# Patient Record
Sex: Female | Born: 1935 | Race: White | Hispanic: No | Smoking: Never smoker
Health system: Southern US, Community
[De-identification: ages and names within clinical notes are randomized; demographics above are authoritative.]

## PROBLEM LIST (undated history)

## (undated) DIAGNOSIS — K219 Gastro-esophageal reflux disease without esophagitis: Secondary | ICD-10-CM

## (undated) DIAGNOSIS — C9110 Chronic lymphocytic leukemia of B-cell type not having achieved remission: Secondary | ICD-10-CM

## (undated) DIAGNOSIS — I10 Essential (primary) hypertension: Secondary | ICD-10-CM

## (undated) DIAGNOSIS — M1990 Unspecified osteoarthritis, unspecified site: Secondary | ICD-10-CM

## (undated) DIAGNOSIS — I509 Heart failure, unspecified: Secondary | ICD-10-CM

## (undated) DIAGNOSIS — N200 Calculus of kidney: Secondary | ICD-10-CM

## (undated) DIAGNOSIS — G2581 Restless legs syndrome: Secondary | ICD-10-CM

## (undated) DIAGNOSIS — R609 Edema, unspecified: Secondary | ICD-10-CM

## (undated) DIAGNOSIS — M797 Fibromyalgia: Secondary | ICD-10-CM

## (undated) HISTORY — PX: TONSILLECTOMY: SUR1361

## (undated) HISTORY — DX: Chronic lymphocytic leukemia of B-cell type not having achieved remission: C91.10

## (undated) HISTORY — PX: EYE SURGERY: SHX253

## (undated) HISTORY — PX: ABDOMINAL HYSTERECTOMY: SHX81

## (undated) HISTORY — DX: Heart failure, unspecified: I50.9

## (undated) HISTORY — PX: LITHOTRIPSY: SUR834

---

## 2004-12-06 ENCOUNTER — Ambulatory Visit: Payer: Self-pay

## 2005-04-25 ENCOUNTER — Ambulatory Visit: Payer: Self-pay | Admitting: Urology

## 2005-04-25 ENCOUNTER — Other Ambulatory Visit: Payer: Self-pay

## 2005-05-11 ENCOUNTER — Ambulatory Visit: Payer: Self-pay | Admitting: Urology

## 2007-11-21 ENCOUNTER — Ambulatory Visit: Payer: Self-pay | Admitting: Family Medicine

## 2008-03-26 ENCOUNTER — Other Ambulatory Visit: Payer: Self-pay

## 2008-03-26 ENCOUNTER — Emergency Department: Payer: Self-pay | Admitting: Emergency Medicine

## 2010-08-02 ENCOUNTER — Ambulatory Visit: Payer: Self-pay | Admitting: Family Medicine

## 2012-04-09 ENCOUNTER — Ambulatory Visit: Payer: Self-pay | Admitting: Ophthalmology

## 2012-04-09 DIAGNOSIS — I10 Essential (primary) hypertension: Secondary | ICD-10-CM

## 2012-04-22 ENCOUNTER — Ambulatory Visit: Payer: Self-pay | Admitting: Ophthalmology

## 2014-03-06 ENCOUNTER — Emergency Department: Payer: Self-pay | Admitting: Emergency Medicine

## 2014-03-06 LAB — BASIC METABOLIC PANEL
Anion Gap: 2 — ABNORMAL LOW (ref 7–16)
BUN: 14 mg/dL (ref 7–18)
CHLORIDE: 107 mmol/L (ref 98–107)
Calcium, Total: 8.9 mg/dL (ref 8.5–10.1)
Co2: 29 mmol/L (ref 21–32)
Creatinine: 0.75 mg/dL (ref 0.60–1.30)
EGFR (African American): >60
Glucose: 103 mg/dL — ABNORMAL HIGH (ref 65–99)
OSMOLALITY: 276 (ref 275–301)
Potassium: 3.6 mmol/L (ref 3.5–5.1)
SODIUM: 138 mmol/L (ref 136–145)

## 2014-03-06 LAB — CBC WITH DIFFERENTIAL/PLATELET
Basophil #: 0.1 10*3/uL (ref 0.0–0.1)
Basophil %: 0.4 %
Eosinophil #: 0.4 10*3/uL (ref 0.0–0.7)
Eosinophil %: 2.8 %
HCT: 42.9 % (ref 35.0–47.0)
HGB: 14.1 g/dL (ref 12.0–16.0)
LYMPHS PCT: 42.1 %
Lymphocyte #: 6.6 10*3/uL — ABNORMAL HIGH (ref 1.0–3.6)
MCH: 29.1 pg (ref 26.0–34.0)
MCHC: 32.9 g/dL (ref 32.0–36.0)
MCV: 88 fL (ref 80–100)
Monocyte #: 1.3 x10 3/mm — ABNORMAL HIGH (ref 0.2–0.9)
Monocyte %: 8.5 %
Neutrophil #: 7.3 10*3/uL — ABNORMAL HIGH (ref 1.4–6.5)
Neutrophil %: 46.2 %
Platelet: 240 10*3/uL (ref 150–440)
RBC: 4.86 10*6/uL (ref 3.80–5.20)
RDW: 12.9 % (ref 11.5–14.5)
WBC: 15.8 10*3/uL — AB (ref 3.6–11.0)

## 2014-03-06 LAB — TROPONIN I

## 2014-03-07 LAB — URINALYSIS, COMPLETE
BACTERIA: NONE SEEN
Bilirubin,UR: NEGATIVE
Blood: NEGATIVE
GLUCOSE, UR: NEGATIVE mg/dL (ref 0–75)
Ketone: NEGATIVE
Leukocyte Esterase: NEGATIVE
Nitrite: NEGATIVE
PH: 7 (ref 4.5–8.0)
PROTEIN: NEGATIVE
Specific Gravity: 1.008 (ref 1.003–1.030)
Squamous Epithelial: NONE SEEN
WBC UR: <1 /HPF (ref 0–5)

## 2015-04-11 NOTE — Op Note (Signed)
PATIENT NAME:  Lindsay [name], Lindsay [name] MR#:  [id number] DATE OF BIRTH:  1934-10-09  DATE OF PROCEDURE:  04/22/2012  PREOPERATIVE DIAGNOSIS:  Cataract, right eye.   POSTOPERATIVE DIAGNOSIS:  Cataract, right eye.  PROCEDURE PERFORMED:  Extracapsular cataract extraction using phacoemulsification with placement of an Alcon SN6CWS 21.5-diopter posterior chamber lens, serial # [id number].  SURGEON:  Loura Back. Tymar Polyak, MD  ASSISTANT:  None.  ANESTHESIA:  4% lidocaine and 0.75% Marcaine in a 50/50 mixture with 10 units/mL of Hylenex added, given as a peribulbar.  ANESTHESIOLOGIST:  Dr. Kayleen Memos   COMPLICATIONS:  None.  ESTIMATED BLOOD LOSS:  Less than 1 mL.  DESCRIPTION OF PROCEDURE:  The patient was brought to the operating room and given a peribulbar block.  The patient was then prepped and draped in the usual fashion.  The vertical rectus muscles were imbricated using 5-0 silk sutures.  These sutures were then clamped to the sterile drapes as bridle sutures.  A limbal peritomy was performed extending two clock hours and hemostasis was obtained with cautery.  A partial thickness scleral groove was made at the surgical limbus and dissected anteriorly in a lamellar dissection using an Alcon crescent knife.  The anterior chamber was entered superonasally with a Superblade and through the lamellar dissection with a 2.6 mm keratome.  DisCoVisc was used to replace the aqueous and a continuous tear capsulorrhexis was carried out.  Hydrodissection and hydrodelineation were carried out with balanced salt and a 27 gauge canula.  The nucleus was rotated to confirm the effectiveness of the hydrodissection.  Phacoemulsification was carried out using a divide-and-conquer technique.  Total ultrasound time was 2 minutes and 39.6 seconds with an average power of 20.2 percent. CDE 60.31.  Irrigation/aspiration was used to remove the residual cortex.  DisCoVisc was used to inflate the capsule and the internal  incision was enlarged to 3 mm with the crescent knife.  The intraocular lens was folded and inserted into the capsular bag using the AcrySert delivery system.  Irrigation/aspiration was used to remove the residual DisCoVisc.  Miostat was injected into the anterior chamber through the paracentesis track to inflate the anterior chamber and induce miosis.  The wound was checked for leaks and none were found. The conjunctiva was closed with cautery and the bridle sutures were removed.  Two drops of 0.3% Vigamox were placed on the eye.   An eye shield was placed on the eye.  The patient was discharged to the recovery room in good condition.  ____________________________ Loura Back Teegan Guinther, MD sad:cms D: 04/22/2012 13:42:56 ET T: 04/22/2012 13:51:04 ET JOB#: [id number]  cc: Remo Lipps A. Monroe Qin, MD, <Dictator> Martie Lee MD ELECTRONICALLY SIGNED 04/26/2012 9:03

## 2016-02-04 ENCOUNTER — Encounter: Payer: Self-pay | Admitting: *Deleted

## 2016-02-07 ENCOUNTER — Ambulatory Visit: Payer: Medicare Other | Admitting: Anesthesiology

## 2016-02-07 ENCOUNTER — Encounter
Admission: RE | Disposition: A | Discharge status: Home / Self Care | Payer: Self-pay | Source: Ambulatory Visit | Attending: Ophthalmology

## 2016-02-07 ENCOUNTER — Encounter: Payer: Self-pay | Admitting: Anesthesiology

## 2016-02-07 ENCOUNTER — Ambulatory Visit
Admission: RE | Admit: 2016-02-07 | Discharge: 2016-02-07 | Disposition: A | Discharge status: Home / Self Care | Payer: Medicare Other | Source: Ambulatory Visit | Attending: Ophthalmology | Admitting: Ophthalmology

## 2016-02-07 DIAGNOSIS — K219 Gastro-esophageal reflux disease without esophagitis: Secondary | ICD-10-CM | POA: Diagnosis not present

## 2016-02-07 DIAGNOSIS — M1990 Unspecified osteoarthritis, unspecified site: Secondary | ICD-10-CM | POA: Diagnosis not present

## 2016-02-07 DIAGNOSIS — Z90710 Acquired absence of both cervix and uterus: Secondary | ICD-10-CM | POA: Insufficient documentation

## 2016-02-07 DIAGNOSIS — H2512 Age-related nuclear cataract, left eye: Secondary | ICD-10-CM | POA: Insufficient documentation

## 2016-02-07 DIAGNOSIS — Z9841 Cataract extraction status, right eye: Secondary | ICD-10-CM | POA: Diagnosis not present

## 2016-02-07 DIAGNOSIS — I10 Essential (primary) hypertension: Secondary | ICD-10-CM | POA: Insufficient documentation

## 2016-02-07 DIAGNOSIS — M25473 Effusion, unspecified ankle: Secondary | ICD-10-CM | POA: Insufficient documentation

## 2016-02-07 DIAGNOSIS — Z87442 Personal history of urinary calculi: Secondary | ICD-10-CM | POA: Insufficient documentation

## 2016-02-07 DIAGNOSIS — G2581 Restless legs syndrome: Secondary | ICD-10-CM | POA: Insufficient documentation

## 2016-02-07 DIAGNOSIS — Z885 Allergy status to narcotic agent status: Secondary | ICD-10-CM | POA: Insufficient documentation

## 2016-02-07 DIAGNOSIS — M797 Fibromyalgia: Secondary | ICD-10-CM | POA: Insufficient documentation

## 2016-02-07 HISTORY — DX: Essential (primary) hypertension: I10

## 2016-02-07 HISTORY — DX: Gastro-esophageal reflux disease without esophagitis: K21.9

## 2016-02-07 HISTORY — DX: Fibromyalgia: M79.7

## 2016-02-07 HISTORY — DX: Unspecified osteoarthritis, unspecified site: M19.90

## 2016-02-07 HISTORY — PX: CATARACT EXTRACTION W/PHACO: SHX586

## 2016-02-07 HISTORY — DX: Edema, unspecified: R60.9

## 2016-02-07 HISTORY — DX: Restless legs syndrome: G25.81

## 2016-02-07 HISTORY — DX: Calculus of kidney: N20.0

## 2016-02-07 SURGERY — PHACOEMULSIFICATION, CATARACT, WITH IOL INSERTION
Anesthesia: Monitor Anesthesia Care | Site: Eye | Laterality: Left | Wound class: Clean

## 2016-02-07 MED ORDER — BSS IO SOLN
INTRAOCULAR | Status: DC | PRN
  Administered 2016-02-07: .1 mL via OPHTHALMIC

## 2016-02-07 MED ORDER — SODIUM CHLORIDE 0.9 % IV SOLN
INTRAVENOUS | Status: DC
  Administered 2016-02-07 (×2): via INTRAVENOUS

## 2016-02-07 MED ORDER — MOXIFLOXACIN HCL 0.5 % OP SOLN
1.0000 [drp] | OPHTHALMIC | Status: AC | PRN
  Administered 2016-02-07 (×3): 1 [drp] via OPHTHALMIC

## 2016-02-07 MED ORDER — LIDOCAINE HCL (PF) 4 % IJ SOLN
INTRAMUSCULAR | Status: AC
  Filled 2016-02-07: qty 5

## 2016-02-07 MED ORDER — EPINEPHRINE HCL 1 MG/ML IJ SOLN
INTRAMUSCULAR | Status: AC
  Filled 2016-02-07: qty 2

## 2016-02-07 MED ORDER — BUPIVACAINE HCL (PF) 0.75 % IJ SOLN
INTRAMUSCULAR | Status: AC
  Filled 2016-02-07: qty 10

## 2016-02-07 MED ORDER — CEFUROXIME OPHTHALMIC INJECTION 1 MG/0.1 ML
INJECTION | OPHTHALMIC | Status: AC
  Filled 2016-02-07: qty 0.1

## 2016-02-07 MED ORDER — TETRACAINE HCL 0.5 % OP SOLN
OPHTHALMIC | Status: DC | PRN
  Administered 2016-02-07: 1 [drp] via OPHTHALMIC

## 2016-02-07 MED ORDER — ALFENTANIL 500 MCG/ML IJ INJ
INJECTION | INTRAMUSCULAR | Status: DC | PRN
  Administered 2016-02-07: 600 ug via INTRAVENOUS

## 2016-02-07 MED ORDER — PHENYLEPHRINE HCL 10 % OP SOLN
1.0000 [drp] | OPHTHALMIC | Status: AC | PRN
  Administered 2016-02-07 (×4): 1 [drp] via OPHTHALMIC

## 2016-02-07 MED ORDER — NA CHONDROIT SULF-NA HYALURON 40-17 MG/ML IO SOLN
INTRAOCULAR | Status: AC
  Filled 2016-02-07: qty 1

## 2016-02-07 MED ORDER — NA CHONDROIT SULF-NA HYALURON 40-17 MG/ML IO SOLN
INTRAOCULAR | Status: DC | PRN
  Administered 2016-02-07: 1 mL via INTRAOCULAR

## 2016-02-07 MED ORDER — EPINEPHRINE HCL 1 MG/ML IJ SOLN
INTRAMUSCULAR | Status: DC | PRN
  Administered 2016-02-07: 1 mL via OPHTHALMIC

## 2016-02-07 MED ORDER — MOXIFLOXACIN HCL 0.5 % OP SOLN
OPHTHALMIC | Status: DC | PRN
  Administered 2016-02-07: 1 [drp] via OPHTHALMIC

## 2016-02-07 MED ORDER — HYALURONIDASE HUMAN 150 UNIT/ML IJ SOLN
INTRAMUSCULAR | Status: AC
  Filled 2016-02-07: qty 1

## 2016-02-07 MED ORDER — CARBACHOL 0.01 % IO SOLN
INTRAOCULAR | Status: DC | PRN
  Administered 2016-02-07: .5 mL via INTRAOCULAR

## 2016-02-07 MED ORDER — MIDAZOLAM HCL 2 MG/2ML IJ SOLN
INTRAMUSCULAR | Status: DC | PRN
  Administered 2016-02-07: 1 mg via INTRAVENOUS

## 2016-02-07 MED ORDER — CEFUROXIME OPHTHALMIC INJECTION 1 MG/0.1 ML
INJECTION | OPHTHALMIC | Status: DC | PRN
  Administered 2016-02-07: .1 mL via INTRACAMERAL

## 2016-02-07 MED ORDER — CYCLOPENTOLATE HCL 2 % OP SOLN
1.0000 [drp] | OPHTHALMIC | Status: AC | PRN
  Administered 2016-02-07 (×4): 1 [drp] via OPHTHALMIC

## 2016-02-07 MED ORDER — LIDOCAINE HCL (PF) 4 % IJ SOLN
INTRAMUSCULAR | Status: DC | PRN
  Administered 2016-02-07: 4 mL via OPHTHALMIC

## 2016-02-07 MED ORDER — TETRACAINE HCL 0.5 % OP SOLN
OPHTHALMIC | Status: AC
  Filled 2016-02-07: qty 2

## 2016-02-07 SURGICAL SUPPLY — 29 items
CANNULA ANT/CHMB 27GA (MISCELLANEOUS) ×2 IMPLANT
CORD BIP STRL DISP 12FT (MISCELLANEOUS) ×2 IMPLANT
CUP MEDICINE 2OZ PLAST GRAD ST (MISCELLANEOUS) ×2 IMPLANT
DRAPE XRAY CASSETTE 23X24 (DRAPES) ×2 IMPLANT
ERASER HMR WETFIELD 18G (MISCELLANEOUS) ×2 IMPLANT
GLOVE BIO SURGEON STRL SZ8 (GLOVE) ×2 IMPLANT
GLOVE SURG LX 6.5 MICRO (GLOVE) ×1
GLOVE SURG LX 8.0 MICRO (GLOVE) ×1
GLOVE SURG LX STRL 6.5 MICRO (GLOVE) ×1 IMPLANT
GLOVE SURG LX STRL 8.0 MICRO (GLOVE) ×1 IMPLANT
GOWN STRL REUS W/ TWL LRG LVL3 (GOWN DISPOSABLE) ×1 IMPLANT
GOWN STRL REUS W/ TWL XL LVL3 (GOWN DISPOSABLE) ×1 IMPLANT
GOWN STRL REUS W/TWL LRG LVL3 (GOWN DISPOSABLE) ×1
GOWN STRL REUS W/TWL XL LVL3 (GOWN DISPOSABLE) ×1
LENS IOL ACRYSOF IQ 21.0 (Intraocular Lens) ×2 IMPLANT
PACK CATARACT (MISCELLANEOUS) ×2 IMPLANT
PACK CATARACT DINGLEDEIN LX (MISCELLANEOUS) ×2 IMPLANT
PACK EYE AFTER SURG (MISCELLANEOUS) ×2 IMPLANT
SHLD EYE VISITEC  UNIV (MISCELLANEOUS) ×2 IMPLANT
SOL BSS BAG (MISCELLANEOUS) ×2
SOL PREP PVP 2OZ (MISCELLANEOUS) ×2
SOLUTION BSS BAG (MISCELLANEOUS) ×1 IMPLANT
SOLUTION PREP PVP 2OZ (MISCELLANEOUS) ×1 IMPLANT
SUT SILK 5-0 (SUTURE) ×2 IMPLANT
SYR 3ML LL SCALE MARK (SYRINGE) ×2 IMPLANT
SYR 5ML LL (SYRINGE) ×2 IMPLANT
SYR TB 1ML 27GX1/2 LL (SYRINGE) ×2 IMPLANT
WATER STERILE IRR 1000ML POUR (IV SOLUTION) ×2 IMPLANT
WIPE NON LINTING 3.25X3.25 (MISCELLANEOUS) ×2 IMPLANT

## 2016-02-07 NOTE — H&P (Signed)
See scanned note.

## 2016-02-07 NOTE — Transfer of Care (Signed)
Immediate Anesthesia Transfer of Care Note  Patient: Lindsay [name]  Procedure(s) Performed: Procedure(s) with comments: CATARACT EXTRACTION PHACO AND INTRAOCULAR LENS PLACEMENT (IOC) (Left) - [place] 01:33 AP% 24.7 CDE 39.57 fluid pack lot # [id number]  Patient Location: PACU  Anesthesia Type:MAC  Level of Consciousness: awake  Airway & Oxygen Therapy: Patient Spontanous Breathing  Post-op Assessment: Report given to RN  Post vital signs: Reviewed and stable  Last Vitals:  Filed Vitals:   02/07/16 0737  BP: 178/56  Pulse: 70  Temp: 35.8 C  Resp: 16    Complications: No apparent anesthesia complications

## 2016-02-07 NOTE — Interval H&P Note (Signed)
History and Physical Interval Note:  02/07/2016 8:58 AM  Lindsay [name]  has presented today for surgery, with the diagnosis of cataract  The various methods of treatment have been discussed with the patient and family. After consideration of risks, benefits and other options for treatment, the patient has consented to  Procedure(s): CATARACT EXTRACTION PHACO AND INTRAOCULAR LENS PLACEMENT ([place]) (Left) as a surgical intervention .  The patient's history has been reviewed, patient examined, no change in status, stable for surgery.  I have reviewed the patient's chart and labs.  Questions were answered to the patient's satisfaction.     Aldridge Krzyzanowski

## 2016-02-07 NOTE — Anesthesia Preprocedure Evaluation (Signed)
Anesthesia Evaluation  Patient identified by MRN, date of birth, ID band Patient awake    Reviewed: Allergy & Precautions, H&P , NPO status , Patient's Chart, lab work & pertinent test results  History of Anesthesia Complications Negative for: history of anesthetic complications  Airway Mallampati: III  TM Distance: >3 FB Neck ROM: full    Dental  (+) Poor Dentition   Pulmonary neg pulmonary ROS, neg shortness of breath,    Pulmonary exam normal breath sounds clear to auscultation       Cardiovascular Exercise Tolerance: Good hypertension, (-) angina(-) DOE Normal cardiovascular exam Rhythm:regular Rate:Normal     Neuro/Psych  Neuromuscular disease negative psych ROS   GI/Hepatic Neg liver ROS, GERD  Controlled,  Endo/Other  negative endocrine ROS  Renal/GU Renal disease  negative genitourinary   Musculoskeletal  (+) Arthritis , Fibromyalgia -  Abdominal   Peds  Hematology negative hematology ROS (+)   Anesthesia Other Findings Past Medical History:   Hypertension                                                 GERD (gastroesophageal reflux disease)                       Kidney stones                                                Arthritis                                                      Comment:RHEUMATOID   Fibromyalgia                                                 RLS (restless legs syndrome)                                 Edema                                                          Comment:MILD ANKLE OCCAS  Past Surgical History:   EYE SURGERY                                                   ABDOMINAL HYSTERECTOMY                                        TONSILLECTOMY  LITHOTRIPSY                                                  BMI    Body Mass Index   25.51 kg/m 2      Reproductive/Obstetrics negative OB ROS                              Anesthesia Physical Anesthesia Plan  ASA: III  Anesthesia Plan: MAC   Post-op Pain Management:    Induction:   Airway Management Planned:   Additional Equipment:   Intra-op Plan:   Post-operative Plan:   Informed Consent: I have reviewed the patients History and Physical, chart, labs and discussed the procedure including the risks, benefits and alternatives for the proposed anesthesia with the patient or authorized representative who has indicated his/her understanding and acceptance.   Dental Advisory Given  Plan Discussed with: Anesthesiologist, CRNA and Surgeon  Anesthesia Plan Comments:         Anesthesia Quick Evaluation

## 2016-02-07 NOTE — Anesthesia Postprocedure Evaluation (Signed)
Anesthesia Post Note  Patient: Lindsay [name]  Procedure(s) Performed: Procedure(s) (LRB): CATARACT EXTRACTION PHACO AND INTRAOCULAR LENS PLACEMENT (IOC) (Left)  Patient location during evaluation: Short Stay Anesthesia Type: MAC Level of consciousness: awake Pain management: pain level controlled Vital Signs Assessment: post-procedure vital signs reviewed and stable Respiratory status: spontaneous breathing Cardiovascular status: blood pressure returned to baseline    Last Vitals:  Filed Vitals:   02/07/16 0737  BP: 178/56  Pulse: 70  Temp: 35.8 C  Resp: 16    Last Pain: There were no vitals filed for this visit.               Buckner Malta

## 2016-02-07 NOTE — Anesthesia Procedure Notes (Addendum)
Procedure Name: MAC Date/Time: 02/07/2016 9:15 AM Performed by: Allean Found Pre-anesthesia Checklist: Patient identified, Emergency Drugs available, Suction available, Patient being monitored and Timeout performed Patient Re-evaluated:Patient Re-evaluated prior to inductionOxygen Delivery Method: Nasal cannula Intubation Type: IV induction

## 2016-02-07 NOTE — Discharge Instructions (Signed)
AMBULATORY SURGERY  DISCHARGE INSTRUCTIONS   1) The drugs that you were given will stay in your system until tomorrow so for the next 24 hours you should not:  A) Drive an automobile B) Make any legal decisions C) Drink any alcoholic beverage   2) You may resume regular meals tomorrow.  Today it is better to start with liquids and gradually work up to solid foods.  You may eat anything you prefer, but it is better to start with liquids, then soup and crackers, and gradually work up to solid foods.   3) Please notify your doctor immediately if you have any unusual bleeding, trouble breathing, redness and pain at the surgery site, drainage, fever, or pain not relieved by medication.    4) Additional Instructions:    Eye Surgery Discharge Instructions  Expect mild scratchy sensation or mild soreness. DO NOT RUB YOUR EYE!  The day of surgery:  Minimal physical activity, but bed rest is not required  No reading, computer work, or close hand work  No bending, lifting, or straining.  May watch TV  For 24 hours:  No driving, legal decisions, or alcoholic beverages  Safety precautions  Eat anything you prefer: It is better to start with liquids, then soup then solid foods.  _____ Eye patch should be worn until postoperative exam tomorrow.  ____ Solar shield eyeglasses should be worn for comfort in the sunlight/patch while sleeping  Resume all regular medications including aspirin or Coumadin if these were discontinued prior to surgery. You may shower, bathe, shave, or wash your hair. Tylenol may be taken for mild discomfort.  Call your doctor if you experience significant pain, nausea, or vomiting, fever > 101 or other signs of infection. [phone] or [phone] Specific instructions:  Follow-up Information    Follow up with Champagne Paletta, MD In 1 day.   Specialty:  Ophthalmology   Why:  February 20 at 10:50   Contact information:   [address]   [place] [address] [phone]         Please contact your physician with any problems or Same Day Surgery at [phone], Monday through Friday 6 am to 4 pm, or [place] at [facility] number at [phone].

## 2016-02-07 NOTE — Op Note (Signed)
Date of Surgery: 02/07/2016 Date of Dictation: 02/07/2016 9:44 AM Pre-operative Diagnosis:  Nuclear Sclerotic Cataract left Eye Post-operative Diagnosis: same Procedure performed: Extra-capsular Cataract Extraction (ECCE) with placement of a posterior chamber intraocular lens (IOL) left Eye IOL:  Implant Name Type Inv. Item Serial No. Manufacturer Lot No. LRB No. Used  LENS IOL ACRYSOF IQ 21.0 - [id number] Intraocular Lens LENS IOL ACRYSOF IQ 21.0 [id number] ALCON   Left 1   Anesthesia: 2% Lidocaine and 4% Marcaine in a 50/50 mixture with 10 unites/ml of Hylenex given as a peribulbar Anesthesiologist: Anesthesiologist: Andria Frames, MD CRNA: Allean Found, CRNA Complications: none Estimated Blood Loss: less than 1 ml  Description of procedure:  The patient was given anesthesia and sedation via intravenous access. The patient was then prepped and draped in the usual fashion. A 25-gauge needle was bent for initiating the capsulorhexis. A 5-0 silk suture was placed through the conjunctiva superior and inferiorly to serve as bridle sutures. Hemostasis was obtained at the superior limbus using an eraser cautery. A partial thickness groove was made at the anterior surgical limbus with a 64 Beaver blade and this was dissected anteriorly with an [employer]. The anterior chamber was entered at 10 o'clock with a 1.0 mm paracentesis knife and through the lamellar dissection with a 2.6 mm Alcon keratome. Epi-Shugarcaine 0.5 CC [9 cc BSS Plus (Alcon), 3 cc 4% preservative-free lidocaine (Hospira) and 4 cc 1:1000 preservative-free, bisulfite-free epinephrine] was injected into the anterior chamber via the paracentesis tract. Epi-Shugarcaine 0.5 CC [9 cc BSS Plus (Alcon), 3 cc 4% preservative-free lidocaine (Hospira) and 4 cc 1:1000 preservative-free, bisulfite-free epinephrine] was injected into the anterior chamber via the paracentesis tract. DiscoVisc was injected to replace the aqueous and a  continuous tear curvilinear capsulorhexis was performed using a bent 25-gauge needle.  Balance salt on a syringe was used to perform hydro-dissection and phacoemulsification was carried out using a divide and conquer technique. Procedure(s) with comments: CATARACT EXTRACTION PHACO AND INTRAOCULAR LENS PLACEMENT (IOC) (Left) - [place] 01:33 AP% 24.7 CDE 39.57 fluid pack lot # [id number]. Irrigation/aspiration was used to remove the residual cortex and the capsular bag was inflated with DiscoVisc. The intraocular lens was inserted into the capsular bag using a pre-loaded UltraSert Delivery System. Irrigation/aspiration was used to remove the residual DiscoVisc. The wound was inflated with balanced salt and checked for leaks. None were found. Miostat was injected via the paracentesis track and 0.1 ml of cefuroxime containing 1 mg of drug  was injected via the paracentesis track. The wound was checked for leaks again and none were found.   The bridal sutures were removed and two drops of Vigamox were placed on the eye. An eye shield was placed to protect the eye and the patient was discharged to the recovery area in good condition.   Gagan Dillion MD

## 2016-02-11 NOTE — Addendum Note (Signed)
Addendum  created 02/11/16 1118 by Andria Frames, MD   Modules edited: Anesthesia Responsible Staff

## 2016-09-15 ENCOUNTER — Other Ambulatory Visit: Payer: Self-pay | Admitting: Family Medicine

## 2016-09-15 DIAGNOSIS — Z1231 Encounter for screening mammogram for malignant neoplasm of breast: Secondary | ICD-10-CM

## 2016-09-28 ENCOUNTER — Ambulatory Visit: Payer: Medicare Other | Attending: Family Medicine

## 2018-05-17 ENCOUNTER — Encounter: Payer: Medicare Other | Admitting: Oncology

## 2018-05-23 ENCOUNTER — Encounter: Payer: Medicare Other | Admitting: Oncology

## 2019-07-03 ENCOUNTER — Encounter
Admission: RE | Disposition: A | Discharge status: Home / Self Care | Payer: Self-pay | Source: Home / Self Care | Attending: Urology

## 2019-07-03 ENCOUNTER — Ambulatory Visit
Admission: RE | Admit: 2019-07-03 | Discharge: 2019-07-03 | Disposition: A | Discharge status: Home / Self Care | Payer: Medicare Other | Attending: Urology | Admitting: Urology

## 2019-07-03 ENCOUNTER — Other Ambulatory Visit: Payer: Self-pay

## 2019-07-03 DIAGNOSIS — Z79891 Long term (current) use of opiate analgesic: Secondary | ICD-10-CM | POA: Diagnosis not present

## 2019-07-03 DIAGNOSIS — Z79899 Other long term (current) drug therapy: Secondary | ICD-10-CM | POA: Insufficient documentation

## 2019-07-03 DIAGNOSIS — I10 Essential (primary) hypertension: Secondary | ICD-10-CM | POA: Insufficient documentation

## 2019-07-03 DIAGNOSIS — N200 Calculus of kidney: Secondary | ICD-10-CM | POA: Insufficient documentation

## 2019-07-03 HISTORY — PX: EXTRACORPOREAL SHOCK WAVE LITHOTRIPSY: SHX1557

## 2019-07-03 SURGERY — LITHOTRIPSY, ESWL
Anesthesia: Moderate Sedation | Laterality: Right

## 2019-07-03 MED ORDER — MORPHINE SULFATE (PF) 10 MG/ML IV SOLN
INTRAVENOUS | Status: AC
  Administered 2019-07-03: 10 mg
  Filled 2019-07-03: qty 1

## 2019-07-03 MED ORDER — FUROSEMIDE 10 MG/ML IJ SOLN
INTRAMUSCULAR | Status: AC
  Filled 2019-07-03: qty 2

## 2019-07-03 MED ORDER — TAMSULOSIN HCL 0.4 MG PO CAPS: 0.4000 mg | ORAL_CAPSULE | Freq: Every day | ORAL | 1 refills | Status: DC

## 2019-07-03 MED ORDER — LEVOFLOXACIN 500 MG PO TABS
500.0000 mg | ORAL_TABLET | ORAL | Status: AC
  Administered 2019-07-03: 12:00:00 500 mg via ORAL

## 2019-07-03 MED ORDER — DIPHENHYDRAMINE HCL 25 MG PO CAPS
25.0000 mg | ORAL_CAPSULE | ORAL | Status: AC
  Administered 2019-07-03: 12:00:00 25 mg via ORAL

## 2019-07-03 MED ORDER — PROMETHAZINE HCL 25 MG/ML IJ SOLN
INTRAMUSCULAR | Status: AC
  Administered 2019-07-03: 25 mg via INTRAMUSCULAR
  Filled 2019-07-03: qty 1

## 2019-07-03 MED ORDER — DIPHENHYDRAMINE HCL 25 MG PO CAPS
ORAL_CAPSULE | ORAL | Status: AC
  Administered 2019-07-03: 25 mg via ORAL
  Filled 2019-07-03: qty 1

## 2019-07-03 MED ORDER — LEVOFLOXACIN 500 MG PO TABS
ORAL_TABLET | ORAL | Status: AC
  Administered 2019-07-03: 500 mg via ORAL
  Filled 2019-07-03: qty 1

## 2019-07-03 MED ORDER — MIDAZOLAM HCL 2 MG/2ML IJ SOLN
INTRAMUSCULAR | Status: AC
  Administered 2019-07-03: 1 mg via INTRAMUSCULAR
  Filled 2019-07-03: qty 2

## 2019-07-03 MED ORDER — MORPHINE SULFATE (PF) 2 MG/ML IV SOLN
10.0000 mg | Freq: Once | INTRAVENOUS | Status: DC
  Filled 2019-07-03: qty 5

## 2019-07-03 MED ORDER — PROMETHAZINE HCL 25 MG/ML IJ SOLN
25.0000 mg | Freq: Once | INTRAMUSCULAR | Status: AC
  Administered 2019-07-03: 25 mg via INTRAMUSCULAR

## 2019-07-03 MED ORDER — FUROSEMIDE 10 MG/ML IJ SOLN
10.0000 mg | Freq: Once | INTRAMUSCULAR | Status: AC
  Administered 2019-07-03: 10 mg via INTRAVENOUS

## 2019-07-03 MED ORDER — MIDAZOLAM HCL 2 MG/2ML IJ SOLN
1.0000 mg | Freq: Once | INTRAMUSCULAR | Status: AC
  Administered 2019-07-03: 1 mg via INTRAMUSCULAR

## 2019-07-03 MED ORDER — DEXTROSE-NACL 5-0.45 % IV SOLN
INTRAVENOUS | Status: DC
  Administered 2019-07-03: 12:00:00 via INTRAVENOUS

## 2019-07-03 NOTE — Discharge Instructions (Addendum)
Kidney Stones  Kidney stones (urolithiasis) are solid, rock-like deposits that form inside of the organs that make urine (kidneys). A kidney stone may form in a kidney and move into the bladder, where it can cause intense pain and block the flow of urine. Kidney stones are created when high levels of certain minerals are found in the urine. They are usually passed through urination, but in some cases, medical treatment may be needed to remove them. What are the causes? Kidney stones may be caused by:  A condition in which certain glands produce too much parathyroid hormone (primary hyperparathyroidism), which causes too much calcium buildup in the blood.  Buildup of uric acid crystals in the bladder (hyperuricosuria). Uric acid is a chemical that the body produces when you eat certain foods. It usually exits the body in the urine.  Narrowing (stricture) of one or both of the tubes that drain urine from the kidneys to the bladder (ureters).  A kidney blockage that is present at birth (congenital obstruction).  Past surgery on the kidney or the ureters, such as gastric bypass surgery. What increases the risk? The following factors make you more likely to develop kidney stones:  Having had a kidney stone in the past.  Having a family history of kidney stones.  Not drinking enough water.  Eating a diet that is high in protein, salt (sodium), or sugar.  Being overweight or obese. What are the signs or symptoms? Symptoms of a kidney stone may include:  Nausea.  Vomiting.  Blood in the urine (hematuria).  Pain in the side of the abdomen, right below the ribs (flank pain). Pain usually spreads (radiates) to the groin.  Needing to urinate frequently or urgently. How is this diagnosed? This condition may be diagnosed based on:  Your medical history.  A physical exam.  Blood tests.  Urine tests.  CT scan.  Abdominal X-ray.  A procedure to examine the inside of the bladder  (cystoscopy). How is this treated? Treatment for kidney stones depends on the size, location, and makeup of the stones. Treatment may involve:  Analyzing your urine before and after you pass the stone through urination.  Being monitored at the hospital until you pass the stone through urination.  Increasing your fluid intake and decreasing the amount of calcium and protein in your diet.  A procedure to break up kidney stones in the bladder using: ? A focused beam of light (laser therapy). ? Shock waves (extracorporeal shock wave lithotripsy).  Surgery to remove kidney stones. This may be needed if you have severe pain or have stones that block your urinary tract. Follow these instructions at home: Eating and drinking  Drink enough fluid to keep your urine clear or pale yellow. This will help you to pass the kidney stone.  If directed, change your diet. This may include: ? Limiting how much sodium you eat. ? Eating more fruits and vegetables. ? Limiting how much meat, poultry, fish, and eggs you eat.  Follow instructions from your health care provider about eating or drinking restrictions. General instructions  Collect urine samples as told by your health care provider. You may need to collect a urine sample: ? 24 hours after you pass the stone. ? 8-12 weeks after passing the kidney stone, and every 6-12 months after that.  Strain your urine every time you urinate, for as long as directed. Use the strainer that your health care provider recommends.  Do not throw out the kidney stone after passing  it. Keep the stone so it can be tested by your health care provider. Testing the makeup of your kidney stone may help prevent you from getting kidney stones in the future.  Take over-the-counter and prescription medicines only as told by your health care provider.  Keep all follow-up visits as told by your health care provider. This is important. You may need follow-up X-rays or  ultrasounds to make sure that your stone has passed. How is this prevented? To prevent another kidney stone:  Drink enough fluid to keep your urine clear or pale yellow. This is the best way to prevent kidney stones.  Eat a healthy diet and follow recommendations from your health care provider about foods to avoid. You may be instructed to eat a low-protein diet. Recommendations vary depending on the type of kidney stone that you have.  Maintain a healthy weight. Contact a health care provider if:  You have pain that gets worse or does not get better with medicine. Get help right away if:  You have a fever or chills.  You develop severe pain.  You develop new abdominal pain.  You faint.  You are unable to urinate. This information is not intended to replace advice given to you by your health care provider. Make sure you discuss any questions you have with your health care provider. Document Released: 12/04/2005 Document Revised: 07/16/2018 Document Reviewed: 05/19/2016 Elsevier Patient Education  2020 [place] After This sheet gives you information about how to care for yourself after your procedure. Your health care provider may also give you more specific instructions. If you have problems or questions, contact your health care provider. What can I expect after the procedure? After the procedure, it is common to have:  Some blood in your urine. This should only last for a few days.  Soreness in your back, sides, or upper abdomen for a few days.  Blotches or bruises on your back where the pressure wave entered the skin.  Pain, discomfort, or nausea when pieces (fragments) of the kidney stone move through the tube that carries urine from the kidney to the bladder (ureter). Stone fragments may pass soon after the procedure, but they may continue to pass for up to 4-8 weeks. ? If you have severe pain or nausea, contact your health care provider. This may  be caused by a large stone that was not broken up, and this may mean that you need more treatment.  Some pain or discomfort during urination.  Some pain or discomfort in the lower abdomen or (in men) at the base of the penis. Follow these instructions at home: Medicines  Take over-the-counter and prescription medicines only as told by your health care provider.  If you were prescribed an antibiotic medicine, take it as told by your health care provider. Do not stop taking the antibiotic even if you start to feel better.  Do not drive for 24 hours if you were given a medicine to help you relax (sedative).  Do not drive or use heavy machinery while taking prescription pain medicine. Eating and drinking      Drink enough water and fluids to keep your urine clear or pale yellow. This helps any remaining pieces of the stone to pass. It can also help prevent new stones from forming.  Eat plenty of fresh fruits and vegetables.  Follow instructions from your health care provider about eating and drinking restrictions. You may be instructed: ? To reduce how much  salt (sodium) you eat or drink. Check ingredients and nutrition facts on packaged foods and beverages. ? To reduce how much meat you eat.  Eat the recommended amount of calcium for your age and gender. Ask your health care provider how much calcium you should have. General instructions  Get plenty of rest.  Most people can resume normal activities 1-2 days after the procedure. Ask your health care provider what activities are safe for you.  Your health care provider may direct you to lie in a certain position (postural drainage) and tap firmly (percuss) over your kidney area to help stone fragments pass. Follow instructions as told by your health care provider.  If directed, strain all urine through the strainer that was provided by your health care provider. ? Keep all fragments for your health care provider to see. Any stones  that are found may be sent to a medical lab for examination. The stone may be as small as a grain of salt.  Keep all follow-up visits as told by your health care provider. This is important. Contact a health care provider if:  You have pain that is severe or does not get better with medicine.  You have nausea that is severe or does not go away.  You have blood in your urine longer than your health care provider told you to expect.  You have more blood in your urine.  You have pain during urination that does not go away.  You urinate more frequently than usual and this does not go away.  You develop a rash or any other possible signs of an allergic reaction. Get help right away if:  You have severe pain in your back, sides, or upper abdomen.  You have severe pain while urinating.  Your urine is very dark red.  You have blood in your stool (feces).  You cannot pass any urine at all.  You feel a strong urge to urinate after emptying your bladder.  You have a fever or chills.  You develop shortness of breath, difficulty breathing, or chest pain.  You have severe nausea that leads to persistent vomiting.  You faint. Summary  After this procedure, it is common to have some pain, discomfort, or nausea when pieces (fragments) of the kidney stone move through the tube that carries urine from the kidney to the bladder (ureter). If this pain or nausea is severe, however, you should contact your health care provider.  Most people can resume normal activities 1-2 days after the procedure. Ask your health care provider what activities are safe for you.  Drink enough water and fluids to keep your urine clear or pale yellow. This helps any remaining pieces of the stone to pass, and it can help prevent new stones from forming.  If directed, strain your urine and keep all fragments for your health care provider to see. Fragments or stones may be as small as a grain of salt.  Get help  right away if you have severe pain in your back, sides, or upper abdomen or have severe pain while urinating. This information is not intended to replace advice given to you by your health care provider. Make sure you discuss any questions you have with your health care provider. Document Released: 12/24/2007 Document Revised: 03/17/2019 Document Reviewed: 10/25/2016 Elsevier Patient Education  2020 [place]   1) The drugs that you were given will stay in your system until tomorrow so for the next  24 hours you should not:  A) Drive an automobile B) Make any legal decisions C) Drink any alcoholic beverage   2) You may resume regular meals tomorrow.  Today it is better to start with liquids and gradually work up to solid foods.  You may eat anything you prefer, but it is better to start with liquids, then soup and crackers, and gradually work up to solid foods.   3) Please notify your doctor immediately if you have any unusual bleeding, trouble breathing, redness and pain at the surgery site, drainage, fever, or pain not relieved by medication.    4) Additional Instructions:   FOLLOW [facility] DISCHARGE INSTRUCTION SHEET AS REVIEWED.        Please contact your physician with any problems or Same Day Surgery at [phone], Monday through Friday 6 am to 4 pm, or [place] [facility] number at [phone].

## 2019-07-03 NOTE — OR Nursing (Signed)
Dr Yves Dill called per Nigel Bridgeman RN, discussed patient's age, weight and allergies along with medications ordered.  No change in orders per Dr Yves Dill. Called daughter and given update.

## 2019-07-04 ENCOUNTER — Encounter: Payer: Self-pay | Admitting: Urology

## 2020-03-10 ENCOUNTER — Other Ambulatory Visit: Payer: Self-pay

## 2020-03-10 ENCOUNTER — Encounter: Payer: Self-pay | Admitting: Podiatry

## 2020-03-10 ENCOUNTER — Ambulatory Visit: Payer: Medicare Other | Admitting: Podiatry

## 2020-03-10 VITALS — Temp 97.6°F

## 2020-03-10 DIAGNOSIS — K5790 Diverticulosis of intestine, part unspecified, without perforation or abscess without bleeding: Secondary | ICD-10-CM | POA: Insufficient documentation

## 2020-03-10 DIAGNOSIS — M1990 Unspecified osteoarthritis, unspecified site: Secondary | ICD-10-CM | POA: Insufficient documentation

## 2020-03-10 DIAGNOSIS — M797 Fibromyalgia: Secondary | ICD-10-CM | POA: Insufficient documentation

## 2020-03-10 DIAGNOSIS — N200 Calculus of kidney: Secondary | ICD-10-CM | POA: Insufficient documentation

## 2020-03-10 DIAGNOSIS — B351 Tinea unguium: Secondary | ICD-10-CM | POA: Diagnosis not present

## 2020-03-10 DIAGNOSIS — M79676 Pain in unspecified toe(s): Secondary | ICD-10-CM | POA: Diagnosis not present

## 2020-03-10 DIAGNOSIS — K589 Irritable bowel syndrome without diarrhea: Secondary | ICD-10-CM | POA: Insufficient documentation

## 2020-03-10 NOTE — Progress Notes (Signed)
  Subjective:  Patient ID: Lindsay [name], female    DOB: October 31, 1934,  MRN: [id number] HPI Chief Complaint  Patient presents with  . Nail Problem    Toenails (mainly hallux nails bilateral) - thick and discolored x years, can't cut herself, tried using Kerasal OTC antifungal solution-no help  . New Patient (Initial Visit)    86 y.o. female presents with the above complaint.   ROS: Denies with fever chills nausea vomiting muscle aches pains calf pain back pain chest pain shortness of breath.  Past Medical History:  Diagnosis Date  . Arthritis    RHEUMATOID  . Edema    MILD ANKLE OCCAS  . Fibromyalgia   . GERD (gastroesophageal reflux disease)   . Hypertension   . Kidney stones   . RLS (restless legs syndrome)    Past Surgical History:  Procedure Laterality Date  . ABDOMINAL HYSTERECTOMY    . CATARACT EXTRACTION W/PHACO Left 02/07/2016   Procedure: CATARACT EXTRACTION PHACO AND INTRAOCULAR LENS PLACEMENT (IOC);  Surgeon: Estill Cotta, MD;  Location: [facility] ORS;  Service: Ophthalmology;  Laterality: Left;  [place] 01:33 AP% 24.7 CDE 39.57 fluid pack lot # [id number]  . EXTRACORPOREAL SHOCK WAVE LITHOTRIPSY Right 07/03/2019   Procedure: EXTRACORPOREAL SHOCK WAVE LITHOTRIPSY (ESWL);  Surgeon: Royston Cowper, MD;  Location: [facility] ORS;  Service: Urology;  Laterality: Right;  . EYE SURGERY    . LITHOTRIPSY    . TONSILLECTOMY      Current Outpatient Medications:  .  amitriptyline (ELAVIL) 10 MG tablet, Take 20 mg by mouth at bedtime., Disp: , Rfl:  .  amLODipine (NORVASC) 10 MG tablet, Take 10 mg by mouth daily., Disp: , Rfl:  .  HYDROcodone-acetaminophen (NORCO/VICODIN) 5-325 MG tablet, Take 1 tablet by mouth daily., Disp: , Rfl:   Allergies  Allergen Reactions  . Demerol [Meperidine]   . Septra [Sulfamethoxazole-Trimethoprim]    Review of Systems Objective:   Vitals:   03/10/20 1121  Temp: 97.6 F (36.4 C)    General: Well developed, nourished, in no acute distress,  alert and oriented x3   Dermatological: Skin is warm, dry and supple bilateral. Nails x 10 are well maintained; remaining integument appears unremarkable at this time. There are no open sores, no preulcerative lesions, no rash or signs of infection present.  Thick painful hallux nails bilaterally the remainder of the nails appear to be normal.  Vascular: Dorsalis Pedis artery and Posterior Tibial artery pedal pulses are 2/4 bilateral with immedate capillary fill time. Pedal hair growth present. No varicosities and no lower extremity edema present bilateral.   Neruologic: Grossly intact via light touch bilateral. Vibratory intact via tuning fork bilateral. Protective threshold with Semmes Wienstein monofilament intact to all pedal sites bilateral. Patellar and Achilles deep tendon reflexes 2+ bilateral. No Babinski or clonus noted bilateral.   Musculoskeletal: No gross boney pedal deformities bilateral. No pain, crepitus, or limitation noted with foot and ankle range of motion bilateral. Muscular strength 5/5 in all groups tested bilateral.  Gait: Unassisted, Nonantalgic.    Radiographs:  None taken  Assessment & Plan:   Assessment: Nail dystrophy  Plan: Debridement of nails 1 through 5 bilateral.  Follow-up as needed     Moncerrat Burnstein T. [place], [place]

## 2020-04-19 ENCOUNTER — Other Ambulatory Visit: Payer: Self-pay

## 2020-04-19 ENCOUNTER — Inpatient Hospital Stay
Admission: EM | Admit: 2020-04-19 | Discharge: 2020-04-22 | DRG: 522 | Disposition: A | Discharge status: Home Health | Payer: Medicare Other | Attending: Internal Medicine | Admitting: Internal Medicine

## 2020-04-19 ENCOUNTER — Emergency Department: Payer: Medicare Other

## 2020-04-19 ENCOUNTER — Encounter: Payer: Self-pay | Admitting: Emergency Medicine

## 2020-04-19 DIAGNOSIS — Z01818 Encounter for other preprocedural examination: Secondary | ICD-10-CM

## 2020-04-19 DIAGNOSIS — K219 Gastro-esophageal reflux disease without esophagitis: Secondary | ICD-10-CM | POA: Diagnosis present

## 2020-04-19 DIAGNOSIS — D62 Acute posthemorrhagic anemia: Secondary | ICD-10-CM | POA: Diagnosis not present

## 2020-04-19 DIAGNOSIS — G2581 Restless legs syndrome: Secondary | ICD-10-CM | POA: Diagnosis present

## 2020-04-19 DIAGNOSIS — M069 Rheumatoid arthritis, unspecified: Secondary | ICD-10-CM | POA: Diagnosis present

## 2020-04-19 DIAGNOSIS — K580 Irritable bowel syndrome with diarrhea: Secondary | ICD-10-CM | POA: Diagnosis not present

## 2020-04-19 DIAGNOSIS — W19XXXD Unspecified fall, subsequent encounter: Secondary | ICD-10-CM | POA: Diagnosis not present

## 2020-04-19 DIAGNOSIS — M797 Fibromyalgia: Secondary | ICD-10-CM | POA: Diagnosis present

## 2020-04-19 DIAGNOSIS — W1830XA Fall on same level, unspecified, initial encounter: Secondary | ICD-10-CM | POA: Diagnosis present

## 2020-04-19 DIAGNOSIS — Z79899 Other long term (current) drug therapy: Secondary | ICD-10-CM | POA: Diagnosis not present

## 2020-04-19 DIAGNOSIS — Z66 Do not resuscitate: Secondary | ICD-10-CM | POA: Diagnosis present

## 2020-04-19 DIAGNOSIS — Z20822 Contact with and (suspected) exposure to covid-19: Secondary | ICD-10-CM | POA: Diagnosis present

## 2020-04-19 DIAGNOSIS — Z419 Encounter for procedure for purposes other than remedying health state, unspecified: Secondary | ICD-10-CM

## 2020-04-19 DIAGNOSIS — S72002K Fracture of unspecified part of neck of left femur, subsequent encounter for closed fracture with nonunion: Secondary | ICD-10-CM | POA: Diagnosis not present

## 2020-04-19 DIAGNOSIS — Z888 Allergy status to other drugs, medicaments and biological substances status: Secondary | ICD-10-CM | POA: Diagnosis not present

## 2020-04-19 DIAGNOSIS — K589 Irritable bowel syndrome without diarrhea: Secondary | ICD-10-CM | POA: Diagnosis present

## 2020-04-19 DIAGNOSIS — S72002A Fracture of unspecified part of neck of left femur, initial encounter for closed fracture: Secondary | ICD-10-CM | POA: Diagnosis present

## 2020-04-19 DIAGNOSIS — W19XXXA Unspecified fall, initial encounter: Secondary | ICD-10-CM

## 2020-04-19 DIAGNOSIS — C9110 Chronic lymphocytic leukemia of B-cell type not having achieved remission: Secondary | ICD-10-CM | POA: Diagnosis present

## 2020-04-19 DIAGNOSIS — C9510 Chronic leukemia of unspecified cell type not having achieved remission: Secondary | ICD-10-CM | POA: Diagnosis not present

## 2020-04-19 DIAGNOSIS — I10 Essential (primary) hypertension: Secondary | ICD-10-CM | POA: Diagnosis present

## 2020-04-19 DIAGNOSIS — G8918 Other acute postprocedural pain: Secondary | ICD-10-CM

## 2020-04-19 DIAGNOSIS — C9590 Leukemia, unspecified not having achieved remission: Secondary | ICD-10-CM

## 2020-04-19 DIAGNOSIS — I447 Left bundle-branch block, unspecified: Secondary | ICD-10-CM | POA: Diagnosis present

## 2020-04-19 LAB — CBC
HCT: 34.4 % — ABNORMAL LOW (ref 36.0–46.0)
Hemoglobin: 11 g/dL — ABNORMAL LOW (ref 12.0–15.0)
MCH: 29.4 pg (ref 26.0–34.0)
MCHC: 32 g/dL (ref 30.0–36.0)
MCV: 92 fL (ref 80.0–100.0)
Platelets: 163 10*3/uL (ref 150–400)
RBC: 3.74 MIL/uL — ABNORMAL LOW (ref 3.87–5.11)
RDW: 12.8 % (ref 11.5–15.5)
WBC: 48.4 10*3/uL — ABNORMAL HIGH (ref 4.0–10.5)
nRBC: 0 % (ref 0.0–0.2)

## 2020-04-19 LAB — BASIC METABOLIC PANEL
Anion gap: 4 — ABNORMAL LOW (ref 5–15)
BUN: 17 mg/dL (ref 8–23)
CO2: 29 mmol/L (ref 22–32)
Calcium: 9.1 mg/dL (ref 8.9–10.3)
Chloride: 107 mmol/L (ref 98–111)
Creatinine, Ser: 0.97 mg/dL (ref 0.44–1.00)
GFR calc Af Amer: >60 mL/min (ref >60)
GFR calc non Af Amer: 54 mL/min — ABNORMAL LOW (ref >60)
Glucose, Bld: 104 mg/dL — ABNORMAL HIGH (ref 70–99)
Potassium: 4.5 mmol/L (ref 3.5–5.1)
Sodium: 140 mmol/L (ref 135–145)

## 2020-04-19 MED ORDER — SODIUM CHLORIDE 0.9 % IV SOLN: INTRAVENOUS | Status: DC

## 2020-04-19 MED ORDER — MORPHINE SULFATE (PF) 2 MG/ML IV SOLN
0.5000 mg | INTRAVENOUS | Status: DC | PRN
  Administered 2020-04-20 (×4): 0.5 mg via INTRAVENOUS
  Filled 2020-04-19 (×4): qty 1

## 2020-04-19 MED ORDER — FENTANYL CITRATE (PF) 100 MCG/2ML IJ SOLN
75.0000 ug | Freq: Once | INTRAMUSCULAR | Status: AC
  Administered 2020-04-19: 75 ug via INTRAVENOUS
  Filled 2020-04-19: qty 2

## 2020-04-19 MED ORDER — SENNOSIDES-DOCUSATE SODIUM 8.6-50 MG PO TABS: 1.0000 | ORAL_TABLET | Freq: Every evening | ORAL | Status: DC | PRN

## 2020-04-19 MED ORDER — FENTANYL CITRATE (PF) 100 MCG/2ML IJ SOLN
50.0000 ug | Freq: Once | INTRAMUSCULAR | Status: AC
  Administered 2020-04-19: 50 ug via INTRAVENOUS
  Filled 2020-04-19: qty 2

## 2020-04-19 MED ORDER — HYDROCODONE-ACETAMINOPHEN 5-325 MG PO TABS
1.0000 | ORAL_TABLET | Freq: Four times a day (QID) | ORAL | Status: DC | PRN
  Administered 2020-04-20: 1 via ORAL
  Filled 2020-04-19: qty 1

## 2020-04-19 NOTE — H&P (Signed)
History and Physical    Lindsay [name] [id number] DOB: December 28, 1933 DOA: 04/19/2020  PCP: [name], MD   Patient coming from: Home  I have personally briefly reviewed patient's old medical records in [place]  Chief Complaint: Left hip, left hip pain  HPI: Lindsay [name] is a 86 y.o. female with medical history significant for hypertension, osteoarthritis, leukemia with baseline WBC 28-32,000 since May 2019 who has opted for no diagnostic evaluation or treatment and follows with PCP only, who presents to the emergency room following a fall in which she lost her footing on the concrete pavement and fell onto her left hip with immediate onset of left hip pain.  She did not hit her head did not lose consciousness and denies pain anywhere else but on the left hip.  She denied preceding lightheadedness, palpitation, numbness tingling or weakness in face or one side of the body, denied preceding chest pain or shortness of breath.  Patient is independent and active as baseline ED Course: On arrival vitals normal except for elevated blood pressure 180/65.  Hip x-ray showed left femoral neck fracture with impaction.  Chest x-ray was clear.  EKG showed normal sinus rhythm and left bundle branch block, also present on EKG from 2015.  Blood work currently pending at the time of consult.  The emergency room provider spoke with orthopedist, Dr. Rudene Christians who will put patient on schedule for tomorrow once cleared.  Review of Systems: As per HPI otherwise 10 point review of systems negative.    Past Medical History:  Diagnosis Date  . Arthritis    RHEUMATOID  . Edema    MILD ANKLE OCCAS  . Fibromyalgia   . GERD (gastroesophageal reflux disease)   . Hypertension   . Kidney stones   . RLS (restless legs syndrome)     Past Surgical History:  Procedure Laterality Date  . ABDOMINAL HYSTERECTOMY    . CATARACT EXTRACTION W/PHACO Left 02/07/2016   Procedure: CATARACT EXTRACTION PHACO AND  INTRAOCULAR LENS PLACEMENT (IOC);  Surgeon: Estill Cotta, MD;  Location: [facility] ORS;  Service: Ophthalmology;  Laterality: Left;  [place] 01:33 AP% 24.7 CDE 39.57 fluid pack lot # [id number]  . EXTRACORPOREAL SHOCK WAVE LITHOTRIPSY Right 07/03/2019   Procedure: EXTRACORPOREAL SHOCK WAVE LITHOTRIPSY (ESWL);  Surgeon: Royston Cowper, MD;  Location: [facility] ORS;  Service: Urology;  Laterality: Right;  . EYE SURGERY    . LITHOTRIPSY    . TONSILLECTOMY       reports that she has never smoked. She has never used smokeless tobacco. She reports that she does not drink alcohol or use drugs.  Allergies  Allergen Reactions  . Demerol [Meperidine]   . Septra [Sulfamethoxazole-Trimethoprim]     History reviewed. No pertinent family history.   Prior to Admission medications   Medication Sig Start Date End Date Taking? Authorizing Provider  amitriptyline (ELAVIL) 10 MG tablet Take 20 mg by mouth at bedtime. 02/23/20   [provider]  amLODipine (NORVASC) 10 MG tablet Take 10 mg by mouth daily.    [provider]  HYDROcodone-acetaminophen (NORCO/VICODIN) 5-325 MG tablet Take 1 tablet by mouth daily.    [provider]    Physical Exam: Vitals:   04/19/20 2119 04/19/20 2121 04/19/20 2125  BP:  (!) 180/65   Pulse:  88   Resp:  20   Temp:  98.4 F (36.9 C)   TempSrc:  Oral   SpO2: 99% 100%   Weight:   49.9 kg  Height:   5\' 3"  (1.6 m)     Vitals:   04/19/20 2119 04/19/20 2121 04/19/20 2125  BP:  (!) 180/65   Pulse:  88   Resp:  20   Temp:  98.4 F (36.9 C)   TempSrc:  Oral   SpO2: 99% 100%   Weight:   49.9 kg  Height:   5\' 3"  (1.6 m)    Constitutional: Alert and awake, oriented x3, not in any acute distress. Eyes: PERLA, EOMI, irises appear normal, anicteric sclera,  ENMT: external ears and nose appear normal, normal hearing             Lips appears normal, oropharynx mucosa, tongue, posterior pharynx appear normal  Neck: neck appears normal, no masses,  normal ROM, no thyromegaly, no JVD  CVS: S1-S2 clear, no murmur rubs or gallops,  , no carotid bruits, pedal pulses palpable, No LE edema Respiratory:  clear to auscultation bilaterally, no wheezing, rales or rhonchi. Respiratory effort normal. No accessory muscle use.  Abdomen: soft nontender, nondistended, normal bowel sounds, no hepatosplenomegaly, no hernias Musculoskeletal: Patient lying on right side, left leg appears shorter, left leg neurovascularly intact Neuro: Grossly intact, able to move 4 extremities equally Psych: judgement and insight appear normal, stable mood and affect,  Skin: no rashes or lesions or ulcers, no induration or nodules   Labs on Admission: I have personally reviewed following labs and imaging studies  CBC: Recent Labs  Lab 04/19/20 2134  WBC 48.4*  HGB 11.0*  HCT 34.4*  MCV 92.0  PLT [id number]   Basic Metabolic Panel: Recent Labs  Lab 04/19/20 2134  NA 140  K 4.5  CL 107  CO2 29  GLUCOSE 104*  BUN 17  CREATININE 0.97  CALCIUM 9.1   GFR: CrCl cannot be calculated (Patient's most recent lab result is older than the maximum 21 days allowed.). Liver Function Tests: No results for input(s): AST, ALT, ALKPHOS, BILITOT, PROT, ALBUMIN in the last 168 hours. No results for input(s): LIPASE, AMYLASE in the last 168 hours. No results for input(s): AMMONIA in the last 168 hours. Coagulation Profile: Recent Labs  Lab 04/19/20 2344  INR 0.9   Cardiac Enzymes: No results for input(s): CKTOTAL, CKMB, CKMBINDEX, TROPONINI in the last 168 hours. BNP (last 3 results) No results for input(s): PROBNP in the last 8760 hours. HbA1C: No results for input(s): HGBA1C in the last 72 hours. CBG: No results for input(s): GLUCAP in the last 168 hours. Lipid Profile: No results for input(s): CHOL, HDL, LDLCALC, TRIG, CHOLHDL, LDLDIRECT in the last 72 hours. Thyroid Function Tests: No results for input(s): TSH, T4TOTAL, FREET4, T3FREE, THYROIDAB in the last 72  hours. Anemia Panel: No results for input(s): VITAMINB12, FOLATE, FERRITIN, TIBC, IRON, RETICCTPCT in the last 72 hours. Urine analysis:    Component Value Date/Time   COLORURINE Straw 03/06/2014 2053   APPEARANCEUR Clear 03/06/2014 2053   LABSPEC 1.008 03/06/2014 2053   PHURINE 7.0 03/06/2014 2053   GLUCOSEU Negative 03/06/2014 2053   HGBUR Negative 03/06/2014 2053   BILIRUBINUR Negative 03/06/2014 2053   KETONESUR Negative 03/06/2014 2053   PROTEINUR Negative 03/06/2014 2053   NITRITE Negative 03/06/2014 2053   LEUKOCYTESUR Negative 03/06/2014 2053    Radiological Exams on Admission: DG Chest 1 View  Result Date: 04/19/2020 CLINICAL DATA:  Fall EXAM: CHEST  1 VIEW COMPARISON:  None. FINDINGS: Heart and mediastinal contours are within normal limits. No focal opacities or effusions. No acute bony abnormality. IMPRESSION: No active  disease. Electronically Signed   By: Rolm Baptise M.D.   On: 04/19/2020 22:18   DG Hip Unilat With Pelvis 2-3 Views Left  Result Date: 04/19/2020 CLINICAL DATA:  Fall EXAM: DG HIP (WITH OR WITHOUT PELVIS) 2-3V LEFT COMPARISON:  None FINDINGS: There is a left femoral neck fracture with mild impaction. No subluxation or dislocation. No additional acute bony abnormality. IMPRESSION: Left femoral neck fracture with impaction. Electronically Signed   By: Rolm Baptise M.D.   On: 04/19/2020 22:17    EKG: Independently reviewed.   Assessment/Plan Principal Problem:   Fracture of femoral neck, left, closed ([place])   Accidental fall -Sudden onset left hip pain following an accidental fall onto left hip -Pain control -Consult orthopedics for further management -N.p.o. from midnight    Preoperative clearance -Patient has history of leukemia with WBC 48,000, denies history of CAD, CHF, syncope, COPD, OSA, arrhythmias and has good exercise tolerance for daily activities -Low risk for perioperative cardiopulmonary events,  --Untreated leukemia, presents some risk  but based on patient's goals and values, for no active intervention for her condition and active lifestyle, this should not preempt surgical repair in the a.m.   Leukemia, chronic leukocytosis -Patient has opted for no diagnostic evaluation and no treatment -WBC since 2019 ranging mostly from 28,000-32,000, currently 48,000  Essential hypertension -IV antihypertensives as needed when n.p.o., otherwise continue home amlodipine pending med rec  Left bundle branch block, old -No acute EKG changes.  Left bundle branch block also present on EKG in 2015 -Patient denies chest pain or history of chest pain    DVT prophylaxis: SCDs Code Status: DNR Family Communication: Daughter, Rutherford Limerick Disposition Plan: Back to previous home environment Consults called: Orthopedist, Dr. Rudene Christians Status:INP    Athena Masse MD [facility]     04/19/2020, 11:01 PM

## 2020-04-19 NOTE — ED Provider Notes (Signed)
[facility] Emergency Department Provider Note  ____________________________________________   First MD Initiated Contact with Patient 04/19/20 2137     (approximate)  I have reviewed the triage vital signs and the nursing notes.   HISTORY  Chief Complaint Fall  HPI PARLIE OMANS is a 86 y.o. female here for evaluation of left hip pain  Patient was outside, she turned in her foot got caught on a concrete causing her to fall directly on her left hip.  Severe and immediate 10 of 10 pain with a feeling like she broke her left hip she describes it now.  Denies any other injury.  No head strike or neck pain.  No back pain.  Reports all of her pain is located square across her left hip joint.  Severe pain with any movement.  Received 75 mcg fentanyl with EMS, reports pain ongoing and severe  No loss of sensation or cold foot.  Does have leukemia reports she is currently followed for that  No recent illness.  Is in normal health until fall occurred no chest pain or trouble breathing   Past Medical History:  Diagnosis Date  . Arthritis    RHEUMATOID  . Edema    MILD ANKLE OCCAS  . Fibromyalgia   . GERD (gastroesophageal reflux disease)   . Hypertension   . Kidney stones   . RLS (restless legs syndrome)     Patient Active Problem List   Diagnosis Date Noted  . Arthritis 03/10/2020  . Calculus of kidney 03/10/2020  . Diverticulosis 03/10/2020  . Fibromyalgia 03/10/2020  . IBS (irritable bowel syndrome) 03/10/2020    Past Surgical History:  Procedure Laterality Date  . ABDOMINAL HYSTERECTOMY    . CATARACT EXTRACTION W/PHACO Left 02/07/2016   Procedure: CATARACT EXTRACTION PHACO AND INTRAOCULAR LENS PLACEMENT (IOC);  Surgeon: Estill Cotta, MD;  Location: [facility] ORS;  Service: Ophthalmology;  Laterality: Left;  [place] 01:33 AP% 24.7 CDE 39.57 fluid pack lot # [id number]  . EXTRACORPOREAL SHOCK WAVE LITHOTRIPSY Right 07/03/2019   Procedure:  EXTRACORPOREAL SHOCK WAVE LITHOTRIPSY (ESWL);  Surgeon: Royston Cowper, MD;  Location: [facility] ORS;  Service: Urology;  Laterality: Right;  . EYE SURGERY    . LITHOTRIPSY    . TONSILLECTOMY      Prior to Admission medications   Medication Sig Start Date End Date Taking? Authorizing Provider  amitriptyline (ELAVIL) 10 MG tablet Take 20 mg by mouth at bedtime. 02/23/20   [provider]  amLODipine (NORVASC) 10 MG tablet Take 10 mg by mouth daily.    [provider]  HYDROcodone-acetaminophen (NORCO/VICODIN) 5-325 MG tablet Take 1 tablet by mouth daily.    [provider]    Allergies Demerol [meperidine] and Septra [sulfamethoxazole-trimethoprim]  History reviewed. No pertinent family history.  Social History Social History   Tobacco Use  . Smoking status: Never Smoker  . Smokeless tobacco: Never Used  Substance Use Topics  . Alcohol use: No  . Drug use: Never    Review of Systems Constitutional: No fever/chills or recent illness Eyes: No visual changes. ENT: No neck pain cardiovascular: Denies chest pain. Respiratory: Denies shortness of breath. Gastrointestinal: No abdominal pain.   Musculoskeletal: Negative for back pain.  See HPI, reports all pain in the left hip.  No other pain or injuries.  She did strike her left arm when she fell but denies that it is causing any pain or discomfort or injury. Skin: Negative for rash. Neurological: Negative for headaches, areas  of focal weakness or numbness.    ____________________________________________   PHYSICAL EXAM:  VITAL SIGNS: ED Triage Vitals  Enc Vitals Group     BP 04/19/20 2121 (!) 180/65     Pulse Rate 04/19/20 2121 88     Resp 04/19/20 2121 20     Temp 04/19/20 2121 98.4 F (36.9 C)     Temp Source 04/19/20 2121 Oral     SpO2 04/19/20 2119 99 %     Weight 04/19/20 2125 110 lb (49.9 kg)     Height 04/19/20 2125 5\' 3"  (1.6 m)     Head Circumference --      Peak Flow --      Pain  Score 04/19/20 2124 10     Pain Loc --      Pain Edu? --      Excl. in [place]? --     Constitutional: Alert and oriented.  Laying on her right side appears in pain, wincing moaning.  Appears severe pain she identifies it on her left hip region. Eyes: Conjunctivae are normal. Head: Atraumatic. Nose: No congestion/rhinnorhea. Mouth/Throat: Mucous membranes are moist. Neck: No stridor.  No cervical thoracic or lumbar tenderness. Cardiovascular: Normal rate, regular rhythm. Grossly normal heart sounds.  Good peripheral circulation. Respiratory: Normal respiratory effort.  No retractions. Lungs CTAB. Gastrointestinal: Soft and nontender. No distention. Musculoskeletal: No lower extremity tenderness nor edema. Neurologic:  Normal speech and language. No gross focal neurologic deficits are appreciated.  Skin:  Skin is warm, dry and intact. No rash noted. Psychiatric: Mood and affect are normal. Speech and behavior are normal.  ____________________________________________   LABS (all labs ordered are listed, but only abnormal results are displayed)  Labs Reviewed  RESPIRATORY PANEL BY RT PCR (FLU A&B, COVID)  CBC  BASIC METABOLIC PANEL  PROTIME-INR   ____________________________________________  EKG EKG is reviewed interpreted by me at 2120 Heart rate 99 QRS 149 QTc 460 Normal sinus rhythm, left bundle branch block.  No evidence of overt ischemia denoted.  ____________________________________________  [id number]  DG Chest 1 View  Result Date: 04/19/2020 CLINICAL DATA:  Fall EXAM: CHEST  1 VIEW COMPARISON:  None. FINDINGS: Heart and mediastinal contours are within normal limits. No focal opacities or effusions. No acute bony abnormality. IMPRESSION: No active disease. Electronically Signed   By: Rolm Baptise M.D.   On: 04/19/2020 22:18   DG Hip Unilat With Pelvis 2-3 Views Left  Result Date: 04/19/2020 CLINICAL DATA:  Fall EXAM: DG HIP (WITH OR WITHOUT PELVIS) 2-3V LEFT COMPARISON:   None FINDINGS: There is a left femoral neck fracture with mild impaction. No subluxation or dislocation. No additional acute bony abnormality. IMPRESSION: Left femoral neck fracture with impaction. Electronically Signed   By: Rolm Baptise M.D.   On: 04/19/2020 22:17    Imaging reviewed notable for left hip fracture ____________________________________________   PROCEDURES  Procedure(s) performed: None  Procedures  Critical Care performed: No  ____________________________________________   INITIAL IMPRESSION / ASSESSMENT AND PLAN / ED COURSE  Pertinent labs & imaging results that were available during my care of the patient were reviewed by me and considered in my medical decision making (see chart for details).   Patient presents after mechanical fall striking her left hip.  She has imaging consistent with left femoral neck fracture  She is neurovascularly intact.  Pain controlled with dosing of fentanyl.  Reports mechanical denies any injury to the head or other areas except for striking her left forearm which appears  atraumatic.  ----------------------------------------- 10:39 PM on 04/19/2020 -----------------------------------------  Discussed with patient and her daughter is at the bedside, both understand agreeable with plan for admission.  Case discussed also with Dr. Hessie Knows he request she be made n.p.o. after midnight  Patient reports pain improved with fentanyl, but is starting to come back becoming uncomfortable once again.  She does appear much improved at the present time, fully alert.  We will give additional dose of fentanyl as we admit admission to the hospitalist  Admission discussed with hospitalist Dr. Holland Commons was evaluated in Emergency Department on 04/19/2020 for the symptoms described in the history of present illness. She was evaluated in the context of the global COVID-19 pandemic, which necessitated consideration that the patient might  be at risk for infection with the SARS-CoV-2 virus that causes COVID-19. Institutional protocols and algorithms that pertain to the evaluation of patients at risk for COVID-19 are in a state of rapid change based on information released by regulatory bodies including the CDC and federal and state organizations. These policies and algorithms were followed during the patient's care in the ED.  Labs reviewed, notable leukocytosis.  However patient did notify me that she is currently followed for leukemia.  She denies any acute infectious symptoms.  She will be admitted and has further evaluation and work-up under the hospitalist service.  Clinical Course as of Apr 20 16  Mon Apr 19, 2020  2300 Admission accepted by hospitalist Dr. Damita Dunnings.   [MQ]    Clinical Course User Index [MQ] Delman Kitten, MD     ____________________________________________   FINAL CLINICAL IMPRESSION(S) / ED DIAGNOSES  Final diagnoses:  Closed fracture of left hip, initial encounter [facility])        Note:  This document was prepared using Dragon voice recognition software and may include unintentional dictation errors       Delman Kitten, MD 04/20/20 0019

## 2020-04-19 NOTE — ED Notes (Addendum)
   Pt agree'd to having her personal clothing cut off and said items were placed in the trash per pt request.

## 2020-04-19 NOTE — ED Triage Notes (Addendum)
Pt from home to ED via [facility] for a unwitnessed fall. Pt st she was walking outside when her shoe caught against the concrete causing her to fall. Pt A&Ox4 and denies any head trauma/ loc. Pt c/o left hip pain (10-10 pain scale). Pt was given 40mcg of fentanyl en route.   Pt denies being on any blood thinners at this time

## 2020-04-19 NOTE — ED Notes (Signed)
Pt taken to Xray at this time.    Pt's daughter at bedside at this time And was updated on her mother;s condition

## 2020-04-20 ENCOUNTER — Encounter: Payer: Self-pay | Admitting: Internal Medicine

## 2020-04-20 ENCOUNTER — Encounter
Admission: EM | Disposition: A | Discharge status: Home Health | Payer: Self-pay | Source: Home / Self Care | Attending: Internal Medicine

## 2020-04-20 ENCOUNTER — Inpatient Hospital Stay: Payer: Medicare Other | Admitting: Anesthesiology

## 2020-04-20 ENCOUNTER — Inpatient Hospital Stay: Payer: Medicare Other

## 2020-04-20 DIAGNOSIS — W19XXXD Unspecified fall, subsequent encounter: Secondary | ICD-10-CM

## 2020-04-20 DIAGNOSIS — I10 Essential (primary) hypertension: Secondary | ICD-10-CM

## 2020-04-20 DIAGNOSIS — I447 Left bundle-branch block, unspecified: Secondary | ICD-10-CM

## 2020-04-20 DIAGNOSIS — S72002K Fracture of unspecified part of neck of left femur, subsequent encounter for closed fracture with nonunion: Secondary | ICD-10-CM

## 2020-04-20 HISTORY — PX: HIP ARTHROPLASTY: SHX981

## 2020-04-20 LAB — RESPIRATORY PANEL BY RT PCR (FLU A&B, COVID)
Influenza A by PCR: NEGATIVE
Influenza B by PCR: NEGATIVE
SARS Coronavirus 2 by RT PCR: NEGATIVE

## 2020-04-20 LAB — PROTIME-INR
INR: 0.9 (ref 0.8–1.2)
Prothrombin Time: 12.2 seconds (ref 11.4–15.2)

## 2020-04-20 LAB — BASIC METABOLIC PANEL
Anion gap: 6 (ref 5–15)
BUN: 14 mg/dL (ref 8–23)
CO2: 25 mmol/L (ref 22–32)
Calcium: 8.8 mg/dL — ABNORMAL LOW (ref 8.9–10.3)
Chloride: 109 mmol/L (ref 98–111)
Creatinine, Ser: 0.86 mg/dL (ref 0.44–1.00)
GFR calc Af Amer: >60 mL/min (ref >60)
GFR calc non Af Amer: >60 mL/min (ref >60)
Glucose, Bld: 122 mg/dL — ABNORMAL HIGH (ref 70–99)
Potassium: 4.1 mmol/L (ref 3.5–5.1)
Sodium: 140 mmol/L (ref 135–145)

## 2020-04-20 LAB — CBC
HCT: 34.3 % — ABNORMAL LOW (ref 36.0–46.0)
Hemoglobin: 11.1 g/dL — ABNORMAL LOW (ref 12.0–15.0)
MCH: 29 pg (ref 26.0–34.0)
MCHC: 32.4 g/dL (ref 30.0–36.0)
MCV: 89.6 fL (ref 80.0–100.0)
Platelets: 160 10*3/uL (ref 150–400)
RBC: 3.83 MIL/uL — ABNORMAL LOW (ref 3.87–5.11)
RDW: 12.8 % (ref 11.5–15.5)
WBC: 41.2 10*3/uL — ABNORMAL HIGH (ref 4.0–10.5)
nRBC: 0 % (ref 0.0–0.2)

## 2020-04-20 LAB — SURGICAL PCR SCREEN
MRSA, PCR: NEGATIVE
Staphylococcus aureus: NEGATIVE

## 2020-04-20 SURGERY — HEMIARTHROPLASTY, HIP, DIRECT ANTERIOR APPROACH, FOR FRACTURE
Anesthesia: Choice | Site: Hip | Laterality: Left

## 2020-04-20 MED ORDER — BISACODYL 10 MG RE SUPP: 10.0000 mg | Freq: Every day | RECTAL | Status: DC | PRN

## 2020-04-20 MED ORDER — METOCLOPRAMIDE HCL 10 MG PO TABS: 5.0000 mg | ORAL_TABLET | Freq: Three times a day (TID) | ORAL | Status: DC | PRN

## 2020-04-20 MED ORDER — BUPIVACAINE-EPINEPHRINE 0.25% -1:200000 IJ SOLN
INTRAMUSCULAR | Status: DC | PRN
  Administered 2020-04-20: 30 mL

## 2020-04-20 MED ORDER — ROCURONIUM BROMIDE 10 MG/ML (PF) SYRINGE
PREFILLED_SYRINGE | INTRAVENOUS | Status: AC
  Filled 2020-04-20: qty 10

## 2020-04-20 MED ORDER — METHOCARBAMOL 1000 MG/10ML IJ SOLN
500.0000 mg | Freq: Three times a day (TID) | INTRAVENOUS | Status: DC | PRN
  Administered 2020-04-20: 500 mg via INTRAVENOUS
  Filled 2020-04-20: qty 5

## 2020-04-20 MED ORDER — ACETAMINOPHEN 10 MG/ML IV SOLN
INTRAVENOUS | Status: AC
  Filled 2020-04-20: qty 100

## 2020-04-20 MED ORDER — DIPHENHYDRAMINE HCL 12.5 MG/5ML PO ELIX: 12.5000 mg | ORAL_SOLUTION | ORAL | Status: DC | PRN

## 2020-04-20 MED ORDER — HYDROCODONE-ACETAMINOPHEN 5-325 MG PO TABS
1.0000 | ORAL_TABLET | ORAL | Status: DC | PRN
  Administered 2020-04-21 – 2020-04-22 (×3): 1 via ORAL
  Filled 2020-04-20 (×3): qty 1

## 2020-04-20 MED ORDER — FENTANYL CITRATE (PF) 100 MCG/2ML IJ SOLN
INTRAMUSCULAR | Status: DC | PRN
  Administered 2020-04-20: 50 ug via INTRAVENOUS
  Administered 2020-04-20 (×6): 25 ug via INTRAVENOUS

## 2020-04-20 MED ORDER — ONDANSETRON HCL 4 MG PO TABS: 4.0000 mg | ORAL_TABLET | Freq: Four times a day (QID) | ORAL | Status: DC | PRN

## 2020-04-20 MED ORDER — ACETAMINOPHEN 10 MG/ML IV SOLN
INTRAVENOUS | Status: DC | PRN
  Administered 2020-04-20: 1000 mg via INTRAVENOUS

## 2020-04-20 MED ORDER — NEOMYCIN-POLYMYXIN B GU 40-200000 IR SOLN
Status: DC | PRN
  Administered 2020-04-20: 4 mL

## 2020-04-20 MED ORDER — ALUM & MAG HYDROXIDE-SIMETH 200-200-20 MG/5ML PO SUSP: 30.0000 mL | ORAL | Status: DC | PRN

## 2020-04-20 MED ORDER — ENOXAPARIN SODIUM 40 MG/0.4ML ~~LOC~~ SOLN
40.0000 mg | SUBCUTANEOUS | Status: DC
  Administered 2020-04-21 – 2020-04-22 (×2): 40 mg via SUBCUTANEOUS
  Filled 2020-04-20 (×2): qty 0.4

## 2020-04-20 MED ORDER — OXYCODONE HCL 5 MG PO TABS
ORAL_TABLET | ORAL | Status: AC
  Administered 2020-04-20: 5 mg via ORAL
  Filled 2020-04-20: qty 1

## 2020-04-20 MED ORDER — SODIUM CHLORIDE 0.9 % IV SOLN: INTRAVENOUS | Status: DC

## 2020-04-20 MED ORDER — ENSURE ENLIVE PO LIQD
237.0000 mL | Freq: Two times a day (BID) | ORAL | Status: DC
  Administered 2020-04-21 – 2020-04-22 (×3): 237 mL via ORAL

## 2020-04-20 MED ORDER — SUGAMMADEX SODIUM 200 MG/2ML IV SOLN
INTRAVENOUS | Status: DC | PRN
  Administered 2020-04-20: 100 mg via INTRAVENOUS

## 2020-04-20 MED ORDER — AMITRIPTYLINE HCL 10 MG PO TABS
20.0000 mg | ORAL_TABLET | Freq: Every day | ORAL | Status: DC
  Administered 2020-04-20 – 2020-04-21 (×2): 20 mg via ORAL
  Filled 2020-04-20 (×3): qty 2

## 2020-04-20 MED ORDER — CEFAZOLIN SODIUM-DEXTROSE 1-4 GM/50ML-% IV SOLN
1.0000 g | Freq: Once | INTRAVENOUS | Status: AC
  Administered 2020-04-20: 1 g via INTRAVENOUS
  Filled 2020-04-20: qty 50

## 2020-04-20 MED ORDER — SUCCINYLCHOLINE CHLORIDE 20 MG/ML IJ SOLN
INTRAMUSCULAR | Status: DC | PRN
  Administered 2020-04-20: 80 mg via INTRAVENOUS

## 2020-04-20 MED ORDER — BUPIVACAINE HCL (PF) 0.5 % IJ SOLN
INTRAMUSCULAR | Status: AC
  Filled 2020-04-20: qty 10

## 2020-04-20 MED ORDER — ACETAMINOPHEN 500 MG PO TABS
500.0000 mg | ORAL_TABLET | Freq: Four times a day (QID) | ORAL | Status: AC
  Administered 2020-04-21 (×4): 500 mg via ORAL
  Filled 2020-04-20 (×4): qty 1

## 2020-04-20 MED ORDER — MAGNESIUM HYDROXIDE 400 MG/5ML PO SUSP: 30.0000 mL | Freq: Every day | ORAL | Status: DC | PRN

## 2020-04-20 MED ORDER — ROCURONIUM BROMIDE 100 MG/10ML IV SOLN
INTRAVENOUS | Status: DC | PRN
  Administered 2020-04-20: 30 mg via INTRAVENOUS

## 2020-04-20 MED ORDER — ERYTHROMYCIN 5 MG/GM OP OINT
1.0000 "application " | TOPICAL_OINTMENT | Freq: Three times a day (TID) | OPHTHALMIC | Status: DC
  Administered 2020-04-20 – 2020-04-22 (×5): 1 via OPHTHALMIC
  Filled 2020-04-20: qty 1

## 2020-04-20 MED ORDER — DEXAMETHASONE SODIUM PHOSPHATE 10 MG/ML IJ SOLN
INTRAMUSCULAR | Status: DC | PRN
  Administered 2020-04-20: 5 mg via INTRAVENOUS

## 2020-04-20 MED ORDER — OXYCODONE HCL 5 MG PO TABS: 5.0000 mg | ORAL_TABLET | Freq: Once | ORAL | Status: AC | PRN

## 2020-04-20 MED ORDER — FENTANYL CITRATE (PF) 100 MCG/2ML IJ SOLN
INTRAMUSCULAR | Status: AC
  Filled 2020-04-20: qty 2

## 2020-04-20 MED ORDER — ADULT MULTIVITAMIN W/MINERALS CH
1.0000 | ORAL_TABLET | Freq: Every day | ORAL | Status: DC
  Administered 2020-04-21 – 2020-04-22 (×2): 1 via ORAL
  Filled 2020-04-20 (×2): qty 1

## 2020-04-20 MED ORDER — CEFAZOLIN SODIUM-DEXTROSE 1-4 GM/50ML-% IV SOLN
1.0000 g | Freq: Four times a day (QID) | INTRAVENOUS | Status: AC
  Administered 2020-04-21 (×2): 1 g via INTRAVENOUS
  Filled 2020-04-20 (×2): qty 50

## 2020-04-20 MED ORDER — ACETAMINOPHEN 325 MG PO TABS
325.0000 mg | ORAL_TABLET | Freq: Four times a day (QID) | ORAL | Status: DC | PRN
  Administered 2020-04-22: 650 mg via ORAL
  Filled 2020-04-20: qty 2

## 2020-04-20 MED ORDER — DEXAMETHASONE SODIUM PHOSPHATE 10 MG/ML IJ SOLN
INTRAMUSCULAR | Status: AC
  Filled 2020-04-20: qty 1

## 2020-04-20 MED ORDER — DOCUSATE SODIUM 100 MG PO CAPS
100.0000 mg | ORAL_CAPSULE | Freq: Two times a day (BID) | ORAL | Status: DC
  Administered 2020-04-20 – 2020-04-22 (×4): 100 mg via ORAL
  Filled 2020-04-20 (×4): qty 1

## 2020-04-20 MED ORDER — FENTANYL CITRATE (PF) 100 MCG/2ML IJ SOLN
25.0000 ug | INTRAMUSCULAR | Status: DC | PRN
Start: 2020-04-20 — End: 2020-04-20
  Administered 2020-04-20 (×3): 25 ug via INTRAVENOUS

## 2020-04-20 MED ORDER — ACETAMINOPHEN 10 MG/ML IV SOLN
1000.0000 mg | Freq: Once | INTRAVENOUS | Status: DC | PRN
Start: 2020-04-20 — End: 2020-04-20

## 2020-04-20 MED ORDER — HYDROCODONE-ACETAMINOPHEN 7.5-325 MG PO TABS: 1.0000 | ORAL_TABLET | ORAL | Status: DC | PRN

## 2020-04-20 MED ORDER — ONDANSETRON HCL 4 MG/2ML IJ SOLN
INTRAMUSCULAR | Status: DC | PRN
  Administered 2020-04-20: 4 mg via INTRAVENOUS

## 2020-04-20 MED ORDER — MAGNESIUM CITRATE PO SOLN
1.0000 | Freq: Once | ORAL | Status: DC | PRN
  Filled 2020-04-20: qty 296

## 2020-04-20 MED ORDER — ONDANSETRON HCL 4 MG/2ML IJ SOLN: 4.0000 mg | Freq: Once | INTRAMUSCULAR | Status: DC | PRN

## 2020-04-20 MED ORDER — SUCCINYLCHOLINE CHLORIDE 200 MG/10ML IV SOSY
PREFILLED_SYRINGE | INTRAVENOUS | Status: AC
  Filled 2020-04-20: qty 10

## 2020-04-20 MED ORDER — MENTHOL 3 MG MT LOZG
1.0000 | LOZENGE | OROMUCOSAL | Status: DC | PRN
  Filled 2020-04-20: qty 9

## 2020-04-20 MED ORDER — AMLODIPINE BESYLATE 10 MG PO TABS
10.0000 mg | ORAL_TABLET | Freq: Every day | ORAL | Status: DC
  Administered 2020-04-20 – 2020-04-22 (×2): 10 mg via ORAL
  Filled 2020-04-20 (×3): qty 1

## 2020-04-20 MED ORDER — FENTANYL CITRATE (PF) 100 MCG/2ML IJ SOLN
INTRAMUSCULAR | Status: AC
  Administered 2020-04-20: 25 ug via INTRAVENOUS
  Filled 2020-04-20: qty 2

## 2020-04-20 MED ORDER — ONDANSETRON HCL 4 MG/2ML IJ SOLN: 4.0000 mg | Freq: Four times a day (QID) | INTRAMUSCULAR | Status: DC | PRN

## 2020-04-20 MED ORDER — METOCLOPRAMIDE HCL 5 MG/ML IJ SOLN: 5.0000 mg | Freq: Three times a day (TID) | INTRAMUSCULAR | Status: DC | PRN

## 2020-04-20 MED ORDER — PROPOFOL 10 MG/ML IV BOLUS
INTRAVENOUS | Status: DC | PRN
  Administered 2020-04-20 (×3): 10 mg via INTRAVENOUS
  Administered 2020-04-20: 80 mg via INTRAVENOUS
  Administered 2020-04-20 (×2): 10 mg via INTRAVENOUS

## 2020-04-20 MED ORDER — OXYCODONE HCL 5 MG/5ML PO SOLN: 5.0000 mg | Freq: Once | ORAL | Status: AC | PRN

## 2020-04-20 MED ORDER — PROPOFOL 10 MG/ML IV BOLUS
INTRAVENOUS | Status: AC
  Filled 2020-04-20: qty 20

## 2020-04-20 MED ORDER — LABETALOL HCL 5 MG/ML IV SOLN
10.0000 mg | INTRAVENOUS | Status: DC | PRN
  Administered 2020-04-20: 10 mg via INTRAVENOUS
  Filled 2020-04-20: qty 4

## 2020-04-20 MED ORDER — SODIUM CHLORIDE 0.9 % IV SOLN: INTRAVENOUS | Status: DC | PRN

## 2020-04-20 MED ORDER — MORPHINE SULFATE (PF) 2 MG/ML IV SOLN: 0.5000 mg | INTRAVENOUS | Status: DC | PRN

## 2020-04-20 MED ORDER — PHENOL 1.4 % MT LIQD
1.0000 | OROMUCOSAL | Status: DC | PRN
  Filled 2020-04-20: qty 177

## 2020-04-20 MED ORDER — LIDOCAINE 5 % EX PTCH
1.0000 | MEDICATED_PATCH | Freq: Every day | CUTANEOUS | Status: DC
  Administered 2020-04-20 – 2020-04-22 (×3): 1 via TRANSDERMAL
  Filled 2020-04-20 (×3): qty 1

## 2020-04-20 SURGICAL SUPPLY — 62 items
BLADE SAGITTAL AGGR TOOTH XLG (BLADE) ×2 IMPLANT
BNDG COHESIVE 6X5 TAN STRL LF (GAUZE/BANDAGES/DRESSINGS) ×6 IMPLANT
CANISTER SUCT 1200ML W/VALVE (MISCELLANEOUS) ×2 IMPLANT
CANISTER WOUND CARE 500ML ATS (WOUND CARE) ×2 IMPLANT
CEMENT BONE 1-PACK (Cement) ×4 IMPLANT
CEMENT RESTRICTOR DEPUY SZ 4 (Cement) ×2 IMPLANT
CHLORAPREP W/TINT 26 (MISCELLANEOUS) ×2 IMPLANT
COVER BACK TABLE REUSABLE LG (DRAPES) ×2 IMPLANT
COVER WAND RF STERILE (DRAPES) ×2 IMPLANT
DRAPE 3/4 80X56 (DRAPES) ×6 IMPLANT
DRAPE C-ARM XRAY 36X54 (DRAPES) ×2 IMPLANT
DRAPE INCISE IOBAN 66X60 STRL (DRAPES) IMPLANT
DRAPE POUCH INSTRU U-SHP 10X18 (DRAPES) ×2 IMPLANT
DRESSING SURGICEL FIBRLLR 1X2 (HEMOSTASIS) ×2 IMPLANT
DRSG OPSITE POSTOP 4X8 (GAUZE/BANDAGES/DRESSINGS) ×4 IMPLANT
DRSG SURGICEL FIBRILLAR 1X2 (HEMOSTASIS) ×4
ELECT BLADE 6.5 EXT (BLADE) ×2 IMPLANT
ELECT REM PT RETURN 9FT ADLT (ELECTROSURGICAL) ×2
ELECTRODE REM PT RTRN 9FT ADLT (ELECTROSURGICAL) ×1 IMPLANT
GLOVE BIOGEL PI IND STRL 9 (GLOVE) ×1 IMPLANT
GLOVE BIOGEL PI INDICATOR 9 (GLOVE) ×1
GLOVE SURG SYN 9.0  PF PI (GLOVE) ×2
GLOVE SURG SYN 9.0 PF PI (GLOVE) ×2 IMPLANT
GOWN SRG 2XL LVL 4 RGLN SLV (GOWNS) ×1 IMPLANT
GOWN STRL NON-REIN 2XL LVL4 (GOWNS) ×1
GOWN STRL REUS W/ TWL LRG LVL3 (GOWN DISPOSABLE) ×1 IMPLANT
GOWN STRL REUS W/TWL LRG LVL3 (GOWN DISPOSABLE) ×1
HEAD BIPOLAR SZ45 HEAD 28 (Head) ×2 IMPLANT
HEMOVAC 400CC 10FR (MISCELLANEOUS) IMPLANT
HIP FEM HD S 28 (Head) ×2 IMPLANT
HIP STEM FEM 3 STD (Stem) ×2 IMPLANT
HOLDER FOLEY CATH W/STRAP (MISCELLANEOUS) ×2 IMPLANT
HOOD PEEL AWAY FLYTE STAYCOOL (MISCELLANEOUS) ×2 IMPLANT
KIT PREVENA INCISION MGT 13 (CANNISTER) ×2 IMPLANT
KNIFE SCULPS 14X20 (INSTRUMENTS) ×2 IMPLANT
MAT ABSORB  FLUID 56X50 GRAY (MISCELLANEOUS) ×1
MAT ABSORB FLUID 56X50 GRAY (MISCELLANEOUS) ×1 IMPLANT
NDL SAFETY ECLIPSE 18X1.5 (NEEDLE) ×1 IMPLANT
NEEDLE HYPO 18GX1.5 SHARP (NEEDLE) ×1
NEEDLE SPNL 20GX3.5 QUINCKE YW (NEEDLE) ×4 IMPLANT
NS IRRIG 1000ML POUR BTL (IV SOLUTION) ×2 IMPLANT
PACK HIP COMPR (MISCELLANEOUS) ×2 IMPLANT
PRESSURIZER FEM CANAL M (MISCELLANEOUS) ×2 IMPLANT
SCALPEL PROTECTED #10 DISP (BLADE) ×4 IMPLANT
SOL PREP PVP 2OZ (MISCELLANEOUS) ×2
SOLUTION PREP PVP 2OZ (MISCELLANEOUS) ×1 IMPLANT
SPONGE DRAIN TRACH 4X4 STRL 2S (GAUZE/BANDAGES/DRESSINGS) ×2 IMPLANT
STAPLER SKIN PROX 35W (STAPLE) ×2 IMPLANT
STRAP SAFETY 5IN WIDE (MISCELLANEOUS) ×2 IMPLANT
SUT DVC 2 QUILL PDO  T11 36X36 (SUTURE) ×1
SUT DVC 2 QUILL PDO T11 36X36 (SUTURE) ×1 IMPLANT
SUT SILK 0 (SUTURE) ×1
SUT SILK 0 30XBRD TIE 6 (SUTURE) ×1 IMPLANT
SUT V-LOC 90 ABS DVC 3-0 CL (SUTURE) ×2 IMPLANT
SUT VIC AB 1 CT1 36 (SUTURE) ×2 IMPLANT
SYR 20ML LL LF (SYRINGE) ×2 IMPLANT
SYR 30ML LL (SYRINGE) ×2 IMPLANT
SYR 50ML LL SCALE MARK (SYRINGE) ×4 IMPLANT
SYR BULB IRRIG 60ML STRL (SYRINGE) ×2 IMPLANT
TAPE MICROFOAM 4IN (TAPE) ×2 IMPLANT
TOWEL OR 17X26 4PK STRL BLUE (TOWEL DISPOSABLE) ×2 IMPLANT
TRAY FOLEY MTR SLVR 16FR STAT (SET/KITS/TRAYS/PACK) ×2 IMPLANT

## 2020-04-20 NOTE — Progress Notes (Signed)
PROGRESS NOTE    Lindsay [name]  [id number] DOB: 02/19/1934 DOA: 04/19/2020 PCP: [name], MD    Brief Narrative:  Lindsay [name] is a 86 y.o. female with medical history significant for hypertension, osteoarthritis, leukemia with baseline WBC 28-32,000 since May 2019 who has opted for no diagnostic evaluation or treatment and follows with PCP only, who presents to the emergency room following a fall in which she lost her footing on the concrete pavement and fell onto her left hip with immediate onset of left hip pain.     Consultants:   ortho  Procedures: X-ray-left femoral neck fracture with impaction  Antimicrobials:      Subjective: C/o Left hip pain. No other complaints  Objective: Vitals:   04/20/20 0435 04/20/20 0444 04/20/20 0623 04/20/20 0803  BP: (!) 188/71 (!) 175/57 (!) 151/54 (!) 143/56  Pulse: 79 72 62 68  Resp:      Temp: 98.5 F (36.9 C)   98.5 F (36.9 C)  TempSrc: Oral   Oral  SpO2: 98%   99%  Weight:      Height:        Intake/Output Summary (Last 24 hours) at 04/20/2020 0826 Last data filed at 04/20/2020 0441 Gross per 24 hour  Intake 325.06 ml  Output 500 ml  Net -174.94 ml   Filed Weights   04/19/20 2125  Weight: 49.9 kg    Examination:  General exam: Appears calm and comfortable, NAD Respiratory system: Clear to auscultation. Respiratory effort normal. Cardiovascular system: S1 & S2 heard, RRR. No JVD, murmurs, rubs, gallops or clicks.  Gastrointestinal system: Abdomen is nondistended, soft and nontender. Normal bowel sounds heard. Central nervous system: Alert and oriented. No focal neurological deficits. Extremities: No edema Skin: Warm dry Psychiatry: Judgement and insight appear normal. Mood & affect appropriate.     Data Reviewed: I have personally reviewed following labs and imaging studies  CBC: Recent Labs  Lab 04/19/20 2134 04/20/20 0344  WBC 48.4* 41.2*  HGB 11.0* 11.1*  HCT 34.4* 34.3*  MCV 92.0  89.6  PLT 163 0000000   Basic Metabolic Panel: Recent Labs  Lab 04/19/20 2134 04/20/20 0344  NA 140 140  K 4.5 4.1  CL 107 109  CO2 29 25  GLUCOSE 104* 122*  BUN 17 14  CREATININE 0.97 0.86  CALCIUM 9.1 8.8*   GFR: Estimated Creatinine Clearance: 39 mL/min (by C-G formula based on SCr of 0.86 mg/dL). Liver Function Tests: No results for input(s): AST, ALT, ALKPHOS, BILITOT, PROT, ALBUMIN in the last 168 hours. No results for input(s): LIPASE, AMYLASE in the last 168 hours. No results for input(s): AMMONIA in the last 168 hours. Coagulation Profile: Recent Labs  Lab 04/19/20 2344  INR 0.9   Cardiac Enzymes: No results for input(s): CKTOTAL, CKMB, CKMBINDEX, TROPONINI in the last 168 hours. BNP (last 3 results) No results for input(s): PROBNP in the last 8760 hours. HbA1C: No results for input(s): HGBA1C in the last 72 hours. CBG: No results for input(s): GLUCAP in the last 168 hours. Lipid Profile: No results for input(s): CHOL, HDL, LDLCALC, TRIG, CHOLHDL, LDLDIRECT in the last 72 hours. Thyroid Function Tests: No results for input(s): TSH, T4TOTAL, FREET4, T3FREE, THYROIDAB in the last 72 hours. Anemia Panel: No results for input(s): VITAMINB12, FOLATE, FERRITIN, TIBC, IRON, RETICCTPCT in the last 72 hours. Sepsis Labs: No results for input(s): PROCALCITON, LATICACIDVEN in the last 168 hours.  Recent Results (from the past 240 hour(s))  Respiratory Panel by  RT PCR (Flu A&B, Covid) - Nasopharyngeal Swab     Status: None   Collection Time: 04/19/20 11:44 PM   Specimen: Nasopharyngeal Swab  Result Value Ref Range Status   SARS Coronavirus 2 by RT PCR NEGATIVE NEGATIVE Final    Comment: (NOTE) SARS-CoV-2 target nucleic acids are NOT DETECTED. The SARS-CoV-2 RNA is generally detectable in upper respiratoy specimens during the acute phase of infection. The lowest concentration of SARS-CoV-2 viral copies this assay can detect is 131 copies/mL. A negative result does not  preclude SARS-Cov-2 infection and should not be used as the sole basis for treatment or other patient management decisions. A negative result may occur with  improper specimen collection/handling, submission of specimen other than nasopharyngeal swab, presence of viral mutation(s) within the areas targeted by this assay, and inadequate number of viral copies (<131 copies/mL). A negative result must be combined with clinical observations, patient history, and epidemiological information. The expected result is Negative. Fact Sheet for Patients:  PinkCheek.be Fact Sheet for Healthcare Providers:  GravelBags.it This test is not yet ap proved or cleared by the [place] FDA and  has been authorized for detection and/or diagnosis of SARS-CoV-2 by FDA under an Emergency Use Authorization (EUA). This EUA will remain  in effect (meaning this test can be used) for the duration of the COVID-19 declaration under Section 564(b)(1) of the Act, 21 U.S.C. section 360bbb-3(b)(1), unless the authorization is terminated or revoked sooner.    Influenza A by PCR NEGATIVE NEGATIVE Final   Influenza B by PCR NEGATIVE NEGATIVE Final    Comment: (NOTE) The Xpert Xpress SARS-CoV-2/FLU/RSV assay is intended as an aid in  the diagnosis of influenza from Nasopharyngeal swab specimens and  should not be used as a sole basis for treatment. Nasal washings and  aspirates are unacceptable for Xpert Xpress SARS-CoV-2/FLU/RSV  testing. Fact Sheet for Patients: PinkCheek.be Fact Sheet for Healthcare Providers: GravelBags.it This test is not yet approved or cleared by the [place] FDA and  has been authorized for detection and/or diagnosis of SARS-CoV-2 by  FDA under an Emergency Use Authorization (EUA). This EUA will remain  in effect (meaning this test can be used) for the duration of the    Covid-19 declaration under Section 564(b)(1) of the Act, 21  U.S.C. section 360bbb-3(b)(1), unless the authorization is  terminated or revoked. Performed at [facility], [address] [place], [place] [address]   Surgical PCR screen     Status: None   Collection Time: 04/20/20  3:19 AM   Specimen: Nasal Mucosa; Nasal Swab  Result Value Ref Range Status   MRSA, PCR NEGATIVE NEGATIVE Final   Staphylococcus aureus NEGATIVE NEGATIVE Final    Comment: (NOTE) The Xpert SA Assay (FDA approved for NASAL specimens in patients 13 years of age and older), is one component of a comprehensive surveillance program. It is not intended to diagnose infection nor to guide or monitor treatment. Performed at [facility], [address] [place], [place] [address]          Radiology Studies: DG Chest 1 View  Result Date: 04/19/2020 CLINICAL DATA:  Fall EXAM: CHEST  1 VIEW COMPARISON:  None. FINDINGS: Heart and mediastinal contours are within normal limits. No focal opacities or effusions. No acute bony abnormality. IMPRESSION: No active disease. Electronically Signed   By: Rolm Baptise M.D.   On: 04/19/2020 22:18   DG Hip Unilat With Pelvis 2-3 Views Left  Result Date: 04/19/2020 CLINICAL DATA:  Fall EXAM: DG HIP (WITH OR WITHOUT PELVIS) 2-3V LEFT COMPARISON:  None FINDINGS: There is a left femoral neck fracture with mild impaction. No subluxation or dislocation. No additional acute bony abnormality. IMPRESSION: Left femoral neck fracture with impaction. Electronically Signed   By: Rolm Baptise M.D.   On: 04/19/2020 22:17        Scheduled Meds: Continuous Infusions: . sodium chloride 75 mL/hr at 04/20/20 0059  .  ceFAZolin (ANCEF) IV    . methocarbamol (ROBAXIN) IV Stopped (04/20/20 0320)    Assessment & Plan:   Principal Problem:   Fracture of femoral neck, left, closed (HCC) Active Problems:   Fibromyalgia   IBS (irritable bowel syndrome)   HTN  (hypertension)   Accidental fall   Preoperative clearance   Closed left hip fracture, initial encounter (HCC)   Left bundle branch block   Leukemia (HCC)   Fracture of femoral neck, left, closed ([place])   Accidental fall -Sudden onset left hip pain following an accidental fall onto left hip -Pain control -Consulted orthopedics ..>Plan is for left hip hemiarthroplasty,Plan on cemented stem and rapid mobilization. Add Lidocaine patch       Preoperative clearance -Patient has history of leukemia with WBC 48,000, denies history of CAD, CHF, syncope, COPD, OSA, arrhythmias and has good exercise tolerance for daily activities --Untreated leukemia, presents some risk but based on patient's goals and values, for no active intervention for her condition and active lifestyle, this should not preempt surgical repair in the a.m.   Leukemia, chronic leukocytosis -Patient has opted for no diagnostic evaluation and no treatment -WBC since 2019 ranging mostly from 28,000-32,000, currently 48,000  Essential hypertension -IV antihypertensives as needed when n.p.o., otherwise continue home amlodipine pending med rec Needs pain mx as causing bp to increase   Left bundle branch block, old -No acute EKG changes.  Left bundle branch block also present on EKG in 2015 -Patient denies chest pain or history of chest pain    DVT prophylaxis: SCDs Code Status: DNR Family Communication: GD at bedside Disposition Plan: Back to previous home environment Barrier: needs Lt hip repair.      LOS: 1 day   Time spent:45 min with >50% on coc    Nolberto Hanlon, MD [facility] Pager [phone]  If 7PM-7AM, please contact night-coverage [email or web address] Password [email or web address] 04/20/2020, 8:26 AM

## 2020-04-20 NOTE — TOC Progression Note (Signed)
Transition of Care [facility]) - Progression Note    Patient Details  Name: Lindsay [name] MRN: [id number] Date of Birth: 01-27-1934  Transition of Care [facility]) CM/SW [place], RN Phone Number: 04/20/2020, 12:30 PM  Clinical Narrative:     Requested the price of Lovenox 40 mg daily will notify once price obtained, She is set up with [facility] for [facility] services,  PT eval pending, will monitor for needs      Expected Discharge Plan and Services                                                 Social Determinants of Health (SDOH) Interventions    Readmission Risk Interventions No flowsheet data found.

## 2020-04-20 NOTE — Anesthesia Preprocedure Evaluation (Signed)
Anesthesia Evaluation  Patient identified by MRN, date of birth, ID band Patient awake    Reviewed: Allergy & Precautions, NPO status , Patient's Chart, lab work & pertinent test results  History of Anesthesia Complications Negative for: history of anesthetic complications  Airway Mallampati: II  TM Distance: >3 FB Neck ROM: Full    Dental  (+) Edentulous Upper, Poor Dentition, Dental Advisory Given   Pulmonary neg pulmonary ROS, neg sleep apnea, neg COPD, Patient abstained from smoking.Not current smoker,    Pulmonary exam normal breath sounds clear to auscultation       Cardiovascular Exercise Tolerance: Good METShypertension, Pt. on medications (-) CAD and (-) Past MI (-) dysrhythmias  Rhythm:Regular Rate:Normal - Systolic murmurs    Neuro/Psych negative neurological ROS  negative psych ROS   GI/Hepatic GERD  Controlled,(+)     (-) substance abuse  ,   Endo/Other  neg diabetes  Renal/GU negative Renal ROS     Musculoskeletal  (+) Arthritis , Rheumatoid disorders,  Fibromyalgia -Able to extend neck, no neck involvement   Abdominal   Peds  Hematology Patient has untreated lymphoma (per her choice/wishes). Has never had any easy bleeding/bruising or thrombotic complications. Platelets and coags normal.   Anesthesia Other Findings Past Medical History: No date: Arthritis     Comment:  RHEUMATOID No date: Edema     Comment:  MILD ANKLE OCCAS No date: Fibromyalgia No date: GERD (gastroesophageal reflux disease) No date: Hypertension No date: Kidney stones No date: RLS (restless legs syndrome)  Reproductive/Obstetrics                             Anesthesia Physical Anesthesia Plan  ASA: III  Anesthesia Plan: General/Spinal   Post-op Pain Management:    Induction: Intravenous  PONV Risk Score and Plan: 3 and Ondansetron, Dexamethasone, Propofol infusion and TIVA  Airway  Management Planned: Natural Airway  Additional Equipment: None  Intra-op Plan:   Post-operative Plan:   Informed Consent: I have reviewed the patients History and Physical, chart, labs and discussed the procedure including the risks, benefits and alternatives for the proposed anesthesia with the patient or authorized representative who has indicated his/her understanding and acceptance.   Patient has DNR.  Discussed DNR with patient and Suspend DNR.     Plan Discussed with: CRNA and Surgeon  Anesthesia Plan Comments: (Discussed R/B/A of neuraxial anesthesia technique with patient: - rare risks of spinal/epidural hematoma, nerve damage, infection - Risk of PDPH - Risk of nausea and vomiting - Risk of conversion to general anesthesia and its associated risks, including sore throat, damage to lips/teeth/oropharynx, and rare risks such as cardiac and respiratory events.  Patient voiced understanding. Patient agrees, after a thorough discussion, to suspend DNR for perioperative period.)        Anesthesia Quick Evaluation

## 2020-04-20 NOTE — Op Note (Signed)
04/20/2020  8:42 PM  PATIENT:  Lindsay [name]  86 y.o. female  PRE-OPERATIVE DIAGNOSIS:  Left Hip Fracture  POST-OPERATIVE DIAGNOSIS:  Left Hip Fracture  PROCEDURE:  Procedure(s): ARTHROPLASTY BIPOLAR HIP (HEMIARTHROPLASTY) (Left)  SURGEON: Laurene Footman, MD  ASSISTANTS: none  ANESTHESIA:   spinal  EBL:  Total I/O In: -  Out: 500 [Blood:500]  BLOOD ADMINISTERED:none  DRAINS: wound vac   LOCAL MEDICATIONS USED:  MARCAINE     SPECIMEN:  Source of Specimen:  left femoral head  DISPOSITION OF SPECIMEN:  PATHOLOGY  COUNTS:  YES  TOURNIQUET:  * No tourniquets in log *  IMPLANTS: Medacta AMIS cemented 3 stem, 4 bone plug, 45 mm biploar head and S 28 mm head  DICTATION: .Dragon Dictation   The patient was brought to the operating room and after spinal anesthesia was obtained patient was placed on the operative table with the ipsilateral foot into the Medacta attachment, contralateral leg on a well-padded table. C-arm was brought in and preop template x-ray taken. After prepping and draping in usual sterile fashion appropriate patient identification and timeout procedures were completed. Anterior approach to the hip was obtained and centered over the greater trochanter and TFL muscle. The subcutaneous tissue was incised hemostasis being achieved by electrocautery. TFL fascia was incised and the muscle retracted laterally deep retractor placed. The lateral femoral circumflex vessels were identified and ligated. The anterior capsule was exposed and a capsulotomy performed. The neck was identified and a femoral neck cut carried out with a saw.Fracture was subcapital and displaced The head was removed without difficulty and showed mild DJD Head sized to 45 mm and trial fit well. The leg was then externally rotated and ischiofemoral and pubofemoral releases carried out. The femur was sequentially broached to a size 4, size and the final components chosen. The 3 stand stem was icemented in  place along with a S 28 mm head and 45 mm liner. The hip was reduced and was stable the wound was thoroughly irrigated with fibrillar placed along the posterior capsule and medial neck. The deep fascia ws closed using a heavy Quill after infiltration of 30 cc of quarter percent Sensorcaine with epinephrine.3-0 V-loc to close the skin with skin staples. Xeroform and incisional wound vac appliled patient was sent to recovery in stable condition.   PLAN OF CARE: continue as inpatient

## 2020-04-20 NOTE — Transfer of Care (Signed)
Immediate Anesthesia Transfer of Care Note  Patient: Lindsay [name]  Procedure(s) Performed: ARTHROPLASTY BIPOLAR HIP (HEMIARTHROPLASTY) (Left Hip)  Patient Location: PACU  Anesthesia Type:General  Level of Consciousness: awake and patient cooperative  Airway & Oxygen Therapy: Patient Spontanous Breathing and Patient connected to face mask oxygen  Post-op Assessment: Report given to RN and Post -op Vital signs reviewed and stable  Post vital signs: Reviewed and stable  Last Vitals:  Vitals Value Taken Time  BP 185/55 04/20/20 2043  Temp    Pulse 81 04/20/20 2045  Resp 16 04/20/20 2045  SpO2 100 % 04/20/20 2045  Vitals shown include unvalidated device data.  Last Pain:  Vitals:   04/20/20 1546  TempSrc:   PainSc: 5          Complications: No apparent anesthesia complications

## 2020-04-20 NOTE — Consult Note (Signed)
Reason for Consult: Left femoral neck fracture Referring Physician: Dr. Holland Commons is an 86 y.o. female.  HPI: Patient is a 86 year old who was outside she turned and felt her foot gets stuck with her foot on the ground twisting and falling to the ground.  She denies loss of consciousness.  She denies prodromal symptoms.  She is normally a community ambulator mainly around the home where she lives with her family.  She is reports that she does the housework and stays active.  Past Medical History:  Diagnosis Date  . Arthritis    RHEUMATOID  . Edema    MILD ANKLE OCCAS  . Fibromyalgia   . GERD (gastroesophageal reflux disease)   . Hypertension   . Kidney stones   . RLS (restless legs syndrome)     Past Surgical History:  Procedure Laterality Date  . ABDOMINAL HYSTERECTOMY    . CATARACT EXTRACTION W/PHACO Left 02/07/2016   Procedure: CATARACT EXTRACTION PHACO AND INTRAOCULAR LENS PLACEMENT (IOC);  Surgeon: Estill Cotta, MD;  Location: [facility] ORS;  Service: Ophthalmology;  Laterality: Left;  [place] 01:33 AP% 24.7 CDE 39.57 fluid pack lot # [id number]  . EXTRACORPOREAL SHOCK WAVE LITHOTRIPSY Right 07/03/2019   Procedure: EXTRACORPOREAL SHOCK WAVE LITHOTRIPSY (ESWL);  Surgeon: Royston Cowper, MD;  Location: [facility] ORS;  Service: Urology;  Laterality: Right;  . EYE SURGERY    . LITHOTRIPSY    . TONSILLECTOMY      History reviewed. No pertinent family history.  Social History:  reports that she has never smoked. She has never used smokeless tobacco. She reports that she does not drink alcohol or use drugs.  Allergies:  Allergies  Allergen Reactions  . Demerol [Meperidine]   . Septra [Sulfamethoxazole-Trimethoprim]     Medications: I have reviewed the patient's current medications.  Results for orders placed or performed during the hospital encounter of 04/19/20 (from the past 48 hour(s))  CBC     Status: Abnormal   Collection Time: 04/19/20  9:34 PM  Result Value  Ref Range   WBC 48.4 (H) 4.0 - 10.5 K/uL   RBC 3.74 (L) 3.87 - 5.11 MIL/uL   Hemoglobin 11.0 (L) 12.0 - 15.0 g/dL   HCT 34.4 (L) 36.0 - 46.0 %   MCV 92.0 80.0 - 100.0 fL   MCH 29.4 26.0 - 34.0 pg   MCHC 32.0 30.0 - 36.0 g/dL   RDW 12.8 11.5 - 15.5 %   Platelets 163 150 - 400 K/uL   nRBC 0.0 0.0 - 0.2 %    Comment: Performed at [facility], [address] [place], [place] [id number]  Basic metabolic panel     Status: Abnormal   Collection Time: 04/19/20  9:34 PM  Result Value Ref Range   Sodium 140 135 - 145 mmol/L   Potassium 4.5 3.5 - 5.1 mmol/L   Chloride 107 98 - 111 mmol/L   CO2 29 22 - 32 mmol/L   Glucose, Bld 104 (H) 70 - 99 mg/dL    Comment: Glucose reference range applies only to samples taken after fasting for at least 8 hours.   BUN 17 8 - 23 mg/dL   Creatinine, Ser 0.97 0.44 - 1.00 mg/dL   Calcium 9.1 8.9 - 10.3 mg/dL   GFR calc non Af Amer 54 (L) >60 mL/min   GFR calc Af Amer >60 >60 mL/min   Anion gap 4 (L) 5 - 15    Comment: Performed at [facility], [address] [place], [place] [address]  Respiratory Panel by RT PCR (Flu A&B, Covid) - Nasopharyngeal Swab     Status: None   Collection Time: 04/19/20 11:44 PM   Specimen: Nasopharyngeal Swab  Result Value Ref Range   SARS Coronavirus 2 by RT PCR NEGATIVE NEGATIVE    Comment: (NOTE) SARS-CoV-2 target nucleic acids are NOT DETECTED. The SARS-CoV-2 RNA is generally detectable in upper respiratoy specimens during the acute phase of infection. The lowest concentration of SARS-CoV-2 viral copies this assay can detect is 131 copies/mL. A negative result does not preclude SARS-Cov-2 infection and should not be used as the sole basis for treatment or other patient management decisions. A negative result may occur with  improper specimen collection/handling, submission of specimen other than nasopharyngeal swab, presence of viral mutation(s) within the areas targeted by this assay, and inadequate  number of viral copies (<131 copies/mL). A negative result must be combined with clinical observations, patient history, and epidemiological information. The expected result is Negative. Fact Sheet for Patients:  PinkCheek.be Fact Sheet for Healthcare Providers:  GravelBags.it This test is not yet ap proved or cleared by the [place] FDA and  has been authorized for detection and/or diagnosis of SARS-CoV-2 by FDA under an Emergency Use Authorization (EUA). This EUA will remain  in effect (meaning this test can be used) for the duration of the COVID-19 declaration under Section 564(b)(1) of the Act, 21 U.S.C. section 360bbb-3(b)(1), unless the authorization is terminated or revoked sooner.    Influenza A by PCR NEGATIVE NEGATIVE   Influenza B by PCR NEGATIVE NEGATIVE    Comment: (NOTE) The Xpert Xpress SARS-CoV-2/FLU/RSV assay is intended as an aid in  the diagnosis of influenza from Nasopharyngeal swab specimens and  should not be used as a sole basis for treatment. Nasal washings and  aspirates are unacceptable for Xpert Xpress SARS-CoV-2/FLU/RSV  testing. Fact Sheet for Patients: PinkCheek.be Fact Sheet for Healthcare Providers: GravelBags.it This test is not yet approved or cleared by the [place] FDA and  has been authorized for detection and/or diagnosis of SARS-CoV-2 by  FDA under an Emergency Use Authorization (EUA). This EUA will remain  in effect (meaning this test can be used) for the duration of the  Covid-19 declaration under Section 564(b)(1) of the Act, 21  U.S.C. section 360bbb-3(b)(1), unless the authorization is  terminated or revoked. Performed at [facility], [address] [place], [place] [address]   Protime-INR     Status: None   Collection Time: 04/19/20 11:44 PM  Result Value Ref Range   Prothrombin Time 12.2 11.4 -  15.2 seconds   INR 0.9 0.8 - 1.2    Comment: (NOTE) INR goal varies based on device and disease states. Performed at [facility], [address], [place], [place] [address]   Surgical PCR screen     Status: None   Collection Time: 04/20/20  3:19 AM   Specimen: Nasal Mucosa; Nasal Swab  Result Value Ref Range   MRSA, PCR NEGATIVE NEGATIVE   Staphylococcus aureus NEGATIVE NEGATIVE    Comment: (NOTE) The Xpert SA Assay (FDA approved for NASAL specimens in patients 43 years of age and older), is one component of a comprehensive surveillance program. It is not intended to diagnose infection nor to guide or monitor treatment. Performed at [facility], [address] [place], [place] [address]   CBC     Status: Abnormal   Collection Time: 04/20/20  3:44 AM  Result Value Ref Range   WBC 41.2 (H) 4.0 - 10.5 K/uL   RBC 3.83 (L) 3.87 - 5.11 MIL/uL   Hemoglobin 11.1 (L) 12.0 - 15.0 g/dL   HCT 34.3 (L) 36.0 - 46.0 %   MCV 89.6 80.0 - 100.0 fL   MCH 29.0 26.0 - 34.0 pg   MCHC 32.4 30.0 - 36.0 g/dL   RDW 12.8 11.5 - 15.5 %   Platelets 160 150 - 400 K/uL   nRBC 0.0 0.0 - 0.2 %    Comment: Performed at [facility], [address], [place], [place] [id number]  Basic metabolic panel     Status: Abnormal   Collection Time: 04/20/20  3:44 AM  Result Value Ref Range   Sodium 140 135 - 145 mmol/L   Potassium 4.1 3.5 - 5.1 mmol/L   Chloride 109 98 - 111 mmol/L   CO2 25 22 - 32 mmol/L   Glucose, Bld 122 (H) 70 - 99 mg/dL    Comment: Glucose reference range applies only to samples taken after fasting for at least 8 hours.   BUN 14 8 - 23 mg/dL   Creatinine, Ser 0.86 0.44 - 1.00 mg/dL   Calcium 8.8 (L) 8.9 - 10.3 mg/dL   GFR calc non Af Amer >60 >60 mL/min   GFR calc Af Amer >60 >60 mL/min   Anion gap 6 5 - 15    Comment: Performed at [facility], [address] [place], [place] [address]    DG Chest 1 View  Result Date: 04/19/2020 CLINICAL  DATA:  Fall EXAM: CHEST  1 VIEW COMPARISON:  None. FINDINGS: Heart and mediastinal contours are within normal limits. No focal opacities or effusions. No acute bony abnormality. IMPRESSION: No active disease. Electronically Signed   By: Rolm Baptise M.D.   On: 04/19/2020 22:18   DG Hip Unilat With Pelvis 2-3 Views Left  Result Date: 04/19/2020 CLINICAL DATA:  Fall EXAM: DG HIP (WITH OR WITHOUT PELVIS) 2-3V LEFT COMPARISON:  None FINDINGS: There is a left femoral neck fracture with mild impaction. No subluxation or dislocation. No additional acute bony abnormality. IMPRESSION: Left femoral neck fracture with impaction. Electronically Signed   By: Rolm Baptise M.D.   On: 04/19/2020 22:17    Review of Systems Blood pressure (!) 151/54, pulse 62, temperature 98.5 F (36.9 C), temperature source Oral, resp. rate 17, height 5\' 3"  (1.6 m), weight 49.9 kg, SpO2 98 %. Physical Exam the left leg is flexed and shortened.  She is able to flex and extend the toes and has a palpable dorsalis pedis pulse.  Skin is intact around the hip. Radiographic review reveals subcapital fracture that is impacted and varus unlikely to be stable  Assessment/Plan: Left femoral neck fracture Plan is for left hip hemiarthroplasty.  Risks of surgery discussed.  Plan on cemented stem and rapid mobilization.  Hessie Knows 04/20/2020, 7:11 AM

## 2020-04-20 NOTE — Plan of Care (Signed)
  Problem: Pain Management: Goal: Pain level will decrease with appropriate interventions Outcome: Progressing   Problem: Clinical Measurements: Goal: Ability to maintain clinical measurements within normal limits will improve Outcome: Progressing Goal: Will remain free from infection Outcome: Progressing Goal: Diagnostic test results will improve Outcome: Progressing Goal: Respiratory complications will improve Outcome: Progressing   Problem: Pain Managment: Goal: General experience of comfort will improve Outcome: Progressing   Problem: Safety: Goal: Ability to remain free from injury will improve Outcome: Progressing   Problem: Skin Integrity: Goal: Risk for impaired skin integrity will decrease Outcome: Progressing

## 2020-04-20 NOTE — Anesthesia Procedure Notes (Signed)
Procedure Name: Intubation Date/Time: 04/20/2020 7:15 PM Performed by: Arita Miss, MD Pre-anesthesia Checklist: Patient identified, Patient being monitored, Timeout performed, Emergency Drugs available and Suction available Patient Re-evaluated:Patient Re-evaluated prior to induction Oxygen Delivery Method: Circle system utilized Preoxygenation: Pre-oxygenation with 100% oxygen Induction Type: IV induction Laryngoscope Size: 3 and McGraph Grade View: Grade I Tube type: Oral Tube size: 7.0 mm Number of attempts: 1 Airway Equipment and Method: Stylet Placement Confirmation: ETT inserted through vocal cords under direct vision,  positive ETCO2 and breath sounds checked- equal and bilateral Secured at: 21 cm Tube secured with: Tape Dental Injury: Teeth and Oropharynx as per pre-operative assessment

## 2020-04-20 NOTE — Progress Notes (Signed)
Initial Nutrition Assessment  RD working remotely.  DOCUMENTATION CODES:   Not applicable  INTERVENTION:  Once diet is advanced provide Ensure Enlive po BID, each supplement provides 350 kcal and 20 grams of protein. Patient prefers vanilla.  Provide daily MVI.  Encouraged adequate intake of calories and protein at meals with diet advancement.  NUTRITION DIAGNOSIS:   Inadequate oral intake related to decreased appetite as evidenced by per patient/family report.  GOAL:   Patient will meet greater than or equal to 90% of their needs  MONITOR:   PO intake, Supplement acceptance, Diet advancement, Labs, Weight trends, Skin, I & O's  REASON FOR ASSESSMENT:   Malnutrition Screening Tool, Consult Assessment of nutrition requirement/status  ASSESSMENT:   86 year old female with PMHx of HTN, GERD, arthritis, fibromyalgia, diverticulosis admitted after a fall with left femoral neck fracture.   Spoke with patient and her granddaughter over the phone. Patient reports she has had a decreased appetite for a while now. Per MST report her decreased appetite began when her husband passed away 5 years ago. Patient reports she only eats one meal per day and it is cream of potato soup. She does drink one bottle of Ensure High Protein daily (vanilla). Discussed importance of adequate intake of calories and protein. Patient's granddaughter has also recently discussed this with her. Patient is willing to increase to 2 bottles of Ensure daily and to add some high-protein snacks into diet such as yogurt. Patient is currently NPO today for planned surgery this afternoon.  Patient reports her UBW was around 145 lbs and she has lost weight over several years. According to chart she was 65.3 kg on 02/07/2016. She is now 49.9 kg (110 lbs). Unable to trend significance of weight loss since it occurred >1 year.   Medications reviewed and include: NS at 75 mL/hr.  Labs reviewed.  Patient is at risk for  malnutrition. Unable to determine if patient meets criteria for malnutrition at this time without completing NFPE.  NUTRITION - FOCUSED PHYSICAL EXAM:  Unable to complete at this time as RD is working remotely.  Diet Order:   Diet Order            Diet NPO time specified  Diet effective midnight             EDUCATION NEEDS:   Education needs have been addressed  Skin:  Skin Assessment: Reviewed RN Assessment  Last BM:  Unknown  Height:   Ht Readings from Last 1 Encounters:  04/19/20 5\' 3"  (1.6 m)   Weight:   Wt Readings from Last 1 Encounters:  04/19/20 49.9 kg   Ideal Body Weight:  52.3 kg  BMI:  Body mass index is 19.49 kg/m.  Estimated Nutritional Needs:   Kcal:  1400-1600  Protein:  70-80 grams  Fluid:  1.2-1.5 L/day  Jacklynn Barnacle, MS, RD, LDN Pager number available on Amion

## 2020-04-20 NOTE — TOC Benefit Eligibility Note (Signed)
Transition of Care [facility]) Benefit Eligibility Note    Patient Details  Name: Lindsay [name] MRN: [id number] Date of Birth: 01-18-1934   Medication/Dose: ENOXAPARIN  40 MG SQ DAILY X 14 DAYS  Covered?: Yes  Tier: (TIER- 4 DRUG)  Prescription Coverage Preferred Pharmacy: [facility]  Spoke with Person/Company/Phone Number:: KATHERINE  @ [employer] # [phone]  Co-Pay: $100.00  Prior Approval: No  Deductible: Met(OUT-OF-POCKET: UNMET)  Additional Notes: LOVENOX : NON-FORMULARY PRIOR APPROVAL- YES # [phone]    Memory Argue Phone Number: 04/20/2020, 1:46 PM

## 2020-04-20 NOTE — Progress Notes (Signed)
D: Pt alert and oriented x 4. Pt reports experiencing pain r/t fracture from fall, pain not relieved by PRN pain meds or lidocaine patch.   A: Scheduled medications administered to pt, per MD orders. Support and encouragement provided. Frequent verbal contact made.    R: No adverse drug reactions noted. Pt complaint with medications and treatment plan. Pt interacts well with staff on the unit. Pt is stable at this time, Will continue to monitor and provide care for as ordered.

## 2020-04-21 DIAGNOSIS — K580 Irritable bowel syndrome with diarrhea: Secondary | ICD-10-CM

## 2020-04-21 DIAGNOSIS — C9510 Chronic leukemia of unspecified cell type not having achieved remission: Secondary | ICD-10-CM

## 2020-04-21 LAB — CBC
HCT: 29.4 % — ABNORMAL LOW (ref 36.0–46.0)
Hemoglobin: 9.3 g/dL — ABNORMAL LOW (ref 12.0–15.0)
MCH: 29.5 pg (ref 26.0–34.0)
MCHC: 31.6 g/dL (ref 30.0–36.0)
MCV: 93.3 fL (ref 80.0–100.0)
Platelets: 134 10*3/uL — ABNORMAL LOW (ref 150–400)
RBC: 3.15 MIL/uL — ABNORMAL LOW (ref 3.87–5.11)
RDW: 13.1 % (ref 11.5–15.5)
WBC: 28.6 10*3/uL — ABNORMAL HIGH (ref 4.0–10.5)
nRBC: 0 % (ref 0.0–0.2)

## 2020-04-21 LAB — BASIC METABOLIC PANEL
Anion gap: 6 (ref 5–15)
BUN: 16 mg/dL (ref 8–23)
CO2: 21 mmol/L — ABNORMAL LOW (ref 22–32)
Calcium: 7.7 mg/dL — ABNORMAL LOW (ref 8.9–10.3)
Chloride: 112 mmol/L — ABNORMAL HIGH (ref 98–111)
Creatinine, Ser: 0.9 mg/dL (ref 0.44–1.00)
GFR calc Af Amer: >60 mL/min (ref >60)
GFR calc non Af Amer: 59 mL/min — ABNORMAL LOW (ref >60)
Glucose, Bld: 150 mg/dL — ABNORMAL HIGH (ref 70–99)
Potassium: 4.2 mmol/L (ref 3.5–5.1)
Sodium: 139 mmol/L (ref 135–145)

## 2020-04-21 MED ORDER — METHOCARBAMOL 500 MG PO TABS: 500.0000 mg | ORAL_TABLET | Freq: Three times a day (TID) | ORAL | Status: DC | PRN

## 2020-04-21 MED ORDER — FE FUMARATE-B12-VIT C-FA-IFC PO CAPS
1.0000 | ORAL_CAPSULE | Freq: Two times a day (BID) | ORAL | Status: DC
  Administered 2020-04-21 – 2020-04-22 (×3): 1 via ORAL
  Filled 2020-04-21 (×6): qty 1

## 2020-04-21 NOTE — Progress Notes (Signed)
PROGRESS NOTE  QUINNETTA MACEACHERN [id number] DOB: June 11, 1934 DOA: 04/19/2020 PCP: [name], MD  Brief History   Lindsay [name] Carteris a 86 y.o.femalewith medical history significant forhypertension,osteoarthritis,leukemia with baseline WBC 28-32,000 since May 2019who has opted for no diagnostic evaluation or treatment and follows with PCP only, who presents to the emergency room following a fall in which she lost her footing on the concrete pavement and fell onto her left hip with immediate onset of left hip pain.   She was found to have suffered a left femoral hip fracture with impaction. Orthopedic surgery was consulted. She underwent ORIF of the left hip on 04/20/2020. She has tolerated the procedure well. She has been evaluated by PT. Recommendation is for home with home health PT and a 5" wheeled rolling walker.  Consultants  . Orthopedic surgery  Procedures  . ORIF left hip  Antibiotics   Anti-infectives (From admission, onward)   Start     Dose/Rate Route Frequency Ordered Stop   04/21/20 0100  ceFAZolin (ANCEF) IVPB 1 g/50 mL premix     1 g 100 mL/hr over 30 Minutes Intravenous Every 6 hours 04/20/20 2149 04/21/20 0710   04/20/20 1600  ceFAZolin (ANCEF) IVPB 1 g/50 mL premix     1 g 100 mL/hr over 30 Minutes Intravenous  Once 04/20/20 0607 04/20/20 1916    .   Subjective  The patient is resting comfortably. No new complaints. She stated that she felt good getting up with PT.  Objective   Vitals:  Vitals:   04/21/20 1206 04/21/20 1546  BP: (!) 145/48 (!) 140/43  Pulse: 73 74  Resp:    Temp:    SpO2: 98%    Exam:  Constitutional:  . The patient is awake, alert, and oriented x 3. No acute distress. Respiratory:  . No increased work of breathing. . No wheezes, rales, or rhonchi . No tactile fremitus Cardiovascular:  . Regular rate and rhythm . No murmurs, ectopy, or gallups. . No lateral PMI. No thrills. Abdomen:  . Abdomen is soft, non-tender,  non-distended . No hernias, masses, or organomegaly . Normoactive bowel sounds.  Musculoskeletal:  . No cyanosis, clubbing, or edema Skin:  . No rashes, lesions, ulcers . palpation of skin: no induration or nodules Neurologic:  . CN 2-12 intact . Sensation all 4 extremities intact Psychiatric:  . Mental status o Mood, affect appropriate o Orientation to person, place, time  . judgment and insight appear intact  I have personally reviewed the following:   Today's Data  . Vitals,BMP, CBC  Imaging  . CXR . DG Hip unilateral  Scheduled Meds: . acetaminophen  500 mg Oral Q6H  . amitriptyline  20 mg Oral QHS  . amLODipine  10 mg Oral Daily  . docusate sodium  100 mg Oral BID  . enoxaparin (LOVENOX) injection  40 mg Subcutaneous Q24H  . erythromycin  1 application Left Eye TID  . feeding supplement (ENSURE ENLIVE)  237 mL Oral BID BM  . ferrous [id number] C-folic acid  1 capsule Oral BID PC  . lidocaine  1 patch Transdermal Daily  . multivitamin with minerals  1 tablet Oral Daily   Continuous Infusions: . sodium chloride 75 mL/hr at 04/20/20 0059  . sodium chloride 100 mL/hr at 04/20/20 2154  . methocarbamol (ROBAXIN) IV Stopped (04/20/20 0320)    Principal Problem:   Fracture of femoral neck, left, closed (HCC) Active Problems:   Fibromyalgia   IBS (irritable bowel syndrome)  HTN (hypertension)   Accidental fall   Preoperative clearance   Closed left hip fracture, initial encounter ([place])   Left bundle branch block   Leukemia ([place])   LOS: 2 days   A & P  Fracture of femoral neck, left, closed ([place]): Due to accidental fall. S/P IRIF left hi on 04/20/2020. PT has evaluated the patient and recommends home with home health. Discharge when cleared for dc by orthopedic surgery.  Preoperative clearance: Patient has history of leukemiawith WBC 48,000, denies history of CAD, CHF, syncope, COPD, OSA, arrhythmias and has good exercise tolerance for daily  activities. Untreated leukemia, presents some risk but based on patient's goals and values, for no active intervention for her condition andactive lifestyle, this should not preempt surgical repair in the a.m.   Leukemia, chronic leukocytosis: Chronically elevated WBC. Down from yesterday, but still fairly high WBC at 41.2. No signs of infection. Patient has opted for no diagnostic evaluation and no treatment of CLL.  Essential hypertension: Blood pressures are under fair control currently on oral norvasc.   Left bundle branch block, old: No acute EKG changes. Left bundle branch block also present on EKG in 2015. Patient denies chest pain or history of chest pain. Telemetry stable.  I have seen and examined this patient myself. I have spent 32 minutes in her evaluation and care.  DVT prophylaxis:SCDs Code Status:DNR Family Communication:GD at bedside Disposition Plan:Patient is from home. Anticipate discharge back to previous home environment with home health PT and a 5" wheeled walker. Barrier to discharge: Awaiting orthopedic clearance for discharge.  Charnelle Bergeman, DO [facility] Direct contact: see [email or web address]  7PM-7AM contact night coverage as above 04/21/2020, 5:14 PM  LOS: 2 days

## 2020-04-21 NOTE — Anesthesia Postprocedure Evaluation (Signed)
Anesthesia Post Note  Patient: Lindsay [name]  Procedure(s) Performed: ARTHROPLASTY BIPOLAR HIP (HEMIARTHROPLASTY) (Left Hip)  Patient location during evaluation: PACU Anesthesia Type: General Level of consciousness: awake and alert Pain management: pain level controlled Vital Signs Assessment: post-procedure vital signs reviewed and stable Respiratory status: spontaneous breathing, nonlabored ventilation, respiratory function stable and patient connected to nasal cannula oxygen Cardiovascular status: blood pressure returned to baseline and stable Postop Assessment: no apparent nausea or vomiting Anesthetic complications: no     Last Vitals:  Vitals:   04/21/20 0124 04/21/20 0431  BP: 140/80 130/67  Pulse: 65 70  Resp: 18 17  Temp: 36.7 C 36.8 C  SpO2: 98% 97%    Last Pain:  Vitals:   04/21/20 0502  TempSrc:   PainSc: 0-No pain                 Arita Miss

## 2020-04-21 NOTE — Progress Notes (Addendum)
Physical Therapy Treatment Patient Details Name: Lindsay [name] MRN: [id number] DOB: Nov 13, 1934 Today's Date: 04/21/2020    History of Present Illness Pt is an 86 yo female s/p L hemiarthroplasty, anterior approach, WBAT. PMH of HTN, OA, leukemia since May 2019    PT Comments    Pt alert, agreeable to PT, reported increase pain/stiffness compared to AM session. The patient needed very light minA to transfer to EOB due to increased pain of LLE, sit <> Stand with supervision and able to return to supine with supervision after ambulating. The patient ambulated ~38ft with RW and CGA, educated on RW position and upright posture but overall steady, safe. A few exercises were performed and pt verbalized understanding of continued mobility and exercises. The patient would benefit from further skilled PT intervention to continue to progress towards goals. Recommendation remains appropriate. Pt will need stair training prior to discharge.      Follow Up Recommendations  Home health PT     Equipment Recommendations  Rolling walker with 5" wheels;3in1 (PT)    Recommendations for Other Services       Precautions / Restrictions Precautions Precautions: Fall;Anterior Hip Restrictions Weight Bearing Restrictions: Yes LLE Weight Bearing: Weight bearing as tolerated    Mobility  Bed Mobility Overal bed mobility: Needs Assistance Bed Mobility: Supine to Sit     Supine to sit: Min assist     General bed mobility comments: very light assist for LLE due to pain  Transfers Overall transfer level: Needs assistance Equipment used: Rolling walker (2 wheeled) Transfers: Sit to/from Stand Sit to Stand: Supervision            Ambulation/Gait Ambulation/Gait assistance: Min guard Gait Distance (Feet): 40 Feet Assistive device: Rolling walker (2 wheeled)       General Gait Details: slow, cautious gait, no LOB noted   Stairs             Wheelchair Mobility    Modified  Rankin (Stroke Patients Only)       Balance Overall balance assessment: Needs assistance Sitting-balance support: Feet supported;No upper extremity supported Sitting balance-Leahy Scale: Good Sitting balance - Comments: Steady static sitting, reaching within BOS.   Standing balance support: During functional activity;Bilateral upper extremity supported Standing balance-Leahy Scale: Fair                              Cognition Arousal/Alertness: Awake/alert Behavior During Therapy: WFL for tasks assessed/performed Overall Cognitive Status: Within Functional Limits for tasks assessed                                        Exercises Other Exercises Other Exercises: heel slides, hip abduction, SLR x10 of RLE with PT tactile cues    General Comments        Pertinent Vitals/Pain Pain Assessment: Faces Faces Pain Scale: Hurts a little bit Pain Location: L hip with mobility Pain Descriptors / Indicators: Guarding;Sore Pain Intervention(s): Limited activity within patient's tolerance;Monitored during session;Repositioned    Home Living                      Prior Function            PT Goals (current goals can now be found in the care plan section) Progress towards PT goals: Progressing toward goals    Frequency  BID      PT Plan Current plan remains appropriate    Co-evaluation              AM-PAC PT "6 Clicks" Mobility   Outcome Measure  Help needed turning from your back to your side while in a flat bed without using bedrails?: None Help needed moving from lying on your back to sitting on the side of a flat bed without using bedrails?: None Help needed moving to and from a bed to a chair (including a wheelchair)?: None Help needed standing up from a chair using your arms (e.g., wheelchair or bedside chair)?: A Little Help needed to walk in hospital room?: A Little Help needed climbing 3-5 steps with a railing? : A  Little 6 Click Score: 21    End of Session Equipment Utilized During Treatment: Gait belt Activity Tolerance: Patient tolerated treatment well Patient left: in bed;with family/visitor present Nurse Communication: Mobility status PT Visit Diagnosis: Other abnormalities of gait and mobility (R26.89);Difficulty in walking, not elsewhere classified (R26.2);Muscle weakness (generalized) (M62.81);Pain Pain - Right/Left: Left Pain - part of body: Hip     Time: [id number] PT Time Calculation (min) (ACUTE ONLY): 18 min  Charges:  $Gait Training: 8-22 mins                     Lieutenant Diego PT, DPT 4:14 PM,04/21/20

## 2020-04-21 NOTE — Evaluation (Signed)
Occupational Therapy Evaluation Patient Details Name: Lindsay [name] MRN: [id number] DOB: 1934-07-19 Today's Date: 04/21/2020    History of Present Illness Pt is an 86 yo female s/p L hemiarthroplasty, anterior approach, WBAT. PMH of HTN, OA, leukemia since May 2019   Clinical Impression   Lindsay [name] seen for OT evaluation this date, POD#1 from above surgery. Pt received semi-supine in bed with daughter present at bedside. Pt was independent in all ADLs/IADLs prior to surgery, she denies use of adapted device for functional mobility or additional falls history in the past year. Pt reports she tries to stay active and moving. She states that caring for her two great-grandchildren during the day and cleaning the family home is how she primarily spends her time. Pt lives with her daughter and SIL in a 1-level house with 2 steps to get to her living area and bilateral hand rails. Pt is eager to return to PLOF with less pain and improved safety and independence. Pt currently requires minimal assist for LB dressing and bathing while in seated position due to pain and limited AROM of L hip. Pt instructed in self care skills, falls prevention strategies, home/routines modifications, DME/AE for LB bathing and dressing tasks, and compression stocking mgt strategies. Pt would benefit from additional instruction in self care skills and techniques to help maintain precautions with or without assistive devices to support recall and carryover prior to discharge. Recommend HHOT upon discharge.      Follow Up Recommendations  Home health OT    Equipment Recommendations  3 in 1 bedside commode    Recommendations for Other Services       Precautions / Restrictions Precautions Precautions: Fall;Anterior Hip Restrictions Weight Bearing Restrictions: Yes LLE Weight Bearing: Weight bearing as tolerated      Mobility Bed Mobility Overal bed mobility: Modified Independent             General bed  mobility comments: HOB elevated, increased time to perform. Pt assists LLE over EOB using BUE this date.  Transfers Overall transfer level: Needs assistance Equipment used: Rolling walker (2 wheeled) Transfers: Sit to/from Stand Sit to Stand: Supervision         General transfer comment: Pt comes to standing with good confidence. OT provides cueing for hand/foot placement and educates pt on safe use of RW for functional transfers this date.    Balance Overall balance assessment: Needs assistance Sitting-balance support: Feet supported;No upper extremity supported Sitting balance-Leahy Scale: Good Sitting balance - Comments: Steady static sitting, reaching within BOS.   Standing balance support: During functional activity;Bilateral upper extremity supported Standing balance-Leahy Scale: Good                             ADL either performed or assessed with clinical judgement   ADL                                         General ADL Comments: Pt is functionally limited by increased pain and decreased AROM of her L hip. She requires MIN A for LB ADL including bathing and dressing in a seated position. Supervision for functional mobility using a RW.     Vision Baseline Vision/History: Wears glasses Wears Glasses: Reading only Patient Visual Report: No change from baseline       Perception     Praxis  Pertinent Vitals/Pain Pain Assessment: Faces Faces Pain Scale: Hurts little more Pain Location: L hip with mobility Pain Descriptors / Indicators: Guarding;Sore Pain Intervention(s): Limited activity within patient's tolerance;Monitored during session;Repositioned;Ice applied;Utilized relaxation techniques     Hand Dominance Right   Extremity/Trunk Assessment Upper Extremity Assessment Upper Extremity Assessment: Overall WFL for tasks assessed   Lower Extremity Assessment Lower Extremity Assessment: LLE deficits/detail;Defer to PT  evaluation LLE Deficits / Details: s/p L hip hemi arthroplasty LLE Coordination: decreased gross motor       Communication Communication Communication: No difficulties   Cognition Arousal/Alertness: Awake/alert Behavior During Therapy: WFL for tasks assessed/performed Overall Cognitive Status: Within Functional Limits for tasks assessed                                     General Comments  Wound vac in place at start/end of session.    Exercises Other Exercises Other Exercises: Pt and caregiver (dtr at bedside) educated on role of OT in acute care, anterior hip precautions/considerations for ADL management, safe use of AE/DME for ADL management, Safe transfer techniques, falls prevention strategies, and routines modifications to support safety and fxl independence upon hospital DC.   Shoulder Instructions      Home Living Family/patient expects to be discharged to:: Private residence Living Arrangements: Children(Dtr/SIL) Available Help at Discharge: Family;Available 24 hours/day Type of Home: House Home Access: Stairs to enter [employer] of Steps: 1 small step + 2 interior steps Entrance Stairs-Rails: Left;Right;Can reach both(at interior steps) Home Layout: One level     Bathroom Shower/Tub: Walk-in shower;Door   [employer] Toilet: Standard Bathroom Accessibility: Yes How Accessible: Accessible via walker Home Equipment: Hand held shower head          Prior Functioning/Environment Level of Independence: Independent        Comments: Pt is active, community ambulator without AD. Denies additional falls history. Cares for great grandchildren during the day. Independent with ADL/IADL managment.        OT Problem List: Decreased strength;Decreased coordination;Pain;Decreased activity tolerance;Decreased safety awareness;Impaired balance (sitting and/or standing);Decreased knowledge of use of DME or AE;Decreased knowledge of  precautions;Decreased range of motion      OT Treatment/Interventions: Self-care/ADL training;Therapeutic exercise;Therapeutic activities;DME and/or AE instruction;Patient/family education;Balance training    OT Goals(Current goals can be found in the care plan section) Acute Rehab OT Goals Patient Stated Goal: To get back to my regular routines OT Goal Formulation: With patient Time For Goal Achievement: 05/05/20 Potential to Achieve Goals: Good ADL Goals Pt Will Perform Lower Body Dressing: sit to/from stand;with modified independence(LRAD PRN for safety) Pt Will Transfer to Toilet: with modified independence;ambulating;bedside commode(LRAD PRN for safety) Pt Will Perform Toileting - Clothing Manipulation and hygiene: sit to/from stand;with modified independence(LRAD PRN for safety) Additional ADL Goal #1: Pt will independently verbalize a plan to implement at least 3 learned falls prevention strategies into her daily routines/home environment for improved safety and fxl independence upon hospital DC.  OT Frequency: Min 1X/week   Barriers to D/C:            Co-evaluation              AM-PAC OT "6 Clicks" Daily Activity     Outcome Measure Help from another person eating meals?: None Help from another person taking care of personal grooming?: A Little Help from another person toileting, which includes using toliet, bedpan, or urinal?: A Little Help  from another person bathing (including washing, rinsing, drying)?: A Little Help from another person to put on and taking off regular upper body clothing?: None Help from another person to put on and taking off regular lower body clothing?: A Little 6 Click Score: 20   End of Session Equipment Utilized During Treatment: Gait belt;Rolling walker Nurse Communication: Mobility status;Other (comment)(Pt on RA.)  Activity Tolerance: Patient tolerated treatment well Patient left: in chair;with call bell/phone within reach;with chair  alarm set;with family/visitor present;with SCD's reapplied  OT Visit Diagnosis: Other abnormalities of gait and mobility (R26.89);Pain Pain - Right/Left: Left Pain - part of body: Hip                Time: [id number] OT Time Calculation (min): 35 min Charges:  OT General Charges $OT Visit: 1 Visit OT Evaluation $OT Eval Moderate Complexity: 1 Mod OT Treatments $Self Care/Home Management : 23-37 mins  Shara Blazing, M.S., OTR/L Ascom: [phone] 04/21/20, 11:34 AM

## 2020-04-21 NOTE — Evaluation (Signed)
Physical Therapy Evaluation Patient Details Name: Lindsay [name] MRN: [id number] DOB: 1934-10-24 Today's Date: 04/21/2020   History of Present Illness  Pt is an 86 yo female s/p L hemiarthroplasty, anterior approach, WBAT. 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 PLOF gathered from pt and from OT evaluation; pt ambulatory without AD independent for ADLs, no other falls. Pt will have 2 STE home upon discharge.  The patient was able to perform sit <> stand with RW and supervision, but session mobility limited by pt reported nausea and light headedness. SpO2 and HR WFLs, BP in sitting 145/43, in standing 133/51. The patient was able to perform a few seated exercises with verbal cues without complaints of pain.  Overall the patient demonstrated deficits (see "PT Problem List") that impede the patient's functional abilities, safety, and mobility and would benefit from skilled PT intervention. Recommendation is HHPT pending pt progress with mobility.       Follow Up Recommendations Home health PT    Equipment Recommendations  Rolling walker with 5" wheels;3in1 (PT)    Recommendations for Other Services       Precautions / Restrictions Precautions Precautions: Fall;Anterior Hip Restrictions Weight Bearing Restrictions: Yes LLE Weight Bearing: Weight bearing as tolerated      Mobility  Bed Mobility Overal bed mobility: Modified Independent             General bed mobility comments: Pt up in chair at start of session  Transfers Overall transfer level: Needs assistance Equipment used: Rolling walker (2 wheeled) Transfers: Sit to/from Stand Sit to Stand: Supervision         General transfer comment: session limited by pt nausea and light headedness  Ambulation/Gait                Stairs            Wheelchair Mobility    Modified Rankin (Stroke Patients Only)        Balance Overall balance assessment: Needs assistance Sitting-balance support: Feet supported;No upper extremity supported Sitting balance-Leahy Scale: Good Sitting balance - Comments: Steady static sitting, reaching within BOS.   Standing balance support: During functional activity;Bilateral upper extremity supported Standing balance-Leahy Scale: Fair                               Pertinent Vitals/Pain Pain Assessment: Faces Faces Pain Scale: Hurts a little bit Pain Location: L hip with mobility Pain Descriptors / Indicators: Guarding;Sore Pain Intervention(s): Limited activity within patient's tolerance;Monitored during session;Repositioned;Ice applied    Home Living Family/patient expects to be discharged to:: Private residence Living Arrangements: Children(Dtr/SIL) Available Help at Discharge: Family;Available 24 hours/day Type of Home: House Home Access: Stairs to enter Entrance Stairs-Rails: Left;Right;Can reach both(at interior steps) Entrance Stairs-Number of Steps: 1 small step + 2 interior steps Home Layout: One level Home Equipment: Hand held shower head      Prior Function Level of Independence: Independent         Comments: Pt is active, community ambulator without AD. Denies additional falls history. Cares for great grandchildren during the day. Independent with ADL/IADL managment.     Hand Dominance   Dominant Hand: Right    Extremity/Trunk Assessment   Upper Extremity Assessment Upper Extremity Assessment: Overall WFL for tasks assessed    Lower Extremity Assessment Lower Extremity Assessment: RLE deficits/detail;LLE deficits/detail  RLE Deficits / Details: WFLs for tasks assessed LLE Deficits / Details: s/p L hip hemi arthroplasty LLE Coordination: decreased gross motor       Communication   Communication: No difficulties  Cognition Arousal/Alertness: Awake/alert Behavior During Therapy: WFL for tasks  assessed/performed Overall Cognitive Status: Within Functional Limits for tasks assessed                                        General Comments General comments (skin integrity, edema, etc.): Wound vac in place at start/end of session.    Exercises Other Exercises Other Exercises: BP assessed due to pt complaints of light headedness and dizziness. SpO2 and HR WFLs, in sitting BP 145/43, in standing 133/51. Other Exercises: Pt performed quad sets, ankle pumps, and glute squeezes x15 ea side   Assessment/Plan    PT Assessment Patient needs continued PT services  PT Problem List Decreased strength;Decreased mobility;Decreased range of motion;Decreased knowledge of precautions;Decreased activity tolerance;Decreased balance;Decreased knowledge of use of DME;Pain       PT Treatment Interventions DME instruction;Therapeutic exercise;Gait training;Balance training;Stair training;Neuromuscular re-education;Functional mobility training;Patient/family education;Therapeutic activities    PT Goals (Current goals can be found in the Care Plan section)  Acute Rehab PT Goals Patient Stated Goal: to feel better PT Goal Formulation: With patient Time For Goal Achievement: 05/05/20 Potential to Achieve Goals: Good    Frequency BID   Barriers to discharge        Co-evaluation               AM-PAC PT "6 Clicks" Mobility  Outcome Measure Help needed turning from your back to your side while in a flat bed without using bedrails?: None Help needed moving from lying on your back to sitting on the side of a flat bed without using bedrails?: None Help needed moving to and from a bed to a chair (including a wheelchair)?: None Help needed standing up from a chair using your arms (e.g., wheelchair or bedside chair)?: A Little Help needed to walk in hospital room?: A Little Help needed climbing 3-5 steps with a railing? : A Little 6 Click Score: 21    End of Session Equipment  Utilized During Treatment: Gait belt Activity Tolerance: Other (comment)(limited by nausea and light headedness) Patient left: in chair;with chair alarm set;with call bell/phone within reach;with nursing/sitter in room;with SCD's reapplied Nurse Communication: Mobility status PT Visit Diagnosis: Other abnormalities of gait and mobility (R26.89);Difficulty in walking, not elsewhere classified (R26.2);Muscle weakness (generalized) (M62.81);Pain Pain - Right/Left: Left Pain - part of body: Hip    Time: 1003-1035 PT Time Calculation (min) (ACUTE ONLY): 32 min   Charges:   PT Evaluation $PT Eval Low Complexity: 1 Low PT Treatments $Therapeutic Exercise: 8-22 mins      Lieutenant Diego PT, DPT 11:54 AM,04/21/20

## 2020-04-21 NOTE — Progress Notes (Signed)
D: Pt alert and oriented x 4. Pt denies experiencing any pain at this time. Pt was OOB w/PT using the RW and BSC. Msg was sent to care management team regarding discharge planning w/regards to a family matter.  A: Scheduled medications administered to pt, per MD orders. Support and encouragement provided. Frequent verbal contact made.   R: No adverse drug reactions noted. Pt complaint with medications and treatment plan. Pt interacts well with staff on the unit. Pt is stable at this time, Will continue to monitor and provide care for as ordered.

## 2020-04-22 DIAGNOSIS — Z01818 Encounter for other preprocedural examination: Secondary | ICD-10-CM

## 2020-04-22 DIAGNOSIS — M797 Fibromyalgia: Secondary | ICD-10-CM

## 2020-04-22 LAB — CBC
HCT: 28 % — ABNORMAL LOW (ref 36.0–46.0)
Hemoglobin: 8.8 g/dL — ABNORMAL LOW (ref 12.0–15.0)
MCH: 29 pg (ref 26.0–34.0)
MCHC: 31.4 g/dL (ref 30.0–36.0)
MCV: 92.4 fL (ref 80.0–100.0)
Platelets: 129 10*3/uL — ABNORMAL LOW (ref 150–400)
RBC: 3.03 MIL/uL — ABNORMAL LOW (ref 3.87–5.11)
RDW: 13.2 % (ref 11.5–15.5)
WBC: 27.7 10*3/uL — ABNORMAL HIGH (ref 4.0–10.5)
nRBC: 0 % (ref 0.0–0.2)

## 2020-04-22 LAB — SURGICAL PATHOLOGY

## 2020-04-22 MED ORDER — DOCUSATE SODIUM 100 MG PO CAPS: 100.0000 mg | ORAL_CAPSULE | Freq: Two times a day (BID) | ORAL | 0 refills | Status: DC

## 2020-04-22 MED ORDER — LACTULOSE 10 GM/15ML PO SOLN
10.0000 g | Freq: Three times a day (TID) | ORAL | Status: DC
  Administered 2020-04-22: 10 g via ORAL
  Filled 2020-04-22: qty 30

## 2020-04-22 MED ORDER — BISACODYL 10 MG RE SUPP: 10.0000 mg | Freq: Every day | RECTAL | 0 refills | Status: DC | PRN

## 2020-04-22 MED ORDER — ADULT MULTIVITAMIN W/MINERALS CH: 1.0000 | ORAL_TABLET | Freq: Every day | ORAL | 0 refills | Status: DC

## 2020-04-22 MED ORDER — ENOXAPARIN SODIUM 40 MG/0.4ML ~~LOC~~ SOLN: 40.0000 mg | SUBCUTANEOUS | 0 refills | Status: DC

## 2020-04-22 MED ORDER — HYDROCODONE-ACETAMINOPHEN 5-325 MG PO TABS: 1.0000 | ORAL_TABLET | ORAL | 0 refills | Status: DC | PRN

## 2020-04-22 MED ORDER — LIDOCAINE 5 % EX PTCH: 1.0000 | MEDICATED_PATCH | Freq: Every day | CUTANEOUS | 0 refills | Status: DC

## 2020-04-22 MED ORDER — FE FUMARATE-B12-VIT C-FA-IFC PO CAPS: 1.0000 | ORAL_CAPSULE | Freq: Two times a day (BID) | ORAL | 0 refills | Status: DC

## 2020-04-22 MED ORDER — ENSURE ENLIVE PO LIQD: 237.0000 mL | Freq: Two times a day (BID) | ORAL | 12 refills | Status: DC

## 2020-04-22 NOTE — Progress Notes (Signed)
Physical Therapy Treatment Patient Details Name: Lindsay [name] MRN: [id number] DOB: 02/19/1934 Today's Date: 04/22/2020    History of Present Illness Pt is an 86 yo female s/p L hemiarthroplasty, anterior approach, WBAT. 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 OOB with min guard, light tactile assist at shoulders.  She is able to stand and walk to bathroom and into hallway 79' with slow but steady gait.  Minimal pain noted.  She remains in recliner after session.  Will address stairs before lunch.   Follow Up Recommendations  Home health PT;Supervision for mobility/OOB     Equipment Recommendations  Rolling walker with 5" wheels;3in1 (PT)    Recommendations for Other Services       Precautions / Restrictions Precautions Precautions: Fall;Anterior Hip Restrictions Weight Bearing Restrictions: Yes LLE Weight Bearing: Weight bearing as tolerated    Mobility  Bed Mobility   Bed Mobility: Supine to Sit     Supine to sit: Min guard     General bed mobility comments: very light assist for LLE due to pain  Transfers Overall transfer level: Needs assistance Equipment used: Rolling walker (2 wheeled) Transfers: Sit to/from Stand Sit to Stand: Supervision            Ambulation/Gait Ambulation/Gait assistance: Min guard Gait Distance (Feet): 75 Feet Assistive device: Rolling walker (2 wheeled) Gait Pattern/deviations: Step-through pattern;Decreased step length - right;Decreased step length - left Gait velocity: decreased   General Gait Details: slow, cautious gait, no LOB noted   Stairs             Wheelchair Mobility    Modified Rankin (Stroke Patients Only)       Balance Overall balance assessment: Needs assistance Sitting-balance support: Feet supported;No upper extremity supported Sitting balance-Leahy Scale: Good Sitting balance - Comments: Steady static sitting, reaching  within BOS.   Standing balance support: During functional activity;Bilateral upper extremity supported Standing balance-Leahy Scale: Fair                              Cognition Arousal/Alertness: Awake/alert Behavior During Therapy: WFL for tasks assessed/performed Overall Cognitive Status: Within Functional Limits for tasks assessed                                        Exercises Other Exercises Other Exercises: heel slides, hip abduction, SLR x10 of RLE with PT tactile cues    General Comments        Pertinent Vitals/Pain Pain Assessment: Faces Faces Pain Scale: Hurts little more Pain Location: L hip with mobility Pain Descriptors / Indicators: Guarding;Sore Pain Intervention(s): Limited activity within patient's tolerance;Monitored during session;Repositioned    Home Living                      Prior Function            PT Goals (current goals can now be found in the care plan section) Progress towards PT goals: Progressing toward goals    Frequency    BID      PT Plan Current plan remains appropriate    Co-evaluation              AM-PAC PT "6 Clicks" Mobility   Outcome Measure  Help needed turning  from your back to your side while in a flat bed without using bedrails?: None Help needed moving from lying on your back to sitting on the side of a flat bed without using bedrails?: A Little Help needed moving to and from a bed to a chair (including a wheelchair)?: A Little Help needed standing up from a chair using your arms (e.g., wheelchair or bedside chair)?: A Little Help needed to walk in hospital room?: A Little Help needed climbing 3-5 steps with a railing? : A Little 6 Click Score: 19    End of Session Equipment Utilized During Treatment: Gait belt Activity Tolerance: Patient tolerated treatment well Patient left: in chair;with call bell/phone within reach;with chair alarm set;with family/visitor  present Nurse Communication: Mobility status Pain - Right/Left: Left Pain - part of body: Hip     Time: [id number] PT Time Calculation (min) (ACUTE ONLY): 24 min  Charges:  $Gait Training: 8-22 mins $Therapeutic Exercise: 8-22 mins                   Chesley Noon, PTA 04/22/20, 10:04 AM

## 2020-04-22 NOTE — Discharge Summary (Signed)
Physician Discharge Summary  Lindsay [name] [id number] DOB: 1934/01/10 DOA: 04/19/2020  PCP: [name], MD  Admit date: 04/19/2020 Discharge date: 04/22/2020  Recommendations for Outpatient Follow-up:  1. Discharge to home with home health PT/RN and a Rolling walker. 2. Weight bearing as tolerated to left leg. 3. Remove Provena dressing on 05/01/2020 and replace it with honeycomb dressing. 4. Keep dressing clean and dry at all times. 5. Follow up with [place] in 2 weeks. 6. Follow up with PCP in 7-10 days.  Follow-up Information    Renata Caprice On 05/07/2020.   Specialties: Orthopedic Surgery, Emergency Medicine Why: @ 9:15 am Contact information: [place] [address] [phone]          Discharge Diagnoses: Principal diagnosis is #1 1. Left Femoral Neck Fracture 2. CLL 3. Hypertension 4. Left Bundle Branch Block 5. S/P ORIF 04/20/2020  Discharge Condition: Fair  Disposition: Home with home health  Diet recommendation: Heart healthy  Filed Weights   04/19/20 2125  Weight: 49.9 kg    History of present illness:  Lindsay [name] is a 86 y.o. female with medical history significant for hypertension, osteoarthritis, leukemia with baseline WBC 28-32,000 since May 2019 who has opted for no diagnostic evaluation or treatment and follows with PCP only, who presents to the emergency room following a fall in which she lost her footing on the concrete pavement and fell onto her left hip with immediate onset of left hip pain.  She did not hit her head did not lose consciousness and denies pain anywhere else but on the left hip.  She denied preceding lightheadedness, palpitation, numbness tingling or weakness in face or one side of the body, denied preceding chest pain or shortness of breath.  Patient is independent and active as baseline ED Course: On arrival vitals normal except for elevated blood pressure 180/65.  Hip x-ray showed  left femoral neck fracture with impaction.  Chest x-ray was clear.  EKG showed normal sinus rhythm and left bundle branch block, also present on EKG from 2015.  Blood work currently pending at the time of consult.  The emergency room provider spoke with orthopedist, Dr. Rudene Christians who will put patient on schedule for tomorrow once cleared.  Hospital Course:   She was found to have suffered a left femoral hip fracture with impaction. Orthopedic surgery was consulted. She underwent ORIF of the left hip on 04/20/2020. She has tolerated the procedure well. She has been evaluated by PT. Recommendation is for home with home health PT and a 5" wheeled rolling walker.  Today's assessment: S: The patient is resting comfortably. No new complaints. O: Vitals:  Vitals:   04/22/20 0016 04/22/20 0824  BP: (!) 135/50 (!) 139/59  Pulse: 66 70  Resp: 18 20  Temp: 98 F (36.7 C) 98 F (36.7 C)  SpO2: 95% 94%    Exam:  Constitutional:  . The patient is awake, alert, and oriented x 3. No acute distress. Respiratory:  . No increased work of breathing. . No wheezes, rales, or rhonchi . No tactile fremitus Cardiovascular:  . Regular rate and rhythm . No murmurs, ectopy, or gallups. . No lateral PMI. No thrills. Abdomen:  . Abdomen is soft, non-tender, non-distended . No hernias, masses, or organomegaly . Normoactive bowel sounds.  Musculoskeletal:  . No cyanosis, clubbing, or edema Skin:  . No rashes, lesions, ulcers . palpation of skin: no induration or nodules Neurologic:  . CN 2-12 intact .  Sensation all 4 extremities intact Psychiatric:  . Mental status o Mood, affect appropriate o Orientation to person, place, time  . judgment and insight appear intact  Discharge Instructions  Discharge Instructions    Activity as tolerated - No restrictions   Complete by: As directed    Call MD for:  redness, tenderness, or signs of infection (pain, swelling, redness, odor or green/yellow discharge  around incision site)   Complete by: As directed    Call MD for:  severe uncontrolled pain   Complete by: As directed    Call MD for:  temperature >100.4   Complete by: As directed    Diet - low sodium heart healthy   Complete by: As directed    Discharge instructions   Complete by: As directed    Discharge to home with home health PT/RN and a Rolling walker. Weight bearing as tolerated to left leg. Remove Provena dressing on 05/01/2020 and replace it with honeycomb dressing. Keep dressing clean and dry at all times. Follow up with [place] in 2 weeks. Follow up with PCP in 7-10 days.   Discharge wound care:   Complete by: As directed    Keep dressing clean and dry at all times. Remove Provena dressing on 05/01/2020 and replace it with honeycomb dressing.   Increase activity slowly   Complete by: As directed      Allergies as of 04/22/2020      Reactions   Demerol [meperidine]    Septra [sulfamethoxazole-trimethoprim]       Medication List    TAKE these medications   amitriptyline 10 MG tablet Commonly known as: ELAVIL Take 20 mg by mouth at bedtime.   amLODipine 10 MG tablet Commonly known as: NORVASC Take 10 mg by mouth daily.   bisacodyl 10 MG suppository Commonly known as: DULCOLAX Place 1 suppository (10 mg total) rectally daily as needed for moderate constipation.   docusate sodium 100 MG capsule Commonly known as: COLACE Take 1 capsule (100 mg total) by mouth 2 (two) times daily.   enoxaparin 40 MG/0.4ML injection Commonly known as: LOVENOX Inject 0.4 mLs (40 mg total) into the skin daily for 14 days. Start taking on: Apr 23, 2020   erythromycin ophthalmic ointment Place 1 application into the left eye 3 (three) times daily.   feeding supplement (ENSURE ENLIVE) Liqd Take 237 mLs by mouth 2 (two) times daily between meals.   ferrous [id number] C-folic acid capsule Commonly known as: TRINSICON / FOLTRIN Take 1 capsule by mouth 2 (two)  times daily after a meal.   HYDROcodone-acetaminophen 5-325 MG tablet Commonly known as: NORCO/VICODIN Take 1-2 tablets by mouth every 4 (four) hours as needed for moderate pain (pain score 4-6).   lidocaine 5 % Commonly known as: LIDODERM Place 1 patch onto the skin daily. Remove & Discard patch within 12 hours or as directed by MD Start taking on: Apr 23, 2020   multivitamin with minerals Tabs tablet Take 1 tablet by mouth daily. Start taking on: Apr 23, 2020            Durable Medical Equipment  (From admission, onward)         Start     Ordered   04/22/20 1338  For home use only DME Walker rolling  Once    Question Answer Comment  Walker: With 5 Inch Wheels   Patient needs a walker to treat with the following condition Fracture      04/22/20 1337  04/22/20 1333  DME 3-in-1  Once     04/22/20 1338   04/22/20 1333  DME Walker  Once    Question Answer Comment  Walker: With 5 Inch Wheels   Patient needs a walker to treat with the following condition Ambulatory dysfunction      04/22/20 1338           Discharge Care Instructions  (From admission, onward)         Start     Ordered   04/22/20 0000  Discharge wound care:    Comments: Keep dressing clean and dry at all times. Remove Provena dressing on 05/01/2020 and replace it with honeycomb dressing.   04/22/20 1338         Allergies  Allergen Reactions  . Demerol [Meperidine]   . Septra [Sulfamethoxazole-Trimethoprim]     The results of significant diagnostics from this hospitalization (including imaging, microbiology, ancillary and laboratory) are listed below for reference.    Significant Diagnostic Studies: DG Chest 1 View  Result Date: 04/19/2020 CLINICAL DATA:  Fall EXAM: CHEST  1 VIEW COMPARISON:  None. FINDINGS: Heart and mediastinal contours are within normal limits. No focal opacities or effusions. No acute bony abnormality. IMPRESSION: No active disease. Electronically Signed   By: Rolm Baptise M.D.   On: 04/19/2020 22:18   DG HIP OPERATIVE UNILAT W OR W/O PELVIS LEFT  Result Date: 04/20/2020 CLINICAL DATA:  86 year old female with left hip arthroplasty. EXAM: OPERATIVE left HIP (WITH PELVIS IF PERFORMED) 1 VIEWS TECHNIQUE: Fluoroscopic spot image(s) were submitted for interpretation post-operatively. COMPARISON:  Radiograph dated 04/20/2020. FINDINGS: Two intraoperative fluoroscopic images of the left hip provided. The total fluoroscopic time is 0.2 second. There is a total left hip arthroplasty. No immediate complication identified. IMPRESSION: Status post total left hip arthroplasty. Electronically Signed   By: Anner Crete M.D.   On: 04/20/2020 21:08   DG HIP UNILAT W OR W/O PELVIS 2-3 VIEWS LEFT  Result Date: 04/20/2020 CLINICAL DATA:  Postop EXAM: DG HIP (WITH OR WITHOUT PELVIS) 2-3V LEFT COMPARISON:  04/19/2020 FINDINGS: Interval left hip replacement with normal alignment and intact hardware. Gas in the soft tissues consistent with recent surgery. IMPRESSION: Interval left hip replacement with expected postsurgical change Electronically Signed   By: Donavan Foil M.D.   On: 04/20/2020 21:15   DG Hip Unilat With Pelvis 2-3 Views Left  Result Date: 04/19/2020 CLINICAL DATA:  Fall EXAM: DG HIP (WITH OR WITHOUT PELVIS) 2-3V LEFT COMPARISON:  None FINDINGS: There is a left femoral neck fracture with mild impaction. No subluxation or dislocation. No additional acute bony abnormality. IMPRESSION: Left femoral neck fracture with impaction. Electronically Signed   By: Rolm Baptise M.D.   On: 04/19/2020 22:17    Microbiology: Recent Results (from the past 240 hour(s))  Respiratory Panel by RT PCR (Flu A&B, Covid) - Nasopharyngeal Swab     Status: None   Collection Time: 04/19/20 11:44 PM   Specimen: Nasopharyngeal Swab  Result Value Ref Range Status   SARS Coronavirus 2 by RT PCR NEGATIVE NEGATIVE Final    Comment: (NOTE) SARS-CoV-2 target nucleic acids are NOT DETECTED. The  SARS-CoV-2 RNA is generally detectable in upper respiratoy specimens during the acute phase of infection. The lowest concentration of SARS-CoV-2 viral copies this assay can detect is 131 copies/mL. A negative result does not preclude SARS-Cov-2 infection and should not be used as the sole basis for treatment or other patient management decisions. A negative result may  occur with  improper specimen collection/handling, submission of specimen other than nasopharyngeal swab, presence of viral mutation(s) within the areas targeted by this assay, and inadequate number of viral copies (<131 copies/mL). A negative result must be combined with clinical observations, patient history, and epidemiological information. The expected result is Negative. Fact Sheet for Patients:  PinkCheek.be Fact Sheet for Healthcare Providers:  GravelBags.it This test is not yet ap proved or cleared by the [place] FDA and  has been authorized for detection and/or diagnosis of SARS-CoV-2 by FDA under an Emergency Use Authorization (EUA). This EUA will remain  in effect (meaning this test can be used) for the duration of the COVID-19 declaration under Section 564(b)(1) of the Act, 21 U.S.C. section 360bbb-3(b)(1), unless the authorization is terminated or revoked sooner.    Influenza A by PCR NEGATIVE NEGATIVE Final   Influenza B by PCR NEGATIVE NEGATIVE Final    Comment: (NOTE) The Xpert Xpress SARS-CoV-2/FLU/RSV assay is intended as an aid in  the diagnosis of influenza from Nasopharyngeal swab specimens and  should not be used as a sole basis for treatment. Nasal washings and  aspirates are unacceptable for Xpert Xpress SARS-CoV-2/FLU/RSV  testing. Fact Sheet for Patients: PinkCheek.be Fact Sheet for Healthcare Providers: GravelBags.it This test is not yet approved or cleared by the [place] FDA and  has been authorized for detection and/or diagnosis of SARS-CoV-2 by  FDA under an Emergency Use Authorization (EUA). This EUA will remain  in effect (meaning this test can be used) for the duration of the  Covid-19 declaration under Section 564(b)(1) of the Act, 21  U.S.C. section 360bbb-3(b)(1), unless the authorization is  terminated or revoked. Performed at [facility], [address], [place], [place] [address]   Surgical PCR screen     Status: None   Collection Time: 04/20/20  3:19 AM   Specimen: Nasal Mucosa; Nasal Swab  Result Value Ref Range Status   MRSA, PCR NEGATIVE NEGATIVE Final   Staphylococcus aureus NEGATIVE NEGATIVE Final    Comment: (NOTE) The Xpert SA Assay (FDA approved for NASAL specimens in patients 41 years of age and older), is one component of a comprehensive surveillance program. It is not intended to diagnose infection nor to guide or monitor treatment. Performed at [facility], [address] [place], [place] [address]      Labs: Basic Metabolic Panel: Recent Labs  Lab 04/19/20 2134 04/20/20 0344 04/21/20 0548  NA 140 140 139  K 4.5 4.1 4.2  CL 107 109 112*  CO2 29 25 21*  GLUCOSE 104* 122* 150*  BUN 17 14 16   CREATININE 0.97 0.86 0.90  CALCIUM 9.1 8.8* 7.7*   Liver Function Tests: No results for input(s): AST, ALT, ALKPHOS, BILITOT, PROT, ALBUMIN in the last 168 hours. No results for input(s): LIPASE, AMYLASE in the last 168 hours. No results for input(s): AMMONIA in the last 168 hours. CBC: Recent Labs  Lab 04/19/20 2134 04/20/20 0344 04/21/20 0548 04/22/20 0504  WBC 48.4* 41.2* 28.6* 27.7*  HGB 11.0* 11.1* 9.3* 8.8*  HCT 34.4* 34.3* 29.4* 28.0*  MCV 92.0 89.6 93.3 92.4  PLT 163 160 134* 129*   Cardiac Enzymes: No results for input(s): CKTOTAL, CKMB, CKMBINDEX, TROPONINI in the last 168 hours. BNP: BNP (last 3 results) No results for input(s): BNP in the last 8760 hours.  ProBNP (last  3 results) No results for input(s): PROBNP in the last 8760 hours.  CBG: No results for input(s): GLUCAP  in the last 168 hours.  Principal Problem:   Fracture of femoral neck, left, closed (HCC) Active Problems:   Fibromyalgia   IBS (irritable bowel syndrome)   HTN (hypertension)   Accidental fall   Preoperative clearance   Closed left hip fracture, initial encounter ([place])   Left bundle branch block   Leukemia ([place])  Time coordinating discharge: 38 minutes.  Signed:        Adolph Clutter, DO [facility]  04/22/2020, 1:50 PM

## 2020-04-22 NOTE — TOC Initial Note (Signed)
Transition of Care [facility]) - Initial/Assessment Note    Patient Details  Name: Lindsay [name] MRN: [id number] Date of Birth: 04-Jan-1934  Transition of Care [facility]) CM/SW Contact:    Su Hilt, RN Phone Number: 04/22/2020, 12:19 PM  Clinical Narrative:                 Met with the patient and her daughter in the room to discuss DC plan and needs The patient lives at home with her daughter She needs a RW and a 3 in 1 I notified [name] with [employer] both by phone and by email requesting the DME She is [facility] with [facility] The daughter is to take her daughter (the patients grand daughter) to have surgery on Friday and will not be able to be at home with the patient I explained to the daughter and the patient if there is not a medical reason to stay in the hospital then insurance would deny and they would have a large hospital bill,  She stated understanding, I also explained that the physician may DC her today since she is medically cleared, awaiting clearance from Ortho  Expected Discharge Plan: [place] Barriers to Discharge: Continued Medical Work up   Patient Goals and CMS Choice Patient states their goals for this hospitalization and ongoing recovery are:: go home      Expected Discharge Plan and Services Expected Discharge Plan: [place]   Discharge Planning Services: CM Consult   Living arrangements for the past 2 months: Single Family Home                 DME Arranged: 3-N-1, Walker rolling DME Agency: [employer] Date DME Agency Contacted: 04/22/20 Time DME Agency Contacted: 1217 Representative spoke with at DME Agency: [name] HH Arranged: PT, OT [place] Agency: [facility] [employer]) Date [place]: 04/22/20 Time [place]: 1217 Representative spoke with at [place]  Prior Living Arrangements/Services Living arrangements for the past 2 months: [place] Lives  with:: Adult Children(daughter) Patient language and need for interpreter reviewed:: Yes Do you feel safe going back to the place where you live?: Yes      Need for Family Participation in Patient Care: No (Comment) Care giver support system in place?: Yes (comment)   Criminal Activity/Legal Involvement Pertinent to Current Situation/Hospitalization: No - Comment as needed  Activities of Daily Living Home Assistive Devices/Equipment: None ADL Screening (condition at time of admission) Patient's cognitive ability adequate to safely complete daily activities?: Yes Is the patient deaf or have difficulty hearing?: Yes(right ear per pt) Does the patient have difficulty seeing, even when wearing glasses/contacts?: No Does the patient have difficulty concentrating, remembering, or making decisions?: No Patient able to express need for assistance with ADLs?: Yes Does the patient have difficulty dressing or bathing?: No Independently performs ADLs?: No Communication: Independent Dressing (OT): Needs assistance Is this a change from baseline?: Change from baseline, expected to last <3days Grooming: Needs assistance Is this a change from baseline?: Change from baseline, expected to last <3 days Feeding: Independent Bathing: Needs assistance Is this a change from baseline?: Change from baseline, expected to last <3 days Toileting: Needs assistance Is this a change from baseline?: Change from baseline, expected to last <3 days In/Out Bed: Needs assistance Is this a change from baseline?: Change from baseline, expected to last <3 days Walks in Home: Needs assistance Is this a  change from baseline?: Change from baseline, expected to last <3 days Does the patient have difficulty walking or climbing stairs?: Yes Weakness of Legs: Left Weakness of Arms/Hands: Both(arthritis)  Permission Sought/Granted   Permission granted to share information with : Yes, Verbal Permission Granted               Emotional Assessment Appearance:: Appears stated age Attitude/Demeanor/Rapport: Engaged Affect (typically observed): Appropriate Orientation: : Oriented to Self, Oriented to Place, Oriented to  Time, Oriented to Situation Alcohol / Substance Use: Not Applicable Psych Involvement: No (comment)  Admission diagnosis:  Fall [W19.XXXA] Closed fracture of left hip, initial encounter ([place]) [S72.002A] Closed left hip fracture, initial encounter [facility]) [S72.002A] Patient Active Problem List   Diagnosis Date Noted  . HTN (hypertension) 04/19/2020  . Accidental fall 04/19/2020  . Fracture of femoral neck, left, closed ([place]) 04/19/2020  . Preoperative clearance 04/19/2020  . Closed left hip fracture, initial encounter ([place]) 04/19/2020  . Left bundle branch block 04/19/2020  . Leukemia ([place]) 04/19/2020  . Arthritis 03/10/2020  . Calculus of kidney 03/10/2020  . Diverticulosis 03/10/2020  . Fibromyalgia 03/10/2020  . IBS (irritable bowel syndrome) 03/10/2020   PCP:  [name], MD Pharmacy:   [place], [place] - [place] [address] Phone: [phone] Fax: [phone]     Social Determinants of Health (SDOH) Interventions    Readmission Risk Interventions No flowsheet data found.

## 2020-04-22 NOTE — Care Management Important Message (Signed)
Important Message  Patient Details  Name: Lindsay [name] MRN: [id number] Date of Birth: 07-20-1934   Medicare Important Message Given:  Yes     [name] A [name] 04/22/2020, 10:33 AM

## 2020-04-22 NOTE — Progress Notes (Signed)
Physical Therapy Treatment Patient Details Name: Lindsay [name] MRN: [id number] DOB: [birth year] Today's Date: 04/22/2020    History of Present Illness Pt is an 86 yo female s/p L hemiarthroplasty, anterior approach, WBAT. PMH of HTN, OA, leukemia since May 2019    PT Comments    Pt ready to return to bed but agrees to gait prior.  Stood and walked 60; before fatigue.  Wheelchair used to get pt to stairs for stair training.  She is able to go up/down with bilateral rails as at home.  Daughter in for stair and gait training.  To commode prior to return to bed with min a x 1 for LE's.  Pt progressing well towards goals.   Follow Up Recommendations  Home health PT;Supervision for mobility/OOB     Equipment Recommendations  Rolling walker with 5" wheels;3in1 (PT)    Recommendations for Other Services       Precautions / Restrictions Precautions Precautions: Fall;Anterior Hip Restrictions Weight Bearing Restrictions: Yes LLE Weight Bearing: Weight bearing as tolerated    Mobility  Bed Mobility Overal bed mobility: Needs Assistance Bed Mobility: Sit to Supine     Supine to sit: Min guard Sit to supine: Min assist   General bed mobility comments: very light assist for LLE due to pain  Transfers Overall transfer level: Needs assistance Equipment used: Rolling walker (2 wheeled) Transfers: Sit to/from Stand Sit to Stand: Min guard;Supervision            Ambulation/Gait Ambulation/Gait assistance: Counsellor (Feet): 60 Feet Assistive device: Rolling walker (2 wheeled) Gait Pattern/deviations: Step-through pattern;Decreased step length - right;Decreased step length - left Gait velocity: decreased   General Gait Details: slow, cautious gait, no LOB noted   Stairs Stairs: Yes Stairs assistance: Min guard Stair Management: Two rails;Step to pattern Number of Stairs: 4 General stair comments: cues for sequencing, daughter in for  training.   Wheelchair Mobility    Modified Rankin (Stroke Patients Only)       Balance Overall balance assessment: Needs assistance Sitting-balance support: Feet supported;No upper extremity supported Sitting balance-Leahy Scale: Good Sitting balance - Comments: Steady static sitting, reaching within BOS.   Standing balance support: During functional activity;Bilateral upper extremity supported Standing balance-Leahy Scale: Fair                              Cognition Arousal/Alertness: Awake/alert Behavior During Therapy: WFL for tasks assessed/performed Overall Cognitive Status: Within Functional Limits for tasks assessed                                        Exercises Other Exercises Other Exercises: heel slides, hip abduction, SLR x10 of RLE with PT tactile cues    General Comments        Pertinent Vitals/Pain Pain Assessment: Faces Faces Pain Scale: Hurts little more Pain Location: L hip with mobility Pain Descriptors / Indicators: Guarding;Sore Pain Intervention(s): Limited activity within patient's tolerance;Monitored during session;Repositioned    Home Living                      Prior Function            PT Goals (current goals can now be found in the care plan section) Progress towards PT goals: Progressing toward goals    Frequency  BID      PT Plan Current plan remains appropriate    Co-evaluation              AM-PAC PT "6 Clicks" Mobility   Outcome Measure  Help needed turning from your back to your side while in a flat bed without using bedrails?: None Help needed moving from lying on your back to sitting on the side of a flat bed without using bedrails?: A Little Help needed moving to and from a bed to a chair (including a wheelchair)?: A Little Help needed standing up from a chair using your arms (e.g., wheelchair or bedside chair)?: A Little Help needed to walk in hospital room?: A  Little Help needed climbing 3-5 steps with a railing? : A Little 6 Click Score: 19    End of Session Equipment Utilized During Treatment: Gait belt Activity Tolerance: Patient tolerated treatment well Patient left: in bed;with call bell/phone within reach;with family/visitor present;with bed alarm set;with SCD's reapplied Nurse Communication: Mobility status Pain - Right/Left: Left Pain - part of body: Hip     Time: [id number] PT Time Calculation (min) (ACUTE ONLY): 18 min  Charges:  $Gait Training: 8-22 mins $Therapeutic Exercise: 8-22 mins                    Chesley Noon, PTA 04/22/20, 11:55 AM

## 2020-04-22 NOTE — Progress Notes (Signed)
   Subjective: 2 Days Post-Op Procedure(s) (LRB): ARTHROPLASTY BIPOLAR HIP (HEMIARTHROPLASTY) (Left) Patient reports pain as mild.   Patient is well, and has had no acute complaints or problems Denies any CP, SOB, ABD pain. We will continue therapy today.  Plan is to go Home after hospital stay.  Objective: Vital signs in last 24 hours: Temp:  [98 F (36.7 C)] 98 F (36.7 C) (05/06 0824) Pulse Rate:  [66-74] 70 (05/06 0824) Resp:  [18-20] 20 (05/06 0824) BP: (135-145)/(43-59) 139/59 (05/06 0824) SpO2:  [94 %-98 %] 94 % (05/06 0824)  Intake/Output from previous day: 05/05 0701 - 05/06 0700 In: 120 [P.O.:120] Out: -  Intake/Output this shift: No intake/output data recorded.  Recent Labs    04/19/20 2134 04/20/20 0344 04/21/20 0548 04/22/20 0504  HGB 11.0* 11.1* 9.3* 8.8*   Recent Labs    04/21/20 0548 04/22/20 0504  WBC 28.6* 27.7*  RBC 3.15* 3.03*  HCT 29.4* 28.0*  PLT 134* 129*   Recent Labs    04/20/20 0344 04/21/20 0548  NA 140 139  K 4.1 4.2  CL 109 112*  CO2 25 21*  BUN 14 16  CREATININE 0.86 0.90  GLUCOSE 122* 150*  CALCIUM 8.8* 7.7*   Recent Labs    04/19/20 2344  INR 0.9    EXAM General - Patient is Alert, Appropriate and Oriented Extremity - Neurovascular intact Sensation intact distally Intact pulses distally Dorsiflexion/Plantar flexion intact No cellulitis present Compartment soft Dressing - dressing C/D/I and no drainage, prevena intact with out drainage Motor Function - intact, moving foot and toes well on exam.   Past Medical History:  Diagnosis Date  . Arthritis    RHEUMATOID  . Edema    MILD ANKLE OCCAS  . Fibromyalgia   . GERD (gastroesophageal reflux disease)   . Hypertension   . Kidney stones   . RLS (restless legs syndrome)     Assessment/Plan:   2 Days Post-Op Procedure(s) (LRB): ARTHROPLASTY BIPOLAR HIP (HEMIARTHROPLASTY) (Left) Principal Problem:   Fracture of femoral neck, left, closed (HCC) Active  Problems:   Fibromyalgia   IBS (irritable bowel syndrome)   HTN (hypertension)   Accidental fall   Preoperative clearance   Closed left hip fracture, initial encounter (HCC)   Left bundle branch block   Leukemia (HCC)  Estimated body mass index is 19.49 kg/m as calculated from the following:   Height as of this encounter: 5\' 3"  (1.6 m).   Weight as of this encounter: 49.9 kg. Advance diet Up with therapy  Needs BM Acute post op blood loss anemia - hgb 8.8, continue with iron supplement CM to assist with discharge to home with HHPT. Family will be able to help patient until Saturday and would benefit from discharge on Saturday   Follow up with [facility] orthopedics in 2 weeks for staple removal Please remove provena negative pressure dressing on 05/01/2020 and apply honey comb dressing. Keep dressing clean and dry at all times. Lovenox 40 mg SQ daily x 14 days    DVT Prophylaxis - Lovenox, Foot Pumps and TED hose Weight-Bearing as tolerated to left leg   T. Rachelle Hora, PA-C [place] 04/22/2020, 11:38 AM

## 2020-11-29 ENCOUNTER — Other Ambulatory Visit: Payer: Medicare Other

## 2020-11-29 ENCOUNTER — Encounter: Payer: Medicare Other | Admitting: Oncology

## 2020-12-08 ENCOUNTER — Inpatient Hospital Stay: Payer: Medicare Other | Admitting: Internal Medicine

## 2020-12-08 ENCOUNTER — Inpatient Hospital Stay: Payer: Medicare Other

## 2020-12-27 ENCOUNTER — Inpatient Hospital Stay: Payer: Medicare Other

## 2020-12-27 ENCOUNTER — Inpatient Hospital Stay: Payer: Medicare Other | Admitting: Internal Medicine

## 2021-01-19 ENCOUNTER — Inpatient Hospital Stay: Payer: Medicare Other

## 2021-01-19 ENCOUNTER — Inpatient Hospital Stay: Payer: Medicare Other | Admitting: Internal Medicine

## 2021-02-14 ENCOUNTER — Encounter: Payer: Self-pay | Admitting: Internal Medicine

## 2021-02-14 ENCOUNTER — Inpatient Hospital Stay: Payer: Medicare Other

## 2021-02-14 ENCOUNTER — Encounter (INDEPENDENT_AMBULATORY_CARE_PROVIDER_SITE_OTHER): Payer: Self-pay

## 2021-02-14 ENCOUNTER — Other Ambulatory Visit: Payer: Self-pay

## 2021-02-14 ENCOUNTER — Inpatient Hospital Stay: Payer: Medicare Other | Attending: Internal Medicine | Admitting: Internal Medicine

## 2021-02-14 DIAGNOSIS — D72820 Lymphocytosis (symptomatic): Secondary | ICD-10-CM | POA: Diagnosis not present

## 2021-02-14 DIAGNOSIS — I10 Essential (primary) hypertension: Secondary | ICD-10-CM | POA: Diagnosis not present

## 2021-02-14 DIAGNOSIS — G2581 Restless legs syndrome: Secondary | ICD-10-CM | POA: Diagnosis not present

## 2021-02-14 DIAGNOSIS — Z7901 Long term (current) use of anticoagulants: Secondary | ICD-10-CM | POA: Insufficient documentation

## 2021-02-14 DIAGNOSIS — D649 Anemia, unspecified: Secondary | ICD-10-CM | POA: Insufficient documentation

## 2021-02-14 DIAGNOSIS — R590 Localized enlarged lymph nodes: Secondary | ICD-10-CM | POA: Diagnosis not present

## 2021-02-14 DIAGNOSIS — C9110 Chronic lymphocytic leukemia of B-cell type not having achieved remission: Secondary | ICD-10-CM | POA: Insufficient documentation

## 2021-02-14 DIAGNOSIS — Z79899 Other long term (current) drug therapy: Secondary | ICD-10-CM | POA: Insufficient documentation

## 2021-02-14 LAB — COMPREHENSIVE METABOLIC PANEL
ALT: 10 U/L (ref 0–44)
AST: 19 U/L (ref 15–41)
Albumin: 4.2 g/dL (ref 3.5–5.0)
Alkaline Phosphatase: 100 U/L (ref 38–126)
Anion gap: 8 (ref 5–15)
BUN: 25 mg/dL — ABNORMAL HIGH (ref 8–23)
CO2: 26 mmol/L (ref 22–32)
Calcium: 8.8 mg/dL — ABNORMAL LOW (ref 8.9–10.3)
Chloride: 105 mmol/L (ref 98–111)
Creatinine, Ser: 0.88 mg/dL (ref 0.44–1.00)
GFR, Estimated: >60 mL/min (ref >60)
Glucose, Bld: 105 mg/dL — ABNORMAL HIGH (ref 70–99)
Potassium: 4.5 mmol/L (ref 3.5–5.1)
Sodium: 139 mmol/L (ref 135–145)
Total Bilirubin: 0.6 mg/dL (ref 0.3–1.2)
Total Protein: 6.7 g/dL (ref 6.5–8.1)

## 2021-02-14 LAB — CBC WITH DIFFERENTIAL/PLATELET
Abs Immature Granulocytes: 0.03 10*3/uL (ref 0.00–0.07)
Basophils Absolute: 0.1 10*3/uL (ref 0.0–0.1)
Basophils Relative: 0 %
Eosinophils Absolute: 0.6 10*3/uL — ABNORMAL HIGH (ref 0.0–0.5)
Eosinophils Relative: 2 %
HCT: 30.2 % — ABNORMAL LOW (ref 36.0–46.0)
Hemoglobin: 9.4 g/dL — ABNORMAL LOW (ref 12.0–15.0)
Immature Granulocytes: 0 %
Lymphocytes Relative: 88 %
Lymphs Abs: 24.7 10*3/uL — ABNORMAL HIGH (ref 0.7–4.0)
MCH: 29.4 pg (ref 26.0–34.0)
MCHC: 31.1 g/dL (ref 30.0–36.0)
MCV: 94.4 fL (ref 80.0–100.0)
Monocytes Absolute: 0.7 10*3/uL (ref 0.1–1.0)
Monocytes Relative: 3 %
Neutro Abs: 1.8 10*3/uL (ref 1.7–7.7)
Neutrophils Relative %: 7 %
Platelets: 177 10*3/uL (ref 150–400)
RBC: 3.2 MIL/uL — ABNORMAL LOW (ref 3.87–5.11)
RDW: 14.9 % (ref 11.5–15.5)
Smear Review: NORMAL
WBC: 27.8 10*3/uL — ABNORMAL HIGH (ref 4.0–10.5)
nRBC: 0 % (ref 0.0–0.2)

## 2021-02-14 LAB — IRON AND TIBC
Iron: 54 ug/dL (ref 28–170)
Saturation Ratios: 15 % (ref 10.4–31.8)
TIBC: 356 ug/dL (ref 250–450)
UIBC: 302 ug/dL

## 2021-02-14 LAB — LACTATE DEHYDROGENASE: LDH: 145 U/L (ref 98–192)

## 2021-02-14 LAB — TECHNOLOGIST SMEAR REVIEW: Plt Morphology: ADEQUATE

## 2021-02-14 LAB — FERRITIN: Ferritin: 16 ng/mL (ref 11–307)

## 2021-02-14 NOTE — Assessment & Plan Note (Addendum)
#  Lymphocytosis ~20-25,000 anemia hemoglobin 9-10; normal platelets.  Clinically highly suspicious for CLL.  The other possible etiologies include-mantle cell lymphoma/other B/T cell lymphoproliferative disorders.    #From a diagnostic perspective I would recommend peripheral blood flow cytometry; CBC CMP LDH; review of peripheral smear.  FISH panel; I GVH mutation panel.  Recommend a PET scan for further evaluation.  Hold off a bone marrow biopsy at this time.   # Given the ongoing fatigue/mild to moderate anemia and clinically concerned the patient is symptomatic from her underlying likely malignancy.  # Fatigue: Likely secondary to underlying anemia/CLL.  Rule out other causes of anemia check iron studies/LDH haptoglobin.  #Discussed with the patient that in general patient CLL cannot be cured; however treatments are very effective in helping treating symptoms.  Await above work-up to decide on treatment options if needed.  Thank you Dr.Hedrick for allowing me to participate in the care of your pleasant patient. Please do not hesitate to contact me with questions or concerns in the interim.  # DISPOSITION: # labs today-  # PET scan ASAP # follow up 1-2 days after the PET scan-Dr.B

## 2021-02-14 NOTE — Progress Notes (Signed)
[place] CONSULT NOTE  Patient Care Team: [name], MD as PCP - General (Family Medicine)  CHIEF COMPLAINTS/PURPOSE OF CONSULTATION: Lymphocytosis  #  Oncology History Overview Note  # LYMPHOCYTOSIS [25,K]/ANEMIA 9-10]; platelets-N   CLL (chronic lymphocytic leukemia) ([place])  02/14/2021 Initial Diagnosis   CLL (chronic lymphocytic leukemia) ([place])      HISTORY OF PRESENTING ILLNESS:  Lindsay [name] 87 y.o.  female is fairly active at baseline has been referred to [place] for further evaluation recommendations for lymphocytosis/anemia.  Patient had a fall in May 2021; she had a hip fracture for which she had ORIF.   Patient has recovered fairly well from surgery.  However she is noted to have worsening fatigue.  Patient denies any blood in stools or black or stools.  Denies any fevers or chills.  No night sweats.  She notes to have lymphadenopathy in her underarms and also in the neck.  Review of Systems  Constitutional: Positive for malaise/fatigue and weight loss. Negative for chills, diaphoresis and fever.  HENT: Negative for nosebleeds and sore throat.   Eyes: Negative for double vision.  Respiratory: Negative for cough, hemoptysis, sputum production, shortness of breath and wheezing.   Cardiovascular: Negative for chest pain, palpitations, orthopnea and leg swelling.  Gastrointestinal: Negative for abdominal pain, blood in stool, constipation, diarrhea, heartburn, melena, nausea and vomiting.  Genitourinary: Negative for dysuria, frequency and urgency.  Musculoskeletal: Positive for back pain and joint pain.  Skin: Negative.  Negative for itching and rash.  Neurological: Negative for dizziness, tingling, focal weakness, weakness and headaches.  Endo/Heme/Allergies: Does not bruise/bleed easily.  Psychiatric/Behavioral: Negative for depression. The patient is not nervous/anxious and does not have insomnia.      MEDICAL HISTORY:  Past Medical History:   Diagnosis Date  . Arthritis    RHEUMATOID  . Edema    MILD ANKLE OCCAS  . Fibromyalgia   . GERD (gastroesophageal reflux disease)   . Hypertension   . Kidney stones   . RLS (restless legs syndrome)     SURGICAL HISTORY: Past Surgical History:  Procedure Laterality Date  . ABDOMINAL HYSTERECTOMY    . CATARACT EXTRACTION W/PHACO Left 02/07/2016   Procedure: CATARACT EXTRACTION PHACO AND INTRAOCULAR LENS PLACEMENT (IOC);  Surgeon: Estill Cotta, MD;  Location: [facility] ORS;  Service: Ophthalmology;  Laterality: Left;  [place] 01:33 AP% 24.7 CDE 39.57 fluid pack lot # [id number]  . EXTRACORPOREAL SHOCK WAVE LITHOTRIPSY Right 07/03/2019   Procedure: EXTRACORPOREAL SHOCK WAVE LITHOTRIPSY (ESWL);  Surgeon: Royston Cowper, MD;  Location: [facility] ORS;  Service: Urology;  Laterality: Right;  . EYE SURGERY    . HIP ARTHROPLASTY Left 04/20/2020   Procedure: ARTHROPLASTY BIPOLAR HIP (HEMIARTHROPLASTY);  Surgeon: Hessie Knows, MD;  Location: [facility] ORS;  Service: Orthopedics;  Laterality: Left;  . LITHOTRIPSY    . TONSILLECTOMY      SOCIAL HISTORY: Social History   Socioeconomic History  . Marital status: Married    Spouse name: Not on file  . Number of children: Not on file  . Years of education: Not on file  . Highest education level: Not on file  Occupational History  . Not on file  Tobacco Use  . Smoking status: Never Smoker  . Smokeless tobacco: Never Used  Substance and Sexual Activity  . Alcohol use: No  . Drug use: Never  . Sexual activity: Not on file  Other Topics Concern  . Not on file  Social History Narrative   Lives with daughter,  in [place]. Remote smoking; no alcohol. 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    Social Determinants of Health   Financial Resource Strain: Not on file  Food Insecurity: Not on file  Transportation Needs: Not on file  Physical Activity: Not on file  Stress: Not on file  Social Connections: Not on file  Intimate Partner Violence: Not on file    FAMILY  HISTORY: Family History  Problem Relation Age of Onset  . Cancer Mother        uterine cancer    ALLERGIES:  is allergic to demerol [meperidine] and septra [sulfamethoxazole-trimethoprim].  MEDICATIONS:  Current Outpatient Medications  Medication Sig Dispense Refill  . amitriptyline (ELAVIL) 10 MG tablet Take 20 mg by mouth at bedtime.    Marland Kitchen amLODipine (NORVASC) 10 MG tablet Take 10 mg by mouth daily.    . bisacodyl (DULCOLAX) 10 MG suppository Place 1 suppository (10 mg total) rectally daily as needed for moderate constipation. 12 suppository 0  . docusate sodium (COLACE) 100 MG capsule Take 1 capsule (100 mg total) by mouth 2 (two) times daily. 10 capsule 0  . enoxaparin (LOVENOX) 40 MG/0.4ML injection Inject 0.4 mLs (40 mg total) into the skin daily for 14 days. 5.6 mL 0  . erythromycin ophthalmic ointment Place 1 application into the left eye 3 (three) times daily.    . feeding supplement, ENSURE ENLIVE, (ENSURE ENLIVE) LIQD Take 237 mLs by mouth 2 (two) times daily between meals. 237 mL 12  . ferrous [id number] C-folic acid (TRINSICON / FOLTRIN) capsule Take 1 capsule by mouth 2 (two) times daily after a meal. 60 capsule 0  . HYDROcodone-acetaminophen (NORCO/VICODIN) 5-325 MG tablet Take 1-2 tablets by mouth every 4 (four) hours as needed for moderate pain (pain score 4-6). 30 tablet 0  . lidocaine (LIDODERM) 5 % Place 1 patch onto the skin daily. Remove & Discard patch within 12 hours or as directed by MD 30 patch 0  . Multiple Vitamin (MULTIVITAMIN WITH MINERALS) TABS tablet Take 1 tablet by mouth daily. 30 tablet 0   No current facility-administered medications for this visit.      Marland Kitchen  PHYSICAL EXAMINATION: ECOG PERFORMANCE STATUS: 1 - Symptomatic but completely ambulatory  Vitals:   02/14/21 1414  BP: (!) 174/49  Pulse: 63  Resp: 20  Temp: 99 F (37.2 C)   Filed Weights   02/14/21 1414  Weight: 106 lb (48.1 kg)    Physical Exam Constitutional:       Comments: Accompanied by her daughter.  Patient ambulating independently.  HENT:     Head: Normocephalic and atraumatic.     Mouth/Throat:     Mouth: Oropharynx is clear and moist.     Pharynx: No oropharyngeal exudate.  Eyes:     Pupils: Pupils are equal, round, and reactive to light.  Neck:     Comments: Moderate lymphadenopathy bilateral neck and also bilateral axilla. Cardiovascular:     Rate and Rhythm: Normal rate and regular rhythm.  Pulmonary:     Effort: Pulmonary effort is normal. No respiratory distress.     Breath sounds: Normal breath sounds. No wheezing.  Abdominal:     General: Bowel sounds are normal. There is no distension.     Palpations: Abdomen is soft. There is no mass.     Tenderness: There is no abdominal tenderness. There is no guarding or rebound.  Musculoskeletal:        General: No tenderness or edema. Normal range of motion.  Cervical back: Normal range of motion and neck supple.  Skin:    General: Skin is warm.  Neurological:     Mental Status: She is alert and oriented to person, place, and time.  Psychiatric:        Mood and Affect: Affect normal.      LABORATORY DATA:  I have reviewed the data as listed Lab Results  Component Value Date   WBC 27.8 (H) 02/14/2021   HGB 9.4 (L) 02/14/2021   HCT 30.2 (L) 02/14/2021   MCV 94.4 02/14/2021   PLT 177 02/14/2021   Recent Labs    04/19/20 2134 04/20/20 0344 04/21/20 0548 02/14/21 1454  NA 140 140 139 139  K 4.5 4.1 4.2 4.5  CL 107 109 112* 105  CO2 29 25 21* 26  GLUCOSE 104* 122* 150* 105*  BUN 17 14 16  25*  CREATININE 0.97 0.86 0.90 0.88  CALCIUM 9.1 8.8* 7.7* 8.8*  GFRNONAA 54* >60 59* >60  GFRAA >60 >60 >60  --   PROT  --   --   --  6.7  ALBUMIN  --   --   --  4.2  AST  --   --   --  19  ALT  --   --   --  10  ALKPHOS  --   --   --  100  BILITOT  --   --   --  0.6    RADIOGRAPHIC STUDIES: I have personally reviewed the radiological images as listed and agreed with the  findings in the report. No results found.  ASSESSMENT & PLAN:   Lymphocytosis #     CLL (chronic lymphocytic leukemia) ([place]) #Lymphocytosis ~20-25,000 anemia hemoglobin 9-10; normal platelets.  Clinically highly suspicious for CLL.  The other possible etiologies include-mantle cell lymphoma/other B/T cell lymphoproliferative disorders.    #From a diagnostic perspective I would recommend peripheral blood flow cytometry; CBC CMP LDH; review of peripheral smear.  FISH panel; I GVH mutation panel.  Recommend a PET scan for further evaluation.  Hold off a bone marrow biopsy at this time.   # Given the ongoing fatigue/mild to moderate anemia and clinically concerned the patient is symptomatic from her underlying likely malignancy.  # Fatigue: Likely secondary to underlying anemia/CLL.  Rule out other causes of anemia check iron studies/LDH haptoglobin.  #Discussed with the patient that in general patient CLL cannot be cured; however treatments are very effective in helping treating symptoms.  Await above work-up to decide on treatment options if needed.  Thank you Dr.Hedrick for allowing me to participate in the care of your pleasant patient. Please do not hesitate to contact me with questions or concerns in the interim.  # DISPOSITION: # labs today-  # PET scan ASAP # follow up 1-2 days after the PET scan-Dr.B  All questions were answered. The patient knows to call the clinic with any problems, questions or concerns.    Cammie Sickle, MD 02/14/2021 7:30 PM

## 2021-02-15 LAB — HAPTOGLOBIN: Haptoglobin: 150 mg/dL (ref 41–333)

## 2021-02-16 LAB — COMP PANEL: LEUKEMIA/LYMPHOMA: Immunophenotypic Profile: 81

## 2021-02-18 LAB — FISH HES LEUKEMIA, 4Q12 REA

## 2021-02-21 LAB — IGVH SOMATIC HYPERMUTATION

## 2021-03-02 ENCOUNTER — Ambulatory Visit: Admission: RE | Admit: 2021-03-02 | Payer: Medicare Other | Source: Ambulatory Visit

## 2021-03-03 ENCOUNTER — Ambulatory Visit: Payer: Medicare Other | Admitting: Internal Medicine

## 2021-03-07 ENCOUNTER — Ambulatory Visit: Admission: RE | Admit: 2021-03-07 | Payer: Medicare Other | Source: Ambulatory Visit

## 2021-03-09 ENCOUNTER — Inpatient Hospital Stay: Payer: Medicare Other | Admitting: Internal Medicine

## 2021-03-09 NOTE — Progress Notes (Unsigned)
I connected with Lindsay [name] on 03/09/21 at  3:00 PM EDT by {Blank single:19197::"video enabled telemedicine visit","telephone visit"} and verified that I am speaking with the correct person using two identifiers.  I discussed the limitations, risks, security and privacy concerns of performing an evaluation and management service by telemedicine and the availability of in-person appointments. I also discussed with the patient that there may be a patient responsible charge related to this service. The patient expressed understanding and agreed to proceed.    Other persons participating in the visit and their role in the encounter: RN/medical reconciliation Patient's location: home Provider's location: office  Oncology History Overview Note  # LYMPHOCYTOSIS [25,K]/ANEMIA 9-10]; platelets-N   CLL (chronic lymphocytic leukemia) ([place])  02/14/2021 Initial Diagnosis   CLL (chronic lymphocytic leukemia) ([place])      Chief Complaint: ***    History of present illness:Lindsay [name] 87 y.o.  female with history of   Observation/objective: Alert & oriented x 3. In No acute distress.   Assessment and plan: No problem-specific Assessment & Plan notes found for this encounter.    Follow-up instructions:  I discussed the assessment and treatment plan with the patient.  The patient was provided an opportunity to ask questions and all were answered.  The patient agreed with the plan and demonstrated understanding of instructions.  The patient was advised to call back or seek an in person evaluation if the symptoms worsen or if the condition fails to improve as anticipated.  I provided *** minutes of {Blank single:19197::"face-to-face video visit time","non face-to-face telephone visit time"} during this encounter, and > 50% was spent counseling as documented under my assessment & plan.   Dr. Charlaine Dalton [place] at [facility] 03/09/2021 2:04 PM

## 2021-03-09 NOTE — Assessment & Plan Note (Signed)
#  Lymphocytosis ~20-25,000 anemia hemoglobin 9-10; normal platelets.  Clinically highly suspicious for CLL.  The other possible etiologies include-mantle cell lymphoma/other B/T cell lymphoproliferative disorders.    #From a diagnostic perspective I would recommend peripheral blood flow cytometry; CBC CMP LDH; review of peripheral smear.  FISH panel; I GVH mutation panel.  Recommend a PET scan for further evaluation.  Hold off a bone marrow biopsy at this time.   # Given the ongoing fatigue/mild to moderate anemia and clinically concerned the patient is symptomatic from her underlying likely malignancy.  # Fatigue: Likely secondary to underlying anemia/CLL.  Rule out other causes of anemia check iron studies/LDH haptoglobin.  #Discussed with the patient that in general patient CLL cannot be cured; however treatments are very effective in helping treating symptoms.  Await above work-up to decide on treatment options if needed.  # labs today-  # PET scan ASAP # follow up 1-2 days after the PET scan-Dr.B

## 2021-03-22 ENCOUNTER — Other Ambulatory Visit: Payer: Self-pay

## 2021-03-22 ENCOUNTER — Ambulatory Visit
Admission: RE | Admit: 2021-03-22 | Discharge: 2021-03-22 | Disposition: A | Discharge status: Home / Self Care | Payer: Medicare Other | Source: Ambulatory Visit | Attending: Internal Medicine | Admitting: Internal Medicine

## 2021-03-22 DIAGNOSIS — C9110 Chronic lymphocytic leukemia of B-cell type not having achieved remission: Secondary | ICD-10-CM | POA: Diagnosis not present

## 2021-03-22 DIAGNOSIS — R590 Localized enlarged lymph nodes: Secondary | ICD-10-CM | POA: Diagnosis not present

## 2021-03-22 DIAGNOSIS — R161 Splenomegaly, not elsewhere classified: Secondary | ICD-10-CM | POA: Diagnosis not present

## 2021-03-22 LAB — GLUCOSE, CAPILLARY: Glucose-Capillary: 80 mg/dL (ref 70–99)

## 2021-03-22 MED ORDER — FLUDEOXYGLUCOSE F - 18 (FDG) INJECTION
5.6000 | Freq: Once | INTRAVENOUS | Status: AC | PRN
  Administered 2021-03-22: 5.69 via INTRAVENOUS

## 2021-03-25 ENCOUNTER — Encounter: Payer: Self-pay | Admitting: Internal Medicine

## 2021-03-25 ENCOUNTER — Telehealth: Payer: Self-pay | Admitting: Pharmacy Technician

## 2021-03-25 ENCOUNTER — Inpatient Hospital Stay: Payer: Medicare Other | Attending: Internal Medicine | Admitting: Internal Medicine

## 2021-03-25 ENCOUNTER — Telehealth: Payer: Self-pay | Admitting: Pharmacist

## 2021-03-25 DIAGNOSIS — C9110 Chronic lymphocytic leukemia of B-cell type not having achieved remission: Secondary | ICD-10-CM | POA: Diagnosis not present

## 2021-03-25 MED ORDER — IBRUTINIB 280 MG PO TABS: 280.0000 mg | ORAL_TABLET | Freq: Every day | ORAL | 3 refills | Status: DC

## 2021-03-25 NOTE — Telephone Encounter (Signed)
Will sign patient up to copay grant during virtual visit on 4/12. Patient's daughter know we will need income information to complete the grant application.

## 2021-03-25 NOTE — Progress Notes (Signed)
Spoke with pt and daughter regarding video visit today.  RN verified pt name and date of birth.  Meds reviewed, pt reports generalized arthritic pain.

## 2021-03-25 NOTE — Progress Notes (Signed)
I connected with Lindsay [name] on 03/25/2021 at  3:15 PM EDT by video enabled telemedicine visit and verified that I am speaking with the correct person using two identifiers.  I discussed the limitations, risks, security and privacy concerns of performing an evaluation and management service by telemedicine and the availability of in-person appointments. I also discussed with the patient that there may be a patient responsible charge related to this service. The patient expressed understanding and agreed to proceed.    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 Low level hypermetabolism consistent with CLL.2. Splenomegaly but no focal splenic lesions.       CLL (chronic lymphocytic leukemia) ([place])  02/14/2021 Initial Diagnosis   CLL (chronic lymphocytic leukemia) ([place])   04/07/2021 Cancer Staging   Staging form: Chronic Lymphocytic Leukemia / Small Lymphocytic Lymphoma, AJCC 8th Edition - Clinical: Modified Rai Stage III (Modified Rai risk: High, Binet: Stage C) - Signed by Cammie Sickle, MD on 04/07/2021 Stage prefix: Initial diagnosis    Chief Complaint: CLL   History of present illness:Lindsay [name] 87 y.o.  female recently diagnosed CLL is here to review the results of her PET scan/next treatment plan of care.  Patient continues to complain of fatigue.  Otherwise denies any fevers or chills or night sweats.  Observation/objective: Alert & oriented x 3. In No acute  distress.   Assessment and plan: CLL (chronic lymphocytic leukemia) ([place]) #  CLL. [56% OF NUCLEI POSITIVE FOR A 13Q DELETION]; April 2022-PET scan shows significant lymphadenopathy above and below diaphragm; splenomegaly.  No concerns for any transformation.  #Given the anemia/worsening fatigue/bulky adenopathy patient is a candidate for treatment.  I discussed the treatment options including fixity ration therapy [Gazyva plus venetoclax]vs-oral therapy with ibrutinib-which is indefinite.   #Patient and family are interested with ibrutinib given the logistics/ease of administration etc.  I discussed the potential side effects of ibrutinib including but not limited to elevated blood pressures myalgias/arthralgias risk of cardiac issues especially A. Fib/easy bruising etc. Also discussed regarding worsening white count/lymphocytosis initially on treatment with ibrutinib.   For now we will plan to start at 280 milligrams dose; and if tolerating well will titrate up.  Patient and family understand treatments are palliative not curative.  Patient can get started on ibrutinib when available.  Discussed with Ebony Hail.  # DISPOSITION: # follow up in 1st week of may, 2022; MD;labs- cbc/cmp.Dr.B    Follow-up instructions:  I discussed the assessment and treatment plan with the patient.  The patient was provided an opportunity to ask questions and all were answered.  The patient agreed with the plan and demonstrated understanding of instructions.  The patient was advised to call back or seek an in person evaluation if the symptoms worsen or if the condition fails to improve as anticipated.  Dr. Charlaine Dalton [place] at [facility] 04/07/2021 8:15 AM

## 2021-03-25 NOTE — Telephone Encounter (Signed)
Oral Oncology Pharmacist Encounter  Received new prescription for Imbruvica (ibrutinib) for the treatment of newly diagnosed symptomatic CLL, planned duration until disease progression or unacceptable drug toxicity.  CMP from 02/14/21 assessed, no relevant lab abnormalities. Prescription dose and frequency assessed. MD plans to dose increase as tolerated.   Current medication list in Epic reviewed, no DDIs with ibrutinib identified.   Evaluated chart and no patient barriers to medication adherence identified.   Prescription has been e-scribed to the [facility] for benefits analysis and approval.  Oral Oncology Clinic will continue to follow for insurance authorization, copayment issues, initial counseling and start date.  Scheduled virtual education visit for tomorrow. Patient can get started once she has medication in hand.    Darl Pikes, PharmD, BCPS, BCOP, CPP Hematology/Oncology Clinical Pharmacist Practitioner [facility] [phone]  03/25/2021 3:49 PM

## 2021-03-25 NOTE — Assessment & Plan Note (Addendum)
#    CLL. [56% OF NUCLEI POSITIVE FOR A 13Q DELETION]; April 2022-PET scan shows significant lymphadenopathy above and below diaphragm; splenomegaly.  No concerns for any transformation.  #Given the anemia/worsening fatigue/bulky adenopathy patient is a candidate for treatment.  I discussed the treatment options including fixity ration therapy [Gazyva plus venetoclax]vs-oral therapy with ibrutinib-which is indefinite.   #Patient and family are interested with ibrutinib given the logistics/ease of administration etc.  I discussed the potential side effects of ibrutinib including but not limited to elevated blood pressures myalgias/arthralgias risk of cardiac issues especially A. Fib/easy bruising etc. Also discussed regarding worsening white count/lymphocytosis initially on treatment with ibrutinib.   For now we will plan to start at 280 milligrams dose; and if tolerating well will titrate up.  Patient and family understand treatments are palliative not curative.  Patient can get started on ibrutinib when available.  Discussed with Ebony Hail.  # DISPOSITION: # follow up in 1st week of may, 2022; MD;labs- cbc/cmp.Dr.B

## 2021-03-25 NOTE — Telephone Encounter (Signed)
Oral Oncology Patient Advocate Encounter  Received notification from [employer] that prior authorization for Imbruvica is required.  PA submitted on CoverMyMeds Key [id number] Status is pending  Oral Oncology Clinic will continue to follow.  [place] Patient [place] Phone [phone] Fax [phone] 03/28/2021 9:59 AM

## 2021-03-28 ENCOUNTER — Other Ambulatory Visit (HOSPITAL_COMMUNITY): Payer: Self-pay

## 2021-03-28 MED ORDER — IBRUTINIB 280 MG PO TABS
280.0000 mg | ORAL_TABLET | Freq: Every day | ORAL | 3 refills | Status: DC
  Filled 2021-03-28: qty 28, fill #0
  Filled 2021-03-29 (×3): qty 28, 28d supply, fill #0
  Filled ????-??-??: fill #0

## 2021-03-28 NOTE — Telephone Encounter (Signed)
Oral Oncology Patient Advocate Encounter  Prior Authorization for Kate Sable has been approved.    PA# [id number] Effective dates: 03/28/21 through 12/17/21  Patients co-pay is $3103.91  Oral Oncology Clinic will continue to follow.   [place] Patient [place] Phone [phone] Fax [phone] 03/28/2021 10:02 AM

## 2021-03-29 ENCOUNTER — Telehealth: Payer: Self-pay | Admitting: Pharmacist

## 2021-03-29 ENCOUNTER — Other Ambulatory Visit (HOSPITAL_COMMUNITY): Payer: Self-pay

## 2021-03-29 ENCOUNTER — Inpatient Hospital Stay: Payer: Medicare Other | Admitting: Pharmacist

## 2021-03-29 DIAGNOSIS — M797 Fibromyalgia: Secondary | ICD-10-CM

## 2021-03-29 DIAGNOSIS — C9110 Chronic lymphocytic leukemia of B-cell type not having achieved remission: Secondary | ICD-10-CM

## 2021-03-29 NOTE — Progress Notes (Signed)
[facility]  Telephone:([phone] [phone]  Patient Care Team: [name], MD as PCP - General (Family Medicine)   Name of the patient: Lindsay [name]  [id number]  1934-02-16   Date of visit: 03/29/21  Virtual visit: I connected with Ms. Luddy on 03/29/21 at  2:30 PM EDT by a video enabled telemedicine application and verified that I am speaking with the correct person using two identifiers. I discussed the limitations of evaluation and management by telemedicine and the availability of in person appointments. The patient expressed understanding and agreed to proceed. . Locations: Patient: home (daughter Leana Roe was also present), Provider: in office  HPI: Patient is a 87 y.o. female with newly diagnosed CLL. Planned treatment with Imbruvica (ibrutinib).  Reason for Consult: Imbruvica oral chemotherapy education.   PAST MEDICAL HISTORY: Past Medical History:  Diagnosis Date  . Arthritis    RHEUMATOID  . Edema    MILD ANKLE OCCAS  . Fibromyalgia   . GERD (gastroesophageal reflux disease)   . Hypertension   . Kidney stones   . 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 Low level hypermetabolism consistent with CLL.2. Splenomegaly but no focal splenic lesions.       CLL (chronic lymphocytic leukemia) ([place])  02/14/2021 Initial Diagnosis   CLL (chronic lymphocytic leukemia) ([facility])     ALLERGIES:  is allergic to demerol [meperidine] and septra  [sulfamethoxazole-trimethoprim].  MEDICATIONS:  Current Outpatient Medications  Medication Sig Dispense Refill  . bisacodyl (DULCOLAX) 10 MG suppository Place 1 suppository (10 mg total) rectally daily as needed for moderate constipation. (Patient not taking: Reported on 03/25/2021) 12 suppository 0  . docusate sodium (COLACE) 100 MG capsule Take 1 capsule (100 mg total) by mouth 2 (two) times daily. 10 capsule 0  . feeding supplement, ENSURE ENLIVE, (ENSURE ENLIVE) LIQD Take 237 mLs by mouth 2 (two) times daily between meals. 237 mL 12  . fluticasone (FLONASE) 50 MCG/ACT nasal spray Place into the nose.    Marland Kitchen HYDROcodone-acetaminophen (NORCO/VICODIN) 5-325 MG tablet Take 1-2 tablets by mouth every 4 (four) hours as needed for moderate pain (pain score 4-6). 30 tablet 0  . ibrutinib 280 MG TABS Take 1 tablet (280 mg) by mouth daily. 28 tablet 3  . Multiple Vitamin (MULTIVITAMIN WITH MINERALS) TABS tablet Take 1 tablet by mouth daily. 30 tablet 0   No current facility-administered medications for this visit.    VITAL SIGNS: There were no vitals taken for this visit. There were no vitals filed for this visit.  Estimated body mass index is 19.39 kg/m as calculated from the following:   Height as of 02/14/21: 5\' 2"  (1.575 m).   Weight as of 02/14/21: 48.1 kg (106 lb).  LABS: CBC:    Component Value Date/Time   WBC 27.8 (H) 02/14/2021 1454   HGB 9.4 (L) 02/14/2021 1454   HGB 14.1 03/06/2014 2049   HCT 30.2 (L) 02/14/2021 1454   HCT 42.9 03/06/2014 2049   PLT 177 02/14/2021 1454   PLT 240 03/06/2014 2049   MCV 94.4 02/14/2021 1454   MCV 88 03/06/2014 2049   NEUTROABS  1.8 02/14/2021 1454   NEUTROABS 7.3 (H) 03/06/2014 2049   LYMPHSABS 24.7 (H) 02/14/2021 1454   LYMPHSABS 6.6 (H) 03/06/2014 2049   MONOABS 0.7 02/14/2021 1454   MONOABS 1.3 (H) 03/06/2014 2049   EOSABS 0.6 (H) 02/14/2021 1454   EOSABS 0.4 03/06/2014 2049   BASOSABS 0.1 02/14/2021 1454   BASOSABS 0.1 03/06/2014 2049    Comprehensive Metabolic Panel:    Component Value Date/Time   NA 139 02/14/2021 1454   NA 138 03/06/2014 2049   K 4.5 02/14/2021 1454   K 3.6 03/06/2014 2049   CL 105 02/14/2021 1454   CL 107 03/06/2014 2049   CO2 26 02/14/2021 1454   CO2 29 03/06/2014 2049   BUN 25 (H) 02/14/2021 1454   BUN 14 03/06/2014 2049   CREATININE 0.88 02/14/2021 1454   CREATININE 0.75 03/06/2014 2049   GLUCOSE 105 (H) 02/14/2021 1454   GLUCOSE 103 (H) 03/06/2014 2049   CALCIUM 8.8 (L) 02/14/2021 1454   CALCIUM 8.9 03/06/2014 2049   AST 19 02/14/2021 1454   ALT 10 02/14/2021 1454   ALKPHOS 100 02/14/2021 1454   BILITOT 0.6 02/14/2021 1454   PROT 6.7 02/14/2021 1454   ALBUMIN 4.2 02/14/2021 1454     Assessment and Plan-  Start plan: Ms. Stacey is going out of town for Easter she will not be home to accept Imbruvica medication delivery until after Sunday. Medication will be delivered on Tuesday 04/05/21, she know to get started when she has the medication in hand.  Will contact Dr. Rogue Bussing about f/u appt and labs.   Patient Education I spoke with patient and her daughter Leana Roe for overview of new oral chemotherapy medication: Imbruvica (ibrutinib) for the treatment of newly diagnosed symptomatic CLL, planned duration until disease progression or unacceptable drug toxicity.   Administration: Counseled patient on administration, dosing, side effects, monitoring, drug-food interactions, safe handling, storage, and disposal. Patient will take 1 tablet (280 mg) by mouth daily.  Side Effects: Side effects include but not limited to: decreased wbc/hgb/plt, fatigue, edema, N/V, diarrhea, A.fib. - Patient reported normally having issues with constipation, but lately her bowl movements have been normal. Instructed her to keep an eye on her bowel movements. They plan on picking up some loperamide prior to her getting start on th ibrutinib. - Reviewed patient's current WBC and ALC: discussed the potential  for an increase in these lab numbers with the initiation of ibrutinib but over time they should start to decrease. - Reviewed the low risk of A.fib, she knows to report any heart fluttering she may feel. - Fatigue: she currently feels fatigued, this is likely from her CLL. We would look for this to get better   Adherence: After discussion with patient, no patient barriers to medication adherence identified. She is very Reviewed with patient importance of keeping a medication schedule and plan for any missed doses.  Med Rec: Medication list was updated during today's visit, removed amlodipine and atenolol because Ms. Hams reported no longer taking these. She stopped because she has not had HTN lately, she continues to monitor her BP at home.  Wrap-up: Ms. Ivins and Tressia Miners voiced understanding and appreciation. All questions answered. Medication handout provided.  They wanted to make sure she was okay to be around her grandchild and I confirmed with her that it was okay to be around, hug, and kiss her grandchildren. She should just make sure young child do not have access to her medications.   Provided patient with  [facility] phone number. Patient knows to call the office with questions or concerns. [facility] will continue to follow.  Patient expressed understanding and was in agreement with this plan. She also understands that She can call clinic at any time with any questions, concerns, or complaints.   Medication Access Issues: Signed her up for a PANF grant to cover the copay during today's visit. She will use this to fill her ibrutinib at [facility]. She will be out of town so delivery delayed until Tuesday 04/05/21.  Thank you for allowing me to participate in the care of this patient.   Time Total: 45 mins  Visit consisted of counseling and education on dealing with issues of symptom management in the setting of serious  and potentially life-threatening illness.Greater than 50%  of this time was spent counseling and coordinating care related to the above assessment and plan.  Signed by: Darl Pikes, PharmD, BCPS, Salley Slaughter, CPP Hematology/Oncology Clinical Pharmacist Practitioner [facility] [phone]  03/29/2021 3:15 PM

## 2021-03-29 NOTE — Telephone Encounter (Addendum)
Oral Chemotherapy Pharmacist Encounter  Successfully enrolled patient for copayment assistance funds from CLL from the [place].  Award amount: $9,300 Effective dates: 12/29/20 - 03/28/2022 ID: [id number] BIN: [id number] Group: [id number] PCN: [id number]  Billing information will be shared with [facility]. I will place a copy of the award letter to be scanned into patient's chart.  Darl Pikes, PharmD, BCPS Hematology/Oncology Clinical Pharmacist [facility] [phone]  03/29/2021 5:10 PM

## 2021-04-04 ENCOUNTER — Other Ambulatory Visit (HOSPITAL_COMMUNITY): Payer: Self-pay

## 2021-04-07 ENCOUNTER — Other Ambulatory Visit: Payer: Self-pay | Admitting: *Deleted

## 2021-04-07 DIAGNOSIS — C9110 Chronic lymphocytic leukemia of B-cell type not having achieved remission: Secondary | ICD-10-CM

## 2021-04-08 ENCOUNTER — Emergency Department
Admission: EM | Admit: 2021-04-08 | Discharge: 2021-04-09 | Disposition: A | Discharge status: Home / Self Care | Payer: Medicare Other | Attending: Emergency Medicine | Admitting: Emergency Medicine

## 2021-04-08 ENCOUNTER — Other Ambulatory Visit: Payer: Self-pay

## 2021-04-08 ENCOUNTER — Emergency Department: Payer: Medicare Other

## 2021-04-08 DIAGNOSIS — Z79899 Other long term (current) drug therapy: Secondary | ICD-10-CM | POA: Diagnosis not present

## 2021-04-08 DIAGNOSIS — I158 Other secondary hypertension: Secondary | ICD-10-CM

## 2021-04-08 DIAGNOSIS — H9313 Tinnitus, bilateral: Secondary | ICD-10-CM | POA: Diagnosis not present

## 2021-04-08 DIAGNOSIS — R404 Transient alteration of awareness: Secondary | ICD-10-CM | POA: Diagnosis not present

## 2021-04-08 DIAGNOSIS — R519 Headache, unspecified: Secondary | ICD-10-CM | POA: Insufficient documentation

## 2021-04-08 DIAGNOSIS — Z96642 Presence of left artificial hip joint: Secondary | ICD-10-CM | POA: Insufficient documentation

## 2021-04-08 DIAGNOSIS — I10 Essential (primary) hypertension: Secondary | ICD-10-CM | POA: Insufficient documentation

## 2021-04-08 DIAGNOSIS — Z856 Personal history of leukemia: Secondary | ICD-10-CM | POA: Insufficient documentation

## 2021-04-08 LAB — CBC WITH DIFFERENTIAL/PLATELET
Abs Immature Granulocytes: 0.05 10*3/uL (ref 0.00–0.07)
Basophils Absolute: 0.1 10*3/uL (ref 0.0–0.1)
Basophils Relative: 0 %
Eosinophils Absolute: 0.3 10*3/uL (ref 0.0–0.5)
Eosinophils Relative: 1 %
HCT: 25.4 % — ABNORMAL LOW (ref 36.0–46.0)
Hemoglobin: 7.9 g/dL — ABNORMAL LOW (ref 12.0–15.0)
Immature Granulocytes: 0 %
Lymphocytes Relative: 88 %
Lymphs Abs: 31.4 10*3/uL — ABNORMAL HIGH (ref 0.7–4.0)
MCH: 30.5 pg (ref 26.0–34.0)
MCHC: 31.1 g/dL (ref 30.0–36.0)
MCV: 98.1 fL (ref 80.0–100.0)
Monocytes Absolute: 0.8 10*3/uL (ref 0.1–1.0)
Monocytes Relative: 2 %
Neutro Abs: 3.1 10*3/uL (ref 1.7–7.7)
Neutrophils Relative %: 9 %
Platelets: 166 10*3/uL (ref 150–400)
RBC: 2.59 MIL/uL — ABNORMAL LOW (ref 3.87–5.11)
RDW: 15.5 % (ref 11.5–15.5)
WBC: 35.6 10*3/uL — ABNORMAL HIGH (ref 4.0–10.5)
nRBC: 0.1 % (ref 0.0–0.2)

## 2021-04-08 LAB — COMPREHENSIVE METABOLIC PANEL
ALT: 13 U/L (ref 0–44)
AST: 25 U/L (ref 15–41)
Albumin: 4 g/dL (ref 3.5–5.0)
Alkaline Phosphatase: 91 U/L (ref 38–126)
Anion gap: 7 (ref 5–15)
BUN: 33 mg/dL — ABNORMAL HIGH (ref 8–23)
CO2: 23 mmol/L (ref 22–32)
Calcium: 8.8 mg/dL — ABNORMAL LOW (ref 8.9–10.3)
Chloride: 106 mmol/L (ref 98–111)
Creatinine, Ser: 0.85 mg/dL (ref 0.44–1.00)
GFR, Estimated: >60 mL/min (ref >60)
Glucose, Bld: 102 mg/dL — ABNORMAL HIGH (ref 70–99)
Potassium: 4.3 mmol/L (ref 3.5–5.1)
Sodium: 136 mmol/L (ref 135–145)
Total Bilirubin: 0.9 mg/dL (ref 0.3–1.2)
Total Protein: 6.2 g/dL — ABNORMAL LOW (ref 6.5–8.1)

## 2021-04-08 LAB — URINALYSIS, COMPLETE (UACMP) WITH MICROSCOPIC
Bacteria, UA: NONE SEEN
Bilirubin Urine: NEGATIVE
Glucose, UA: NEGATIVE mg/dL
Hgb urine dipstick: NEGATIVE
Ketones, ur: NEGATIVE mg/dL
Leukocytes,Ua: NEGATIVE
Nitrite: NEGATIVE
Protein, ur: NEGATIVE mg/dL
Specific Gravity, Urine: 1.003 — ABNORMAL LOW (ref 1.005–1.030)
Squamous Epithelial / HPF: NONE SEEN (ref 0–5)
pH: 6 (ref 5.0–8.0)

## 2021-04-08 LAB — TROPONIN I (HIGH SENSITIVITY): Troponin I (High Sensitivity): 12 ng/L (ref <18)

## 2021-04-08 MED ORDER — CLONIDINE HCL 0.1 MG PO TABS
0.1000 mg | ORAL_TABLET | Freq: Once | ORAL | Status: AC
  Administered 2021-04-09: 0.1 mg via ORAL
  Filled 2021-04-08: qty 1

## 2021-04-08 NOTE — ED Notes (Signed)
Patient transported to CT 

## 2021-04-08 NOTE — ED Triage Notes (Signed)
Pt BIB family, family stating that pt has had hypertension and confusion intermittently throughout today. Pt alert and oriented at this time, pt states earlier she couldn't remember where she was or who she was with. Visitor states pt recently started on new medications. Pt endorses ears ringing and dizziness earlier, has resolved. Denies chest pain/shortness of breath. BP 191/129 in triage.

## 2021-04-08 NOTE — ED Provider Notes (Signed)
[facility] Emergency Department Provider Note  ____________________________________________   Event Date/Time   First MD Initiated Contact with Patient 04/08/21 2141     (approximate)  I have reviewed the triage vital signs and the nursing notes.   HISTORY  Chief Complaint Altered Mental Status    HPI Lindsay [name] is a 87 y.o. female with past medical history of hypertension, CLL, recently started on amino modulator agent, here with transient confusion.  The patient states that throughout the day today, she has had 2 episodes in which she began to feel confused, with ringing in her ears, and a mild headache.  The symptoms then resolved over the next 2030 minutes.  She has never had similar symptoms.  She denies any focal numbness weakness or in the episodes.  Of note, approximately 4 days ago she was started on a new amino modulator for her CLL.  This first time she is taking it.  She states she had 1 episode after starting this as well 2 days ago or so.  Denies any current symptoms.  She feels back to baseline.  No chest pain or shortness of breath.  No other recent medication changes.       Past Medical History:  Diagnosis Date  . Arthritis    RHEUMATOID  . Edema    MILD ANKLE OCCAS  . Fibromyalgia   . GERD (gastroesophageal reflux disease)   . Hypertension   . Kidney stones   . RLS (restless legs syndrome)     Patient Active Problem List   Diagnosis Date Noted  . CLL (chronic lymphocytic leukemia) ([place]) 02/14/2021  . HTN (hypertension) 04/19/2020  . Accidental fall 04/19/2020  . Fracture of femoral neck, left, closed ([place]) 04/19/2020  . Preoperative clearance 04/19/2020  . Closed left hip fracture, initial encounter ([place]) 04/19/2020  . Left bundle branch block 04/19/2020  . Leukemia ([place]) 04/19/2020  . Arthritis 03/10/2020  . Calculus of kidney 03/10/2020  . Diverticulosis 03/10/2020  . Fibromyalgia 03/10/2020  . IBS (irritable  bowel syndrome) 03/10/2020    Past Surgical History:  Procedure Laterality Date  . ABDOMINAL HYSTERECTOMY    . CATARACT EXTRACTION W/PHACO Left 02/07/2016   Procedure: CATARACT EXTRACTION PHACO AND INTRAOCULAR LENS PLACEMENT (IOC);  Surgeon: Estill Cotta, MD;  Location: [facility] ORS;  Service: Ophthalmology;  Laterality: Left;  [place] 01:33 AP% 24.7 CDE 39.57 fluid pack lot # [id number]  . EXTRACORPOREAL SHOCK WAVE LITHOTRIPSY Right 07/03/2019   Procedure: EXTRACORPOREAL SHOCK WAVE LITHOTRIPSY (ESWL);  Surgeon: Royston Cowper, MD;  Location: [facility] ORS;  Service: Urology;  Laterality: Right;  . EYE SURGERY    . HIP ARTHROPLASTY Left 04/20/2020   Procedure: ARTHROPLASTY BIPOLAR HIP (HEMIARTHROPLASTY);  Surgeon: Hessie Knows, MD;  Location: [facility] ORS;  Service: Orthopedics;  Laterality: Left;  . LITHOTRIPSY    . TONSILLECTOMY      Prior to Admission medications   Medication Sig Start Date End Date Taking? Authorizing Provider  amitriptyline (ELAVIL) 10 MG tablet Take 20 mg by mouth at bedtime.    [provider]  bisacodyl (DULCOLAX) 10 MG suppository Place 1 suppository (10 mg total) rectally daily as needed for moderate constipation. Patient not taking: Reported on 03/25/2021 04/22/20   Swayze, Ava, DO  docusate sodium (COLACE) 100 MG capsule Take 1 capsule (100 mg total) by mouth 2 (two) times daily. 04/22/20   Swayze, Ava, DO  feeding supplement, ENSURE ENLIVE, (ENSURE ENLIVE) LIQD Take 237 mLs by mouth 2 (two) times  daily between meals. 04/22/20   Swayze, Ava, DO  fluticasone (FLONASE) 50 MCG/ACT nasal spray Place into the nose. 01/14/21 01/14/22  [provider]  HYDROcodone-acetaminophen (NORCO/VICODIN) 5-325 MG tablet Take 1-2 tablets by mouth every 4 (four) hours as needed for moderate pain (pain score 4-6). 04/22/20   Duanne Guess, PA-C  ibrutinib 280 MG TABS Take 1 tablet (280 mg) by mouth daily. 03/28/21   Cammie Sickle, MD  Multiple Vitamin (MULTIVITAMIN WITH  MINERALS) TABS tablet Take 1 tablet by mouth daily. 04/23/20   Swayze, Ava, DO    Allergies Demerol [meperidine] and Septra [sulfamethoxazole-trimethoprim]  Family History  Problem Relation Age of Onset  . Cancer Mother        uterine cancer    Social History Social History   Tobacco Use  . Smoking status: Never Smoker  . Smokeless tobacco: Never Used  Substance Use Topics  . Alcohol use: No  . Drug use: Never    Review of Systems  Review of Systems  Constitutional: Positive for fatigue. Negative for fever.  HENT: Negative for congestion and sore throat.   Eyes: Negative for visual disturbance.  Respiratory: Negative for cough and shortness of breath.   Cardiovascular: Negative for chest pain.  Gastrointestinal: Negative for abdominal pain, diarrhea, nausea and vomiting.  Genitourinary: Negative for flank pain.  Musculoskeletal: Negative for back pain and neck pain.  Skin: Negative for rash and wound.  Neurological: Positive for headaches. Negative for weakness.  Psychiatric/Behavioral: Positive for confusion.  All other systems reviewed and are negative.    ____________________________________________  PHYSICAL EXAM:      VITAL SIGNS: ED Triage Vitals  Enc Vitals Group     BP 04/08/21 2110 (!) 191/129     Pulse Rate 04/08/21 2110 70     Resp 04/08/21 2110 16     Temp 04/08/21 2110 98.2 F (36.8 C)     Temp Source 04/08/21 2110 Oral     SpO2 04/08/21 2110 100 %     Weight 04/08/21 2111 106 lb (48.1 kg)     Height 04/08/21 2111 5\' 2"  (1.575 m)     Head Circumference --      Peak Flow --      Pain Score 04/08/21 2111 0     Pain Loc --      Pain Edu? --      Excl. in [place]? --      Physical Exam Vitals and nursing note reviewed.  Constitutional:      General: She is not in acute distress.    Appearance: She is well-developed.  HENT:     Head: Normocephalic and atraumatic.  Eyes:     Conjunctiva/sclera: Conjunctivae normal.  Cardiovascular:     Rate  and Rhythm: Normal rate and regular rhythm.     Heart sounds: Normal heart sounds. No murmur heard. No friction rub.  Pulmonary:     Effort: Pulmonary effort is normal. No respiratory distress.     Breath sounds: Normal breath sounds. No wheezing or rales.  Abdominal:     General: There is no distension.     Palpations: Abdomen is soft.     Tenderness: There is no abdominal tenderness.  Musculoskeletal:     Cervical back: Neck supple.  Skin:    General: Skin is warm.     Capillary Refill: Capillary refill takes less than 2 seconds.  Neurological:     Mental Status: She is alert.     GCS:  GCS eye subscore is 4. GCS verbal subscore is 5. GCS motor subscore is 6.     Cranial Nerves: Cranial nerves are intact.     Sensory: Sensation is intact.     Motor: No abnormal muscle tone.     Coordination: Coordination is intact.     Gait: Gait is intact.       ____________________________________________   LABS (all labs ordered are listed, but only abnormal results are displayed)  Labs Reviewed  CBC WITH DIFFERENTIAL/PLATELET - Abnormal; Notable for the following components:      Result Value   WBC 35.6 (*)    RBC 2.59 (*)    Hemoglobin 7.9 (*)    HCT 25.4 (*)    Lymphs Abs 31.4 (*)    All other components within normal limits  COMPREHENSIVE METABOLIC PANEL - Abnormal; Notable for the following components:   Glucose, Bld 102 (*)    BUN 33 (*)    Calcium 8.8 (*)    Total Protein 6.2 (*)    All other components within normal limits  URINALYSIS, COMPLETE (UACMP) WITH MICROSCOPIC - Abnormal; Notable for the following components:   Color, Urine COLORLESS (*)    APPearance CLEAR (*)    Specific Gravity, Urine 1.003 (*)    All other components within normal limits  PATHOLOGIST SMEAR REVIEW  TROPONIN I (HIGH SENSITIVITY)  TROPONIN I (HIGH SENSITIVITY)    ____________________________________________  EKG: Normal sinus rhythm, ventricular rate 69.  PR 173, QRS 144, QTc 520.  No  acute ST elevations.  Left bundle branch block. ________________________________________  RADIOLOGY All imaging, including plain films, CT scans, and ultrasounds, independently reviewed by me, and interpretations confirmed via formal radiology reads.  ED MD interpretation:   CT head: Negative  Official radiology report(s): CT Head Wo Contrast  Result Date: 04/08/2021 CLINICAL DATA:  Altered mental status EXAM: CT HEAD WITHOUT CONTRAST TECHNIQUE: Contiguous axial images were obtained from the base of the skull through the vertex without intravenous contrast. COMPARISON:  None. FINDINGS: Brain: No evidence of acute large vascular territory infarction, hemorrhage, hydrocephalus, extra-axial collection or mass lesion/mass effect. Mild age appropriate global parenchymal volume loss. Vascular: No hyperdense vessel. Scattered atherosclerotic calcifications of the internal carotid and vertebral arteries at the skull base. Skull: Normal. Negative for fracture or focal lesion. Sinuses/Orbits: Paranasal sinuses are predominantly clear. Prior lens surgery. Other: None IMPRESSION: 1. No acute intracranial findings. Electronically Signed   By: Dahlia Bailiff MD   On: 04/08/2021 21:46    ____________________________________________  PROCEDURES   Procedure(s) performed (including Critical Care):  Procedures  ____________________________________________  INITIAL IMPRESSION / MDM / [place] / ED COURSE  As part of my medical decision making, I reviewed the following data within the [place] notes reviewed and incorporated, Old chart reviewed, Notes from prior ED visits, and [place] Controlled Substance [place] was evaluated in Emergency Department on 04/09/2021 for the symptoms described in the history of present illness. She was evaluated in the context of the global COVID-19 pandemic, which necessitated consideration that the patient might be  at risk for infection with the SARS-CoV-2 virus that causes COVID-19. Institutional protocols and algorithms that pertain to the evaluation of patients at risk for COVID-19 are in a state of rapid change based on information released by regulatory bodies including the CDC and federal and state organizations. These policies and algorithms were followed during the patient's care in the  ED.  Some ED evaluations and interventions may be delayed as a result of limited staffing during the pandemic.*     Medical Decision Making: 87 year old female here with transient episode of mild ear ringing, headache, and confusion.  Patient markedly hypertensive here.  With resolution of this, she returned to her baseline.  No other ongoing focal neurological deficits.  Patient recently started on Imbruvica, which has known side effect of hypertension.  I suspect this is contributing to mild symptomatic hypertension.  As mentioned, no focal deficits.  CT head reviewed and shows no evidence of bleed or stroke.  She is back to her baseline.  Screening lab work is overall unremarkable.  EKG nonischemic.  No chest pain.  Given the resolution of her symptoms, and the correlation with taking her medication, we discussed management options.  Given that she has had some mild hypotension in the past, will have her hold the new medication, as opposed to starting an antihypertensive, and have her notify her oncologist tomorrow.  Given that this was just recently started for CLL, feel this is safe and reasonable.  Otherwise, will discharge home.  ____________________________________________  FINAL CLINICAL IMPRESSION(S) / ED DIAGNOSES  Final diagnoses:  Transient alteration of awareness  Other secondary hypertension     MEDICATIONS GIVEN DURING THIS VISIT:  Medications  cloNIDine (CATAPRES) tablet 0.1 mg (0.1 mg Oral Given 04/09/21 0001)     ED Discharge Orders    None       Note:  This document was prepared using  Dragon voice recognition software and may include unintentional dictation errors.   Duffy Bruce, MD 04/09/21 0005

## 2021-04-09 LAB — TROPONIN I (HIGH SENSITIVITY): Troponin I (High Sensitivity): 14 ng/L (ref <18)

## 2021-04-09 NOTE — Discharge Instructions (Addendum)
STOP the new medication that was prescribed by your Oncologist  Keep track of your BP at home for the next 2 days  Call on Monday to notify Dr. B and discuss medication changes  Avoid caffeine

## 2021-04-11 ENCOUNTER — Telehealth: Payer: Self-pay | Admitting: Internal Medicine

## 2021-04-11 ENCOUNTER — Telehealth: Payer: Self-pay | Admitting: Pharmacist

## 2021-04-11 LAB — PATHOLOGIST SMEAR REVIEW

## 2021-04-11 NOTE — Telephone Encounter (Signed)
On 4/25-spoke to patient daughter Olivia Mackie regarding the recent visit to the ER with elevated blood pressure/mental status changes after 3 days of ibrutinib.  Currently off ibrutinib.  Recommend close monitoring of blood pressures; awaiting appointment with PCP on 4/25.  Recommend take log of blood pressures to PCP's office.  Also recommend bring a copy of blood pressure log to our appointment.  Discussed with Ebony Hail regarding possibility of looking into acalabrutinib [less chance of hypertension/cardiac causes].  GB

## 2021-04-11 NOTE — Telephone Encounter (Signed)
Oral Chemotherapy Pharmacist Encounter   Patient's daughter Lindsay [name] called me to let me know her mom recently went to the ER for high blood pressure. Lindsay [name] said her mother was not acting like her self, so they decided to take her to the ER. BP reading int the ER 191/129. [name] took 3 tablets of ibrutinib prior to the trip to the ER. He has been holding the ibrutinib since the trip to the ER. Since the stopping the ibrutinib and going the ER [name] has felt better. Tracie did mention that [name] felt like her lymph nodes were smaller since starting the ibrutinib.   Lindsay [name] is in the process of setting up a f/u for her mother with her primary care.  [name] has a hx of HTN. During my education virtual visit on 03/29/21, [name] reported no longer taking her amlodipine and atenolol . She stopped because she has not had HTN lately. She also reported monitoring her BP at home. I asked Tracie about [name]'s BP home number prior to her ER visit and she said her mom thought her BP home readings were good so she stopped checking it. [name] is now checking it twice daily and writing down the readings.  HTN incidence: ibrutinib 11-42% and acalabrutinib 3.2-5%  Spoke with Dr. Rogue Bussing and let him know all of the above information. He plans on giving Tracie and Ms. Simao a call this afternoon. We will work in [employer] for [employer] a therapy switch is needed.  I called Tracie back to let her know to expect a call from Dr. Rogue Bussing this afternoon.  Darl Pikes, PharmD, BCPS, BCOP, CPP Hematology/Oncology Clinical Pharmacist [facility] [phone]  04/11/2021 9:39 AM

## 2021-04-11 NOTE — Telephone Encounter (Signed)
Oral Oncology Pharmacist Encounter   Patient may potentially switch to Calquence due to hypertension with Ibrutinib. PA is being submitted to expedite that process, if needed.  Received notification from [employer] that prior authorization for Calquence is required.   PA submitted on CMM Key [id number]  Status is pending   Oral Oncology Clinic will continue to follow.   Darl Pikes, PharmD, BCPS, [facility] Hematology/Oncology Clinical Pharmacist [facility] [phone]  04/11/2021 2:14 PM

## 2021-04-11 NOTE — Telephone Encounter (Signed)
Oral Oncology Pharmacist Encounter   Prior Authorization for Calquence has been approved.     PA# [id number] Effective dates: 04/11/21 through 12/17/21   Oral Oncology Clinic will continue to follow.   Darl Pikes, PharmD, BCPS. BCOP Hematology/Oncology Clinical Pharmacist [facility] Oral Chemotherapy [facility] [phone]  04/11/2021 2:28 PM

## 2021-04-14 ENCOUNTER — Telehealth: Payer: Self-pay | Admitting: Internal Medicine

## 2021-04-14 DIAGNOSIS — C9110 Chronic lymphocytic leukemia of B-cell type not having achieved remission: Secondary | ICD-10-CM

## 2021-04-14 NOTE — Telephone Encounter (Signed)
Pts daughter called to cancel the appt for 5/2 said she is having BP issues and wants to get that straight before they come and she doesn't want to stop taking the chemo pill. Didn't know if someone might want to call pt to explain if BP is high due to a side effect of chemo pill?

## 2021-04-14 NOTE — Telephone Encounter (Signed)
On 4/28-spoke to patient's daughter regarding issues with ongoing elevated blood pressure.  Recommend follow-up with PCP/monitoring blood pressure closely.  Patient is currently off ibrutinib.  Recommend staying off ibrutinib at this time.  Discussed multiple other options available for treatment of CLL.  Recommend follow up on may 6th-MD; labs- cbc/bmp  GB

## 2021-04-14 NOTE — Telephone Encounter (Signed)
Dr. B - please advise. 

## 2021-04-15 NOTE — Addendum Note (Signed)
Addended by: Gloris Ham on: 04/15/2021 09:40 AM   Modules accepted: Orders

## 2021-04-18 ENCOUNTER — Inpatient Hospital Stay: Payer: Medicare Other

## 2021-04-18 ENCOUNTER — Inpatient Hospital Stay: Payer: Medicare Other | Admitting: Pharmacist

## 2021-04-18 ENCOUNTER — Inpatient Hospital Stay: Payer: Medicare Other | Admitting: Internal Medicine

## 2021-04-22 ENCOUNTER — Telehealth: Payer: Self-pay | Admitting: *Deleted

## 2021-04-22 ENCOUNTER — Inpatient Hospital Stay: Payer: Medicare Other

## 2021-04-22 ENCOUNTER — Inpatient Hospital Stay: Payer: Medicare Other | Admitting: Internal Medicine

## 2021-04-22 NOTE — Telephone Encounter (Signed)
Daughter sent mychart message to change apt date/time. I attempted to reach the patient's daughter. I left a detailed vm with my phone # to have daughter return my phone call.

## 2021-04-22 NOTE — Telephone Encounter (Signed)
Spoke with daughter. Pt has fallen and hurt her back. Daughter wanted to r/s the apt to the end of the month of May. Pt's pcp will monitor blood work on 5/19. Apt made on 5/24 with Dr. Rogue Bussing

## 2021-04-25 ENCOUNTER — Ambulatory Visit: Payer: Medicare Other | Admitting: Podiatry

## 2021-04-26 ENCOUNTER — Other Ambulatory Visit (HOSPITAL_COMMUNITY): Payer: Self-pay

## 2021-05-10 ENCOUNTER — Inpatient Hospital Stay: Payer: Medicare Other | Admitting: Pharmacist

## 2021-05-10 ENCOUNTER — Encounter: Payer: Self-pay | Admitting: Internal Medicine

## 2021-05-10 ENCOUNTER — Inpatient Hospital Stay: Payer: Medicare Other | Attending: Internal Medicine | Admitting: Internal Medicine

## 2021-05-10 DIAGNOSIS — C9110 Chronic lymphocytic leukemia of B-cell type not having achieved remission: Secondary | ICD-10-CM | POA: Insufficient documentation

## 2021-05-10 DIAGNOSIS — I161 Hypertensive emergency: Secondary | ICD-10-CM | POA: Diagnosis not present

## 2021-05-10 DIAGNOSIS — R030 Elevated blood-pressure reading, without diagnosis of hypertension: Secondary | ICD-10-CM | POA: Insufficient documentation

## 2021-05-10 DIAGNOSIS — Z90710 Acquired absence of both cervix and uterus: Secondary | ICD-10-CM | POA: Diagnosis not present

## 2021-05-10 MED ORDER — CALQUENCE 100 MG PO CAPS
100.0000 mg | ORAL_CAPSULE | Freq: Every day | ORAL | 0 refills | Status: DC
Start: 2021-05-10 — End: 2021-06-23
  Filled 2021-05-10 – 2021-05-11 (×2): qty 30, 30d supply, fill #0

## 2021-05-10 NOTE — Progress Notes (Signed)
[facility]  [phone] [phone]  Patient Care Team: [name], MD as PCP - General (Family Medicine)   Name of the patient: Lindsay [name]  [id number]  04-18-1934   Date of visit: 05/10/21  HPI: Patient is a 87 y.o. female with newly diagnosed CLL. Planned treatment with Calquence (acalabrutinib). She is switching from ibrutinib due to issues with HTN.   Reason for Consult: Calquence (acalabrutinib) oral chemotherapy education.   PAST MEDICAL HISTORY: Past Medical History:  Diagnosis Date  . Arthritis    RHEUMATOID  . Edema    MILD ANKLE OCCAS  . Fibromyalgia   . GERD (gastroesophageal reflux disease)   . Hypertension   . Kidney stones   . 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 Low level hypermetabolism consistent with CLL.2. Splenomegaly but no focal splenic lesions.  # April 2022- 13 Q DEL; IGVH- UNMUTATED  # MID April 2022- IBRUTINIB 280 mg x3 days [STOPPED sec ER/hypertensive emergency- 191/129]  # MAY 26th, 2022- start acalabrutinib 100 mg once a day [reduced dose]       CLL (chronic lymphocytic leukemia) ([place])  02/14/2021 Initial Diagnosis   CLL (chronic lymphocytic leukemia) ([place])   04/07/2021 Cancer Staging   Staging form: Chronic Lymphocytic Leukemia / Small Lymphocytic Lymphoma, AJCC 8th Edition - Clinical: Modified Rai Stage III (Modified Rai risk: High, Binet: Stage C) - Signed by Cammie Sickle, MD on 04/07/2021 Stage prefix: Initial diagnosis      ALLERGIES:  is allergic to demerol [meperidine] and septra [sulfamethoxazole-trimethoprim].  MEDICATIONS:  Current Outpatient Medications  Medication Sig Dispense Refill  . amitriptyline (ELAVIL) 10 MG tablet Take 20 mg by mouth at bedtime.    Marland Kitchen amLODipine (NORVASC) 5 MG tablet Take by mouth.    . bisacodyl (DULCOLAX) 10 MG suppository Place 1 suppository (10 mg total) rectally daily as needed for moderate constipation. (Patient not taking: No sig reported) 12 suppository 0  . docusate sodium (COLACE) 100 MG capsule Take 1 capsule (100 mg total) by mouth 2 (two) times daily. (Patient not taking: Reported on 05/10/2021) 10 capsule 0  . feeding supplement, ENSURE ENLIVE, (ENSURE ENLIVE) LIQD Take 237 mLs by mouth 2 (two) times daily between meals. 237 mL 12  . fluticasone (FLONASE) 50 MCG/ACT nasal spray Place into the nose. (Patient not taking: Reported on 05/10/2021)    . HYDROcodone-acetaminophen (NORCO/VICODIN) 5-325 MG tablet Take 1-2 tablets by mouth every 4 (four) hours as needed for moderate pain (pain score 4-6). 30 tablet 0  . Multiple Vitamin (MULTIVITAMIN WITH MINERALS) TABS tablet Take 1 tablet by mouth daily. 30 tablet 0   No current facility-administered medications for this visit.    VITAL SIGNS: There were no vitals taken for this visit. There were no vitals filed for this visit.  Estimated body mass index is 20.85 kg/m as calculated from the following:   Height as of an earlier encounter on 05/10/21: 5\' 2"  (1.575 m).   Weight as of an earlier encounter on 05/10/21: 51.7 kg (114 lb).  LABS: CBC:    Component Value Date/Time   WBC 35.6 (H) 04/08/2021 2159  HGB 7.9 (L) 04/08/2021 2159   HGB 14.1 03/06/2014 2049   HCT 25.4 (L) 04/08/2021 2159   HCT 42.9 03/06/2014 2049   PLT 166 04/08/2021 2159   PLT 240 03/06/2014 2049   MCV 98.1 04/08/2021 2159   MCV 88 03/06/2014 2049   NEUTROABS 3.1 04/08/2021 2159   NEUTROABS 7.3 (H) 03/06/2014 2049   LYMPHSABS 31.4 (H)  04/08/2021 2159   LYMPHSABS 6.6 (H) 03/06/2014 2049   MONOABS 0.8 04/08/2021 2159   MONOABS 1.3 (H) 03/06/2014 2049   EOSABS 0.3 04/08/2021 2159   EOSABS 0.4 03/06/2014 2049   BASOSABS 0.1 04/08/2021 2159   BASOSABS 0.1 03/06/2014 2049   Comprehensive Metabolic Panel:    Component Value Date/Time   NA 136 04/08/2021 2159   NA 138 03/06/2014 2049   K 4.3 04/08/2021 2159   K 3.6 03/06/2014 2049   CL 106 04/08/2021 2159   CL 107 03/06/2014 2049   CO2 23 04/08/2021 2159   CO2 29 03/06/2014 2049   BUN 33 (H) 04/08/2021 2159   BUN 14 03/06/2014 2049   CREATININE 0.85 04/08/2021 2159   CREATININE 0.75 03/06/2014 2049   GLUCOSE 102 (H) 04/08/2021 2159   GLUCOSE 103 (H) 03/06/2014 2049   CALCIUM 8.8 (L) 04/08/2021 2159   CALCIUM 8.9 03/06/2014 2049   AST 25 04/08/2021 2159   ALT 13 04/08/2021 2159   ALKPHOS 91 04/08/2021 2159   BILITOT 0.9 04/08/2021 2159   PROT 6.2 (L) 04/08/2021 2159   ALBUMIN 4.0 04/08/2021 2159     Present during today's visit: Ms. Haser and her daughter Leana Roe  Assessment and Plan: Start plan: She will start acalabrutinib at the reduced dose of 100mg  daily. She will increase to 100mg  twice daily if tolerated.  She will get started when she has medication in hand.  Prescription Assessment: Prescription dose and frequency assessed.   Current medication list in Epic reviewed, a few DDIs with acalabrutinib identified: - Ms. Hoheisel should separate her multivitamin and ensure by at least 2 hours.  Patient Education I spoke with patient for overview of new oral chemotherapy medication: Calquence (acalabrutinib) for the treatment of CLL, planned duration until disease progression or unacceptable drug toxicity.   Administration: Counseled patient on administration, dosing, side effects, monitoring, drug-food interactions, safe handling, storage, and disposal. Patient will take 1 capsule (100 mg total) by mouth daily.  Side Effects: Side effects include  but not limited to: headache, diarrhea, and decrease in hgb/plt/wbc.    Headache: acalabrutinib headaches respond well to caffeine. Instructed patient to try a cup of coffee or caffeinated soda in the event of a headache. She should also check her blood pressure to make sure that is not the cause of the headache.  Adherence: After discussion with patient no patient barriers to medication adherence identified.  Reviewed with patient importance of keeping a medication schedule and plan for any missed doses.  Ms. Waddle and Leana Roe voiced understanding and appreciation. All questions answered. Medication handout provided.  Provided patient with [facility] phone number. Patient knows to call the office with questions or concerns. Oral Chemotherapy Navigation Clinic will continue to follow.  Patient expressed understanding and was in agreement with this plan. She also understands that She can call clinic at any time with any questions, concerns, or complaints.   Medication Access Issues: She will continue to fill at [facility] using her [place] for copay coverage.  Thank you for allowing me to participate in the care of  this patient.   Time Total: 20 mins  Visit consisted of counseling and education on dealing with issues of symptom management in the setting of serious and potentially life-threatening illness.Greater than 50%  of this time was spent counseling and coordinating care related to the above assessment and plan.  Signed by: Darl Pikes, PharmD, BCPS, Salley Slaughter, CPP Hematology/Oncology Clinical Pharmacist Practitioner [facility] [phone]  05/10/2021 4:41 PM

## 2021-05-10 NOTE — Progress Notes (Signed)
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 Low level hypermetabolism consistent with CLL.2. Splenomegaly but no focal splenic lesions.  # April 2022- 13 Q DEL; IGVH- UNMUTATED  # MID April 2022- IBRUTINIB 280 mg x3 days [STOPPED sec ER/hypertensive emergency- 191/129]  # MAY 26th, 2022- start acalabrutinib 100 mg once a day [reduced dose]       CLL (chronic lymphocytic leukemia) ([place])  02/14/2021 Initial Diagnosis   CLL (chronic lymphocytic leukemia) ([place])   04/07/2021 Cancer Staging   Staging form: Chronic Lymphocytic Leukemia / Small Lymphocytic Lymphoma, AJCC 8th Edition - Clinical: Modified Rai Stage III (Modified Rai risk: High, Binet: Stage C) - Signed by Cammie Sickle, MD on 04/07/2021 Stage prefix: Initial diagnosis      HISTORY OF PRESENTING ILLNESS:  Lindsay [name] 87 y.o.  female symptomatic CLL is here for follow-up.  Patient started on ibrutinib in mid April 2022.  Just being on medication for 3 days-patient noted to have significant improvement of her lymphadenopathy in the neck; however she felt improved.  However patient had mental status changes/confusion-evaluated in the emergency room noted to have a blood pressure of 191/129.  CT scan brain negative.  Patient's blood pressures improved after antihypertensive medication.  Patient states she has been checking her blood pressure at home on regular basis-systolic 163W/[id number] 01X.  On  amlodipine 5 mg a day.  Review of Systems  Constitutional: Positive for malaise/fatigue. Negative for chills, diaphoresis, fever and weight loss.  HENT: Negative for nosebleeds and sore throat.   Eyes: Negative for double vision.  Respiratory: Negative for cough, hemoptysis, sputum production, shortness of breath and wheezing.   Cardiovascular: Negative for chest pain, palpitations, orthopnea and leg swelling.  Gastrointestinal: Negative for abdominal pain, blood in stool, constipation, diarrhea, heartburn, melena, nausea and vomiting.  Genitourinary: Negative for dysuria, frequency and urgency.  Musculoskeletal: Negative for back pain and joint pain.  Skin: Negative.  Negative for itching and rash.  Neurological: Negative for dizziness, tingling, focal weakness, weakness and headaches.  Endo/Heme/Allergies: Does not bruise/bleed easily.  Psychiatric/Behavioral: Negative for depression. The patient is not nervous/anxious and does not have insomnia.      MEDICAL HISTORY:  Past Medical History:  Diagnosis Date  . Arthritis    RHEUMATOID  . Edema    MILD ANKLE OCCAS  . Fibromyalgia   . GERD (gastroesophageal reflux disease)   . Hypertension   . Kidney stones   . RLS (restless legs syndrome)     SURGICAL HISTORY: Past Surgical History:  Procedure Laterality Date  . ABDOMINAL HYSTERECTOMY    . CATARACT EXTRACTION W/PHACO Left 02/07/2016   Procedure: CATARACT EXTRACTION PHACO AND INTRAOCULAR LENS PLACEMENT (IOC);  Surgeon: Estill Cotta, MD;  Location: [facility] ORS;  Service: Ophthalmology;  Laterality: Left;  [place] 01:33 AP% 24.7 CDE 39.57 fluid pack lot # [id number]  . EXTRACORPOREAL SHOCK WAVE LITHOTRIPSY Right 07/03/2019   Procedure: EXTRACORPOREAL SHOCK WAVE LITHOTRIPSY (ESWL);  Surgeon: Royston Cowper, MD;  Location: [facility] ORS;  Service: Urology;  Laterality: Right;  . EYE SURGERY    .  HIP ARTHROPLASTY Left 04/20/2020   Procedure: ARTHROPLASTY BIPOLAR HIP (HEMIARTHROPLASTY);   Surgeon: Hessie Knows, MD;  Location: [facility] ORS;  Service: Orthopedics;  Laterality: Left;  . LITHOTRIPSY    . TONSILLECTOMY      SOCIAL HISTORY: Social History   Socioeconomic History  . Marital status: Married    Spouse name: Not on file  . Number of children: Not on file  . Years of education: Not on file  . Highest education level: Not on file  Occupational History  . Not on file  Tobacco Use  . Smoking status: Never Smoker  . Smokeless tobacco: Never Used  Substance and Sexual Activity  . Alcohol use: No  . Drug use: Never  . Sexual activity: Not on file  Other Topics Concern  . Not on file  Social History Narrative   Lives with daughter, in [place]. Remote smoking; no alcohol. 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    Social Determinants of Health   Financial Resource Strain: Not on file  Food Insecurity: Not on file  Transportation Needs: Not on file  Physical Activity: Not on file  Stress: Not on file  Social Connections: Not on file  Intimate Partner Violence: Not on file    FAMILY HISTORY: Family History  Problem Relation Age of Onset  . Cancer Mother        uterine cancer    ALLERGIES:  is allergic to demerol [meperidine] and septra [sulfamethoxazole-trimethoprim].  MEDICATIONS:  Current Outpatient Medications  Medication Sig Dispense Refill  . amitriptyline (ELAVIL) 10 MG tablet Take 20 mg by mouth at bedtime.    Marland Kitchen amLODipine (NORVASC) 5 MG tablet Take by mouth.    . feeding supplement, ENSURE ENLIVE, (ENSURE ENLIVE) LIQD Take 237 mLs by mouth 2 (two) times daily between meals. 237 mL 12  . HYDROcodone-acetaminophen (NORCO/VICODIN) 5-325 MG tablet Take 1-2 tablets by mouth every 4 (four) hours as needed for moderate pain (pain score 4-6). 30 tablet 0  . Multiple Vitamin (MULTIVITAMIN WITH MINERALS) TABS tablet Take 1 tablet by mouth daily. 30 tablet 0  . bisacodyl (DULCOLAX) 10 MG suppository Place 1 suppository (10 mg total) rectally daily as needed for moderate  constipation. (Patient not taking: No sig reported) 12 suppository 0  . docusate sodium (COLACE) 100 MG capsule Take 1 capsule (100 mg total) by mouth 2 (two) times daily. (Patient not taking: Reported on 05/10/2021) 10 capsule 0  . fluticasone (FLONASE) 50 MCG/ACT nasal spray Place into the nose. (Patient not taking: Reported on 05/10/2021)     No current facility-administered medications for this visit.      Marland Kitchen  PHYSICAL EXAMINATION: ECOG PERFORMANCE STATUS: 0 - Asymptomatic  Vitals:   05/10/21 1533  BP: (!) 158/42  Pulse: 73  Resp: 16  Temp: 98.1 F (36.7 C)  SpO2: 100%   Filed Weights   05/10/21 1533  Weight: 114 lb (51.7 kg)    Physical Exam HENT:     Head: Normocephalic and atraumatic.     Mouth/Throat:     Pharynx: No oropharyngeal exudate.  Eyes:     Pupils: Pupils are equal, round, and reactive to light.  Cardiovascular:     Rate and Rhythm: Normal rate and regular rhythm.  Pulmonary:     Effort: No respiratory distress.     Breath sounds: No wheezing.  Abdominal:     General: Bowel sounds are normal. There is no distension.     Palpations: Abdomen is soft. There is no mass.  Tenderness: There is no abdominal tenderness. There is no guarding or rebound.  Musculoskeletal:        General: No tenderness. Normal range of motion.     Cervical back: Normal range of motion and neck supple.  Skin:    General: Skin is warm.  Neurological:     Mental Status: She is alert and oriented to person, place, and time.  Psychiatric:        Mood and Affect: Affect normal.      LABORATORY DATA:  I have reviewed the data as listed Lab Results  Component Value Date   WBC 35.6 (H) 04/08/2021   HGB 7.9 (L) 04/08/2021   HCT 25.4 (L) 04/08/2021   MCV 98.1 04/08/2021   PLT 166 04/08/2021   Recent Labs    02/14/21 1454 04/08/21 2159  NA 139 136  K 4.5 4.3  CL 105 106  CO2 26 23  GLUCOSE 105* 102*  BUN 25* 33*  CREATININE 0.88 0.85  CALCIUM 8.8* 8.8*   GFRNONAA >60 >60  PROT 6.7 6.2*  ALBUMIN 4.2 4.0  AST 19 25  ALT 10 13  ALKPHOS 100 91  BILITOT 0.6 0.9    RADIOGRAPHIC STUDIES: I have personally reviewed the radiological images as listed and agreed with the findings in the report. No results found.  ASSESSMENT & PLAN:   CLL (chronic lymphocytic leukemia) ([place]) #  CLL. [56% OF NUCLEI POSITIVE FOR A 13Q DELETION]; April 2022-PET scan shows significant lymphadenopathy above and below diaphragm; splenomegaly.  No concerns for any transformation.  # Due to poor tolerance to ibrutinib; ibrutinib has been discontinued. I would recommend starting acalabrutinib 100 mg once a day; discussed the patient is also potential risk of similar side effects however cardiac/vascular events less common.  Discussed the possibility of headaches; and use of caffeine to help treat the headaches.  #Elevated blood pressure/hypertensive emergency-likely due to ibrutinib; see discontinue as above.  Again recommend monitoring blood pressure closely daily.  Discussed with Ebony Hail.  # DISPOSITION: # follow up in 2 week of June  MD;labs- cbc/cmp.Dr.B    All questions were answered. The patient knows to call the clinic with any problems, questions or concerns.    Cammie Sickle, MD 05/10/2021 4:21 PM

## 2021-05-10 NOTE — Assessment & Plan Note (Addendum)
#    CLL. [56% OF NUCLEI POSITIVE FOR A 13Q DELETION]; April 2022-PET scan shows significant lymphadenopathy above and below diaphragm; splenomegaly.  No concerns for any transformation.  # Due to poor tolerance to ibrutinib; ibrutinib has been discontinued. I would recommend starting acalabrutinib 100 mg once a day; discussed the patient is also potential risk of similar side effects however cardiac/vascular events less common.  Discussed the possibility of headaches; and use of caffeine to help treat the headaches.  #Elevated blood pressure/hypertensive emergency-likely due to ibrutinib; see discontinue as above.  Again recommend monitoring blood pressure closely daily.  Discussed with Ebony Hail.  # DISPOSITION: # follow up in 2 week of June  MD;labs- cbc/cmp.Dr.B

## 2021-05-11 ENCOUNTER — Other Ambulatory Visit (HOSPITAL_COMMUNITY): Payer: Self-pay

## 2021-05-12 ENCOUNTER — Ambulatory Visit: Payer: Medicare Other | Admitting: Pharmacist

## 2021-05-17 ENCOUNTER — Other Ambulatory Visit (HOSPITAL_COMMUNITY): Payer: Self-pay

## 2021-05-23 ENCOUNTER — Ambulatory Visit: Payer: Medicare Other | Admitting: Podiatry

## 2021-05-23 ENCOUNTER — Other Ambulatory Visit: Payer: Self-pay

## 2021-05-23 ENCOUNTER — Encounter: Payer: Self-pay | Admitting: Podiatry

## 2021-05-23 DIAGNOSIS — M79676 Pain in unspecified toe(s): Secondary | ICD-10-CM

## 2021-05-23 DIAGNOSIS — D2371 Other benign neoplasm of skin of right lower limb, including hip: Secondary | ICD-10-CM | POA: Diagnosis not present

## 2021-05-23 DIAGNOSIS — B351 Tinea unguium: Secondary | ICD-10-CM

## 2021-05-24 NOTE — Progress Notes (Signed)
She presents today states that her hallux nail is thick and is painful and she is needs to have it trimmed and ground down.  States that the fourth toe over the lateral aspect is painful.  Objective: Vital signs are stable alert oriented x3.  There is no erythema edema cellulitis drainage odor hallux nail right does demonstrate a nail dystrophy with tenderness on palpation.  Fourth toe does demonstrate a mild reactive hyperkeratotic lesion to the lateral aspect of the fourth toe.  Assessment: Benign soft tissue lesion fourth digit right foot as well as nail dystrophy hallux right.  Plan: I debrided the hallux nail right today thickness and in length and debrided the benign hyperkeratotic lesion.  She tolerated procedure well placed padding follow-up with me as needed

## 2021-05-25 ENCOUNTER — Other Ambulatory Visit: Payer: Medicare Other

## 2021-05-25 ENCOUNTER — Ambulatory Visit: Payer: Medicare Other | Admitting: Internal Medicine

## 2021-05-31 ENCOUNTER — Inpatient Hospital Stay: Payer: Medicare Other

## 2021-05-31 ENCOUNTER — Inpatient Hospital Stay: Payer: Medicare Other | Admitting: Internal Medicine

## 2021-06-01 ENCOUNTER — Other Ambulatory Visit (HOSPITAL_COMMUNITY): Payer: Self-pay

## 2021-06-10 ENCOUNTER — Telehealth: Payer: Self-pay | Admitting: Internal Medicine

## 2021-06-10 DIAGNOSIS — C9110 Chronic lymphocytic leukemia of B-cell type not having achieved remission: Secondary | ICD-10-CM

## 2021-06-10 NOTE — Telephone Encounter (Signed)
Dr. B - please advise. 

## 2021-06-10 NOTE — Telephone Encounter (Signed)
Pt daughter Tressia Miners called to cancel pts 6/29. She stated the patient didn't start her chemo pill until 6/20 and the appt on 6/29 would be to soon to return. What day does the patient need to return to be seen due to late start date.

## 2021-06-13 ENCOUNTER — Telehealth: Payer: Self-pay | Admitting: Pharmacy Technician

## 2021-06-13 NOTE — Telephone Encounter (Signed)
Colette, pls reschedule apt per Dr. Jacinto Reap

## 2021-06-13 NOTE — Addendum Note (Signed)
Addended by: Gloris Ham on: 06/13/2021 08:49 AM   Modules accepted: Orders

## 2021-06-15 ENCOUNTER — Inpatient Hospital Stay: Payer: Medicare Other

## 2021-06-15 ENCOUNTER — Inpatient Hospital Stay: Payer: Medicare Other | Admitting: Internal Medicine

## 2021-06-17 ENCOUNTER — Other Ambulatory Visit (HOSPITAL_COMMUNITY): Payer: Self-pay

## 2021-06-17 NOTE — Telephone Encounter (Signed)
Oral Oncology Patient Advocate Encounter   Was successful in securing patient a $ 10,000 grant from [employer] to provide copayment coverage for his Calquence.  This will keep the out of pocket expense at $0.     I will call the patient to let them know of the approval.  The billing information is as follows and has been shared with [place].   Member ID: [id number] Group ID: [id number] RxBin: [id number] Dates of Eligibility: 03/15/21 through 06/13/22  Fund:  [name] Lindsay [name] Phone [phone] Fax [phone] 06/17/2021 3:43 PM

## 2021-06-21 ENCOUNTER — Other Ambulatory Visit (HOSPITAL_COMMUNITY): Payer: Self-pay

## 2021-06-23 ENCOUNTER — Other Ambulatory Visit (HOSPITAL_COMMUNITY): Payer: Self-pay

## 2021-06-23 ENCOUNTER — Other Ambulatory Visit: Payer: Self-pay | Admitting: Internal Medicine

## 2021-06-23 DIAGNOSIS — C9110 Chronic lymphocytic leukemia of B-cell type not having achieved remission: Secondary | ICD-10-CM

## 2021-06-24 ENCOUNTER — Other Ambulatory Visit (HOSPITAL_COMMUNITY): Payer: Self-pay

## 2021-06-24 MED ORDER — CALQUENCE 100 MG PO CAPS
100.0000 mg | ORAL_CAPSULE | Freq: Every day | ORAL | 3 refills | Status: DC
  Filled 2021-06-24: qty 30, 30d supply, fill #0

## 2021-06-27 ENCOUNTER — Other Ambulatory Visit (HOSPITAL_COMMUNITY): Payer: Self-pay

## 2021-06-28 ENCOUNTER — Other Ambulatory Visit (HOSPITAL_COMMUNITY): Payer: Self-pay

## 2021-07-04 ENCOUNTER — Other Ambulatory Visit: Payer: Self-pay | Admitting: Internal Medicine

## 2021-07-04 ENCOUNTER — Inpatient Hospital Stay (HOSPITAL_BASED_OUTPATIENT_CLINIC_OR_DEPARTMENT_OTHER): Payer: Medicare Other | Admitting: Internal Medicine

## 2021-07-04 ENCOUNTER — Other Ambulatory Visit: Payer: Self-pay | Admitting: *Deleted

## 2021-07-04 ENCOUNTER — Telehealth: Payer: Self-pay | Admitting: Internal Medicine

## 2021-07-04 ENCOUNTER — Other Ambulatory Visit: Payer: Self-pay

## 2021-07-04 ENCOUNTER — Encounter: Payer: Self-pay | Admitting: Internal Medicine

## 2021-07-04 ENCOUNTER — Inpatient Hospital Stay: Payer: Medicare Other | Attending: Internal Medicine

## 2021-07-04 DIAGNOSIS — C9110 Chronic lymphocytic leukemia of B-cell type not having achieved remission: Secondary | ICD-10-CM

## 2021-07-04 DIAGNOSIS — Z79899 Other long term (current) drug therapy: Secondary | ICD-10-CM | POA: Diagnosis not present

## 2021-07-04 DIAGNOSIS — E611 Iron deficiency: Secondary | ICD-10-CM

## 2021-07-04 DIAGNOSIS — D649 Anemia, unspecified: Secondary | ICD-10-CM | POA: Diagnosis not present

## 2021-07-04 LAB — CBC WITH DIFFERENTIAL/PLATELET
Abs Immature Granulocytes: 0.03 10*3/uL (ref 0.00–0.07)
Basophils Absolute: 0.1 10*3/uL (ref 0.0–0.1)
Basophils Relative: 0 %
Eosinophils Absolute: 0.4 10*3/uL (ref 0.0–0.5)
Eosinophils Relative: 1 %
HCT: 28.5 % — ABNORMAL LOW (ref 36.0–46.0)
Hemoglobin: 8.7 g/dL — ABNORMAL LOW (ref 12.0–15.0)
Immature Granulocytes: 0 %
Lymphocytes Relative: 88 %
Lymphs Abs: 26.6 10*3/uL — ABNORMAL HIGH (ref 0.7–4.0)
MCH: 31.9 pg (ref 26.0–34.0)
MCHC: 30.5 g/dL (ref 30.0–36.0)
MCV: 104.4 fL — ABNORMAL HIGH (ref 80.0–100.0)
Monocytes Absolute: 0.6 10*3/uL (ref 0.1–1.0)
Monocytes Relative: 2 %
Neutro Abs: 2.8 10*3/uL (ref 1.7–7.7)
Neutrophils Relative %: 9 %
Platelets: 200 10*3/uL (ref 150–400)
RBC: 2.73 MIL/uL — ABNORMAL LOW (ref 3.87–5.11)
RDW: 14.2 % (ref 11.5–15.5)
Smear Review: ADEQUATE
WBC Morphology: ABNORMAL
WBC: 30.4 10*3/uL — ABNORMAL HIGH (ref 4.0–10.5)
nRBC: 0 % (ref 0.0–0.2)

## 2021-07-04 LAB — IRON AND TIBC
Iron: 34 ug/dL (ref 28–170)
Saturation Ratios: 10 % — ABNORMAL LOW (ref 10.4–31.8)
TIBC: 354 ug/dL (ref 250–450)
UIBC: 320 ug/dL

## 2021-07-04 LAB — COMPREHENSIVE METABOLIC PANEL
ALT: 14 U/L (ref 0–44)
AST: 21 U/L (ref 15–41)
Albumin: 4 g/dL (ref 3.5–5.0)
Alkaline Phosphatase: 95 U/L (ref 38–126)
Anion gap: 10 (ref 5–15)
BUN: 26 mg/dL — ABNORMAL HIGH (ref 8–23)
CO2: 22 mmol/L (ref 22–32)
Calcium: 8.6 mg/dL — ABNORMAL LOW (ref 8.9–10.3)
Chloride: 105 mmol/L (ref 98–111)
Creatinine, Ser: 0.82 mg/dL (ref 0.44–1.00)
GFR, Estimated: >60 mL/min (ref >60)
Glucose, Bld: 131 mg/dL — ABNORMAL HIGH (ref 70–99)
Potassium: 3.6 mmol/L (ref 3.5–5.1)
Sodium: 137 mmol/L (ref 135–145)
Total Bilirubin: 0.4 mg/dL (ref 0.3–1.2)
Total Protein: 6.5 g/dL (ref 6.5–8.1)

## 2021-07-04 LAB — LACTATE DEHYDROGENASE: LDH: 154 U/L (ref 98–192)

## 2021-07-04 LAB — FERRITIN: Ferritin: 13 ng/mL (ref 11–307)

## 2021-07-04 NOTE — Progress Notes (Signed)
Has noticed places on her arms since taking Calquence. Daughter states she was diagnosed with B12 deficiency. Has to start shots and oral B12

## 2021-07-04 NOTE — Progress Notes (Signed)
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 Low level hypermetabolism consistent with CLL.2. Splenomegaly but no focal splenic lesions.  # April 2022- 13 Q DEL; IGVH- UNMUTATED  # MID April 2022- IBRUTINIB 280 mg x3 days [STOPPED sec ER/hypertensive emergency- 191/129]  # MAY 26th, 2022- start acalabrutinib 100 mg once a day [reduced dose]       CLL (chronic lymphocytic leukemia) ([place])  02/14/2021 Initial Diagnosis   CLL (chronic lymphocytic leukemia) ([place])   04/07/2021 Cancer Staging   Staging form: Chronic Lymphocytic Leukemia / Small Lymphocytic Lymphoma, AJCC 8th Edition - Clinical: Modified Rai Stage III (Modified Rai risk: High, Binet: Stage C) - Signed by Cammie Sickle, MD on 04/07/2021  Stage prefix: Initial diagnosis       HISTORY OF PRESENTING ILLNESS:  Lindsay [name] 87 y.o.  female symptomatic CLL currently on acalabrutinib is here for follow-up.  Patient denies any worsening shortness of breath or cough.  Denies any further hypertensive episodes.  Denies any nausea vomiting.  Also notes to have increased bruising but no blood in stools no black or stools no bleeding.  She continues to notice improvement of her neck lymph nodes.  Review of Systems  Constitutional:  Positive for malaise/fatigue. Negative for chills, diaphoresis, fever and weight loss.  HENT:  Negative for nosebleeds and sore throat.   Eyes:  Negative for double vision.   Respiratory:  Negative for cough, hemoptysis, sputum production, shortness of breath and wheezing.   Cardiovascular:  Negative for chest pain, palpitations, orthopnea and leg swelling.  Gastrointestinal:  Negative for abdominal pain, constipation, diarrhea, heartburn, melena, nausea and vomiting.  Genitourinary:  Negative for dysuria, frequency and urgency.  Musculoskeletal:  Negative for back pain and joint pain.  Skin: Negative.  Negative for itching and rash.  Neurological:  Negative for dizziness, tingling, focal weakness, weakness and headaches.  Endo/Heme/Allergies:  Bruises/bleeds easily.  Psychiatric/Behavioral:  Negative for depression. The patient is not nervous/anxious and does not have insomnia.     MEDICAL HISTORY:  Past Medical History:  Diagnosis Date   Arthritis    RHEUMATOID   Edema    MILD ANKLE OCCAS   Fibromyalgia    GERD (gastroesophageal reflux disease)    Hypertension    Kidney stones    RLS (restless legs syndrome)     SURGICAL HISTORY: Past Surgical History:  Procedure Laterality Date   ABDOMINAL HYSTERECTOMY     CATARACT EXTRACTION W/PHACO Left 02/07/2016   Procedure: CATARACT EXTRACTION PHACO AND INTRAOCULAR LENS PLACEMENT ([place]);  Surgeon: Estill Cotta, MD;  Location: [facility] ORS;  Service: Ophthalmology;  Laterality: Left;  [place] 01:33 AP% 24.7 CDE 39.57 fluid pack lot # [id number]   EXTRACORPOREAL SHOCK WAVE LITHOTRIPSY Right 07/03/2019   Procedure: EXTRACORPOREAL SHOCK WAVE LITHOTRIPSY (ESWL);  Surgeon: Royston Cowper, MD;  Location: [facility] OR[facility];  Service: Urology;  Laterality: Right;   EYE SURGERY     HIP ARTHROPLASTY Left 04/20/2020   Procedure: ARTHROPLASTY BIPOLAR HIP (HEMIARTHROPLASTY);  Surgeon: Hessie Knows, MD;  Location: [facility] ORS;  Service: Orthopedics;  Laterality:  Left;   LITHOTRIPSY     TONSILLECTOMY      SOCIAL HISTORY: Social History   Socioeconomic History   Marital status: Married    Spouse name: Not on file   Number of  children: Not on file   Years of education: Not on file   Highest education level: Not on file  Occupational History   Not on file  Tobacco Use   Smoking status: Never   Smokeless tobacco: Never  Substance and Sexual Activity   Alcohol use: No   Drug use: Never   Sexual activity: Not on file  Other Topics Concern   Not on file  Social History Narrative   Lives with daughter, in [place]. Remote smoking; no alcohol. 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    Social Determinants of Health   Financial Resource Strain: Not on file  Food Insecurity: Not on file  Transportation Needs: Not on file  Physical Activity: Not on file  Stress: Not on file  Social Connections: Not on file  Intimate Partner Violence: Not on file    FAMILY HISTORY: Family History  Problem Relation Age of Onset   Cancer Mother        uterine cancer    ALLERGIES:  is allergic to demerol [meperidine] and septra [sulfamethoxazole-trimethoprim].  MEDICATIONS:  Current Outpatient Medications  Medication Sig Dispense Refill   acalabrutinib (CALQUENCE) 100 MG capsule Take 1 capsule (100 mg total) by mouth daily. 30 capsule 3   amitriptyline (ELAVIL) 10 MG tablet Take 20 mg by mouth at bedtime.     amLODipine (NORVASC) 5 MG tablet Take by mouth.     feeding supplement, ENSURE ENLIVE, (ENSURE ENLIVE) LIQD Take 237 mLs by mouth 2 (two) times daily between meals. 237 mL 12   HYDROcodone-acetaminophen (NORCO/VICODIN) 5-325 MG tablet Take 1-2 tablets by mouth every 4 (four) hours as needed for moderate pain (pain score 4-6). 30 tablet 0   Multiple Vitamin (MULTIVITAMIN WITH MINERALS) TABS tablet Take 1 tablet by mouth daily. 30 tablet 0   bisacodyl (DULCOLAX) 10 MG suppository Place 1 suppository (10 mg total) rectally daily as needed for moderate constipation. (Patient not taking: No sig reported) 12 suppository 0   docusate sodium (COLACE) 100 MG capsule Take 1 capsule (100 mg total) by mouth 2 (two) times daily. (Patient not  taking: No sig reported) 10 capsule 0   fluticasone (FLONASE) 50 MCG/ACT nasal spray Place into the nose. (Patient not taking: No sig reported)     No current facility-administered medications for this visit.      Marland Kitchen  PHYSICAL EXAMINATION: ECOG PERFORMANCE STATUS: 0 - Asymptomatic  Vitals:   07/04/21 1505  BP: (!) 158/45  Pulse: 69  Resp: 16  Temp: 99 F (37.2 C)  SpO2: 99%   Filed Weights   07/04/21 1505  Weight: 110 lb 6.4 oz (50.1 kg)    Physical Exam Constitutional:      Comments: Patient walks independently.;  Accompanied by daughter.  HENT:     Head: Normocephalic and atraumatic.     Mouth/Throat:     Pharynx: No oropharyngeal exudate.  Eyes:     Pupils: Pupils are equal, round, and reactive to light.  Neck:     Comments: Improvement of the neck/axilla adenopathy.  However, shotty bilateral neck/other lymph nodes palpated. Cardiovascular:     Rate and Rhythm: Normal rate and regular rhythm.  Pulmonary:     Effort: Pulmonary effort is normal. No respiratory distress.     Breath sounds:  Normal breath sounds. No wheezing.  Abdominal:     General: Bowel sounds are normal. There is no distension.     Palpations: Abdomen is soft. There is no mass.     Tenderness: There is no abdominal tenderness. There is no guarding or rebound.  Musculoskeletal:        General: No tenderness. Normal range of motion.     Cervical back: Normal range of motion and neck supple.  Skin:    General: Skin is warm.     Comments: Chronic multiple bruises noted.  Neurological:     Mental Status: She is alert and oriented to person, place, and time.  Psychiatric:        Mood and Affect: Affect normal.     LABORATORY DATA:  I have reviewed the data as listed Lab Results  Component Value Date   WBC 30.4 (H) 07/04/2021   HGB 8.7 (L) 07/04/2021   HCT 28.5 (L) 07/04/2021   MCV 104.4 (H) 07/04/2021   PLT 200 07/04/2021   Recent Labs    02/14/21 1454 04/08/21 2159 07/04/21 1431   NA 139 136 137  K 4.5 4.3 3.6  CL 105 106 105  CO2 26 23 22   GLUCOSE 105* 102* 131*  BUN 25* 33* 26*  CREATININE 0.88 0.85 0.82  CALCIUM 8.8* 8.8* 8.6*  GFRNONAA >60 >60 >60  PROT 6.7 6.2* 6.5  ALBUMIN 4.2 4.0 4.0  AST 19 25 21   ALT 10 13 14   ALKPHOS 100 91 95  BILITOT 0.6 0.9 0.4    RADIOGRAPHIC STUDIES: I have personally reviewed the radiological images as listed and agreed with the findings in the report. No results found.  ASSESSMENT & PLAN:   CLL (chronic lymphocytic leukemia) ([place]) # CLL: [56% OF NUCLEI POSITIVE FOR A 13Q DELETION]; April 2022-PET scan shows significant lymphadenopathy above and below diaphragm; splenomegaly.  Currently on acalabrutinib 100 mg once a day [since first of June 2022].   # Patient tolerating acalabrutinib very well without any major side effects-except for mild bruising/see below.  Leukocytosis likely from acalabrutinib.  #Anemia mild hemoglobin 8.4-improving from 7.9 likely secondary to CLL.  We will check iron studies/ferritin.   # Elevated blood pressure/hypertensive emergency [sec to ibrutinib]- well controlled-no major hypertensive ssues noted on acalabrutinib.  Monitor closely.  # Easy bruising: Likely secondary to L acalbrutinib.  Monitor closely.  # Hypocalemia: 8.4.  Likely secondary from low vitamin D recommend calcium plus vitamin D twice a day.  # DISPOSITION: please add iron studies/ferritin to labs from today # follow up in 2 months- MD;labs- cbc/cmp/LDH-.Dr.B   All questions were answered. The patient knows to call the clinic with any problems, questions or concerns.    Cammie Sickle, MD 07/04/2021 4:31 PM

## 2021-07-04 NOTE — Progress Notes (Signed)
C-please schedule for possible iron infusion next visit in sep, 2022; GB

## 2021-07-04 NOTE — Telephone Encounter (Signed)
T/K-please inform patient's daughter that patient is low on iron-recommend 65 mg p.o. iron over-the-counter 1 pill once a day/increase iron rich foods-spinach/leafy vegetables etc.  However if iron pill causes abdominal discomfort-reasonable to try IV iron infusion instead.   Please inform daughter that we will set up for possible Venofer at next visit-depending upon the blood counts.  GB

## 2021-07-04 NOTE — Assessment & Plan Note (Addendum)
#   CLL: [56% OF NUCLEI POSITIVE FOR A 13Q DELETION]; April 2022-PET scan shows significant lymphadenopathy above and below diaphragm; splenomegaly.  Currently on acalabrutinib 100 mg once a day [since first of June 2022].   # Patient tolerating acalabrutinib very well without any major side effects-except for mild bruising/see below.  Leukocytosis likely from acalabrutinib.  #Anemia mild hemoglobin 8.4-improving from 7.9 likely secondary to CLL.  We will check iron studies/ferritin.   # Elevated blood pressure/hypertensive emergency [sec to ibrutinib]- well controlled-no major hypertensive ssues noted on acalabrutinib.  Monitor closely.  # Easy bruising: Likely secondary to L acalbrutinib.  Monitor closely.  # Hypocalemia: 8.4.  Likely secondary from low vitamin D recommend calcium plus vitamin D twice a day.  # DISPOSITION: please add iron studies/ferritin to labs from today # follow up in 2 months- MD;labs- cbc/cmp/LDH-.Dr.B

## 2021-07-06 NOTE — Telephone Encounter (Signed)
Pts daughter aware of results and expressed understanding.

## 2021-07-06 NOTE — Telephone Encounter (Signed)
LVM for daughter to call back in regards. Also sent a mychart message with the information below.

## 2021-07-08 ENCOUNTER — Telehealth: Payer: Self-pay | Admitting: *Deleted

## 2021-07-08 NOTE — Telephone Encounter (Signed)
Lindsay [name] - please advise on side effects of Calaquence.

## 2021-07-08 NOTE — Telephone Encounter (Addendum)
Oral Chemotherapy Pharmacist Encounter   Spoke with Ms. Wulff's daughter Leana Roe. Myalgia is a possible side effect of Calquence. She will have Ms. Zeidman hold her Calquence for now due to the leg cramps. Instructed them to use diclofenac OTC gel, acetamemophin, or ibuprofen if needed. I will call them the check in Monday/Tuesday to see if the leg cramps have improved with holding the Calquence.   Darl Pikes, PharmD, BCPS, BCOP, CPP Hematology/Oncology Clinical Pharmacist [facility] [phone]  07/08/2021 3:53 PM

## 2021-07-08 NOTE — Telephone Encounter (Signed)
Patient's daughter called to report that she is having severe leg cramps today.

## 2021-07-11 ENCOUNTER — Telehealth: Payer: Self-pay | Admitting: Pharmacist

## 2021-07-11 DIAGNOSIS — C9110 Chronic lymphocytic leukemia of B-cell type not having achieved remission: Secondary | ICD-10-CM

## 2021-07-11 NOTE — Telephone Encounter (Signed)
[place]  [phone] [phone]  Patient Care Team: [name], MD as PCP - General (Family Medicine)   Name of the patient: Lindsay [name]  EX:[id number]  [birth year]   Date of telephone call: 07/11/21  HPI: Patient is a 87 y.o. female with CLL. Currently treated with Calquence (acalabrutinib). Her daughter called the office on 07/08/21 to report that her mother was having severe leg cramps. Myalgia is a possible side effect of acalabrutinib. Patient was instructed to hold her acalabrutinib.  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 Low level hypermetabolism consistent with CLL.2. Splenomegaly but no focal splenic lesions.  # April 2022- 13 Q DEL; IGVH- UNMUTATED  # MID April 2022- IBRUTINIB 280 mg x3 days [STOPPED sec ER/hypertensive emergency- 191/129]  # MAY 26th, 2022- start acalabrutinib 100 mg once a day [reduced dose]       CLL (chronic lymphocytic leukemia) ([place])  02/14/2021 Initial Diagnosis   CLL (chronic lymphocytic leukemia) ([employer])   04/07/2021 Cancer Staging   Staging form: Chronic Lymphocytic Leukemia / Small Lymphocytic Lymphoma, AJCC 8th Edition - Clinical: Modified Rai Stage III (Modified Rai risk: High,  Binet: Stage C) - Signed by Cammie Sickle, MD on 04/07/2021  Stage prefix: Initial diagnosis      ALLERGIES:  is allergic to demerol [meperidine] and septra [sulfamethoxazole-trimethoprim].  MEDICATIONS:  Current Outpatient Medications  Medication Sig Dispense Refill   acalabrutinib (CALQUENCE) 100 MG capsule Take 1 capsule (100 mg total) by mouth daily. 30 capsule 3   amitriptyline (ELAVIL) 10 MG tablet Take 20 mg by mouth at bedtime.     amLODipine (NORVASC) 5 MG tablet Take by mouth.     bisacodyl (DULCOLAX) 10 MG suppository Place 1 suppository (10 mg total) rectally daily as needed for moderate constipation. (Patient not taking: No sig reported) 12 suppository 0   docusate sodium (COLACE) 100 MG capsule Take 1 capsule (100 mg total) by mouth 2 (two) times daily. (Patient not taking: No sig reported) 10 capsule 0   feeding supplement, ENSURE ENLIVE, (ENSURE ENLIVE) LIQD Take 237 mLs by mouth 2 (two) times daily between meals. 237 mL 12   fluticasone (FLONASE) 50 MCG/ACT nasal spray Place into the nose. (Patient not taking: No sig reported)     HYDROcodone-acetaminophen (NORCO/VICODIN) 5-325 MG tablet Take 1-2 tablets by mouth every 4 (four) hours as needed for moderate pain (pain score 4-6). 30 tablet 0   Multiple Vitamin (MULTIVITAMIN WITH MINERALS) TABS tablet Take 1 tablet by mouth daily. 30 tablet 0   No current facility-administered medications for this visit.    VITAL SIGNS: '@VS'$ @ There were no vitals filed for this visit.  Estimated body mass index is 20.19 kg/m as calculated from the following:   Height as of 07/04/21: '5\' 2"'$  (1.575  m).   Weight as of 07/04/21: 50.1 kg (110 lb 6.4 oz).  LABS: CBC:    Component Value Date/Time   WBC 30.4 (H) 07/04/2021 1431   HGB 8.7 (L) 07/04/2021 1431   HGB 14.1 03/06/2014 2049   HCT 28.5 (L) 07/04/2021 1431   HCT 42.9 03/06/2014 2049   PLT 200 07/04/2021 1431   PLT 240 03/06/2014 2049   MCV 104.4 (H) 07/04/2021 1431   MCV  88 03/06/2014 2049   NEUTROABS 2.8 07/04/2021 1431   NEUTROABS 7.3 (H) 03/06/2014 2049   LYMPHSABS 26.6 (H) 07/04/2021 1431   LYMPHSABS 6.6 (H) 03/06/2014 2049   MONOABS 0.6 07/04/2021 1431   MONOABS 1.3 (H) 03/06/2014 2049   EOSABS 0.4 07/04/2021 1431   EOSABS 0.4 03/06/2014 2049   BASOSABS 0.1 07/04/2021 1431   BASOSABS 0.1 03/06/2014 2049   Comprehensive Metabolic Panel:    Component Value Date/Time   NA 137 07/04/2021 1431   NA 138 03/06/2014 2049   K 3.6 07/04/2021 1431   K 3.6 03/06/2014 2049   CL 105 07/04/2021 1431   CL 107 03/06/2014 2049   CO2 22 07/04/2021 1431   CO2 29 03/06/2014 2049   BUN 26 (H) 07/04/2021 1431   BUN 14 03/06/2014 2049   CREATININE 0.82 07/04/2021 1431   CREATININE 0.75 03/06/2014 2049   GLUCOSE 131 (H) 07/04/2021 1431   GLUCOSE 103 (H) 03/06/2014 2049   CALCIUM 8.6 (L) 07/04/2021 1431   CALCIUM 8.9 03/06/2014 2049   AST 21 07/04/2021 1431   ALT 14 07/04/2021 1431   ALKPHOS 95 07/04/2021 1431   BILITOT 0.4 07/04/2021 1431   PROT 6.5 07/04/2021 1431   ALBUMIN 4.0 07/04/2021 1431     On the line during today's call: patient and her daughter Tracie  Assessment and Plan: After stopping her acalabrutinib on Friday 07/08/21, her cramps resolved on Saturday 07/09/21. She is feeling better now and would like to restart her acalabrutinib.   She was already on a reduced dose of acalabrutinib when the cramps occurred, acalabrutinib '100mg'$  once daily. She will resume at acalabrutinib '100mg'$  every other day starting tomorrow 07/12/21. She knows to stop the acalabrutinib and call if the cramps return.       Patient expressed understanding and was in agreement with this plan. She also understands that She can call clinic at any time with any questions, concerns, or complaints.   Thank you for allowing me to participate in the care of this very pleasant patient.   Time Total: 15 mins  Visit consisted of counseling and education on dealing with issues  of symptom management in the setting of serious and potentially life-threatening illness.Greater than 50%  of this time was spent counseling and coordinating care related to the above assessment and plan.  Signed by: Darl Pikes, PharmD, BCPS, Salley Slaughter, CPP Hematology/Oncology Clinical Pharmacist Practitioner [facility] [phone]  07/11/2021 5:07 PM

## 2021-07-19 ENCOUNTER — Other Ambulatory Visit (HOSPITAL_COMMUNITY): Payer: Self-pay

## 2021-07-19 ENCOUNTER — Other Ambulatory Visit: Payer: Self-pay | Admitting: Pharmacist

## 2021-07-19 DIAGNOSIS — C9110 Chronic lymphocytic leukemia of B-cell type not having achieved remission: Secondary | ICD-10-CM

## 2021-07-19 MED ORDER — ACALABRUTINIB 100 MG PO CAPS
100.0000 mg | ORAL_CAPSULE | ORAL | 0 refills | Status: DC
  Filled 2021-07-19: qty 15, 30d supply, fill #0

## 2021-07-22 ENCOUNTER — Telehealth: Payer: Self-pay | Admitting: Pharmacist

## 2021-07-22 NOTE — Telephone Encounter (Signed)
Oral Chemotherapy Pharmacist Encounter   Received a VM from patient's daughter Leana Roe, who reported that her mom had another cramp on her Calquence. I called Ms. Bonton to find out additional information. She said that the had a cramp last night ~4am, it lasted for about 25mns and was not as severe as last time.  She will continue on her acalabrutinib '100mg'$  every other day. She takes hydrocodone/APAP already for her arthritis pain, due to this we would like to avoid additional oral pain management. Suggested she use OTC topical diclofenac if needed. In her cramps becomes severe like it was previously she will hold her acalabrutinib and call the office.  Called Tracie to inform her of the above conversation with her mom.  Dr. BRogue Bussingwould like to move up the f/u appt with Ms. Coupe to two weeks from now.   ADarl Pikes PharmD, BCPS, BCOP, CPP Hematology/Oncology Clinical Pharmacist [phone] 07/22/2021 4:02 PM

## 2021-07-27 ENCOUNTER — Other Ambulatory Visit: Payer: Self-pay

## 2021-07-27 ENCOUNTER — Inpatient Hospital Stay: Payer: Medicare Other

## 2021-07-27 ENCOUNTER — Inpatient Hospital Stay (HOSPITAL_BASED_OUTPATIENT_CLINIC_OR_DEPARTMENT_OTHER): Payer: Medicare Other | Admitting: Internal Medicine

## 2021-07-27 ENCOUNTER — Inpatient Hospital Stay: Payer: Medicare Other | Attending: Internal Medicine

## 2021-07-27 ENCOUNTER — Inpatient Hospital Stay: Payer: Medicare Other | Admitting: Pharmacist

## 2021-07-27 DIAGNOSIS — R252 Cramp and spasm: Secondary | ICD-10-CM | POA: Insufficient documentation

## 2021-07-27 DIAGNOSIS — R5383 Other fatigue: Secondary | ICD-10-CM | POA: Insufficient documentation

## 2021-07-27 DIAGNOSIS — M797 Fibromyalgia: Secondary | ICD-10-CM | POA: Diagnosis not present

## 2021-07-27 DIAGNOSIS — Z79899 Other long term (current) drug therapy: Secondary | ICD-10-CM | POA: Insufficient documentation

## 2021-07-27 DIAGNOSIS — C9110 Chronic lymphocytic leukemia of B-cell type not having achieved remission: Secondary | ICD-10-CM

## 2021-07-27 DIAGNOSIS — D649 Anemia, unspecified: Secondary | ICD-10-CM | POA: Insufficient documentation

## 2021-07-27 DIAGNOSIS — M069 Rheumatoid arthritis, unspecified: Secondary | ICD-10-CM | POA: Insufficient documentation

## 2021-07-27 DIAGNOSIS — I10 Essential (primary) hypertension: Secondary | ICD-10-CM | POA: Insufficient documentation

## 2021-07-27 DIAGNOSIS — K219 Gastro-esophageal reflux disease without esophagitis: Secondary | ICD-10-CM | POA: Insufficient documentation

## 2021-07-27 LAB — CBC WITH DIFFERENTIAL/PLATELET
Abs Immature Granulocytes: 0.03 10*3/uL (ref 0.00–0.07)
Basophils Absolute: 0.1 10*3/uL (ref 0.0–0.1)
Basophils Relative: 0 %
Eosinophils Absolute: 0.3 10*3/uL (ref 0.0–0.5)
Eosinophils Relative: 1 %
HCT: 31.5 % — ABNORMAL LOW (ref 36.0–46.0)
Hemoglobin: 9.8 g/dL — ABNORMAL LOW (ref 12.0–15.0)
Immature Granulocytes: 0 %
Lymphocytes Relative: 77 %
Lymphs Abs: 15.5 10*3/uL — ABNORMAL HIGH (ref 0.7–4.0)
MCH: 31.2 pg (ref 26.0–34.0)
MCHC: 31.1 g/dL (ref 30.0–36.0)
MCV: 100.3 fL — ABNORMAL HIGH (ref 80.0–100.0)
Monocytes Absolute: 1.4 10*3/uL — ABNORMAL HIGH (ref 0.1–1.0)
Monocytes Relative: 7 %
Neutro Abs: 3.2 10*3/uL (ref 1.7–7.7)
Neutrophils Relative %: 15 %
Platelets: 193 10*3/uL (ref 150–400)
RBC: 3.14 MIL/uL — ABNORMAL LOW (ref 3.87–5.11)
RDW: 13.1 % (ref 11.5–15.5)
Smear Review: NORMAL
WBC: 20.5 10*3/uL — ABNORMAL HIGH (ref 4.0–10.5)
nRBC: 0 % (ref 0.0–0.2)

## 2021-07-27 LAB — COMPREHENSIVE METABOLIC PANEL
ALT: 10 U/L (ref 0–44)
AST: 19 U/L (ref 15–41)
Albumin: 4 g/dL (ref 3.5–5.0)
Alkaline Phosphatase: 95 U/L (ref 38–126)
Anion gap: 5 (ref 5–15)
BUN: 23 mg/dL (ref 8–23)
CO2: 27 mmol/L (ref 22–32)
Calcium: 8.8 mg/dL — ABNORMAL LOW (ref 8.9–10.3)
Chloride: 106 mmol/L (ref 98–111)
Creatinine, Ser: 0.91 mg/dL (ref 0.44–1.00)
GFR, Estimated: >60 mL/min (ref >60)
Glucose, Bld: 103 mg/dL — ABNORMAL HIGH (ref 70–99)
Potassium: 4.2 mmol/L (ref 3.5–5.1)
Sodium: 138 mmol/L (ref 135–145)
Total Bilirubin: 0.6 mg/dL (ref 0.3–1.2)
Total Protein: 6.3 g/dL — ABNORMAL LOW (ref 6.5–8.1)

## 2021-07-27 LAB — LACTATE DEHYDROGENASE: LDH: 146 U/L (ref 98–192)

## 2021-07-27 NOTE — Assessment & Plan Note (Addendum)
#   CLL: [56% OF NUCLEI POSITIVE FOR A 13Q DELETION]; April 2022-PET scan shows significant lymphadenopathy above and below diaphragm; splenomegaly.  Most recently on acalabrutinib 100 mg once a day [since first of June 2022].   #However patient noted to have significant cramps in the bilateral lower extremities [see below]; I would recommend continued holding acalabrutinib at this time.  We will have a discussion regarding resumption in 1 month/next visit.  #Bilateral lower extremity cramping- [even with 1 pill a day; also 1 pill every other day]-recommend in general increase hydration/massaging the legs/lower extremity toning exercises; magnesium supplementation [slow mag]  # Patient tolerating acalabrutinib very well without any major side effects-except for mild bruising/see below.  Leukocytosis likely from acalabrutinib  #Anemia mild hemoglobin nadir 7.9 [May 2022]-iron saturation 9%; oral iron-today hemoglobin is 9.8.  # Elevated blood pressure/hypertensive emergency [sec to ibrutinib]- well controlled-no major hypertensive ssues noted on acalabrutinib.  Monitor closely.  Recommend bringing a log of blood blood pressures.  # Hypocalemia: 8.4.  Likely secondary from low vitamin D recommend calcium plus vitamin D twice a day.  # DISPOSITION: # follow up in 1 months- MD;labs- cbc/cmp/LDH-.Dr.B

## 2021-07-27 NOTE — Progress Notes (Signed)
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 Low level hypermetabolism consistent with CLL.2. Splenomegaly but no focal splenic lesions.  # April 2022- 13 Q DEL; IGVH- UNMUTATED  # MID April 2022- IBRUTINIB 280 mg x3 days [STOPPED sec ER/hypertensive emergency- 191/129]  # MAY 26th, 2022- start acalabrutinib 100 mg once a day [reduced dose]       CLL (chronic lymphocytic leukemia) ([place])  02/14/2021 Initial Diagnosis   CLL (chronic lymphocytic leukemia) ([place])   04/07/2021 Cancer Staging   Staging form: Chronic Lymphocytic Leukemia / Small Lymphocytic Lymphoma, AJCC 8th Edition - Clinical: Modified Rai Stage III (Modified Rai risk: High, Binet: Stage C) - Signed by Cammie Sickle, MD on 04/07/2021  Stage prefix: Initial diagnosis       HISTORY OF PRESENTING ILLNESS:  Lindsay [name] 87 y.o.  female symptomatic CLL currently on acalabrutinib is here for follow-up.  However patient stopped acalabrutinib on August 4 because of poor tolerance.  Patient was taking 1 pill every other day when she noted to have worsening cramps especially nighttime.  She is concerned about increasing size of the lymph nodes under her arm/neck.  There is no nausea no vomiting..  Review of Systems  Constitutional:  Positive for malaise/fatigue. Negative for chills, diaphoresis, fever and weight loss.  HENT:  Negative for nosebleeds and sore throat.   Eyes:  Negative for double vision.   Respiratory:  Negative for cough, hemoptysis, sputum production, shortness of breath and wheezing.   Cardiovascular:  Negative for chest pain, palpitations, orthopnea and leg swelling.  Gastrointestinal:  Negative for abdominal pain, constipation, diarrhea, heartburn, melena, nausea and vomiting.  Genitourinary:  Negative for dysuria, frequency and urgency.  Musculoskeletal:  Negative for back pain and joint pain.  Skin: Negative.  Negative for itching and rash.  Neurological:  Negative for dizziness, tingling, focal weakness, weakness and headaches.  Endo/Heme/Allergies:  Bruises/bleeds easily.  Psychiatric/Behavioral:  Negative for depression. The patient is not nervous/anxious and does not have insomnia.     MEDICAL HISTORY:  Past Medical History:  Diagnosis Date   Arthritis    RHEUMATOID   Edema    MILD ANKLE OCCAS   Fibromyalgia    GERD (gastroesophageal reflux disease)    Hypertension    Kidney stones    RLS (restless legs syndrome)     SURGICAL HISTORY: Past Surgical History:  Procedure Laterality Date   ABDOMINAL HYSTERECTOMY     CATARACT EXTRACTION W/PHACO Left 02/07/2016   Procedure: CATARACT EXTRACTION PHACO AND INTRAOCULAR LENS PLACEMENT ([place]);  Surgeon: Estill Cotta, MD;  Location: [facility] ORS;  Service: Ophthalmology;  Laterality: Left;  [place] 01:33 AP% 24.7 CDE 39.57 fluid pack lot # [id number]   EXTRACORPOREAL SHOCK WAVE LITHOTRIPSY Right 07/03/2019   Procedure: EXTRACORPOREAL SHOCK WAVE LITHOTRIPSY (ESWL);  Surgeon: Royston Cowper, MD;  Location: [facility] ORS;  Service: Urology;  Laterality: Right;   EYE SURGERY     HIP ARTHROPLASTY Left 04/20/2020   Procedure: ARTHROPLASTY BIPOLAR HIP (HEMIARTHROPLASTY);  Surgeon: Hessie Knows, MD;  Location: [facility] ORS;  Service: Orthopedics;  Laterality: Left;   LITHOTRIPSY     TONSILLECTOMY      SOCIAL HISTORY: Social History   Socioeconomic History   Marital status: Married    Spouse name: Not on file   Number of  children: Not on file   Years of education: Not on file   Highest education level: Not on file  Occupational History   Not on file  Tobacco Use   Smoking status: Never   Smokeless tobacco: Never  Substance and Sexual Activity   Alcohol use: No   Drug use: Never   Sexual activity: Not on file  Other Topics Concern   Not on file  Social History Narrative   Lives with daughter, in [place]. Remote smoking; no alcohol. 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    Social Determinants of Health   Financial Resource Strain: Not on file  Food Insecurity: Not on file  Transportation Needs: Not on file  Physical Activity: Not on file  Stress: Not on file  Social Connections: Not on file  Intimate Partner Violence: Not on file    FAMILY HISTORY: Family History  Problem Relation Age of Onset   Cancer Mother        uterine cancer    ALLERGIES:  is allergic to demerol [meperidine] and septra [sulfamethoxazole-trimethoprim].  MEDICATIONS:  Current Outpatient Medications  Medication Sig Dispense Refill   amitriptyline (ELAVIL) 10 MG tablet Take 20 mg by mouth at bedtime.     amLODipine (NORVASC) 5 MG tablet Take 5 mg by mouth daily.     feeding supplement, ENSURE ENLIVE, (ENSURE ENLIVE) LIQD Take 237 mLs by mouth 2 (two) times daily between meals. 237 mL 12   HYDROcodone-acetaminophen (NORCO/VICODIN) 5-325 MG tablet Take 1-2 tablets by mouth every 4 (four) hours as needed for moderate pain (pain score 4-6). 30 tablet 0   acalabrutinib (CALQUENCE) 100 MG capsule Take 1 capsule (100 mg total) by mouth every other day. (Patient not taking: Reported on 07/27/2021) 15 capsule 0   bisacodyl (DULCOLAX) 10 MG suppository Place 1 suppository (10 mg total) rectally daily as needed for moderate constipation. (Patient not taking: No sig reported) 12 suppository 0   docusate sodium (COLACE) 100 MG capsule Take 1 capsule (100 mg total) by mouth 2 (two) times daily. (Patient not taking: No sig reported) 10 capsule 0    fluticasone (FLONASE) 50 MCG/ACT nasal spray Place into the nose. (Patient not taking: No sig reported)     Multiple Vitamin (MULTIVITAMIN WITH MINERALS) TABS tablet Take 1 tablet by mouth daily. (Patient not taking: Reported on 07/27/2021) 30 tablet 0   No current facility-administered medications for this visit.      Marland Kitchen  PHYSICAL EXAMINATION: ECOG PERFORMANCE STATUS: 0 - Asymptomatic  Vitals:   07/27/21 1049  BP: (!) 156/41  Pulse: (!) 59  Resp: 20  Temp: (!) 97.2 F (36.2 C)   Filed Weights   07/27/21 1049  Weight: 109 lb (49.4 kg)    Physical Exam Constitutional:      Comments: Patient walks independently.;  Accompanied by daughter.  HENT:     Head: Normocephalic and atraumatic.     Mouth/Throat:     Pharynx: No oropharyngeal exudate.  Eyes:     Pupils: Pupils are equal, round, and reactive to light.  Neck:     Comments: Improvement of the neck/axilla adenopathy.  However, shotty bilateral neck/other lymph nodes palpated. Cardiovascular:     Rate and Rhythm: Normal rate and regular rhythm.  Pulmonary:  Effort: Pulmonary effort is normal. No respiratory distress.     Breath sounds: Normal breath sounds. No wheezing.  Abdominal:     General: Bowel sounds are normal. There is no distension.     Palpations: Abdomen is soft. There is no mass.     Tenderness: no abdominal tenderness There is no guarding or rebound.  Musculoskeletal:        General: No tenderness. Normal range of motion.     Cervical back: Normal range of motion and neck supple.  Skin:    General: Skin is warm.     Comments: Chronic multiple bruises noted.  Neurological:     Mental Status: She is alert and oriented to person, place, and time.  Psychiatric:        Mood and Affect: Affect normal.     LABORATORY DATA:  I have reviewed the data as listed Lab Results  Component Value Date   WBC 20.5 (H) 07/27/2021   HGB 9.8 (L) 07/27/2021   HCT 31.5 (L) 07/27/2021   MCV 100.3 (H) 07/27/2021    PLT 193 07/27/2021   Recent Labs    04/08/21 2159 07/04/21 1431 07/27/21 1023  NA 136 137 138  K 4.3 3.6 4.2  CL 106 105 106  CO2 '23 22 27  '$ GLUCOSE 102* 131* 103*  BUN 33* 26* 23  CREATININE 0.85 0.82 0.91  CALCIUM 8.8* 8.6* 8.8*  GFRNONAA >60 >60 >60  PROT 6.2* 6.5 6.3*  ALBUMIN 4.0 4.0 4.0  AST '25 21 19  '$ ALT '13 14 10  '$ ALKPHOS 91 95 95  BILITOT 0.9 0.4 0.6    RADIOGRAPHIC STUDIES: I have personally reviewed the radiological images as listed and agreed with the findings in the report. No results found.  ASSESSMENT & PLAN:   CLL (chronic lymphocytic leukemia) ([place]) # CLL: [56% OF NUCLEI POSITIVE FOR A 13Q DELETION]; April 2022-PET scan shows significant lymphadenopathy above and below diaphragm; splenomegaly.  Most recently on acalabrutinib 100 mg once a day [since first of June 2022].   #However patient noted to have significant cramps in the bilateral lower extremities [see below]; I would recommend continued holding acalabrutinib at this time.  We will have a discussion regarding resumption in 1 month/next visit.  #Bilateral lower extremity cramping- [even with 1 pill a day; also 1 pill every other day]-recommend in general increase hydration/massaging the legs/lower extremity toning exercises; magnesium supplementation [slow mag]  # Patient tolerating acalabrutinib very well without any major side effects-except for mild bruising/see below.  Leukocytosis likely from acalabrutinib  #Anemia mild hemoglobin nadir 7.9 [May 2022]-iron saturation 9%; oral iron-today hemoglobin is 9.8.  # Elevated blood pressure/hypertensive emergency [sec to ibrutinib]- well controlled-no major hypertensive ssues noted on acalabrutinib.  Monitor closely.  Recommend bringing a log of blood blood pressures.  # Hypocalemia: 8.4.  Likely secondary from low vitamin D recommend calcium plus vitamin D twice a day.  # DISPOSITION: # follow up in 1 months- MD;labs- cbc/cmp/[facility]-.Dr.B   All  questions were answered. The patient knows to call the clinic with any problems, questions or concerns.    Cammie Sickle, MD 07/27/2021 2:56 PM

## 2021-08-04 ENCOUNTER — Other Ambulatory Visit: Payer: Medicare Other

## 2021-08-04 ENCOUNTER — Ambulatory Visit: Payer: Medicare Other | Admitting: Pharmacist

## 2021-08-04 ENCOUNTER — Ambulatory Visit: Payer: Medicare Other

## 2021-08-04 ENCOUNTER — Ambulatory Visit: Payer: Medicare Other | Admitting: Internal Medicine

## 2021-08-10 ENCOUNTER — Ambulatory Visit
Admission: RE | Admit: 2021-08-10 | Discharge: 2021-08-10 | Disposition: A | Discharge status: Home / Self Care | Payer: Medicare Other | Attending: Hospice and Palliative Medicine | Admitting: Hospice and Palliative Medicine

## 2021-08-10 ENCOUNTER — Other Ambulatory Visit: Payer: Self-pay

## 2021-08-10 ENCOUNTER — Inpatient Hospital Stay (HOSPITAL_BASED_OUTPATIENT_CLINIC_OR_DEPARTMENT_OTHER): Payer: Medicare Other | Admitting: Hospice and Palliative Medicine

## 2021-08-10 ENCOUNTER — Ambulatory Visit
Admission: RE | Admit: 2021-08-10 | Discharge: 2021-08-10 | Disposition: A | Discharge status: Home / Self Care | Payer: Medicare Other | Source: Ambulatory Visit | Attending: Hospice and Palliative Medicine | Admitting: Hospice and Palliative Medicine

## 2021-08-10 ENCOUNTER — Telehealth: Payer: Self-pay | Admitting: *Deleted

## 2021-08-10 DIAGNOSIS — R059 Cough, unspecified: Secondary | ICD-10-CM | POA: Insufficient documentation

## 2021-08-10 DIAGNOSIS — C9110 Chronic lymphocytic leukemia of B-cell type not having achieved remission: Secondary | ICD-10-CM | POA: Insufficient documentation

## 2021-08-10 MED ORDER — PREDNISONE 10 MG PO TABS: ORAL_TABLET | ORAL | 0 refills | Status: DC

## 2021-08-10 MED ORDER — AMOXICILLIN-POT CLAVULANATE 875-125 MG PO TABS: 1.0000 | ORAL_TABLET | Freq: Two times a day (BID) | ORAL | 0 refills | Status: DC

## 2021-08-10 NOTE — Telephone Encounter (Signed)
Roxie- fyi

## 2021-08-10 NOTE — Telephone Encounter (Signed)
I spoke with Lindsay [name]- patient needs Covid test- pcr.  Chest xray and then virtual visit asap

## 2021-08-10 NOTE — Telephone Encounter (Signed)
Patient called to report that since being taken off her oral chemo she has become very congested especially at rest. She has productive cough with green tinged sputum.

## 2021-08-10 NOTE — Progress Notes (Signed)
Symptom Management Clinic Virtual Visit via Telephone Note  I connected with Lindsay [name] on 08/10/21 at  1:00 PM EDT by telephone and verified that I am speaking with the correct person using two identifiers.  Location: Patient: Home Provider: Clinic   I discussed the limitations, risks, security and privacy concerns of performing an evaluation and management service by telephone and the availability of in person appointments. I also discussed with the patient that there may be a patient responsible charge related to this service. The patient expressed understanding and agreed to proceed.   History of Present Illness: Ms. Lindsay [name] is an 87 year old woman with multiple medical problems including stage III CLL previously treated ibrutinib, which was subsequently discontinued due to hypertensive emergency.  Patient has most recently been treated on acalabrutinib.  However, the acalabrutinib was held at time of last ED visit 07/27/2021 due to persistent muscle cramps.  Patient requested [facility] visit on 08/10/2021 with complaint of nighttime shortness of breath/wheezing and productive cough.  Observations/Objective: Spoke with patient and daughter by phone.  Patient reports 24 to 48 hours of productive cough with green sputum, which is worse at nighttime when she tries to lay down.  She says she is fine during the day but is also having nighttime wheezing and shortness of breath that is interfering with her sleep.  She reports chronic subjective fever/chills.  Reports T-max as 99.0.  She denies sore throat, rhinorrhea, postnasal drip, facial tenderness.  No GI or GU symptoms.  Patient says that she has felt worse following discontinuation of acalabrutinib.  She says that she had similar symptoms after the ibrutinib was discontinued.  She reports having visualized noticeable lymphadenopathy across her chest.  Assessment and Plan: CLL -likely symptoms are explained by thoracic lymphadenopathy.   However, patient is also high risk for infectious processes.  Chest x-ray did not reveal any acute findings.  Discussed with Dr. Rogue Bussing and will start patient empirically on Augmentin x7 days.  We will also start prednisone taper over 2 weeks.  Patient instructed to resume acalabrutinib in 1 week and to resume every other day dosing.  Discussed ER triggers with patient/daughter.    Follow Up Instructions: Patient to follow-up with Dr. Rogue Bussing on 08/29/2021 or to utilize [facility] for visit sooner if symptoms are not improving.   I discussed the assessment and treatment plan with the patient. The patient was provided an opportunity to ask questions and all were answered. The patient agreed with the plan and demonstrated an understanding of the instructions.   The patient was advised to call back or seek an in-person evaluation if the symptoms worsen or if the condition fails to improve as anticipated.  I provided 15 minutes of non-face-to-face time during this encounter.   Irean Hong, NP

## 2021-08-10 NOTE — Telephone Encounter (Signed)
Per daughter- symptoms - low grade fever. She had the productive cough in the past before starting her oral chemotherapy. Daughter states that when patient was being treated acalabrutinib the cough had subsided. Since she is been off treatment, the cough has returned. Concerns for daughter acknowledge, I explained that given the sputum is green, she may have an underlying infection. I spoke with Merrily Pew- NP prefers to do a virtual visit today, cxr prior and have patient get a covid test pcr. Daughter agreeable to the plan.

## 2021-08-10 NOTE — Telephone Encounter (Signed)
Alyson - FYI 

## 2021-08-17 ENCOUNTER — Other Ambulatory Visit (HOSPITAL_COMMUNITY): Payer: Self-pay

## 2021-08-19 ENCOUNTER — Other Ambulatory Visit (HOSPITAL_COMMUNITY): Payer: Self-pay

## 2021-08-25 ENCOUNTER — Other Ambulatory Visit: Payer: Medicare Other

## 2021-08-25 ENCOUNTER — Ambulatory Visit: Payer: Medicare Other

## 2021-08-25 ENCOUNTER — Ambulatory Visit: Payer: Medicare Other | Admitting: Internal Medicine

## 2021-08-25 ENCOUNTER — Ambulatory Visit: Payer: Medicare Other | Admitting: Pharmacist

## 2021-08-26 ENCOUNTER — Other Ambulatory Visit (HOSPITAL_COMMUNITY): Payer: Self-pay

## 2021-08-29 ENCOUNTER — Inpatient Hospital Stay (HOSPITAL_BASED_OUTPATIENT_CLINIC_OR_DEPARTMENT_OTHER): Payer: Medicare Other | Admitting: Internal Medicine

## 2021-08-29 ENCOUNTER — Inpatient Hospital Stay: Payer: Medicare Other | Attending: Internal Medicine

## 2021-08-29 ENCOUNTER — Encounter: Payer: Self-pay | Admitting: Internal Medicine

## 2021-08-29 DIAGNOSIS — C9110 Chronic lymphocytic leukemia of B-cell type not having achieved remission: Secondary | ICD-10-CM | POA: Diagnosis present

## 2021-08-29 DIAGNOSIS — D649 Anemia, unspecified: Secondary | ICD-10-CM | POA: Diagnosis not present

## 2021-08-29 DIAGNOSIS — Z79899 Other long term (current) drug therapy: Secondary | ICD-10-CM | POA: Insufficient documentation

## 2021-08-29 DIAGNOSIS — Z90710 Acquired absence of both cervix and uterus: Secondary | ICD-10-CM | POA: Diagnosis not present

## 2021-08-29 DIAGNOSIS — I10 Essential (primary) hypertension: Secondary | ICD-10-CM | POA: Diagnosis not present

## 2021-08-29 LAB — COMPREHENSIVE METABOLIC PANEL
ALT: 13 U/L (ref 0–44)
AST: 21 U/L (ref 15–41)
Albumin: 4.2 g/dL (ref 3.5–5.0)
Alkaline Phosphatase: 85 U/L (ref 38–126)
Anion gap: 7 (ref 5–15)
BUN: 19 mg/dL (ref 8–23)
CO2: 26 mmol/L (ref 22–32)
Calcium: 8.8 mg/dL — ABNORMAL LOW (ref 8.9–10.3)
Chloride: 105 mmol/L (ref 98–111)
Creatinine, Ser: 0.84 mg/dL (ref 0.44–1.00)
GFR, Estimated: >60 mL/min (ref >60)
Glucose, Bld: 130 mg/dL — ABNORMAL HIGH (ref 70–99)
Potassium: 4 mmol/L (ref 3.5–5.1)
Sodium: 138 mmol/L (ref 135–145)
Total Bilirubin: 0.5 mg/dL (ref 0.3–1.2)
Total Protein: 6.4 g/dL — ABNORMAL LOW (ref 6.5–8.1)

## 2021-08-29 LAB — CBC WITH DIFFERENTIAL/PLATELET
Abs Immature Granulocytes: 0.03 10*3/uL (ref 0.00–0.07)
Basophils Absolute: 0.1 10*3/uL (ref 0.0–0.1)
Basophils Relative: 1 %
Eosinophils Absolute: 0.4 10*3/uL (ref 0.0–0.5)
Eosinophils Relative: 3 %
HCT: 32.6 % — ABNORMAL LOW (ref 36.0–46.0)
Hemoglobin: 10.1 g/dL — ABNORMAL LOW (ref 12.0–15.0)
Immature Granulocytes: 0 %
Lymphocytes Relative: 68 %
Lymphs Abs: 9.2 10*3/uL — ABNORMAL HIGH (ref 0.7–4.0)
MCH: 30.5 pg (ref 26.0–34.0)
MCHC: 31 g/dL (ref 30.0–36.0)
MCV: 98.5 fL (ref 80.0–100.0)
Monocytes Absolute: 1.4 10*3/uL — ABNORMAL HIGH (ref 0.1–1.0)
Monocytes Relative: 10 %
Neutro Abs: 2.4 10*3/uL (ref 1.7–7.7)
Neutrophils Relative %: 18 %
Platelets: 185 10*3/uL (ref 150–400)
RBC: 3.31 MIL/uL — ABNORMAL LOW (ref 3.87–5.11)
RDW: 13.9 % (ref 11.5–15.5)
Smear Review: NORMAL
WBC: 13.5 10*3/uL — ABNORMAL HIGH (ref 4.0–10.5)
nRBC: 0 % (ref 0.0–0.2)

## 2021-08-29 NOTE — Progress Notes (Signed)
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 Low level hypermetabolism consistent with CLL.2. Splenomegaly but no focal splenic lesions.  # April 2022- 13 Q DEL; IGVH- UNMUTATED  # MID April 2022- IBRUTINIB 280 mg x3 days [STOPPED sec ER/hypertensive emergency- 191/129]  # MAY 26th, 2022- start acalabrutinib 100 mg once a day [reduced dose]; intermittent; discontinued  September mid 2022. [Muscle cramps.]       CLL (chronic lymphocytic leukemia) ([place])  02/14/2021 Initial Diagnosis   CLL (chronic lymphocytic leukemia) ([place])   04/07/2021 Cancer Staging   Staging form: Chronic Lymphocytic Leukemia / Small Lymphocytic Lymphoma, AJCC 8th Edition - Clinical: Modified Rai Stage III (Modified Rai risk: High, Binet: Stage C) - Signed by Cammie Sickle, MD on 04/07/2021 Stage prefix: Initial diagnosis      HISTORY OF PRESENTING ILLNESS: Frail-appearing Caucasian female patient/accompanied by daughter.  Ambulating independently. Lindsay [name] 87 y.o.  female symptomatic CLL currently on acalabrutinib is here for follow-up.  Patient continues to have cramps from every other day acalabrutinib.  She finally discontinued.  Noticed increasing size of the neck lymph nodes/underarm lymph nodes.  Review of Systems  Constitutional:  Positive for malaise/fatigue. Negative for chills, diaphoresis, fever and weight loss.  HENT:  Negative for nosebleeds and sore throat.   Eyes:   Negative for double vision.  Respiratory:  Negative for cough, hemoptysis, sputum production, shortness of breath and wheezing.   Cardiovascular:  Negative for chest pain, palpitations, orthopnea and leg swelling.  Gastrointestinal:  Negative for abdominal pain, constipation, diarrhea, heartburn, melena, nausea and vomiting.  Genitourinary:  Negative for dysuria, frequency and urgency.  Musculoskeletal:  Negative for back pain and joint pain.  Skin: Negative.  Negative for itching and rash.  Neurological:  Negative for dizziness, tingling, focal weakness, weakness and headaches.  Endo/Heme/Allergies:  Bruises/bleeds easily.  Psychiatric/Behavioral:  Negative for depression. The patient is not nervous/anxious and does not have insomnia.     MEDICAL HISTORY:  Past Medical History:  Diagnosis Date   Arthritis    RHEUMATOID   Edema    MILD ANKLE OCCAS   Fibromyalgia    GERD (gastroesophageal reflux disease)    Hypertension    Kidney stones    RLS (restless legs syndrome)     SURGICAL HISTORY: Past Surgical History:  Procedure Laterality Date   ABDOMINAL HYSTERECTOMY     CATARACT EXTRACTION W/PHACO Left 02/07/2016   Procedure: CATARACT EXTRACTION PHACO AND INTRAOCULAR LENS PLACEMENT ([place]);  Surgeon: Estill Cotta, MD;  Location: [facility] ORS;  Service: Ophthalmology;  Laterality: Left;  [place] 01:33 AP% 24.7 CDE 39.57 fluid pack lot # [id number]   EXTRACORPOREAL SHOCK WAVE LITHOTRIPSY Right 07/03/2019   Procedure: EXTRACORPOREAL SHOCK WAVE LITHOTRIPSY (ESWL);  Surgeon: Royston Cowper, MD;  Location: [facility] ORS;  Service: Urology;  Laterality: Right;   EYE SURGERY     HIP ARTHROPLASTY Left 04/20/2020   Procedure: ARTHROPLASTY BIPOLAR HIP (HEMIARTHROPLASTY);  Surgeon: Hessie Knows, MD;  Location: [facility] ORS;  Service: Orthopedics;  Laterality: Left;   LITHOTRIPSY     TONSILLECTOMY  SOCIAL HISTORY: Social History   Socioeconomic History   Marital status: Married    Spouse name: Not  on file   Number of children: Not on file   Years of education: Not on file   Highest education level: Not on file  Occupational History   Not on file  Tobacco Use   Smoking status: Never   Smokeless tobacco: Never  Substance and Sexual Activity   Alcohol use: No   Drug use: Never   Sexual activity: Not on file  Other Topics Concern   Not on file  Social History Narrative   Lives with daughter, in [place]. Remote smoking; no alcohol. 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    Social Determinants of Health   Financial Resource Strain: Not on file  Food Insecurity: Not on file  Transportation Needs: Not on file  Physical Activity: Not on file  Stress: Not on file  Social Connections: Not on file  Intimate Partner Violence: Not on file    FAMILY HISTORY: Family History  Problem Relation Age of Onset   Cancer Mother        uterine cancer    ALLERGIES:  is allergic to demerol [meperidine] and septra [sulfamethoxazole-trimethoprim].  MEDICATIONS:  Current Outpatient Medications  Medication Sig Dispense Refill   amitriptyline (ELAVIL) 10 MG tablet Take 20 mg by mouth at bedtime.     amLODipine (NORVASC) 5 MG tablet Take 5 mg by mouth daily.     feeding supplement, ENSURE ENLIVE, (ENSURE ENLIVE) LIQD Take 237 mLs by mouth 2 (two) times daily between meals. 237 mL 12   HYDROcodone-acetaminophen (NORCO/VICODIN) 5-325 MG tablet Take 1-2 tablets by mouth every 4 (four) hours as needed for moderate pain (pain score 4-6). 30 tablet 0   Multiple Vitamin (MULTIVITAMIN WITH MINERALS) TABS tablet Take 1 tablet by mouth daily. 30 tablet 0   bisacodyl (DULCOLAX) 10 MG suppository Place 1 suppository (10 mg total) rectally daily as needed for moderate constipation. (Patient not taking: No sig reported) 12 suppository 0   docusate sodium (COLACE) 100 MG capsule Take 1 capsule (100 mg total) by mouth 2 (two) times daily. (Patient not taking: No sig reported) 10 capsule 0   fluticasone (FLONASE) 50 MCG/ACT  nasal spray Place into the nose. (Patient not taking: No sig reported)     No current facility-administered medications for this visit.      Marland Kitchen  PHYSICAL EXAMINATION: ECOG PERFORMANCE STATUS: 0 - Asymptomatic  Vitals:   08/29/21 1455  BP: (!) 153/67  Pulse: 72  Resp: 18  Temp: 98.9 F (37.2 C)   Filed Weights   08/29/21 1455  Weight: 110 lb (49.9 kg)   Increasing bilateral neck adenopathy; underarm adenopathy.  Physical Exam HENT:     Head: Normocephalic and atraumatic.     Mouth/Throat:     Pharynx: No oropharyngeal exudate.  Eyes:     Pupils: Pupils are equal, round, and reactive to light.  Cardiovascular:     Rate and Rhythm: Normal rate and regular rhythm.  Pulmonary:     Effort: Pulmonary effort is normal. No respiratory distress.     Breath sounds: Normal breath sounds. No wheezing.  Abdominal:     General: Bowel sounds are normal. There is no distension.     Palpations: Abdomen is soft. There is no mass.     Tenderness: There is no abdominal tenderness. There is no guarding or rebound.  Musculoskeletal:        General: No tenderness. Normal range of  motion.     Cervical back: Normal range of motion and neck supple.  Skin:    General: Skin is warm.     Comments: Chronic multiple bruises noted.  Neurological:     Mental Status: She is alert and oriented to person, place, and time.  Psychiatric:        Mood and Affect: Affect normal.     LABORATORY DATA:  I have reviewed the data as listed Lab Results  Component Value Date   WBC 13.5 (H) 08/29/2021   HGB 10.1 (L) 08/29/2021   HCT 32.6 (L) 08/29/2021   MCV 98.5 08/29/2021   PLT 185 08/29/2021   Recent Labs    07/04/21 1431 07/27/21 1023 08/29/21 1429  NA 137 138 138  K 3.6 4.2 4.0  CL 105 106 105  CO2 '22 27 26  '$ GLUCOSE 131* 103* 130*  BUN 26* 23 19  CREATININE 0.82 0.91 0.84  CALCIUM 8.6* 8.8* 8.8*  GFRNONAA >60 >60 >60  PROT 6.5 6.3* 6.4*  ALBUMIN 4.0 4.0 4.2  AST '21 19 21  '$ ALT '14 10  13  '$ ALKPHOS 95 95 85  BILITOT 0.4 0.6 0.5    RADIOGRAPHIC STUDIES: I have personally reviewed the radiological images as listed and agreed with the findings in the report. No results found.  ASSESSMENT & PLAN:   CLL (chronic lymphocytic leukemia) ([place]) # CLL: [56% OF NUCLEI POSITIVE FOR A 13Q DELETION]; April 2022-PET scan shows significant lymphadenopathy above and below diaphragm; splenomegaly.  Most recently on acalabrutinib 100 mg once a day [since first of June 2022].   #Discontinue acalabrutinib because of continued side effects/progressive CLL.  # #  Discussed mechanism of action of rituximab;and also potential side effects including but not limited to infusion reactions;rare infections/activation including hepatitis.  Check hepatitis work-up at baseline.   #Bilateral lower extremity cramping-resolved after stopping acalabrutinib.  #Anemia mild hemoglobin nadir 10.2 [May 2022]-iron saturation 9%; iron rich foods.   # Elevated blood pressure/hypertensive emergency [sec to ibrutinib]- well controlled-no major hypertensive issues-stable.  #After lengthy discussion patient/daughter states that they will reach back to me regarding a preference.  # DISPOSITION: # follow up TBD-Dr.B  All questions were answered. The patient knows to call the clinic with any problems, questions or concerns.    Cammie Sickle, MD 09/14/2021 5:38 PM

## 2021-08-29 NOTE — Assessment & Plan Note (Addendum)
#   CLL: [56% OF NUCLEI POSITIVE FOR A 13Q DELETION]; April 2022-PET scan shows significant lymphadenopathy above and below diaphragm; splenomegaly.  Most recently on acalabrutinib 100 mg once a day [since first of June 2022].   #Discontinue acalabrutinib because of continued side effects/progressive CLL.  # #  Discussed mechanism of action of rituximab;and also potential side effects including but not limited to infusion reactions;rare infections/activation including hepatitis.  Check hepatitis work-up at baseline.   #Bilateral lower extremity cramping-resolved after stopping acalabrutinib.  #Anemia mild hemoglobin nadir 10.2 [May 2022]-iron saturation 9%; iron rich foods.   # Elevated blood pressure/hypertensive emergency [sec to ibrutinib]- well controlled-no major hypertensive issues-stable.  #After lengthy discussion patient/daughter states that they will reach back to me regarding a preference.  # DISPOSITION: # follow up TBD-Dr.B

## 2021-08-29 NOTE — Progress Notes (Signed)
Per Dr. Jacinto Reap- pt's daughter will call our office back after they think about the options for rituxan.

## 2021-08-30 ENCOUNTER — Other Ambulatory Visit (HOSPITAL_COMMUNITY): Payer: Self-pay

## 2021-09-05 ENCOUNTER — Other Ambulatory Visit: Payer: Medicare Other

## 2021-09-05 ENCOUNTER — Ambulatory Visit: Payer: Medicare Other

## 2021-09-05 ENCOUNTER — Ambulatory Visit: Payer: Medicare Other | Admitting: Internal Medicine

## 2021-09-14 ENCOUNTER — Encounter: Payer: Self-pay | Admitting: Internal Medicine

## 2021-09-14 ENCOUNTER — Telehealth: Payer: Self-pay | Admitting: Internal Medicine

## 2021-09-14 NOTE — Telephone Encounter (Signed)
Heather-please check with patient's daughter-if patient interested in pursuing rituximab as discussed at last visit.  There was supposed to call me back with their decision.  If patient interested-she will need hepatitis work-up/labs 1 week prior [HBsAg; hepatitis core antibody; HCV antibodies].  Thanks GB

## 2021-09-15 NOTE — Telephone Encounter (Signed)
Left detailed vm and requesting a call back to our office.

## 2021-09-19 NOTE — Telephone Encounter (Signed)
Left detailed vm at 1127 - requested call back or response to mychart msg sent last week.

## 2021-09-21 NOTE — Telephone Encounter (Signed)
1529 on 09/21/21- left detailed vm for Tracie to return my phone call.

## 2021-09-23 NOTE — Telephone Encounter (Signed)
Spoke with daughter. She will call our office back on Monday with patient's decision on follow-up and any treatment. Patient still remains undecided.

## 2021-12-15 ENCOUNTER — Encounter: Payer: Self-pay | Admitting: Internal Medicine

## 2021-12-16 ENCOUNTER — Other Ambulatory Visit: Payer: Self-pay | Admitting: Internal Medicine

## 2021-12-16 DIAGNOSIS — C9110 Chronic lymphocytic leukemia of B-cell type not having achieved remission: Secondary | ICD-10-CM

## 2021-12-16 NOTE — Progress Notes (Signed)
Rituxan will be *new*

## 2021-12-16 NOTE — Telephone Encounter (Signed)
Please see progress note from Dr. Jacinto Reap on 12/16/21

## 2021-12-16 NOTE — Progress Notes (Signed)
Please have patient follow-up in 1 week-labs CBC CMP/LDH; hepatitis panel - ordered. Labs in 1 week.   C-schedule follow up in week of 16th- MD; labs- cbc/cmp;Rituximab IV.   Thanks GB

## 2021-12-16 NOTE — Progress Notes (Signed)
START ON PATHWAY REGIMEN - Lymphoma and CLL     Ramp-up cycle = 35 days:     Venetoclax      Venetoclax      Venetoclax      Venetoclax      Venetoclax    Cycle 1 through 24 = every 28 days:     Venetoclax      Rituximab-xxxx      Rituximab-xxxx   **Always confirm dose/schedule in your pharmacy ordering system**  Patient Characteristics: Chronic Lymphocytic Leukemia (CLL), Treatment Indicated, Second Line Disease Type: Chronic Lymphocytic Leukemia (CLL) Disease Type: Not Applicable Disease Type: Not Applicable Treatment Indicated<= Treatment Indicated Line of Therapy: Second Line Intent of Therapy: Non-Curative / Palliative Intent, Discussed with Patient

## 2021-12-22 ENCOUNTER — Telehealth: Payer: Self-pay | Admitting: Internal Medicine

## 2021-12-22 NOTE — Telephone Encounter (Signed)
Colette, please hold off on cancelling appt at this time.  I have left a message with patient's daughter requesting additional information on any specific questions she may have.

## 2021-12-22 NOTE — Telephone Encounter (Signed)
Daughter called to cancel 1-6 appt and infusion on 1-16. Wants to tal to MD more about infusion.call back at [phone]

## 2021-12-23 ENCOUNTER — Inpatient Hospital Stay: Payer: Medicare Other

## 2021-12-23 NOTE — Telephone Encounter (Signed)
Spoke with Patient's daughter, Lindsay [name] [phone].  Lindsay [name] is still unsure if she wants to pursue Rituxan infusion and would like to talk with Dr. B about possible side effects before making her final decision.  They are requesting Dr. B to call patient next Tuesday or Wednesday to help her make a decision.    Lindsay [name], they do want to cancel lab appt today due to scheduling conflict and cancel the 6/44 appts because they will be out of town.  They will decide on rescheduling after speaking with Lindsay [name].

## 2021-12-27 ENCOUNTER — Encounter: Payer: Self-pay | Admitting: Internal Medicine

## 2021-12-27 NOTE — Progress Notes (Signed)
Return the Daughters phone call regarding questions for treatment option. Unable to reach the daughter.  Left a message to call us back To discuss further.

## 2021-12-28 NOTE — Telephone Encounter (Signed)
Dr. B left message for patient's daughter to return his call.

## 2021-12-28 NOTE — Progress Notes (Signed)
Left a message for the daughter Jacky Kindle  phone call regarding questions for patients treatments. Recommend call us back to discuss further.

## 2022-01-02 ENCOUNTER — Other Ambulatory Visit: Payer: Medicare Other

## 2022-01-02 ENCOUNTER — Ambulatory Visit: Payer: Medicare Other

## 2022-01-02 ENCOUNTER — Ambulatory Visit: Payer: Medicare Other | Admitting: Internal Medicine

## 2022-02-15 ENCOUNTER — Ambulatory Visit: Payer: Medicare Other | Admitting: Podiatry

## 2022-05-15 ENCOUNTER — Observation Stay
Admission: EM | Admit: 2022-05-15 | Discharge: 2022-05-16 | Disposition: A | Discharge status: Home / Self Care | Payer: Medicare Other | Attending: Obstetrics and Gynecology | Admitting: Obstetrics and Gynecology

## 2022-05-15 ENCOUNTER — Encounter: Payer: Self-pay | Admitting: Radiology

## 2022-05-15 ENCOUNTER — Emergency Department: Payer: Medicare Other

## 2022-05-15 DIAGNOSIS — I509 Heart failure, unspecified: Principal | ICD-10-CM

## 2022-05-15 DIAGNOSIS — I10 Essential (primary) hypertension: Secondary | ICD-10-CM | POA: Diagnosis not present

## 2022-05-15 DIAGNOSIS — D72820 Lymphocytosis (symptomatic): Secondary | ICD-10-CM

## 2022-05-15 DIAGNOSIS — N390 Urinary tract infection, site not specified: Secondary | ICD-10-CM

## 2022-05-15 DIAGNOSIS — D72829 Elevated white blood cell count, unspecified: Secondary | ICD-10-CM | POA: Insufficient documentation

## 2022-05-15 DIAGNOSIS — R0602 Shortness of breath: Secondary | ICD-10-CM | POA: Diagnosis present

## 2022-05-15 DIAGNOSIS — M797 Fibromyalgia: Secondary | ICD-10-CM | POA: Diagnosis present

## 2022-05-15 DIAGNOSIS — I447 Left bundle-branch block, unspecified: Secondary | ICD-10-CM | POA: Diagnosis present

## 2022-05-15 DIAGNOSIS — R778 Other specified abnormalities of plasma proteins: Secondary | ICD-10-CM

## 2022-05-15 DIAGNOSIS — J988 Other specified respiratory disorders: Secondary | ICD-10-CM

## 2022-05-15 DIAGNOSIS — R7989 Other specified abnormal findings of blood chemistry: Secondary | ICD-10-CM

## 2022-05-15 DIAGNOSIS — D649 Anemia, unspecified: Secondary | ICD-10-CM

## 2022-05-15 DIAGNOSIS — Z20822 Contact with and (suspected) exposure to covid-19: Secondary | ICD-10-CM | POA: Diagnosis not present

## 2022-05-15 DIAGNOSIS — C9110 Chronic lymphocytic leukemia of B-cell type not having achieved remission: Secondary | ICD-10-CM | POA: Diagnosis present

## 2022-05-15 LAB — CBC WITH DIFFERENTIAL/PLATELET
Abs Immature Granulocytes: 0.06 10*3/uL (ref 0.00–0.07)
Basophils Absolute: 0.1 10*3/uL (ref 0.0–0.1)
Basophils Relative: 0 %
Eosinophils Absolute: 0.6 10*3/uL — ABNORMAL HIGH (ref 0.0–0.5)
Eosinophils Relative: 1 %
HCT: 26.8 % — ABNORMAL LOW (ref 36.0–46.0)
Hemoglobin: 8.3 g/dL — ABNORMAL LOW (ref 12.0–15.0)
Immature Granulocytes: 0 %
Lymphocytes Relative: 88 %
Lymphs Abs: 35.6 10*3/uL — ABNORMAL HIGH (ref 0.7–4.0)
MCH: 32.9 pg (ref 26.0–34.0)
MCHC: 31 g/dL (ref 30.0–36.0)
MCV: 106.3 fL — ABNORMAL HIGH (ref 80.0–100.0)
Monocytes Absolute: 0.9 10*3/uL (ref 0.1–1.0)
Monocytes Relative: 2 %
Neutro Abs: 3.5 10*3/uL (ref 1.7–7.7)
Neutrophils Relative %: 9 %
Platelets: 180 10*3/uL (ref 150–400)
RBC: 2.52 MIL/uL — ABNORMAL LOW (ref 3.87–5.11)
RDW: 15.6 % — ABNORMAL HIGH (ref 11.5–15.5)
WBC: 40.7 10*3/uL — ABNORMAL HIGH (ref 4.0–10.5)
nRBC: 0 % (ref 0.0–0.2)

## 2022-05-15 LAB — TROPONIN I (HIGH SENSITIVITY): Troponin I (High Sensitivity): 35 ng/L — ABNORMAL HIGH (ref <18)

## 2022-05-15 LAB — COMPREHENSIVE METABOLIC PANEL
ALT: 13 U/L (ref 0–44)
AST: 29 U/L (ref 15–41)
Albumin: 3.8 g/dL (ref 3.5–5.0)
Alkaline Phosphatase: 89 U/L (ref 38–126)
Anion gap: 9 (ref 5–15)
BUN: 23 mg/dL (ref 8–23)
CO2: 22 mmol/L (ref 22–32)
Calcium: 8.7 mg/dL — ABNORMAL LOW (ref 8.9–10.3)
Chloride: 109 mmol/L (ref 98–111)
Creatinine, Ser: 0.94 mg/dL (ref 0.44–1.00)
GFR, Estimated: 59 mL/min — ABNORMAL LOW (ref >60)
Glucose, Bld: 133 mg/dL — ABNORMAL HIGH (ref 70–99)
Potassium: 4.2 mmol/L (ref 3.5–5.1)
Sodium: 140 mmol/L (ref 135–145)
Total Bilirubin: 0.7 mg/dL (ref 0.3–1.2)
Total Protein: 6.2 g/dL — ABNORMAL LOW (ref 6.5–8.1)

## 2022-05-15 LAB — RESP PANEL BY RT-PCR (FLU A&B, COVID) ARPGX2
Influenza A by PCR: NEGATIVE
Influenza B by PCR: NEGATIVE
SARS Coronavirus 2 by RT PCR: NEGATIVE

## 2022-05-15 LAB — BRAIN NATRIURETIC PEPTIDE: B Natriuretic Peptide: 456.6 pg/mL — ABNORMAL HIGH (ref 0.0–100.0)

## 2022-05-15 MED ORDER — ALBUTEROL SULFATE (2.5 MG/3ML) 0.083% IN NEBU: 2.5000 mg | INHALATION_SOLUTION | RESPIRATORY_TRACT | Status: DC | PRN

## 2022-05-15 MED ORDER — HYDROCODONE-ACETAMINOPHEN 5-325 MG PO TABS
1.0000 | ORAL_TABLET | ORAL | Status: DC | PRN
  Administered 2022-05-16: 2 via ORAL
  Filled 2022-05-15: qty 2

## 2022-05-15 MED ORDER — SODIUM CHLORIDE 0.9 % IV SOLN
500.0000 mg | INTRAVENOUS | Status: DC
  Administered 2022-05-16: 500 mg via INTRAVENOUS
  Filled 2022-05-15: qty 5

## 2022-05-15 MED ORDER — FUROSEMIDE 10 MG/ML IJ SOLN
20.0000 mg | Freq: Two times a day (BID) | INTRAMUSCULAR | Status: DC
  Administered 2022-05-16: 20 mg via INTRAVENOUS
  Filled 2022-05-15: qty 4

## 2022-05-15 MED ORDER — IOHEXOL 350 MG/ML SOLN
75.0000 mL | Freq: Once | INTRAVENOUS | Status: AC | PRN
  Administered 2022-05-15: 75 mL via INTRAVENOUS

## 2022-05-15 MED ORDER — ACETAMINOPHEN 325 MG PO TABS: 650.0000 mg | ORAL_TABLET | Freq: Four times a day (QID) | ORAL | Status: DC | PRN

## 2022-05-15 MED ORDER — AMLODIPINE BESYLATE 5 MG PO TABS
5.0000 mg | ORAL_TABLET | Freq: Every day | ORAL | Status: DC
  Administered 2022-05-16: 5 mg via ORAL

## 2022-05-15 MED ORDER — ENOXAPARIN SODIUM 40 MG/0.4ML IJ SOSY
40.0000 mg | PREFILLED_SYRINGE | INTRAMUSCULAR | Status: DC
  Administered 2022-05-16: 40 mg via SUBCUTANEOUS
  Filled 2022-05-15: qty 0.4

## 2022-05-15 MED ORDER — GUAIFENESIN-DM 100-10 MG/5ML PO SYRP: 5.0000 mL | ORAL_SOLUTION | ORAL | Status: DC | PRN

## 2022-05-15 MED ORDER — FUROSEMIDE 10 MG/ML IJ SOLN
20.0000 mg | Freq: Two times a day (BID) | INTRAMUSCULAR | Status: DC
Start: 2022-05-15 — End: 2022-05-15

## 2022-05-15 MED ORDER — ACETAMINOPHEN 325 MG RE SUPP: 650.0000 mg | Freq: Four times a day (QID) | RECTAL | Status: DC | PRN

## 2022-05-15 MED ORDER — ONDANSETRON HCL 4 MG/2ML IJ SOLN: 4.0000 mg | Freq: Four times a day (QID) | INTRAMUSCULAR | Status: DC | PRN

## 2022-05-15 MED ORDER — AMITRIPTYLINE HCL 10 MG PO TABS
20.0000 mg | ORAL_TABLET | Freq: Every day | ORAL | Status: DC
  Administered 2022-05-16: 20 mg via ORAL
  Filled 2022-05-15: qty 2

## 2022-05-15 MED ORDER — ONDANSETRON HCL 4 MG PO TABS: 4.0000 mg | ORAL_TABLET | Freq: Four times a day (QID) | ORAL | Status: DC | PRN

## 2022-05-15 MED ORDER — FUROSEMIDE 10 MG/ML IJ SOLN
40.0000 mg | Freq: Once | INTRAMUSCULAR | Status: AC
Start: 2022-05-15 — End: 2022-05-15
  Administered 2022-05-15: 40 mg via INTRAVENOUS
  Filled 2022-05-15: qty 4

## 2022-05-15 MED ORDER — SODIUM CHLORIDE 0.9 % IV SOLN
2.0000 g | INTRAVENOUS | Status: DC
  Administered 2022-05-16: 2 g via INTRAVENOUS
  Filled 2022-05-15: qty 20

## 2022-05-15 NOTE — Assessment & Plan Note (Addendum)
Nephrolithiasis s/p lithotripsy 2020 Patient was treated for Klebsiella UTI in January and E. coli in April Follow UA to evaluate for UTI Patient is followed by urologist, Dr. Yves Dill

## 2022-05-15 NOTE — ED Provider Notes (Signed)
[facility] Provider Note    Event Date/Time   First MD Initiated Contact with Patient 05/15/22 2006     (approximate)   History   Shortness of Breath (C/o increasing SOB since this morning, pt does not use oxygen, 95% on RA put on 2L for comfort, dx with leukemia 2 years ago.)   HPI  Lindsay [name] is a 88 y.o. female  with pmh CLL not on treatment who presents with dyspnea.  Patient notes that ever since her diagnosis of CLL she has had a lot of mucus production.  Has increased over the last 4 days and she feels more short of breath.  She has had green sputum did have a fever of over 100 several days ago but not more recently.  Feeling dyspneic denies chest pain no nausea vomiting abdominal pain or urinary symptoms.  No sick contacts.     Past Medical History:  Diagnosis Date   Arthritis    RHEUMATOID   Edema    MILD ANKLE OCCAS   Fibromyalgia    GERD (gastroesophageal reflux disease)    Hypertension    Kidney stones    RLS (restless legs syndrome)     Patient Active Problem List   Diagnosis Date Noted   Iron deficiency 07/04/2021   CLL (chronic lymphocytic leukemia) ([place]) 02/14/2021   HTN (hypertension) 04/19/2020   Accidental fall 04/19/2020   Fracture of femoral neck, left, closed ([place]) 04/19/2020   Preoperative clearance 04/19/2020   Closed left hip fracture, initial encounter ([place]) 04/19/2020   Left bundle branch block 04/19/2020   Leukemia ([place]) 04/19/2020   Arthritis 03/10/2020   Calculus of kidney 03/10/2020   Diverticulosis 03/10/2020   Fibromyalgia 03/10/2020   IBS (irritable bowel syndrome) 03/10/2020     Physical Exam  Triage Vital Signs: ED Triage Vitals  Enc Vitals Group     BP 05/15/22 2010 (!) 155/46     Pulse Rate 05/15/22 2010 89     Resp 05/15/22 2010 (!) 22     Temp 05/15/22 2010 99 F (37.2 C)     Temp Source 05/15/22 2010 Oral     SpO2 05/15/22 2010 95 %     Weight 05/15/22 2013 125 lb (56.7 kg)      Height 05/15/22 2013 '5\' 2"'$  (1.575 m)     Head Circumference --      Peak Flow --      Pain Score 05/15/22 2013 0     Pain Loc --      Pain Edu? --      Excl. in [place]? --     Most recent vital signs: Vitals:   05/15/22 2015 05/15/22 2100  BP: (!) 155/46 (!) 160/52  Pulse: 89 88  Resp: (!) 24 (!) 27  Temp:    SpO2: 93% 95%     General: Awake, no distress.  CV:  Good peripheral perfusion.  Minimal pitting edema bilateral lower extremities Resp:  Normal effort.  Crackles at bilateral base Abd:  No distention.  Neuro:             Awake, Alert, Oriented x 3  Other:     ED Results / Procedures / Treatments  Labs (all labs ordered are listed, but only abnormal results are displayed) Labs Reviewed  COMPREHENSIVE METABOLIC PANEL - Abnormal; Notable for the following components:      Result Value   Glucose, Bld 133 (*)    Calcium 8.7 (*)  Total Protein 6.2 (*)    GFR, Estimated 59 (*)    All other components within normal limits  CBC WITH DIFFERENTIAL/PLATELET - Abnormal; Notable for the following components:   WBC 40.7 (*)    RBC 2.52 (*)    Hemoglobin 8.3 (*)    HCT 26.8 (*)    MCV 106.3 (*)    RDW 15.6 (*)    Lymphs Abs 35.6 (*)    Eosinophils Absolute 0.6 (*)    All other components within normal limits  BRAIN NATRIURETIC PEPTIDE - Abnormal; Notable for the following components:   B Natriuretic Peptide 456.6 (*)    All other components within normal limits  TROPONIN I (HIGH SENSITIVITY) - Abnormal; Notable for the following components:   Troponin I (High Sensitivity) 35 (*)    All other components within normal limits  RESP PANEL BY RT-PCR (FLU A&B, COVID) ARPGX2  PROCALCITONIN  TROPONIN I (HIGH SENSITIVITY)     EKG  ECG reviewed interpreted by myself shows normal sinus rhythm with a left bundle branch block no Sgarbossa criteria to suggest ischemia   RADIOLOGY This x-ray interpreted by myself shows patchy opacities bilaterally consistent with either  interstitial pneumonia versus edema   PROCEDURES:  Critical Care performed: No  .1-3 Lead EKG Interpretation Performed by: Rada Hay, MD Authorized by: Rada Hay, MD     Interpretation: normal     ECG rate assessment: normal     Ectopy: none     Conduction: normal    The patient is on the cardiac monitor to evaluate for evidence of arrhythmia and/or significant heart rate changes.   MEDICATIONS ORDERED IN ED: Medications  furosemide (LASIX) injection 40 mg (has no administration in time range)  iohexol (OMNIPAQUE) 350 MG/ML injection 75 mL (75 mLs Intravenous Contrast Given 05/15/22 2143)     IMPRESSION / MDM / ASSESSMENT AND PLAN / ED COURSE  I reviewed the triage vital signs and the nursing notes.                              Differential diagnosis includes, but is not limited to, pneumonia, bronchitis, CHF, pulmonary embolism  Is a 88 year old female past medical history of CLL presents with several days of increasing sputum production and dyspnea.  No prior diagnosis of COPD or any lung disease.  Mucus is green did have temp at home several days ago of over 100 but nothing more recent.  No abdominal pain nausea vomiting.  Patient is mildly dyspneic satting around 94% on room air she does have crackles at bilateral bases just some minimal pitting edema in the lower extremities.  Differential is as above.  Will obtain chest x-ray labs.   Chest x-ray to me looks like pulmonary edema versus pneumonia.  She has a leukocytosis of 40 but this is chronic.  MP is 450 troponin mildly elevated at 35.  CTA obtained given history of cancer, PE this is negative Pleural effusions and interstitial edema.  We will send a Pro-Cal but my suspicion is that is new onset CHF.  We will start Lasix and given this is new when she is symptomatic with elevated respiratory rate will admit to the hospital service.  FINAL CLINICAL IMPRESSION(S) / ED DIAGNOSES   Final diagnoses:   Congestive heart failure, unspecified HF chronicity, unspecified heart failure type ([place])     Rx / DC Orders   ED Discharge Orders  None        Note:  This document was prepared using Dragon voice recognition software and may include unintentional dictation errors.   Rada Hay, MD 05/15/22 2216

## 2022-05-15 NOTE — Assessment & Plan Note (Signed)
Troponin of 35 but without complaints of chest pain and EKG is nonacute Suspect related to demand supply mismatch from shortness of breath Continue to trend

## 2022-05-15 NOTE — H&P (Signed)
History and Physical    Patient: Lindsay [name] XIP:[id number] DOB: 11/17/1934 DOA: 05/15/2022 DOS: the patient was seen and examined on 05/15/2022 PCP: [name], MD  Patient coming from: Home  Chief Complaint:  Chief Complaint  Patient presents with   Shortness of Breath    C/o increasing SOB since this morning, pt does not use oxygen, 95% on RA put on 2L for comfort, dx with leukemia 2 years ago.    HPI: Lindsay [name] is a 88 y.o. female with medical history significant for HTN, CLL diagnosed in early 2022, intolerant to several chemotherapeutic agents and currently off treatment since October 2022, nephrolithiasis s/p lithotripsy 2020 with recurrent UTI, (Klebsiella in January and E. coli on 03/21/2022) and followed by urologist, Dr. Elicia Lamp presents to the ED with a 4-day history of shortness of breath, fatigue, and a deep congested cough productive of green phlegm.  At baseline patient produces a lot of phlegm which is mostly yellow in color and sometimes green.  She  had a fever of about 100 a few days prior but has been afebrile since.  She denies chest pain.  Has no sick contacts.  Most of the history is provided by daughter at bedside who states that at baseline patient is very active and very functional however over the past 4 days she has been sitting around not doing much and gets short of breath just trying to take a shower.  On the day of arrival that shortness of breath made her feel like she was about to pass out. ED course and data review: Tmax 99, pulse 90, respirations 22-27 with BP 155/46 and O2 sat 95% on room air.  Labs with WBC 40,000 up from 13,000 in October 2022.  Hemoglobin 8.3 down from 10.  Troponin 35 and BNP 456.  Procalcitonin pending.  COVID and flu negative.  EKG, personally reviewed and interpreted showing sinus at 90 with an LBBB.  CT angio chest with no PE but showing the following: IMPRESSION: No evidence of pulmonary embolism.   Extensive thoracic  lymphadenopathy, similar to the prior PET, compatible with known CLL.   Small bilateral pleural effusions with suspected mild interstitial edema.  Patient treated with a dose of Lasix and hospitalist consulted for admission.   Review of Systems: As mentioned in the history of present illness. All other systems reviewed and are negative. Past Medical History:  Diagnosis Date   Arthritis    RHEUMATOID   Edema    MILD ANKLE OCCAS   Fibromyalgia    GERD (gastroesophageal reflux disease)    Hypertension    Kidney stones    RLS (restless legs syndrome)    Past Surgical History:  Procedure Laterality Date   ABDOMINAL HYSTERECTOMY     CATARACT EXTRACTION W/PHACO Left 02/07/2016   Procedure: CATARACT EXTRACTION PHACO AND INTRAOCULAR LENS PLACEMENT ([place]);  Surgeon: Estill Cotta, MD;  Location: [facility] ORS;  Service: Ophthalmology;  Laterality: Left;  [place] 01:33 AP% 24.7 CDE 39.57 fluid pack lot # [id number]   EXTRACORPOREAL SHOCK WAVE LITHOTRIPSY Right 07/03/2019   Procedure: EXTRACORPOREAL SHOCK WAVE LITHOTRIPSY (ESWL);  Surgeon: Royston Cowper, MD;  Location: [facility] ORS;  Service: Urology;  Laterality: Right;   EYE SURGERY     HIP ARTHROPLASTY Left 04/20/2020   Procedure: ARTHROPLASTY BIPOLAR HIP (HEMIARTHROPLASTY);  Surgeon: Hessie Knows, MD;  Location: [facility] ORS;  Service: Orthopedics;  Laterality: Left;   LITHOTRIPSY     TONSILLECTOMY     Social History:  reports  that she has never smoked. She has never used smokeless tobacco. She reports that she does not drink alcohol and does not use drugs.  Allergies  Allergen Reactions   Demerol [Meperidine]    Septra [Sulfamethoxazole-Trimethoprim]     Family History  Problem Relation Age of Onset   Cancer Mother        uterine cancer    Prior to Admission medications   Medication Sig Start Date End Date Taking? Authorizing Provider  amitriptyline (ELAVIL) 10 MG tablet Take 20 mg by mouth at bedtime.   Yes [provider]   amLODipine (NORVASC) 5 MG tablet Take 5 mg by mouth daily. 02/20/22  Yes [provider]  cyanocobalamin (,VITAMIN B-12,) 1000 MCG/ML injection Inject into the muscle. 07/04/21  Yes [provider]  feeding supplement, ENSURE ENLIVE, (ENSURE ENLIVE) LIQD Take 237 mLs by mouth 2 (two) times daily between meals. 04/22/20  Yes Swayze, Ava, DO  HYDROcodone-acetaminophen (NORCO/VICODIN) 5-325 MG tablet Take 1-2 tablets by mouth every 4 (four) hours as needed for moderate pain (pain score 4-6). 04/22/20  Yes Duanne Guess, PA-C  bisacodyl (DULCOLAX) 10 MG suppository Place 1 suppository (10 mg total) rectally daily as needed for moderate constipation. Patient not taking: Reported on 03/25/2021 04/22/20   Swayze, Ava, DO  docusate sodium (COLACE) 100 MG capsule Take 1 capsule (100 mg total) by mouth 2 (two) times daily. Patient not taking: Reported on 05/10/2021 04/22/20   Swayze, Ava, DO  fluticasone (FLONASE) 50 MCG/ACT nasal spray Place into the nose. Patient not taking: Reported on 05/10/2021 01/14/21 01/14/22  [provider]  Multiple Vitamin (MULTIVITAMIN WITH MINERALS) TABS tablet Take 1 tablet by mouth daily. Patient not taking: Reported on 05/15/2022 04/23/20   Lindsay Kirks, DO    Physical Exam: Vitals:   05/15/22 2010 05/15/22 2013 05/15/22 2015 05/15/22 2100  BP: (!) 155/46  (!) 155/46 (!) 160/52  Pulse: 89  89 88  Resp: (!) 22  (!) 24 (!) 27  Temp: 99 F (37.2 C)     TempSrc: Oral     SpO2: 95%  93% 95%  Weight:  56.7 kg    Height:  '5\' 2"'$  (1.575 m)     Physical Exam Vitals and nursing note reviewed.  Constitutional:      General: She is not in acute distress. HENT:     Head: Normocephalic and atraumatic.  Cardiovascular:     Rate and Rhythm: Normal rate and regular rhythm.     Heart sounds: Normal heart sounds.  Pulmonary:     Effort: Pulmonary effort is normal. Tachypnea present.     Breath sounds: Examination of the right-lower field reveals rales. Examination  of the left-lower field reveals rales. Rales present.  Abdominal:     Palpations: Abdomen is soft.     Tenderness: There is no abdominal tenderness.  Lymphadenopathy:     Cervical: Cervical adenopathy present.  Neurological:     Mental Status: Mental status is at baseline.     Data Reviewed: Relevant notes from primary care and specialist visits, past discharge summaries as available in EHR, including Care Everywhere. Prior diagnostic testing as pertinent to current admission diagnoses Updated medications and problem lists for reconciliation ED course, including vitals, labs, imaging, treatment and response to treatment Triage notes, nursing and pharmacy notes and ED provider's notes Notable results as noted in HPI   Assessment and Plan: * Acute CHF (congestive heart failure) ([place]) Patient presents with shortness of breath, tachypnea with  CTA chest negative for PE but showing small bilateral pleural effusions with suspected mild interstitial edema. Troponin 35 and BNP 456 Precipitating condition suspect respiratory tract infection or recent urinary tract infection but possibly related to progressive CLL Administered Lasix 40 mg in the ED Continue Lasix Daily weights with intake and output monitoring Echocardiogram in the a.m. BP control  Respiratory tract infection Given cough productive of greenish phlegm, low-grade temperature tachypnea we will treat empirically for pneumonia with Rocephin and azithromycin pending Pro-Calc Antitussives and supportive care, DuoNebs as needed Supplemental O2 if needed  Recurrent UTI Nephrolithiasis s/p lithotripsy 2020 Patient was treated for Klebsiella UTI in January and E. coli in April Follow UA to evaluate for UTI Patient is followed by urologist, Dr. Yves Dill  Elevated troponin Troponin of 35 but without complaints of chest pain and EKG is nonacute Suspect related to demand supply mismatch from shortness of breath Continue to  trend  Lymphocytosis WBC 40,000, up from 13,000 when last checked 8 months ago but lymphocytic predominant 88% Suspect all related to CLL CTA chest more consistent with interstitial edema without mention of infection Treating empirically for respiratory tract infections mentioned above  Anemia Hemoglobin 8.3, down from 10.1 Suspect related to progressive CLL Continue to monitor To consult oncology in the a.m.  CLL (chronic lymphocytic leukemia) ([place]) Patient is not currently on treatment.  She was intolerant to several agents and was last seen by oncologist in September 2022 Oncology consult in the a.m.  Hypertension Continue home amlodipine  Fibromyalgia Continue amitriptyline with hydrocodone as needed   Advance Care Planning:   Code Status: Prior   Consults: none  Family Communication: none  Severity of Illness: The appropriate patient status for this patient is INPATIENT. Inpatient status is judged to be reasonable and necessary in order to provide the required intensity of service to ensure the patient's safety. The patient's presenting symptoms, physical exam findings, and initial radiographic and laboratory data in the context of their chronic comorbidities is felt to place them at high risk for further clinical deterioration. Furthermore, it is not anticipated that the patient will be medically stable for discharge from the hospital within 2 midnights of admission.   * I certify that at the point of admission it is my clinical judgment that the patient will require inpatient hospital care spanning beyond 2 midnights from the point of admission due to high intensity of service, high risk for further deterioration and high frequency of surveillance required.*  Author: Athena Masse, MD 05/15/2022 11:15 PM  For on call review [email or web address].

## 2022-05-15 NOTE — Assessment & Plan Note (Signed)
Hemoglobin 8.3, down from 10.1 Suspect related to progressive CLL Continue to monitor To consult oncology in the a.m.

## 2022-05-15 NOTE — Assessment & Plan Note (Signed)
Continue amitriptyline with hydrocodone as needed

## 2022-05-15 NOTE — Assessment & Plan Note (Addendum)
Given cough productive of greenish phlegm, low-grade temperature tachypnea we will treat empirically for pneumonia with Rocephin and azithromycin pending Pro-Calc Antitussives and supportive care, DuoNebs as needed Supplemental O2 if needed

## 2022-05-15 NOTE — Assessment & Plan Note (Addendum)
WBC 40,000, up from 13,000 when last checked 8 months ago but lymphocytic predominant 88% Suspect all related to CLL CTA chest more consistent with interstitial edema without mention of infection Treating empirically for respiratory tract infections mentioned above

## 2022-05-15 NOTE — Assessment & Plan Note (Addendum)
Patient presents with shortness of breath, tachypnea with CTA chest negative for PE but showing small bilateral pleural effusions with suspected mild interstitial edema. Troponin 35 and BNP 456 Precipitating condition suspect respiratory tract infection or recent urinary tract infection but possibly related to progressive CLL Administered Lasix 40 mg in the ED Continue Lasix Daily weights with intake and output monitoring Echocardiogram in the a.m. BP control

## 2022-05-15 NOTE — Assessment & Plan Note (Signed)
-  Continue home amlodipine 

## 2022-05-15 NOTE — Assessment & Plan Note (Addendum)
Patient is not currently on treatment.  She was intolerant to several agents and was last seen by oncologist in September 2022 Oncology consult in the a.m.

## 2022-05-16 ENCOUNTER — Observation Stay: Admit: 2022-05-16 | Payer: Medicare Other

## 2022-05-16 DIAGNOSIS — N200 Calculus of kidney: Secondary | ICD-10-CM | POA: Insufficient documentation

## 2022-05-16 DIAGNOSIS — I509 Heart failure, unspecified: Secondary | ICD-10-CM

## 2022-05-16 LAB — CBC
HCT: 23.6 % — ABNORMAL LOW (ref 36.0–46.0)
Hemoglobin: 7.2 g/dL — ABNORMAL LOW (ref 12.0–15.0)
MCH: 32.7 pg (ref 26.0–34.0)
MCHC: 30.5 g/dL (ref 30.0–36.0)
MCV: 107.3 fL — ABNORMAL HIGH (ref 80.0–100.0)
Platelets: 155 10*3/uL (ref 150–400)
RBC: 2.2 MIL/uL — ABNORMAL LOW (ref 3.87–5.11)
RDW: 15.6 % — ABNORMAL HIGH (ref 11.5–15.5)
WBC: 31.1 10*3/uL — ABNORMAL HIGH (ref 4.0–10.5)
nRBC: 0 % (ref 0.0–0.2)

## 2022-05-16 LAB — URINALYSIS, COMPLETE (UACMP) WITH MICROSCOPIC
Bacteria, UA: NONE SEEN
Bilirubin Urine: NEGATIVE
Glucose, UA: NEGATIVE mg/dL
Hgb urine dipstick: NEGATIVE
Ketones, ur: NEGATIVE mg/dL
Leukocytes,Ua: NEGATIVE
Nitrite: NEGATIVE
Protein, ur: NEGATIVE mg/dL
Specific Gravity, Urine: 1.009 (ref 1.005–1.030)
Squamous Epithelial / HPF: NONE SEEN (ref 0–5)
WBC, UA: NONE SEEN WBC/hpf (ref 0–5)
pH: 5 (ref 5.0–8.0)

## 2022-05-16 LAB — BASIC METABOLIC PANEL
Anion gap: 10 (ref 5–15)
BUN: 20 mg/dL (ref 8–23)
CO2: 25 mmol/L (ref 22–32)
Calcium: 8.4 mg/dL — ABNORMAL LOW (ref 8.9–10.3)
Chloride: 108 mmol/L (ref 98–111)
Creatinine, Ser: 0.86 mg/dL (ref 0.44–1.00)
GFR, Estimated: >60 mL/min (ref >60)
Glucose, Bld: 100 mg/dL — ABNORMAL HIGH (ref 70–99)
Potassium: 3.8 mmol/L (ref 3.5–5.1)
Sodium: 143 mmol/L (ref 135–145)

## 2022-05-16 LAB — PROCALCITONIN: Procalcitonin: <0.1 ng/mL

## 2022-05-16 LAB — TROPONIN I (HIGH SENSITIVITY)
Troponin I (High Sensitivity): 96 ng/L — ABNORMAL HIGH (ref <18)
Troponin I (High Sensitivity): 98 ng/L — ABNORMAL HIGH (ref <18)

## 2022-05-16 MED ORDER — FUROSEMIDE 20 MG PO TABS: 20.0000 mg | ORAL_TABLET | Freq: Every day | ORAL | 11 refills | Status: DC

## 2022-05-16 NOTE — Care Management CC44 (Signed)
Condition Code 44 Documentation Completed  Patient Details  Name: Lindsay [name] MRN: [id number] Date of Birth: [birth year]   Condition Code 44 given:  Yes Patient signature on Condition Code 44 notice:  Yes Documentation of 2 MD's agreement:  Yes Code 44 added to claim:  Yes    Anselm Pancoast, RN 05/16/2022, 11:49 AM

## 2022-05-16 NOTE — Discharge Summary (Signed)
Lindsay [name] GUY:[id number] DOB: 1934-11-21 DOA: 05/15/2022  PCP: [name], MD  Admit date: 05/15/2022 Discharge date: 05/16/2022  Time spent: 45 minutes  Recommendations for Outpatient Follow-up:  Pcp f/u 1 week, will need BMP, assessment of cardiac and respiratory function Cardiology f/u (referred) Needs outpatient TTE     Discharge Diagnoses:  Principal Problem:   Acute CHF (congestive heart failure) ([place]) Active Problems:   Respiratory tract infection   Lymphocytosis   Elevated troponin   Recurrent UTI   CLL (chronic lymphocytic leukemia) ([place])   Anemia   Fibromyalgia   Hypertension   Left bundle branch block   CHF exacerbation ([place])   Discharge Condition: stable  Diet recommendation: heart healthy  Filed Weights   05/15/22 2013  Weight: 56.7 kg    History of present illness:  Lindsay [name] is a 88 y.o. female with medical history significant for HTN, CLL diagnosed in early 2022, intolerant to several chemotherapeutic agents and currently off treatment since October 2022, nephrolithiasis s/p lithotripsy 2020 with recurrent UTI, (Klebsiella in January and E. coli on 03/21/2022) and followed by urologist, Dr. Elicia Lamp presents to the ED with a 4-day history of shortness of breath, fatigue, and a deep congested cough productive of green phlegm.  At baseline patient produces a lot of phlegm which is mostly yellow in color and sometimes green.  She  had a fever of about 100 a few days prior but has been afebrile since.  She denies chest pain.  Has no sick contacts.  Most of the history is provided by daughter at bedside who states that at baseline patient is very active and very functional however over the past 4 days she has been sitting around not doing much and gets short of breath just trying to take a shower.  On the day of arrival that shortness of breath made her feel like she was about to pass out.  Hospital Course:  Patient presented with transient episode  of dyspnea the day of arrival. It resolved spontaneously. CTA shows no PE, extensive thoracic lymphadenopathy consistent with her known CLL, small pleural effusions with mild interstitial edema. BNP elevated to 400s. On exam patient says her respiratory symptoms have resolved. O2 wnl and breathing comfortably on room air. Ambulated well with nursing. Denies a cardiac history. Troponin mildly elevated to 90s and stable there. EKG with stable LBB. She denies chest pain - do not think this is ACS. This appears to be mild exacerbation of new CHF. We will start her on low-dose furosemide and she will need close f/u with PCP. We are also referring to cardiology. Unable to obtain inpatient echocardiogram, she will need this as an outpatient.   Procedures: none   Consultations: none  Discharge Exam: Vitals:   05/16/22 0630 05/16/22 1030  BP: (!) 132/43 (!) 130/58  Pulse: 66 77  Resp: 15 16  Temp:  98.1 F (36.7 C)  SpO2: 93% 96%    General: NAD Cardiovascular: RRR Respiratory: CTAB Ext: no edema  Discharge Instructions   Discharge Instructions     Ambulatory referral to Cardiology   Complete by: As directed    Diet - low sodium heart healthy   Complete by: As directed    Diet - low sodium heart healthy   Complete by: As directed    Increase activity slowly   Complete by: As directed    Increase activity slowly   Complete by: As directed       Allergies as of  05/16/2022       Reactions   Demerol [meperidine]    Septra [sulfamethoxazole-trimethoprim]         Medication List     TAKE these medications    amitriptyline 10 MG tablet Commonly known as: ELAVIL Take 20 mg by mouth at bedtime.   amLODipine 5 MG tablet Commonly known as: NORVASC Take 5 mg by mouth daily.   bisacodyl 10 MG suppository Commonly known as: DULCOLAX Place 1 suppository (10 mg total) rectally daily as needed for moderate constipation.   cyanocobalamin 1000 MCG/ML injection Commonly known  as: (VITAMIN B-12) Inject into the muscle.   docusate sodium 100 MG capsule Commonly known as: COLACE Take 1 capsule (100 mg total) by mouth 2 (two) times daily.   feeding supplement Liqd Take 237 mLs by mouth 2 (two) times daily between meals.   fluticasone 50 MCG/ACT nasal spray Commonly known as: FLONASE Place into the nose.   furosemide 20 MG tablet Commonly known as: Lasix Take 1 tablet (20 mg total) by mouth daily.   HYDROcodone-acetaminophen 5-325 MG tablet Commonly known as: NORCO/VICODIN Take 1-2 tablets by mouth every 4 (four) hours as needed for moderate pain (pain score 4-6).   multivitamin with minerals Tabs tablet Take 1 tablet by mouth daily.       Allergies  Allergen Reactions   Demerol [Meperidine]    Septra [Sulfamethoxazole-Trimethoprim]     Follow-up Information     [name], MD Follow up.   Specialty: Family Medicine Contact information: [address] [place] [address] [phone]                  The results of significant diagnostics from this hospitalization (including imaging, microbiology, ancillary and laboratory) are listed below for reference.    Significant Diagnostic Studies: CT Angio Chest PE W and/or Wo Contrast  Result Date: 05/15/2022 CLINICAL DATA:  Syncope.  Known lymphadenopathy (probable CLL). EXAM: CT ANGIOGRAPHY CHEST WITH CONTRAST TECHNIQUE: Multidetector CT imaging of the chest was performed using the standard protocol during bolus administration of intravenous contrast. Multiplanar CT image reconstructions and MIPs were obtained to evaluate the vascular anatomy. RADIATION DOSE REDUCTION: This exam was performed according to the departmental dose-optimization program which includes automated exposure control, adjustment of the mA and/or kV according to patient size and/or use of iterative reconstruction technique. CONTRAST:  41m OMNIPAQUE IOHEXOL 350 MG/ML SOLN COMPARISON:  Chest  radiograph dated 05/15/2022. PET-CT dated 03/22/2021. FINDINGS: Cardiovascular: Satisfactory opacification of the bilateral pulmonary arteries to the segmental level. No evidence of pulmonary embolism. Although not tailored for evaluation of the thoracic aorta, there is no evidence of thoracic aortic aneurysm or dissection. Atherosclerotic calcifications of the aortic arch. Heart is top-normal in size.  No pericardial effusion. Mild coronary atherosclerosis of the LAD. Mediastinum/Nodes: Extensive thoracic lymphadenopathy, similar to the prior. Index nodes include bilateral axillary nodes measuring 2.0 cm short axis on the right (series 4/image 35) and 1.7 cm short axis on the left (series 4/image 45). Bulky mediastinal nodes, measuring 17 mm in the right lower paratracheal region (series 4/image 47) and 19 mm in the subcarinal region (series 4/image 62). 16 mm short axis right hilar node (series 4/image 60). Visualized thyroid is unremarkable. Lungs/Pleura: Small bilateral pleural effusions. Scattered mild ground-glass opacity with faint interlobular septal thickening in the upper lobes, suggesting mild interstitial edema. No suspicious pulmonary nodules. No pleural effusion or pneumothorax. Upper Abdomen: Visualized upper abdomen is grossly unremarkable, noting suspected splenomegaly,  although incompletely visualized. Musculoskeletal: Visualized osseous structures are within normal limits. Review of the MIP images confirms the above findings. IMPRESSION: No evidence of pulmonary embolism. Extensive thoracic lymphadenopathy, similar to the prior PET, compatible with known CLL. Small bilateral pleural effusions with suspected mild interstitial edema. Aortic Atherosclerosis (ICD10-I70.0). Electronically Signed   By: Julian Hy M.D.   On: 05/15/2022 22:02   DG Chest Portable 1 View  Result Date: 05/15/2022 CLINICAL DATA:  Shortness of breath and fevers. EXAM: PORTABLE CHEST 1 VIEW COMPARISON:  PA Lat  08/10/2021. FINDINGS: There is mild cardiomegaly, increased perihilar vascular congestion and basilar interstitial edema, and small pleural effusions have formed. There is hazy interstitial consolidation in the lower lung fields which could be ground-glass edema, pneumonitis or combination. The mid and upper lung fields are clear with COPD change. The mediastinum is stable with mild aortic atherosclerosis. Mild thoracic dextroscoliosis and osteopenia. IMPRESSION: Cardiomegaly with mild perihilar vascular congestion and basilar interstitial edema. Findings consistent with CHF or fluid overload, with small pleural effusions. Hazy opacities in the lower lung fields consistent with ground-glass edema and/or pneumonitis. Clinical correlation and radiographic follow-up recommended. Electronically Signed   By: Telford Nab M.D.   On: 05/15/2022 20:33    Microbiology: Recent Results (from the past 240 hour(s))  Resp Panel by RT-PCR (Flu A&B, Covid) Anterior Nasal Swab     Status: None   Collection Time: 05/15/22  8:17 PM   Specimen: Anterior Nasal Swab  Result Value Ref Range Status   SARS Coronavirus 2 by RT PCR NEGATIVE NEGATIVE Final    Comment: (NOTE) SARS-CoV-2 target nucleic acids are NOT DETECTED.  The SARS-CoV-2 RNA is generally detectable in upper respiratory specimens during the acute phase of infection. The lowest concentration of SARS-CoV-2 viral copies this assay can detect is 138 copies/mL. A negative result does not preclude SARS-Cov-2 infection and should not be used as the sole basis for treatment or other patient management decisions. A negative result may occur with  improper specimen collection/handling, submission of specimen other than nasopharyngeal swab, presence of viral mutation(s) within the areas targeted by this assay, and inadequate number of viral copies(<138 copies/mL). A negative result must be combined with clinical observations, patient history, and  epidemiological information. The expected result is Negative.  Fact Sheet for Patients:  EntrepreneurPulse.com.au  Fact Sheet for Healthcare Providers:  IncredibleEmployment.be  This test is no t yet approved or cleared by the [place] FDA and  has been authorized for detection and/or diagnosis of SARS-CoV-2 by FDA under an Emergency Use Authorization (EUA). This EUA will remain  in effect (meaning this test can be used) for the duration of the COVID-19 declaration under Section 564(b)(1) of the Act, 21 U.S.C.section 360bbb-3(b)(1), unless the authorization is terminated  or revoked sooner.       Influenza A by PCR NEGATIVE NEGATIVE Final   Influenza B by PCR NEGATIVE NEGATIVE Final    Comment: (NOTE) The Xpert Xpress SARS-CoV-2/FLU/RSV plus assay is intended as an aid in the diagnosis of influenza from Nasopharyngeal swab specimens and should not be used as a sole basis for treatment. Nasal washings and aspirates are unacceptable for Xpert Xpress SARS-CoV-2/FLU/RSV testing.  Fact Sheet for Patients: EntrepreneurPulse.com.au  Fact Sheet for Healthcare Providers: IncredibleEmployment.be  This test is not yet approved or cleared by the [place] FDA and has been authorized for detection and/or diagnosis of SARS-CoV-2 by FDA under an Emergency Use Authorization (EUA). This EUA will remain in effect (meaning this  test can be used) for the duration of the COVID-19 declaration under Section 564(b)(1) of the Act, 21 U.S.C. section 360bbb-3(b)(1), unless the authorization is terminated or revoked.  Performed at [facility], [address] [place], [place] [address]   Culture, blood (routine x 2) Call MD if unable to obtain prior to antibiotics being given     Status: None (Preliminary result)   Collection Time: 05/16/22  1:34 AM   Specimen: BLOOD  Result Value Ref Range Status    Specimen Description BLOOD LEFT WRIST  Final   Special Requests   Final    BOTTLES DRAWN AEROBIC AND ANAEROBIC Blood Culture adequate volume   Culture   Final    NO GROWTH < 12 HOURS Performed at [facility] [address] [place], [place] [address]    Report Status PENDING  Incomplete  Culture, blood (routine x 2) Call MD if unable to obtain prior to antibiotics being given     Status: None (Preliminary result)   Collection Time: 05/16/22  1:34 AM   Specimen: BLOOD  Result Value Ref Range Status   Specimen Description BLOOD RIGHT FOREARM  Final   Special Requests   Final    BOTTLES DRAWN AEROBIC AND ANAEROBIC Blood Culture adequate volume   Culture   Final    NO GROWTH < 12 HOURS Performed at [facility], [address] [place], [place] [address]    Report Status PENDING  Incomplete     Labs: Basic Metabolic Panel: Recent Labs  Lab 05/15/22 2018 05/16/22 0653  NA 140 143  K 4.2 3.8  CL 109 108  CO2 22 25  GLUCOSE 133* 100*  BUN 23 20  CREATININE 0.94 0.86  CALCIUM 8.7* 8.4*   Liver Function Tests: Recent Labs  Lab 05/15/22 2018  AST 29  ALT 13  ALKPHOS 89  BILITOT 0.7  PROT 6.2*  ALBUMIN 3.8   No results for input(s): LIPASE, AMYLASE in the last 168 hours. No results for input(s): AMMONIA in the last 168 hours. CBC: Recent Labs  Lab 05/15/22 2018 05/16/22 0653  WBC 40.7* 31.1*  NEUTROABS 3.5  --   HGB 8.3* 7.2*  HCT 26.8* 23.6*  MCV 106.3* 107.3*  PLT 180 155   Cardiac Enzymes: No results for input(s): CKTOTAL, CKMB, CKMBINDEX, TROPONINI in the last 168 hours. BNP: BNP (last 3 results) Recent Labs    05/15/22 2017  BNP 456.6*    ProBNP (last 3 results) No results for input(s): PROBNP in the last 8760 hours.  CBG: No results for input(s): GLUCAP in the last 168 hours.     Signed:  Desma Maxim MD.  [facility] 05/16/2022, 11:22 AM

## 2022-05-16 NOTE — Consult Note (Signed)
   Heart Failure Nurse Navigator Note  HF-unknown as to whether it is preserved or reduced ejection, echocardiogram is pending.  She presented to the emergency room with a 4-day history of shortness of breath, fatigue and congested cough.  Chest x-ray revealed mild interstitial edema and small bilateral pleural effusions.  BNP was elevated at 456.  Comorbidities:  Hypertension CLL Rheumatoid arthritis Fibromyalgia GERD Left bundle branch block  Medications:  Furosemide 20 mg by mouth daily Amlodipine 5 mg daily  Labs:  Sodium 143, potassium 3.8, chloride 108, CO2 25, BUN 20, creatinine 0.86, hemoglobin 8.3, hematocrit 26.8, platelet count 180, troponins 35, 96 and 98.  Pro-Cal was less than 0.10 Weight is 56.7 kg Blood pressure 135/77  Initial meeting with patient in the emergency room, she is sitting fully dressed on the gurney ready to be discharged home.  She is in no acute distress.  Daughter that she lives with this at the bedside.  Discussed heart failure, she states yesterday was the first day she heard it in relationship to herself.  Discussed the different types of heart failure and explained beings that her echocardiogram was not completed could not classify her at this time.  She and her daughter voiced understanding.  She lives with her daughter.  She states that she has a scale.  Discussed daily weights and what to report along with recording it daily.  Went over the zone magnet.  Discussed the importance of sticking with less than 2000 mg daily of sodium.  She states that she does not eat fast food and is particular about the foods that she eats with the stomach troubles that she has.  Went over reading labels.  Also went over the importance of fluid restriction of 64 ounces or less in a days time And how that relates to taking water pill-lasix or furosemide.  They voice understanding.  She was given the living with heart failure teaching booklet, zone magnet,  info on low sodium, weight chart and heart failure.  They had no further questions.  Will be seen in the heart failure clinic on June 1 at 1:30 pm.  Otila Kluver made aware that echocardiogram was not completed.  Pricilla Riffle RN CHFN

## 2022-05-16 NOTE — Care Management Obs Status (Signed)
[facility] NOTIFICATION   Patient Details  Name: AMBERLE LYTER MRN: [id number] Date of Birth: 03/21/1934   Medicare Observation Status Notification Given:  Yes    Anselm Pancoast, RN 05/16/2022, 11:49 AM

## 2022-05-16 NOTE — TOC Initial Note (Signed)
Transition of Care [facility]) - Initial/Assessment Note    Patient Details  Name: Lindsay [name] MRN: [id number] Date of Birth: 1934-05-24  Transition of Care [facility]) CM/SW Contact:    Anselm Pancoast, RN Phone Number: 05/16/2022, 12:10 PM  Clinical Narrative:                 Met with patient and family at bedside to complete code 28. Patient with no needs related to placement. Discharging home with family support.         Patient Goals and CMS Choice        Expected Discharge Plan and Services           Expected Discharge Date: 05/16/22                                    Prior Living Arrangements/Services                       Activities of Daily Living      Permission Sought/Granted                  Emotional Assessment              Admission diagnosis:  Acute CHF (congestive heart failure) ([place]) [I50.9] CHF exacerbation ([place]) [I50.9] Patient Active Problem List   Diagnosis Date Noted   Kidney stone 05/16/2022   CHF exacerbation ([place]) 05/16/2022   Acute CHF (congestive heart failure) ([place]) 05/15/2022   Anemia    Lymphocytosis    Elevated troponin    Respiratory tract infection    Recurrent UTI    Iron deficiency 07/04/2021   CLL (chronic lymphocytic leukemia) ([place]) 02/14/2021   Hypertension 04/19/2020   Accidental fall 04/19/2020   Fracture of femoral neck, left, closed ([place]) 04/19/2020   Preoperative clearance 04/19/2020   Closed left hip fracture, initial encounter ([place]) 04/19/2020   Left bundle branch block 04/19/2020   Leukemia ([place]) 04/19/2020   Arthritis 03/10/2020   Calculus of kidney 03/10/2020   Diverticulosis 03/10/2020   Fibromyalgia 03/10/2020   IBS (irritable bowel syndrome) 03/10/2020   PCP:  [name], MD Pharmacy:   [facility] [place] [address] Phone: [phone] Fax: [place] [address] Phone: [phone] Fax: [phone]     Social Determinants of Health (SDOH) Interventions    Readmission Risk Interventions     View : No data to display.

## 2022-05-17 NOTE — Progress Notes (Unsigned)
   Patient ID: Lindsay [name], female    DOB: 1934-05-14, 88 y.o.   MRN: [id number]  HPI  Lindsay [name] is a 88 y/o female with a history of  No echo has been done.   Was in the ED 05/15/22 due to 4-day history of shortness of breath, fatigue, and a deep congested cough productive of green phlegm. CTA negative for PE. Given IV lasix with transition to oral diuretics. Released the next day with new onset HF, unable to obtain echo.   She presents today for her initial visit with a chief complaint of   Review of Systems    Physical Exam  Assessment & Plan:  1: Chronic heart failure with preserved ejection fraction- - NYHA class  - echo has been scheduled for - history of recurrent UTI so most likely will not be a candidate for SGLT2 - BNP 05/15/22 was 456.6  2: HTN- - BP - saw PCP Lindsay [name]) 02/20/22 - BMP 05/16/22 reviewed and showed sodium 143, potassium 3.8, creatinine 0.86 and GFR >60  3: CLL- - intolerant to several chemotherapeutic agents and currently off treatment since October 2022

## 2022-05-18 ENCOUNTER — Ambulatory Visit: Payer: Medicare Other | Attending: Family | Admitting: Family

## 2022-05-18 ENCOUNTER — Encounter: Payer: Self-pay | Admitting: Family

## 2022-05-18 VITALS — BP 156/43 | HR 72 | Resp 16 | Ht 62.0 in | Wt 110.0 lb

## 2022-05-18 DIAGNOSIS — N189 Chronic kidney disease, unspecified: Secondary | ICD-10-CM | POA: Diagnosis not present

## 2022-05-18 DIAGNOSIS — I10 Essential (primary) hypertension: Secondary | ICD-10-CM | POA: Diagnosis not present

## 2022-05-18 DIAGNOSIS — C9110 Chronic lymphocytic leukemia of B-cell type not having achieved remission: Secondary | ICD-10-CM | POA: Insufficient documentation

## 2022-05-18 DIAGNOSIS — I130 Hypertensive heart and chronic kidney disease with heart failure and stage 1 through stage 4 chronic kidney disease, or unspecified chronic kidney disease: Secondary | ICD-10-CM | POA: Diagnosis not present

## 2022-05-18 DIAGNOSIS — Z87440 Personal history of urinary (tract) infections: Secondary | ICD-10-CM | POA: Insufficient documentation

## 2022-05-18 DIAGNOSIS — Z79899 Other long term (current) drug therapy: Secondary | ICD-10-CM | POA: Insufficient documentation

## 2022-05-18 DIAGNOSIS — M797 Fibromyalgia: Secondary | ICD-10-CM | POA: Diagnosis not present

## 2022-05-18 DIAGNOSIS — K219 Gastro-esophageal reflux disease without esophagitis: Secondary | ICD-10-CM | POA: Insufficient documentation

## 2022-05-18 DIAGNOSIS — I5032 Chronic diastolic (congestive) heart failure: Secondary | ICD-10-CM | POA: Insufficient documentation

## 2022-05-18 NOTE — Progress Notes (Signed)
[facility] - PHARMACIST COUNSELING NOTE  Guideline-Directed Medical Therapy/Evidence Based Medicine  ACE/ARB/ARNI: None Beta Blocker: None Aldosterone Antagonist: None Diuretic: None SGLT2i: None  Adherence Assessment  Do you ever forget to take your medication? '[]'$ Yes '[x]'$ No  Do you ever skip doses due to side effects? '[]'$ Yes '[x]'$ No  Do you have trouble affording your medicines? '[]'$ Yes '[x]'$ No  Are you ever unable to pick up your medication due to transportation difficulties? '[]'$ Yes '[x]'$ No  Do you ever stop taking your medications because you don't believe they are helping? '[]'$ Yes '[x]'$ No  Do you check your weight daily? '[]'$ Yes '[x]'$ No   Adherence strategy: organizes pill bottles  Barriers to obtaining medications: n/a. Daughter assists patient in care and transportation.  Vital signs: HR 72, BP 156/43, weight (pounds) 110 lb ECHO: None to date     Latest Ref Rng & Units 05/16/2022    6:53 AM 05/15/2022    8:18 PM 08/29/2021    2:29 PM  BMP  Glucose 70 - 99 mg/dL 100   133   130    BUN 8 - 23 mg/dL '20   23   19    '$ Creatinine 0.44 - 1.00 mg/dL 0.86   0.94   0.84    Sodium 135 - 145 mmol/L 143   140   138    Potassium 3.5 - 5.1 mmol/L 3.8   4.2   4.0    Chloride 98 - 111 mmol/L 108   109   105    CO2 22 - 32 mmol/L '25   22   26    '$ Calcium 8.9 - 10.3 mg/dL 8.4   8.7   8.8      Past Medical History:  Diagnosis Date   Arthritis    RHEUMATOID   CHF (congestive heart failure) (HCC)    CLL (chronic lymphocytic leukemia) (HCC)    Edema    MILD ANKLE OCCAS   Fibromyalgia    GERD (gastroesophageal reflux disease)    Hypertension    Kidney stones    RLS (restless legs syndrome)     ASSESSMENT 88 year old female who presents to the HF clinic for initial visit. PMH relevant to HTN, CKD, CLL, fibromyalgia, GERD, RLS, and heart failure. Received 3 days of imburtinib in 03/2021, but was ultimately stopped due to hypertensive emergency.  Experienced a fall 2 years ago resulting in hip fracture. Noted low diastolic bp today in clinic.  Recent ED Visit (past 6 months): Date - 05/15/22, CC - new onset CHF  PLAN Reconciled medications. Suggest ECHO to determine appropriate GDMT. Consider d/c amlodipine if found to have reduced EF on ECHO. Suggest follow up labs  Time spent: 10 minutes  Wynelle Cleveland, Pharm.D. Clinical Pharmacist 05/18/2022 8:33 PM   Current Outpatient Medications:    amitriptyline (ELAVIL) 10 MG tablet, Take 20 mg by mouth at bedtime., Disp: , Rfl:    amLODipine (NORVASC) 5 MG tablet, Take 5 mg by mouth daily., Disp: , Rfl:    cyanocobalamin (,VITAMIN B-12,) 1000 MCG/ML injection, Inject into the muscle every 30 (thirty) days., Disp: , Rfl:    feeding supplement, ENSURE ENLIVE, (ENSURE ENLIVE) LIQD, Take 237 mLs by mouth 2 (two) times daily between meals., Disp: 237 mL, Rfl: 12   furosemide (LASIX) 20 MG tablet, Take 1 tablet (20 mg total) by mouth daily., Disp: 30 tablet, Rfl: 11   HYDROcodone-acetaminophen (NORCO/VICODIN) 5-325 MG tablet, Take 1-2 tablets by mouth every 4 (four)  hours as needed for moderate pain (pain score 4-6). (Patient taking differently: Take 1-2 tablets by mouth every 4 (four) hours as needed for moderate pain (pain score 4-6). Takes ~twice daily for arthritis), Disp: 30 tablet, Rfl: 0   Multiple Vitamin (MULTIVITAMIN WITH MINERALS) TABS tablet, Take 1 tablet by mouth daily., Disp: 30 tablet, Rfl: 0   COUNSELING POINTS/CLINICAL PEARLS  DRUGS TO CAUTION IN HEART FAILURE  Drug or Class Mechanism  Analgesics NSAIDs COX-2 inhibitors Glucocorticoids  Sodium and water retention, increased systemic vascular resistance, decreased response to diuretics   Diabetes Medications Metformin Thiazolidinediones Rosiglitazone (Avandia) Pioglitazone (Actos) DPP4 Inhibitors Saxagliptin (Onglyza) Sitagliptin (Januvia)   Lactic acidosis Possible calcium channel blockade   Unknown   Antiarrhythmics Class I  Flecainide Disopyramide Class III Sotalol Other Dronedarone  Negative inotrope, proarrhythmic   Proarrhythmic, beta blockade  Negative inotrope  Antihypertensives Alpha Blockers Doxazosin Calcium Channel Blockers Diltiazem Verapamil Nifedipine Central Alpha Adrenergics Moxonidine Peripheral Vasodilators Minoxidil  Increases renin and aldosterone  Negative inotrope    Possible sympathetic withdrawal  Unknown  Anti-infective Itraconazole Amphotericin B  Negative inotrope Unknown  Hematologic Anagrelide Cilostazol   Possible inhibition of PD IV Inhibition of PD III causing arrhythmias  Neurologic/Psychiatric Stimulants Anti-Seizure Drugs Carbamazepine Pregabalin Antidepressants Tricyclics Citalopram Parkinsons Bromocriptine Pergolide Pramipexole Antipsychotics Clozapine Antimigraine Ergotamine Methysergide Appetite suppressants Bipolar Lithium  Peripheral alpha and beta agonist activity  Negative inotrope and chronotrope Calcium channel blockade  Negative inotrope, proarrhythmic Dose-dependent QT prolongation  Excessive serotonin activity/valvular damage Excessive serotonin activity/valvular damage Unknown  IgE mediated hypersensitivy, calcium channel blockade  Excessive serotonin activity/valvular damage Excessive serotonin activity/valvular damage Valvular damage  Direct myofibrillar degeneration, adrenergic stimulation  Antimalarials Chloroquine Hydroxychloroquine Intracellular inhibition of lysosomal enzymes  Urologic Agents Alpha Blockers Doxazosin Prazosin Tamsulosin Terazosin  Increased renin and aldosterone  Adapted from Page Carleene Overlie, et al. "Drugs That May Cause or Exacerbate Heart Failure: A Scientific Statement from the American Heart  Association." Circulation 2016; 134:e32-e69. DOI: 10.1161/CIR.0000000000000426   MEDICATION ADHERENCES TIPS AND STRATEGIES Taking medication as prescribed  improves patient outcomes in heart failure (reduces hospitalizations, improves symptoms, increases survival) Side effects of medications can be managed by decreasing doses, switching agents, stopping drugs, or adding additional therapy. Please let someone in the [facility] know if you have having bothersome side effects so we can modify your regimen. Do not alter your medication regimen without talking to [place].  Medication reminders can help patients remember to take drugs on time. If you are missing or forgetting doses you can try linking behaviors, using pill boxes, or an electronic reminder like an alarm on your phone or an app. Some people can also get automated phone calls as medication reminders.

## 2022-05-18 NOTE — Patient Instructions (Signed)
Continue weighing daily and call for an overnight weight gain of 3 pounds or more or a weekly weight gain of more than 5 pounds.   If you have voicemail, please make sure your mailbox is cleaned out so that we may leave a message and please make sure to listen to any voicemails.     

## 2022-05-21 LAB — CULTURE, BLOOD (ROUTINE X 2)
Culture: NO GROWTH
Culture: NO GROWTH
Special Requests: ADEQUATE
Special Requests: ADEQUATE

## 2022-05-22 ENCOUNTER — Ambulatory Visit: Payer: Medicare Other | Admitting: Family

## 2022-05-29 NOTE — Progress Notes (Signed)
Patient ID: Lindsay [name], female    DOB: [birth year], 88 y.o.   MRN: [id number]  HPI  Ms Wattley is a 88 y/o female with a history of HTN, CKD, CLL, fibromyalgia, GERD, RLS and heart failure.   Echo was done earlier today but no results yet to review.   Was in the ED 05/15/22 due to 4-day history of shortness of breath, fatigue, and a deep congested cough productive of green phlegm. CTA negative for PE. Given IV lasix with transition to oral diuretics. Released the next day with new onset HF, unable to obtain echo.   She presents today for a follow-up visit with a chief complaint of minimal fatigue with moderate exertion. Describes this as chronic in nature. She has no other symptoms and specifically denies any difficulty sleeping, dizziness, abdominal distention, palpitations, pedal edema, chest pain, shortness of breath, cough or weight gain.   Is now on antibiotic from her PCP for possible bronchitis and taking her diuretic QOD. Goes to see cardiology next week.   Past Medical History:  Diagnosis Date   Arthritis    RHEUMATOID   CHF (congestive heart failure) (HCC)    CLL (chronic lymphocytic leukemia) (HCC)    Edema    MILD ANKLE OCCAS   Fibromyalgia    GERD (gastroesophageal reflux disease)    Hypertension    Kidney stones    RLS (restless legs syndrome)    Past Surgical History:  Procedure Laterality Date   ABDOMINAL HYSTERECTOMY     CATARACT EXTRACTION W/PHACO Left 02/07/2016   Procedure: CATARACT EXTRACTION PHACO AND INTRAOCULAR LENS PLACEMENT ([place]);  Surgeon: Estill Cotta, MD;  Location: [facility] ORS;  Service: Ophthalmology;  Laterality: Left;  [place] 01:33 AP% 24.7 CDE 39.57 fluid pack lot # [id number]   EXTRACORPOREAL SHOCK WAVE LITHOTRIPSY Right 07/03/2019   Procedure: EXTRACORPOREAL SHOCK WAVE LITHOTRIPSY (ESWL);  Surgeon: Royston Cowper, MD;  Location: [facility] ORS;  Service: Urology;  Laterality: Right;   EYE SURGERY     HIP ARTHROPLASTY Left 04/20/2020   Procedure:  ARTHROPLASTY BIPOLAR HIP (HEMIARTHROPLASTY);  Surgeon: Hessie Knows, MD;  Location: [facility] ORS;  Service: Orthopedics;  Laterality: Left;   LITHOTRIPSY     TONSILLECTOMY     Family History  Problem Relation Age of Onset   Cancer Mother        uterine cancer   Social History   Tobacco Use   Smoking status: Never   Smokeless tobacco: Never  Substance Use Topics   Alcohol use: No   Allergies  Allergen Reactions   Demerol [Meperidine]    Septra [Sulfamethoxazole-Trimethoprim]    Prior to Admission medications   Medication Sig Start Date End Date Taking? Authorizing Provider  amitriptyline (ELAVIL) 10 MG tablet Take 20 mg by mouth at bedtime.   Yes [provider]  amLODipine (NORVASC) 5 MG tablet Take 5 mg by mouth daily. 02/20/22  Yes [provider]  amoxicillin (AMOXIL) 875 MG tablet Take 875 mg by mouth 2 (two) times daily.   Yes [provider]  cyanocobalamin (,VITAMIN B-12,) 1000 MCG/ML injection Inject into the muscle every 30 (thirty) days. 07/04/21  Yes [provider]  feeding supplement, ENSURE ENLIVE, (ENSURE ENLIVE) LIQD Take 237 mLs by mouth 2 (two) times daily between meals. 04/22/20  Yes Swayze, Ava, DO  furosemide (LASIX) 20 MG tablet Take 1 tablet (20 mg total) by mouth daily. Patient taking differently: Take 20 mg by mouth every other day. 05/16/22 05/16/23 Yes Wouk, Ailene Rud,  MD  HYDROcodone-acetaminophen (NORCO/VICODIN) 5-325 MG tablet Take 1-2 tablets by mouth every 4 (four) hours as needed for moderate pain (pain score 4-6). Patient taking differently: Take 1-2 tablets by mouth every 4 (four) hours as needed for moderate pain (pain score 4-6). Takes ~twice daily for arthritis 04/22/20  Yes Duanne Guess, PA-C  Multiple Vitamin (MULTIVITAMIN WITH MINERALS) TABS tablet Take 1 tablet by mouth daily. 04/23/20  Yes Swayze, Ava, DO   Review of Systems  Constitutional:  Positive for fatigue. Negative for appetite change.  HENT:   Negative for congestion, postnasal drip and sore throat.   Eyes: Negative.   Respiratory:  Negative for cough, chest tightness and shortness of breath.   Cardiovascular:  Negative for chest pain, palpitations and leg swelling.  Gastrointestinal:  Negative for abdominal distention and abdominal pain.  Endocrine: Negative.   Genitourinary: Negative.   Musculoskeletal:  Negative for back pain and neck pain.  Skin: Negative.   Allergic/Immunologic: Negative.   Neurological:  Negative for dizziness and light-headedness.  Hematological:  Negative for adenopathy. Does not bruise/bleed easily.  Psychiatric/Behavioral:  Negative for dysphoric mood and sleep disturbance (sleeping on towels). The patient is not nervous/anxious.    Vitals:   05/30/22 1225  BP: (!) 144/51  Pulse: 68  Resp: 16  SpO2: 100%  Weight: 111 lb 4 oz (50.5 kg)  Height: '5\' 2"'$  (1.575 m)   Wt Readings from Last 3 Encounters:  05/30/22 111 lb 4 oz (50.5 kg)  05/18/22 110 lb (49.9 kg)  05/15/22 125 lb (56.7 kg)   Lab Results  Component Value Date   CREATININE 0.86 05/16/2022   CREATININE 0.94 05/15/2022   CREATININE 0.84 08/29/2021   Physical Exam Vitals and nursing note reviewed. Exam conducted with a chaperone present (daughter).  Constitutional:      Appearance: Normal appearance.  HENT:     Head: Normocephalic and atraumatic.  Cardiovascular:     Rate and Rhythm: Normal rate and regular rhythm.  Pulmonary:     Effort: Pulmonary effort is normal. No respiratory distress.     Breath sounds: No wheezing or rales.  Abdominal:     General: There is no distension.     Palpations: Abdomen is soft.  Musculoskeletal:        General: No tenderness.     Cervical back: Normal range of motion and neck supple.     Right lower leg: No edema.     Left lower leg: No edema.  Skin:    General: Skin is warm and dry.  Neurological:     General: No focal deficit present.     Mental Status: She is alert and oriented to  person, place, and time.  Psychiatric:        Mood and Affect: Mood normal.        Behavior: Behavior normal.        Thought Content: Thought content normal.    Assessment & Plan:  1: Chronic heart failure with preserved ejection fraction- - NYHA class II - euvolemic today - weighing daily; reminded to call for an overnight weight gain of > 2 pounds or a weekly weight gain of > 5 pounds - weight stable from last visit here 12 days ago - watching her sodium intake - echo done earlier today - history of recurrent UTI so most likely will not be a candidate for SGLT2 - BNP 05/15/22 was 456.6 - PharmD reconciled medications   2: HTN- - BP mildly elevated (144/51) -  saw PCP Kary Kos) 05/24/22 - BMP 05/24/22 reviewed and showed sodium 142, potassium 5.0, creatinine 1.1 and GFR 47  3: CLL- - intolerant to several chemotherapeutic agents and currently off treatment since October 2022   Medication bottles reviewed.   Will call patient when echo results are received. Will not make a return appointment at this time. If need be, can schedule another appointment once her echo results are obtained.

## 2022-05-30 ENCOUNTER — Ambulatory Visit
Admission: RE | Admit: 2022-05-30 | Discharge: 2022-05-30 | Disposition: A | Discharge status: Home / Self Care | Payer: Medicare Other | Source: Ambulatory Visit | Attending: Family | Admitting: Family

## 2022-05-30 ENCOUNTER — Encounter: Payer: Self-pay | Admitting: Family

## 2022-05-30 ENCOUNTER — Ambulatory Visit (HOSPITAL_BASED_OUTPATIENT_CLINIC_OR_DEPARTMENT_OTHER): Payer: Medicare Other | Admitting: Family

## 2022-05-30 VITALS — BP 144/51 | HR 68 | Resp 16 | Ht 62.0 in | Wt 111.2 lb

## 2022-05-30 DIAGNOSIS — I080 Rheumatic disorders of both mitral and aortic valves: Secondary | ICD-10-CM | POA: Diagnosis not present

## 2022-05-30 DIAGNOSIS — I5032 Chronic diastolic (congestive) heart failure: Secondary | ICD-10-CM | POA: Insufficient documentation

## 2022-05-30 DIAGNOSIS — N189 Chronic kidney disease, unspecified: Secondary | ICD-10-CM | POA: Diagnosis not present

## 2022-05-30 DIAGNOSIS — Z87440 Personal history of urinary (tract) infections: Secondary | ICD-10-CM | POA: Insufficient documentation

## 2022-05-30 DIAGNOSIS — I130 Hypertensive heart and chronic kidney disease with heart failure and stage 1 through stage 4 chronic kidney disease, or unspecified chronic kidney disease: Secondary | ICD-10-CM | POA: Insufficient documentation

## 2022-05-30 DIAGNOSIS — I10 Essential (primary) hypertension: Secondary | ICD-10-CM

## 2022-05-30 DIAGNOSIS — R5383 Other fatigue: Secondary | ICD-10-CM | POA: Diagnosis present

## 2022-05-30 DIAGNOSIS — C9110 Chronic lymphocytic leukemia of B-cell type not having achieved remission: Secondary | ICD-10-CM | POA: Insufficient documentation

## 2022-05-30 DIAGNOSIS — M797 Fibromyalgia: Secondary | ICD-10-CM | POA: Diagnosis not present

## 2022-05-30 DIAGNOSIS — G2581 Restless legs syndrome: Secondary | ICD-10-CM | POA: Insufficient documentation

## 2022-05-30 LAB — ECHOCARDIOGRAM COMPLETE
AR max vel: 1.06 cm2
AV Area VTI: 1.17 cm2
AV Area mean vel: 0.98 cm2
AV Mean grad: 13.7 mmHg
AV Peak grad: 22.7 mmHg
Ao pk vel: 2.38 m/s
Area-P 1/2: 2.32 cm2
Calc EF: 33.5 %
MV VTI: 1.36 cm2
P 1/2 time: 472 msec
S' Lateral: 3.8 cm
Single Plane A2C EF: 9.6 %
Single Plane A4C EF: 55.3 %

## 2022-05-30 NOTE — Patient Instructions (Addendum)
Continue weighing daily and call for an overnight weight gain of 3 pounds or more or a weekly weight gain of more than 5 pounds.   If you have voicemail, please make sure your mailbox is cleaned out so that we may leave a message and please make sure to listen to any voicemails.    If you receive a satisfaction survey regarding the [facility], please take the time to fill it out. This way we can continue to provide excellent care and make any changes that need to be made.     Call us in the future if you need [place] for anything. I will call you with the echo results

## 2022-05-30 NOTE — Progress Notes (Signed)
*  PRELIMINARY RESULTS* Echocardiogram 2D Echocardiogram has been performed.  Lindsay [name] 05/30/2022, 12:03 PM

## 2022-05-31 ENCOUNTER — Telehealth: Payer: Self-pay

## 2022-05-31 NOTE — Telephone Encounter (Signed)
-----   Message from Alisa Graff, [place] sent at 05/31/2022  8:33 AM EDT ----- Echo shows a mildly reduced ejection fraction which means the heart muscle is not squeezing quite as well as we would like. It is just mildly reduced. No enlargement of the heart. A couple of the valves show that they aren't squeezing shut as tightly as normal. I have forwarded the results to Dr. Vladimir Faster and he can review in more detail next week at your appointment and discuss any medication changes that may be needed.

## 2022-06-07 ENCOUNTER — Ambulatory Visit: Payer: Medicare Other | Admitting: Interventional Cardiology

## 2022-06-07 NOTE — Progress Notes (Deleted)
Cardiology Office Note   Date:  06/07/2022   ID:  Lindsay [name], DOB September 16, 1934, MRN [id number]  PCP:  Lindsay Pink, MD    No chief complaint on file.    Wt Readings from Last 3 Encounters:  05/30/22 111 lb 4 oz (50.5 kg)  05/18/22 110 lb (49.9 kg)  05/15/22 125 lb (56.7 kg)       History of Present Illness: Lindsay [name] is a 88 y.o. female  ***    Past Medical History:  Diagnosis Date   Arthritis    RHEUMATOID   CHF (congestive heart failure) (HCC)    CLL (chronic lymphocytic leukemia) (HCC)    Edema    MILD ANKLE OCCAS   Fibromyalgia    GERD (gastroesophageal reflux disease)    Hypertension    Kidney stones    RLS (restless legs syndrome)     Past Surgical History:  Procedure Laterality Date   ABDOMINAL HYSTERECTOMY     CATARACT EXTRACTION W/PHACO Left 02/07/2016   Procedure: CATARACT EXTRACTION PHACO AND INTRAOCULAR LENS PLACEMENT ([place]);  Surgeon: Lindsay Cotta, MD;  Location: [facility] ORS;  Service: Ophthalmology;  Laterality: Left;  [place] 01:33 AP% 24.7 CDE 39.57 fluid pack lot # [id number]   EXTRACORPOREAL SHOCK WAVE LITHOTRIPSY Right 07/03/2019   Procedure: EXTRACORPOREAL SHOCK WAVE LITHOTRIPSY (ESWL);  Surgeon: Lindsay Cowper, MD;  Location: [facility] ORS;  Service: Urology;  Laterality: Right;   EYE SURGERY     HIP ARTHROPLASTY Left 04/20/2020   Procedure: ARTHROPLASTY BIPOLAR HIP (HEMIARTHROPLASTY);  Surgeon: Lindsay Knows, MD;  Location: [facility] ORS;  Service: Orthopedics;  Laterality: Left;   LITHOTRIPSY     TONSILLECTOMY       Current Outpatient Medications  Medication Sig Dispense Refill   amitriptyline (ELAVIL) 10 MG tablet Take 20 mg by mouth at bedtime.     amLODipine (NORVASC) 5 MG tablet Take 5 mg by mouth daily.     amoxicillin (AMOXIL) 875 MG tablet Take 875 mg by mouth 2 (two) times daily.     cyanocobalamin (,VITAMIN B-12,) 1000 MCG/ML injection Inject into the muscle every 30 (thirty) days.     feeding supplement, ENSURE ENLIVE,  (ENSURE ENLIVE) LIQD Take 237 mLs by mouth 2 (two) times daily between meals. 237 mL 12   furosemide (LASIX) 20 MG tablet Take 1 tablet (20 mg total) by mouth daily. (Patient taking differently: Take 20 mg by mouth every other day.) 30 tablet 11   HYDROcodone-acetaminophen (NORCO/VICODIN) 5-325 MG tablet Take 1-2 tablets by mouth every 4 (four) hours as needed for moderate pain (pain score 4-6). (Patient taking differently: Take 1-2 tablets by mouth every 4 (four) hours as needed for moderate pain (pain score 4-6). Takes ~twice daily for arthritis) 30 tablet 0   Multiple Vitamin (MULTIVITAMIN WITH MINERALS) TABS tablet Take 1 tablet by mouth daily. 30 tablet 0   No current facility-administered medications for this visit.    Allergies:   Demerol [meperidine] and Septra [sulfamethoxazole-trimethoprim]    Social History:  The patient  reports that she has never smoked. She has never used smokeless tobacco. She reports that she does not drink alcohol and does not use drugs.   Family History:  The patient's ***family history includes Cancer in her mother.    ROS:  Please see the history of present illness.   Otherwise, review of systems are positive for ***.   All other systems are reviewed and negative.    PHYSICAL EXAM: VS:  There were  no vitals taken for this visit. , BMI There is no height or weight on file to calculate BMI. GEN: Well nourished, well developed, in no acute distress HEENT: normal Neck: no JVD, carotid bruits, or masses Cardiac: ***RRR; no murmurs, rubs, or gallops,no edema  Respiratory:  clear to auscultation bilaterally, normal work of breathing GI: soft, nontender, nondistended, + BS MS: no deformity or atrophy Skin: warm and dry, no rash Neuro:  Strength and sensation are intact Psych: euthymic mood, full affect   EKG:   The ekg ordered today demonstrates ***   Recent Labs: 05/15/2022: ALT 13; B Natriuretic Peptide 456.6 05/16/2022: BUN 20; Creatinine, Ser  0.86; Hemoglobin 7.2; Platelets 155; Potassium 3.8; Sodium 143   Lipid Panel No results found for: "CHOL", "TRIG", "HDL", "CHOLHDL", "VLDL", "LDLCALC", "LDLDIRECT"   Other studies Reviewed: Additional studies/ records that were reviewed today with results demonstrating: ***.   ASSESSMENT AND PLAN:  ***  *** ***   Current medicines are reviewed at length with the patient today.  The patient concerns regarding her medicines were addressed.  The following changes have been made:  No change***  Labs/ tests ordered today include: *** No orders of the defined types were placed in this encounter.   Recommend 150 minutes/week of aerobic exercise Low fat, low carb, high fiber diet recommended  Disposition:   FU in ***   Signed, Lindsay Grooms, MD  06/07/2022 10:22 AM    [facility] [address], [place], [place]  [address] Phone: [phone]; Fax: [phone]

## 2022-06-26 ENCOUNTER — Ambulatory Visit: Payer: Medicare Other | Admitting: Cardiology

## 2022-06-26 ENCOUNTER — Ambulatory Visit: Payer: Medicare Other | Admitting: Interventional Cardiology

## 2022-06-28 ENCOUNTER — Encounter: Payer: Self-pay | Admitting: Internal Medicine

## 2022-06-29 ENCOUNTER — Inpatient Hospital Stay: Payer: Medicare Other | Admitting: Hospice and Palliative Medicine

## 2022-06-29 ENCOUNTER — Other Ambulatory Visit: Payer: Self-pay | Admitting: *Deleted

## 2022-06-29 ENCOUNTER — Inpatient Hospital Stay: Payer: Medicare Other

## 2022-06-29 ENCOUNTER — Encounter: Payer: Self-pay | Admitting: *Deleted

## 2022-06-29 DIAGNOSIS — C9110 Chronic lymphocytic leukemia of B-cell type not having achieved remission: Secondary | ICD-10-CM

## 2022-06-29 DIAGNOSIS — R051 Acute cough: Secondary | ICD-10-CM

## 2022-06-29 DIAGNOSIS — I509 Heart failure, unspecified: Secondary | ICD-10-CM

## 2022-06-29 DIAGNOSIS — R0989 Other specified symptoms and signs involving the circulatory and respiratory systems: Secondary | ICD-10-CM

## 2022-06-29 NOTE — Telephone Encounter (Signed)
Apt scheduled.  

## 2022-06-30 ENCOUNTER — Inpatient Hospital Stay (HOSPITAL_BASED_OUTPATIENT_CLINIC_OR_DEPARTMENT_OTHER): Payer: Medicare Other | Admitting: Hospice and Palliative Medicine

## 2022-06-30 ENCOUNTER — Ambulatory Visit
Admission: RE | Admit: 2022-06-30 | Discharge: 2022-06-30 | Disposition: A | Discharge status: Home / Self Care | Payer: Medicare Other | Source: Ambulatory Visit | Attending: Hospice and Palliative Medicine | Admitting: Hospice and Palliative Medicine

## 2022-06-30 ENCOUNTER — Encounter: Payer: Self-pay | Admitting: Hospice and Palliative Medicine

## 2022-06-30 ENCOUNTER — Ambulatory Visit
Admission: RE | Admit: 2022-06-30 | Discharge: 2022-06-30 | Disposition: A | Discharge status: Home / Self Care | Payer: Medicare Other | Attending: Hospice and Palliative Medicine | Admitting: Hospice and Palliative Medicine

## 2022-06-30 ENCOUNTER — Inpatient Hospital Stay: Payer: Medicare Other | Attending: Hospice and Palliative Medicine

## 2022-06-30 VITALS — BP 164/50 | HR 78 | Temp 98.6°F | Resp 20 | Wt 112.6 lb

## 2022-06-30 DIAGNOSIS — J8489 Other specified interstitial pulmonary diseases: Secondary | ICD-10-CM | POA: Diagnosis not present

## 2022-06-30 DIAGNOSIS — R0989 Other specified symptoms and signs involving the circulatory and respiratory systems: Secondary | ICD-10-CM | POA: Insufficient documentation

## 2022-06-30 DIAGNOSIS — I509 Heart failure, unspecified: Secondary | ICD-10-CM | POA: Diagnosis not present

## 2022-06-30 DIAGNOSIS — I110 Hypertensive heart disease with heart failure: Secondary | ICD-10-CM | POA: Diagnosis not present

## 2022-06-30 DIAGNOSIS — C9110 Chronic lymphocytic leukemia of B-cell type not having achieved remission: Secondary | ICD-10-CM

## 2022-06-30 DIAGNOSIS — K219 Gastro-esophageal reflux disease without esophagitis: Secondary | ICD-10-CM | POA: Insufficient documentation

## 2022-06-30 DIAGNOSIS — R051 Acute cough: Secondary | ICD-10-CM | POA: Insufficient documentation

## 2022-06-30 DIAGNOSIS — N200 Calculus of kidney: Secondary | ICD-10-CM | POA: Insufficient documentation

## 2022-06-30 DIAGNOSIS — G2581 Restless legs syndrome: Secondary | ICD-10-CM | POA: Insufficient documentation

## 2022-06-30 DIAGNOSIS — R0602 Shortness of breath: Secondary | ICD-10-CM | POA: Insufficient documentation

## 2022-06-30 DIAGNOSIS — I700 Atherosclerosis of aorta: Secondary | ICD-10-CM | POA: Insufficient documentation

## 2022-06-30 DIAGNOSIS — Z87440 Personal history of urinary (tract) infections: Secondary | ICD-10-CM | POA: Insufficient documentation

## 2022-06-30 DIAGNOSIS — Z79899 Other long term (current) drug therapy: Secondary | ICD-10-CM | POA: Insufficient documentation

## 2022-06-30 LAB — CBC WITH DIFFERENTIAL/PLATELET
Abs Immature Granulocytes: 0 10*3/uL (ref 0.00–0.07)
Band Neutrophils: 0 %
Basophils Absolute: 0 10*3/uL (ref 0.0–0.1)
Basophils Relative: 0 %
Blasts: 0 %
Eosinophils Absolute: 0.3 10*3/uL (ref 0.0–0.5)
Eosinophils Relative: 1 %
HCT: 24.6 % — ABNORMAL LOW (ref 36.0–46.0)
Hemoglobin: 7.7 g/dL — ABNORMAL LOW (ref 12.0–15.0)
Lymphocytes Relative: 87 %
Lymphs Abs: 29.8 10*3/uL — ABNORMAL HIGH (ref 0.7–4.0)
MCH: 34.4 pg — ABNORMAL HIGH (ref 26.0–34.0)
MCHC: 31.3 g/dL (ref 30.0–36.0)
MCV: 109.8 fL — ABNORMAL HIGH (ref 80.0–100.0)
Metamyelocytes Relative: 0 %
Monocytes Absolute: 1 10*3/uL (ref 0.1–1.0)
Monocytes Relative: 3 %
Myelocytes: 0 %
Neutro Abs: 3.1 10*3/uL (ref 1.7–7.7)
Neutrophils Relative %: 9 %
Other: 0 %
Platelets: 192 10*3/uL (ref 150–400)
Promyelocytes Relative: 0 %
RBC: 2.24 MIL/uL — ABNORMAL LOW (ref 3.87–5.11)
RDW: 15.9 % — ABNORMAL HIGH (ref 11.5–15.5)
Smear Review: ADEQUATE
WBC: 34.2 10*3/uL — ABNORMAL HIGH (ref 4.0–10.5)
nRBC: 0 % (ref 0.0–0.2)
nRBC: 0 /100 WBC

## 2022-06-30 LAB — COMPREHENSIVE METABOLIC PANEL
ALT: 11 U/L (ref 0–44)
AST: 21 U/L (ref 15–41)
Albumin: 3.9 g/dL (ref 3.5–5.0)
Alkaline Phosphatase: 89 U/L (ref 38–126)
Anion gap: 6 (ref 5–15)
BUN: 23 mg/dL (ref 8–23)
CO2: 23 mmol/L (ref 22–32)
Calcium: 8.6 mg/dL — ABNORMAL LOW (ref 8.9–10.3)
Chloride: 111 mmol/L (ref 98–111)
Creatinine, Ser: 0.97 mg/dL (ref 0.44–1.00)
GFR, Estimated: 57 mL/min — ABNORMAL LOW (ref >60)
Glucose, Bld: 123 mg/dL — ABNORMAL HIGH (ref 70–99)
Potassium: 4.3 mmol/L (ref 3.5–5.1)
Sodium: 140 mmol/L (ref 135–145)
Total Bilirubin: 0.9 mg/dL (ref 0.3–1.2)
Total Protein: 6 g/dL — ABNORMAL LOW (ref 6.5–8.1)

## 2022-06-30 LAB — LACTATE DEHYDROGENASE: LDH: 175 U/L (ref 98–192)

## 2022-06-30 LAB — BRAIN NATRIURETIC PEPTIDE: B Natriuretic Peptide: 602.8 pg/mL — ABNORMAL HIGH (ref 0.0–100.0)

## 2022-06-30 MED ORDER — AMOXICILLIN-POT CLAVULANATE 875-125 MG PO TABS: 1.0000 | ORAL_TABLET | Freq: Two times a day (BID) | ORAL | 0 refills | Status: DC

## 2022-06-30 NOTE — Progress Notes (Signed)
Pt Had a bad spell in May with her breathing. She got better but a month ago her symptoms have started coming back. Her PCP started  her on amoxicillin this week for cough, congestion and yellow , ropey phlegm. Pt feels smothery and like her air is cutting off especially when she lies down.Had a CXR prior to coming here. Covid test is negative.

## 2022-06-30 NOTE — Progress Notes (Signed)
Symptom Management [place] at [facility] [phone]) [phone] [phone]  Patient Care Team: [name], MD as PCP - General (Family Medicine)   Name of the patient: Lindsay [name]  [id number]  [birth year]   Date of visit: 06/30/22  Reason for Consult: Lindsay [name] is a 88 y.o. female with multiple medical problems including HTN, CLL diagnosed in early 2022, intolerant to several chemotherapeutic agents and currently off treatment since October 2022, nephrolithiasis s/p lithotripsy 2020 with recurrent UTI.   Patient was hospitalized in May 2023 for CHF exacerbation. ECHO 05/30/2022 with EF of 45 to 50% and grade 1 diastolic dysfunction.  Patient was previously followed by CHF clinic.  CTA of the chest 05/15/2022 revealed extensive thoracic lymphadenopathy consistent with known CLL.  Patient presents to [facility] today for evaluation of orthopnea and productive cough.  She reports worsening cough and shortness of breath intermittently when she lies flat.  She says these are similar symptoms leading up to her previous hospitalization.  She endorses periodic fevers and says that her temp was 101 over the weekend.  She denies fever now.  She denies shortness of breath at present.  Of note, patient says she is no longer taking Lasix and was discharged from the CHF clinic.  Denies any neurologic complaints.  Denies any easy bleeding or bruising. Reports good appetite and denies weight loss. Denies chest pain. Denies any nausea, vomiting, constipation, or diarrhea. Denies urinary complaints. Patient offers no further specific complaints today.  PAST MEDICAL HISTORY: Past Medical History:  Diagnosis Date   Arthritis    RHEUMATOID   CHF (congestive heart failure) (HCC)    CLL (chronic lymphocytic leukemia) (HCC)    Edema    MILD ANKLE OCCAS   Fibromyalgia    GERD (gastroesophageal reflux disease)    Hypertension    Kidney stones    RLS  (restless legs syndrome)     PAST SURGICAL HISTORY:  Past Surgical History:  Procedure Laterality Date   ABDOMINAL HYSTERECTOMY     CATARACT EXTRACTION W/PHACO Left 02/07/2016   Procedure: CATARACT EXTRACTION PHACO AND INTRAOCULAR LENS PLACEMENT ([place]);  Surgeon: Estill Cotta, MD;  Location: [facility] ORS;  Service: Ophthalmology;  Laterality: Left;  [place] 01:33 AP% 24.7 CDE 39.57 fluid pack lot # [id number]   EXTRACORPOREAL SHOCK WAVE LITHOTRIPSY Right 07/03/2019   Procedure: EXTRACORPOREAL SHOCK WAVE LITHOTRIPSY (ESWL);  Surgeon: Royston Cowper, MD;  Location: [facility] ORS;  Service: Urology;  Laterality: Right;   EYE SURGERY     HIP ARTHROPLASTY Left 04/20/2020   Procedure: ARTHROPLASTY BIPOLAR HIP (HEMIARTHROPLASTY);  Surgeon: Hessie Knows, MD;  Location: [facility] ORS;  Service: Orthopedics;  Laterality: Left;   LITHOTRIPSY     TONSILLECTOMY      HEMATOLOGY/ONCOLOGY HISTORY:  Oncology History Overview Note  # LYMPHOCYTOSIS [25,K]/ANEMIA 9-10]; platelets-N; IgVH Somatic Hypermutation was not detected.56% OF NUCLEI POSITIVE FOR A 13Q DELETION; The phenotype is most consistent with a diagnosis of 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 Low level hypermetabolism consistent with CLL.2. Splenomegaly but no focal splenic lesions.  # April 2022- 13 Q DEL; IGVH- UNMUTATED  # MID April 2022- IBRUTINIB 280 mg x3 days [STOPPED sec ER/hypertensive emergency- 191/129]  # MAY 26th, 2022- start acalabrutinib 100 mg once a day [reduced dose]; intermittent; discontinued  September mid 2022. [Muscle cramps.]       CLL (chronic lymphocytic leukemia) ([place])  02/14/2021 Initial Diagnosis   CLL (chronic lymphocytic leukemia) ([place])   04/07/2021 Cancer Staging   Staging form: Chronic Lymphocytic Leukemia / Small Lymphocytic Lymphoma, AJCC 8th Edition - Clinical: Modified Rai Stage  III (Modified Rai risk: High, Binet: Stage C) - Signed by Cammie Sickle, MD on 04/07/2021 Stage prefix: Initial diagnosis   12/16/2021 -  Chemotherapy   Patient is on Treatment Plan : CLL - Rituximab q 4 weeks       ALLERGIES:  is allergic to demerol [meperidine] and septra [sulfamethoxazole-trimethoprim].  MEDICATIONS:  Current Outpatient Medications  Medication Sig Dispense Refill   amitriptyline (ELAVIL) 10 MG tablet Take 20 mg by mouth at bedtime.     amLODipine (NORVASC) 5 MG tablet Take 5 mg by mouth daily.     amoxicillin (AMOXIL) 875 MG tablet Take 875 mg by mouth 2 (two) times daily.     cyanocobalamin (,VITAMIN B-12,) 1000 MCG/ML injection Inject into the muscle every 30 (thirty) days.     feeding supplement, ENSURE ENLIVE, (ENSURE ENLIVE) LIQD Take 237 mLs by mouth 2 (two) times daily between meals. 237 mL 12   HYDROcodone-acetaminophen (NORCO/VICODIN) 5-325 MG tablet Take 1-2 tablets by mouth every 4 (four) hours as needed for moderate pain (pain score 4-6). (Patient taking differently: Take 1-2 tablets by mouth every 4 (four) hours as needed for moderate pain (pain score 4-6). Takes ~twice daily for arthritis) 30 tablet 0   Multiple Vitamin (MULTIVITAMIN WITH MINERALS) TABS tablet Take 1 tablet by mouth daily. 30 tablet 0   furosemide (LASIX) 20 MG tablet Take 1 tablet (20 mg total) by mouth daily. (Patient not taking: Reported on 06/30/2022) 30 tablet 11   No current facility-administered medications for this visit.    VITAL SIGNS: BP (!) 164/50 (BP Location: Left Arm)   Pulse 78   Temp 98.6 F (37 C) (Tympanic)   Resp 20   Wt 112 lb 9.6 oz (51.1 kg)   SpO2 100%   BMI 20.59 kg/m  Filed Weights   06/30/22 1329  Weight: 112 lb 9.6 oz (51.1 kg)    Estimated body mass index is 20.59 kg/m as calculated from the following:   Height as of 05/30/22: '5\' 2"'$  (1.575 m).   Weight as of this encounter: 112 lb 9.6 oz (51.1 kg).  LABS: CBC:    Component Value  Date/Time   WBC 34.2 (H) 06/30/2022 1256   HGB 7.7 (L) 06/30/2022 1256   HGB 14.1 03/06/2014 2049   HCT 24.6 (L) 06/30/2022 1256   HCT 42.9 03/06/2014 2049   PLT 192 06/30/2022 1256   PLT 240 03/06/2014 2049   MCV 109.8 (H) 06/30/2022 1256   MCV 88 03/06/2014 2049   NEUTROABS PENDING 06/30/2022 1256   NEUTROABS 7.3 (H) 03/06/2014 2049   LYMPHSABS PENDING 06/30/2022 1256   LYMPHSABS 6.6 (H) 03/06/2014 2049   MONOABS PENDING 06/30/2022 1256   MONOABS 1.3 (H) 03/06/2014 2049   EOSABS PENDING 06/30/2022 1256   EOSABS 0.4 03/06/2014 2049   BASOSABS PENDING 06/30/2022 1256   BASOSABS 0.1 03/06/2014 2049   Comprehensive Metabolic Panel:    Component Value Date/Time   NA 140 06/30/2022 1256   NA 138 03/06/2014 2049   K 4.3 06/30/2022 1256   K 3.6 03/06/2014 2049   CL 111 06/30/2022 1256   CL 107 03/06/2014 2049   CO2 23 06/30/2022 1256   CO2 29 03/06/2014 2049   BUN 23 06/30/2022 1256   BUN 14 03/06/2014 2049  CREATININE 0.97 06/30/2022 1256   CREATININE 0.75 03/06/2014 2049   GLUCOSE 123 (H) 06/30/2022 1256   GLUCOSE 103 (H) 03/06/2014 2049   CALCIUM 8.6 (L) 06/30/2022 1256   CALCIUM 8.9 03/06/2014 2049   AST 21 06/30/2022 1256   ALT 11 06/30/2022 1256   ALKPHOS 89 06/30/2022 1256   BILITOT 0.9 06/30/2022 1256   PROT 6.0 (L) 06/30/2022 1256   ALBUMIN 3.9 06/30/2022 1256    RADIOGRAPHIC STUDIES: DG Chest 2 View  Result Date: 06/30/2022 CLINICAL DATA:  Provided history: Cough, congestion, shortness of breath, history of CHF. EXAM: CHEST - 2 VIEW COMPARISON:  CT angiogram chest 05/15/2022. Chest radiograph 05/15/2022. FINDINGS: Size at the upper limits of normal. Aortic atherosclerosis. Interstitial predominant opacities within the bilateral lung bases. No evidence of pleural effusion or pneumothorax. No acute bony abnormality identified. Dextrocurvature of the thoracic spine. IMPRESSION: Interstitial predominant opacities within the bilateral lung bases. Findings may  reflect interstitial edema. However, correlate clinically to exclude signs/symptoms of an atypical/viral infection (which can have a similar imaging appearance). Aortic Atherosclerosis (ICD10-I70.0). Electronically Signed   By: Kellie Simmering D.O.   On: 06/30/2022 10:44    PERFORMANCE STATUS (ECOG) : 2 - Symptomatic, <50% confined to bed  Review of Systems Unless otherwise noted, a complete review of systems is negative.  Physical Exam General: NAD Cardiovascular: regular rate and rhythm Pulmonary: clear ant fields Abdomen: soft, nontender, + bowel sounds GU: no suprapubic tenderness Extremities: trace BLE edema, no joint deformities Skin: no rashes Neurological: Weakness but otherwise nonfocal  Assessment and Plan- Patient is a 88 y.o. female multiple medical problems including HTN, CLL diagnosed in early 2022, intolerant to several chemotherapeutic agents and currently off treatment since October 2022, nephrolithiasis s/p lithotripsy 2020 with recurrent UTI.    Shortness of breath -chest x-ray shows bilateral opacities with possible interstitial edema +/- atypical infection.  We will start patient on Augmentin.  Also recommended that she resume Lasix as needed for symptoms.  We discussed use of a wedge pillow.  Recommended monitoring daily weights.  She can be referred back to CHF clinic if symptoms persist.  CLL -patient has previously been intolerant of treatments.  She says she is interested in speaking with Dr. Rogue Bussing again about possible chemotherapy.  Case and plan discussed with Dr. Rogue Bussing.  Will schedule follow-up 1 month   Patient expressed understanding and was in agreement with this plan. She also understands that She can call clinic at any time with any questions, concerns, or complaints.   Thank you for allowing me to participate in the care of this very pleasant patient.   Time Total: 15 minutes  Visit consisted of counseling and education dealing with the  complex and emotionally intense issues of symptom management in the setting of serious illness.Greater than 50%  of this time was spent counseling and coordinating care related to the above assessment and plan.  Signed by: Altha Harm, PhD, NP-C

## 2022-07-10 ENCOUNTER — Other Ambulatory Visit: Payer: Self-pay

## 2022-07-16 ENCOUNTER — Encounter: Payer: Self-pay | Admitting: Emergency Medicine

## 2022-07-16 ENCOUNTER — Emergency Department: Payer: Medicare Other

## 2022-07-16 ENCOUNTER — Inpatient Hospital Stay
Admission: EM | Admit: 2022-07-16 | Discharge: 2022-07-19 | DRG: 291 | Disposition: A | Discharge status: Home / Self Care | Payer: Medicare Other | Attending: Hospitalist | Admitting: Hospitalist

## 2022-07-16 ENCOUNTER — Other Ambulatory Visit: Payer: Self-pay

## 2022-07-16 DIAGNOSIS — Z882 Allergy status to sulfonamides status: Secondary | ICD-10-CM

## 2022-07-16 DIAGNOSIS — J90 Pleural effusion, not elsewhere classified: Secondary | ICD-10-CM

## 2022-07-16 DIAGNOSIS — D539 Nutritional anemia, unspecified: Secondary | ICD-10-CM | POA: Diagnosis present

## 2022-07-16 DIAGNOSIS — M797 Fibromyalgia: Secondary | ICD-10-CM | POA: Diagnosis present

## 2022-07-16 DIAGNOSIS — I10 Essential (primary) hypertension: Secondary | ICD-10-CM

## 2022-07-16 DIAGNOSIS — E538 Deficiency of other specified B group vitamins: Secondary | ICD-10-CM

## 2022-07-16 DIAGNOSIS — I110 Hypertensive heart disease with heart failure: Secondary | ICD-10-CM | POA: Diagnosis not present

## 2022-07-16 DIAGNOSIS — I080 Rheumatic disorders of both mitral and aortic valves: Secondary | ICD-10-CM | POA: Diagnosis present

## 2022-07-16 DIAGNOSIS — C9110 Chronic lymphocytic leukemia of B-cell type not having achieved remission: Secondary | ICD-10-CM | POA: Diagnosis present

## 2022-07-16 DIAGNOSIS — K219 Gastro-esophageal reflux disease without esophagitis: Secondary | ICD-10-CM | POA: Diagnosis present

## 2022-07-16 DIAGNOSIS — Z66 Do not resuscitate: Secondary | ICD-10-CM | POA: Diagnosis present

## 2022-07-16 DIAGNOSIS — R7989 Other specified abnormal findings of blood chemistry: Secondary | ICD-10-CM

## 2022-07-16 DIAGNOSIS — J9601 Acute respiratory failure with hypoxia: Secondary | ICD-10-CM

## 2022-07-16 DIAGNOSIS — M069 Rheumatoid arthritis, unspecified: Secondary | ICD-10-CM | POA: Diagnosis present

## 2022-07-16 DIAGNOSIS — Z9842 Cataract extraction status, left eye: Secondary | ICD-10-CM

## 2022-07-16 DIAGNOSIS — G2581 Restless legs syndrome: Secondary | ICD-10-CM | POA: Diagnosis present

## 2022-07-16 DIAGNOSIS — Z20822 Contact with and (suspected) exposure to covid-19: Secondary | ICD-10-CM | POA: Diagnosis present

## 2022-07-16 DIAGNOSIS — I5043 Acute on chronic combined systolic (congestive) and diastolic (congestive) heart failure: Secondary | ICD-10-CM | POA: Diagnosis present

## 2022-07-16 DIAGNOSIS — I700 Atherosclerosis of aorta: Secondary | ICD-10-CM | POA: Diagnosis present

## 2022-07-16 DIAGNOSIS — I5023 Acute on chronic systolic (congestive) heart failure: Secondary | ICD-10-CM

## 2022-07-16 DIAGNOSIS — R0602 Shortness of breath: Secondary | ICD-10-CM | POA: Diagnosis not present

## 2022-07-16 DIAGNOSIS — Z96642 Presence of left artificial hip joint: Secondary | ICD-10-CM | POA: Diagnosis present

## 2022-07-16 DIAGNOSIS — Z888 Allergy status to other drugs, medicaments and biological substances status: Secondary | ICD-10-CM

## 2022-07-16 DIAGNOSIS — Z87442 Personal history of urinary calculi: Secondary | ICD-10-CM

## 2022-07-16 DIAGNOSIS — A419 Sepsis, unspecified organism: Secondary | ICD-10-CM

## 2022-07-16 DIAGNOSIS — Z90710 Acquired absence of both cervix and uterus: Secondary | ICD-10-CM

## 2022-07-16 DIAGNOSIS — I447 Left bundle-branch block, unspecified: Secondary | ICD-10-CM | POA: Diagnosis present

## 2022-07-16 DIAGNOSIS — I248 Other forms of acute ischemic heart disease: Secondary | ICD-10-CM | POA: Diagnosis present

## 2022-07-16 DIAGNOSIS — Z961 Presence of intraocular lens: Secondary | ICD-10-CM | POA: Diagnosis present

## 2022-07-16 DIAGNOSIS — Z87440 Personal history of urinary (tract) infections: Secondary | ICD-10-CM

## 2022-07-16 LAB — TROPONIN I (HIGH SENSITIVITY)
Troponin I (High Sensitivity): 27 ng/L — ABNORMAL HIGH (ref <18)
Troponin I (High Sensitivity): 45 ng/L — ABNORMAL HIGH (ref <18)

## 2022-07-16 LAB — PROTIME-INR
INR: 1.1 (ref 0.8–1.2)
Prothrombin Time: 14.4 seconds (ref 11.4–15.2)

## 2022-07-16 LAB — CBC WITH DIFFERENTIAL/PLATELET
Abs Immature Granulocytes: 0.15 10*3/uL — ABNORMAL HIGH (ref 0.00–0.07)
Basophils Absolute: 0.1 10*3/uL (ref 0.0–0.1)
Basophils Relative: 0 %
Eosinophils Absolute: 1.4 10*3/uL — ABNORMAL HIGH (ref 0.0–0.5)
Eosinophils Relative: 0 %
HCT: 26.6 % — ABNORMAL LOW (ref 36.0–46.0)
Hemoglobin: 7.9 g/dL — ABNORMAL LOW (ref 12.0–15.0)
Immature Granulocytes: 0 %
Lymphocytes Relative: 92 %
Lymphs Abs: 67.3 10*3/uL — ABNORMAL HIGH (ref 0.7–4.0)
MCH: 33.5 pg (ref 26.0–34.0)
MCHC: 29.7 g/dL — ABNORMAL LOW (ref 30.0–36.0)
MCV: 112.7 fL — ABNORMAL HIGH (ref 80.0–100.0)
Monocytes Absolute: 2.4 10*3/uL — ABNORMAL HIGH (ref 0.1–1.0)
Monocytes Relative: 2 %
Neutro Abs: 3.9 10*3/uL (ref 1.7–7.7)
Neutrophils Relative %: 6 %
Platelets: 234 10*3/uL (ref 150–400)
RBC: 2.36 MIL/uL — ABNORMAL LOW (ref 3.87–5.11)
RDW: 16.5 % — ABNORMAL HIGH (ref 11.5–15.5)
WBC: 75.1 10*3/uL (ref 4.0–10.5)
nRBC: 0 % (ref 0.0–0.2)
nRBC: 0 /100 WBC

## 2022-07-16 LAB — BASIC METABOLIC PANEL
Anion gap: 9 (ref 5–15)
BUN: 29 mg/dL — ABNORMAL HIGH (ref 8–23)
CO2: 22 mmol/L (ref 22–32)
Calcium: 9.1 mg/dL (ref 8.9–10.3)
Chloride: 109 mmol/L (ref 98–111)
Creatinine, Ser: 1.24 mg/dL — ABNORMAL HIGH (ref 0.44–1.00)
GFR, Estimated: 42 mL/min — ABNORMAL LOW (ref >60)
Glucose, Bld: 173 mg/dL — ABNORMAL HIGH (ref 70–99)
Potassium: 4.3 mmol/L (ref 3.5–5.1)
Sodium: 140 mmol/L (ref 135–145)

## 2022-07-16 LAB — URINALYSIS, COMPLETE (UACMP) WITH MICROSCOPIC
Bacteria, UA: NONE SEEN
Bilirubin Urine: NEGATIVE
Glucose, UA: NEGATIVE mg/dL
Hgb urine dipstick: NEGATIVE
Ketones, ur: NEGATIVE mg/dL
Leukocytes,Ua: NEGATIVE
Nitrite: NEGATIVE
Protein, ur: NEGATIVE mg/dL
Specific Gravity, Urine: 1.008 (ref 1.005–1.030)
Squamous Epithelial / HPF: NONE SEEN (ref 0–5)
pH: 5 (ref 5.0–8.0)

## 2022-07-16 LAB — HEPATIC FUNCTION PANEL
ALT: 13 U/L (ref 0–44)
AST: 28 U/L (ref 15–41)
Albumin: 4.3 g/dL (ref 3.5–5.0)
Alkaline Phosphatase: 102 U/L (ref 38–126)
Bilirubin, Direct: 0.2 mg/dL (ref 0.0–0.2)
Indirect Bilirubin: 0.8 mg/dL (ref 0.3–0.9)
Total Bilirubin: 1 mg/dL (ref 0.3–1.2)
Total Protein: 6.6 g/dL (ref 6.5–8.1)

## 2022-07-16 LAB — APTT: aPTT: 28 seconds (ref 24–36)

## 2022-07-16 LAB — SARS CORONAVIRUS 2 BY RT PCR: SARS Coronavirus 2 by RT PCR: NEGATIVE

## 2022-07-16 LAB — LACTIC ACID, PLASMA: Lactic Acid, Venous: 2.7 mmol/L (ref 0.5–1.9)

## 2022-07-16 LAB — MAGNESIUM: Magnesium: 2.3 mg/dL (ref 1.7–2.4)

## 2022-07-16 LAB — BRAIN NATRIURETIC PEPTIDE: B Natriuretic Peptide: 551.3 pg/mL — ABNORMAL HIGH (ref 0.0–100.0)

## 2022-07-16 LAB — D-DIMER, QUANTITATIVE: D-Dimer, Quant: 1.31 ug/mL-FEU — ABNORMAL HIGH (ref 0.00–0.50)

## 2022-07-16 MED ORDER — SODIUM CHLORIDE 0.9 % IV SOLN
2.0000 g | Freq: Once | INTRAVENOUS | Status: AC
  Administered 2022-07-16: 2 g via INTRAVENOUS
  Filled 2022-07-16: qty 12.5

## 2022-07-16 MED ORDER — SODIUM CHLORIDE 0.9 % IV SOLN
500.0000 mg | Freq: Once | INTRAVENOUS | Status: AC
  Administered 2022-07-16: 500 mg via INTRAVENOUS
  Filled 2022-07-16: qty 5

## 2022-07-16 MED ORDER — IOHEXOL 350 MG/ML SOLN
75.0000 mL | Freq: Once | INTRAVENOUS | Status: AC | PRN
  Administered 2022-07-16: 75 mL via INTRAVENOUS

## 2022-07-16 MED ORDER — FUROSEMIDE 10 MG/ML IJ SOLN
80.0000 mg | Freq: Once | INTRAMUSCULAR | Status: AC
  Administered 2022-07-16: 80 mg via INTRAVENOUS
  Filled 2022-07-16: qty 8

## 2022-07-16 MED ORDER — NITROGLYCERIN 0.4 MG SL SUBL: 0.4000 mg | SUBLINGUAL_TABLET | SUBLINGUAL | Status: DC | PRN

## 2022-07-16 NOTE — ED Notes (Signed)
Patient transported to CT 

## 2022-07-16 NOTE — Progress Notes (Signed)
Patient off bipap. Nasal cannula applied at [employer]. Patient's condition noted to be improved from initial presentation. Patient states feeling much better. Bipap remains on standby. Updated Dr Tamala Julian.

## 2022-07-16 NOTE — ED Notes (Signed)
Nitro Paste removed from pt's chest.

## 2022-07-16 NOTE — ED Triage Notes (Addendum)
Pt coming from home via [facility]. Per EMS, pt was starting to feel SOB and felt like she could not breath and called her family and they called EMS. Pt was initially 80% on room, EMS placed pt on 7.5L on CPAP, pt's oxygen saturation came up to 100%. EMS gave pt 1 inch of Nitro Paste.   Vitals with EMS:  BP: 180/70 CBG:212 O2: 100% on CPAP    Pt is currently SOB. Pt states she is having chest pain in the center of her chest. Pt describes pain as a pressure. Pt is currently A&Ox4. Pt states she has a hx of CHF.

## 2022-07-16 NOTE — Progress Notes (Signed)
PHARMACY -  BRIEF ANTIBIOTIC NOTE   Pharmacy has received consult(s) for Cefepime from an ED provider.  The patient's profile has been reviewed for ht/wt/allergies/indication/available labs.    One time order(s) placed for Cefepime 2g   Further antibiotics/pharmacy consults should be ordered by admitting physician if indicated.                       Thank you, Vira Blanco 07/16/2022  10:05 PM

## 2022-07-16 NOTE — ED Notes (Signed)
Pt denies any chest pain at this time but describes a pressure feeling in her chest.

## 2022-07-16 NOTE — ED Provider Notes (Signed)
[facility] Provider Note    Event Date/Time   First MD Initiated Contact with Patient 07/16/22 2000     (approximate)   History   Respiratory Distress   HPI  Lindsay [name] is a 88 y.o. female  with PMH of  HTN, CLL, nephrolithiasis s/p lithotripsy 2020, recurrent UTI, and CHF prescribed Lasix although patient did not take any for couple days who presents via EMS for evaluation of fairly rapid onset of shortness of breath today associate with some cough.  Patient endorses chest pain she describes as tightness.  No vomiting, diarrhea, urinary symptoms, back pain or abdominal pain.  She denies any recent tobacco use or illicit drug use.  Further history is limited from the patient secondary to respiratory stress on arrival.  Per EMS she had SPO2 of 80% on room air on arrival and was transported on CPAP with improvement in her work of breathing.      Physical Exam  Triage Vital Signs: ED Triage Vitals  Enc Vitals Group     BP      Pulse      Resp      Temp      Temp src      SpO2      Weight      Height      Head Circumference      Peak Flow      Pain Score      Pain Loc      Pain Edu?      Excl. in [place]?     Most recent vital signs: Vitals:   07/16/22 2315 07/16/22 2330  BP: (!) 137/42 (!) 138/49  Pulse: 76 88  Resp: 18 18  Temp:    SpO2: 97% 99%    General: Awake, somewhat ill-appearing. CV:  Good peripheral perfusion.  2+ radial pulse.  No audible murmur at this time.  Positive JVD Resp:  Increased respiratory effort with some rales at the bases.  Patient is tachypneic with accessory muscle use. Abd:  No distention.  Soft. Other:  No significant lower extreme edema.   ED Results / Procedures / Treatments  Labs (all labs ordered are listed, but only abnormal results are displayed) Labs Reviewed  CBC WITH DIFFERENTIAL/PLATELET - Abnormal; Notable for the following components:      Result Value   WBC 75.1 (*)    RBC 2.36 (*)     Hemoglobin 7.9 (*)    HCT 26.6 (*)    MCV 112.7 (*)    MCHC 29.7 (*)    RDW 16.5 (*)    Lymphs Abs 67.3 (*)    Monocytes Absolute 2.4 (*)    Eosinophils Absolute 1.4 (*)    Abs Immature Granulocytes 0.15 (*)    All other components within normal limits  BASIC METABOLIC PANEL - Abnormal; Notable for the following components:   Glucose, Bld 173 (*)    BUN 29 (*)    Creatinine, Ser 1.24 (*)    GFR, Estimated 42 (*)    All other components within normal limits  BRAIN NATRIURETIC PEPTIDE - Abnormal; Notable for the following components:   B Natriuretic Peptide 551.3 (*)    All other components within normal limits  LACTIC ACID, PLASMA - Abnormal; Notable for the following components:   Lactic Acid, Venous 2.7 (*)    All other components within normal limits  D-DIMER, QUANTITATIVE - Abnormal; Notable for the following components:   D-Dimer, Quant  1.31 (*)    All other components within normal limits  URINALYSIS, COMPLETE (UACMP) WITH MICROSCOPIC - Abnormal; Notable for the following components:   Color, Urine STRAW (*)    APPearance CLEAR (*)    All other components within normal limits  TROPONIN I (HIGH SENSITIVITY) - Abnormal; Notable for the following components:   Troponin I (High Sensitivity) 27 (*)    All other components within normal limits  TROPONIN I (HIGH SENSITIVITY) - Abnormal; Notable for the following components:   Troponin I (High Sensitivity) 45 (*)    All other components within normal limits  SARS CORONAVIRUS 2 BY RT PCR  CULTURE, BLOOD (ROUTINE X 2)  CULTURE, BLOOD (ROUTINE X 2)  EXPECTORATED SPUTUM ASSESSMENT W GRAM STAIN, RFLX TO RESP C  MAGNESIUM  PROTIME-INR  APTT  HEPATIC FUNCTION PANEL  BLOOD GAS, VENOUS  LACTIC ACID, PLASMA  PATHOLOGIST SMEAR REVIEW  BASIC METABOLIC PANEL  CBC     EKG  EKG is remarkable for sinus rhythm with a left bundle branch block and some fairly diffuse nonspecific ST changes throughout with a corrected QT interval of  501.   RADIOLOGY  Chest x-ray my interpretation shows bilateral pulmonary edema and left-sided pleural effusion without other clear focal consolidation, pneumothorax, or other clear acute process.  I reviewed radiologist rotation and agree to findings in addition to notation of aortic atherosclerosis.  CTA chest on my interpretation without evidence of a large PE or clear focal consolidation or overt edema.  There is small bilateral pleural effusions and atelectasis.  I agree with radiology interpretation of extensive lymphadenopathy in the thorax and aortic atherosclerosis.   PROCEDURES:  Critical Care performed: Yes, see critical care procedure note(s)  .Critical Care  Performed by: Lucrezia Starch, MD Authorized by: Lucrezia Starch, MD   Critical care provider statement:    Critical care time (minutes):  30   Critical care was necessary to treat or prevent imminent or life-threatening deterioration of the following conditions:  Respiratory failure   Critical care was time spent personally by me on the following activities:  Development of treatment plan with patient or surrogate, discussions with consultants, evaluation of patient's response to treatment, examination of patient, ordering and review of laboratory studies, ordering and review of radiographic studies, ordering and performing treatments and interventions, pulse oximetry, re-evaluation of patient's condition and review of old [place] ED: Medications  nitroGLYCERIN (NITROSTAT) SL tablet 0.4 mg (has no administration in time range)  lactated ringers bolus 500 mL (has no administration in time range)  amLODipine (NORVASC) tablet 5 mg (has no administration in time range)  furosemide (LASIX) injection 40 mg (has no administration in time range)  enoxaparin (LOVENOX) injection 40 mg (has no administration in time range)  acetaminophen (TYLENOL) tablet 650 mg (has no administration in time range)     Or  acetaminophen (TYLENOL) suppository 650 mg (has no administration in time range)  traZODone (DESYREL) tablet 25 mg (has no administration in time range)  magnesium hydroxide (MILK OF MAGNESIA) suspension 30 mL (has no administration in time range)  ondansetron (ZOFRAN) tablet 4 mg (has no administration in time range)    Or  ondansetron (ZOFRAN) injection 4 mg (has no administration in time range)  furosemide (LASIX) injection 80 mg (80 mg Intravenous Given 07/16/22 2110)  azithromycin (ZITHROMAX) 500 mg in sodium chloride 0.9 % 250 mL IVPB (500 mg Intravenous New Bag/Given 07/16/22 2312)  iohexol (OMNIPAQUE) 350  MG/ML injection 75 mL (75 mLs Intravenous Contrast Given 07/16/22 2349)  ceFEPIme (MAXIPIME) 2 g in sodium chloride 0.9 % 100 mL IVPB (0 g Intravenous Stopped 07/16/22 2346)     IMPRESSION / MDM / ASSESSMENT AND PLAN / ED COURSE  I reviewed the triage vital signs and the nursing notes. Patient's presentation is most consistent with acute presentation with potential threat to life or bodily function.                               Differential diagnosis includes, but is not limited to CHF exacerbation, bronchitis, pneumonia, pneumothorax, ACS, PE, anemia and arrhythmia as well as metabolic derangements.  On room air patient is fairly dyspneic but not hypoxic.  Will place on BiPAP after removing EMS CPAP for work of breathing.   EKG is remarkable for sinus rhythm with a left bundle branch block and some fairly diffuse nonspecific ST changes throughout with a corrected QT interval of 501.  Chest x-ray my interpretation shows bilateral pulmonary edema and left-sided pleural effusion without other clear focal consolidation, pneumothorax, or other clear acute process.  I reviewed radiologist rotation and agree to findings in addition to notation of aortic atherosclerosis.  COVID PCR negative.  CBC with hemoglobin of 7.9.  7.72 weeks ago and platelets of 234.  BMP remarkable for  creatinine of 1.24, to 0.972 weeks ago without any other significant lecture metabolic derangements.  Glucose is slightly elevated at 173.  Plan is slightly elevated at 27 compared to 98 2 months ago.  I suspect some mild demand ischemia in the setting of respiratory distress and suspect is likely acute CHF with lower suspicion for an occlusion MI at this time.  Magnesium is WNL INR 1.1.  D-dimer elevated at 1.31.  Hepatic function panel is unremarkable.  UA does not appear infected.  Repeat troponin slightly elevated at 45.  CTA chest on my interpretation without evidence of a large PE or clear focal consolidation or overt edema.  There is small bilateral pleural effusions and atelectasis.  I agree with radiology interpretation of extensive lymphadenopathy in the thorax and aortic atherosclerosis.   Reassessment after some Lasix patient is feeling much better.  She was transitioned off BiPAP to 2 L nasal cannula.  Her blood pressure has come down a little bit and given her slightly elevated lactic acid at 2.7 we will give very gentle fluid hydration back with 500 cc.  And concern for sepsis given her significant crease in her leukocytosis with tachypnea on arrival and cough and somewhat equivocal findings for atelectasis versus consolidation on her CT.  Primarily concern on arrival for borderline flash pulmonary edema and some CHF which seems to have gotten better.  She will be admitted to medicine service for further evaluation and management.     FINAL CLINICAL IMPRESSION(S) / ED DIAGNOSES   Final diagnoses:  SOB (shortness of breath)  Sepsis, due to unspecified organism, unspecified whether acute organ dysfunction present (HCC)  Elevated brain natriuretic peptide (BNP) level  Positive D dimer  Pleural effusion     Rx / DC Orders   ED Discharge Orders     None        Note:  This document was prepared using Dragon voice recognition software and may include unintentional dictation  errors.   Lucrezia Starch, MD 07/17/22 Benancio Deeds

## 2022-07-17 ENCOUNTER — Inpatient Hospital Stay: Payer: Medicare Other

## 2022-07-17 DIAGNOSIS — I700 Atherosclerosis of aorta: Secondary | ICD-10-CM | POA: Diagnosis present

## 2022-07-17 DIAGNOSIS — Z90710 Acquired absence of both cervix and uterus: Secondary | ICD-10-CM | POA: Diagnosis not present

## 2022-07-17 DIAGNOSIS — I110 Hypertensive heart disease with heart failure: Secondary | ICD-10-CM | POA: Diagnosis present

## 2022-07-17 DIAGNOSIS — I5043 Acute on chronic combined systolic (congestive) and diastolic (congestive) heart failure: Secondary | ICD-10-CM | POA: Diagnosis present

## 2022-07-17 DIAGNOSIS — I5023 Acute on chronic systolic (congestive) heart failure: Secondary | ICD-10-CM | POA: Diagnosis not present

## 2022-07-17 DIAGNOSIS — C9110 Chronic lymphocytic leukemia of B-cell type not having achieved remission: Secondary | ICD-10-CM

## 2022-07-17 DIAGNOSIS — I447 Left bundle-branch block, unspecified: Secondary | ICD-10-CM | POA: Diagnosis present

## 2022-07-17 DIAGNOSIS — R7989 Other specified abnormal findings of blood chemistry: Secondary | ICD-10-CM

## 2022-07-17 DIAGNOSIS — I10 Essential (primary) hypertension: Secondary | ICD-10-CM | POA: Diagnosis not present

## 2022-07-17 DIAGNOSIS — R0602 Shortness of breath: Secondary | ICD-10-CM | POA: Diagnosis present

## 2022-07-17 DIAGNOSIS — G2581 Restless legs syndrome: Secondary | ICD-10-CM | POA: Diagnosis present

## 2022-07-17 DIAGNOSIS — Z961 Presence of intraocular lens: Secondary | ICD-10-CM | POA: Diagnosis present

## 2022-07-17 DIAGNOSIS — E538 Deficiency of other specified B group vitamins: Secondary | ICD-10-CM | POA: Diagnosis present

## 2022-07-17 DIAGNOSIS — Z882 Allergy status to sulfonamides status: Secondary | ICD-10-CM | POA: Diagnosis not present

## 2022-07-17 DIAGNOSIS — Z87440 Personal history of urinary (tract) infections: Secondary | ICD-10-CM | POA: Diagnosis not present

## 2022-07-17 DIAGNOSIS — Z87442 Personal history of urinary calculi: Secondary | ICD-10-CM | POA: Diagnosis not present

## 2022-07-17 DIAGNOSIS — Z66 Do not resuscitate: Secondary | ICD-10-CM | POA: Diagnosis present

## 2022-07-17 DIAGNOSIS — K219 Gastro-esophageal reflux disease without esophagitis: Secondary | ICD-10-CM | POA: Diagnosis present

## 2022-07-17 DIAGNOSIS — M797 Fibromyalgia: Secondary | ICD-10-CM | POA: Diagnosis present

## 2022-07-17 DIAGNOSIS — J9601 Acute respiratory failure with hypoxia: Secondary | ICD-10-CM | POA: Diagnosis present

## 2022-07-17 DIAGNOSIS — M069 Rheumatoid arthritis, unspecified: Secondary | ICD-10-CM | POA: Diagnosis present

## 2022-07-17 DIAGNOSIS — Z9842 Cataract extraction status, left eye: Secondary | ICD-10-CM | POA: Diagnosis not present

## 2022-07-17 DIAGNOSIS — Z96642 Presence of left artificial hip joint: Secondary | ICD-10-CM | POA: Diagnosis present

## 2022-07-17 DIAGNOSIS — D539 Nutritional anemia, unspecified: Secondary | ICD-10-CM | POA: Diagnosis present

## 2022-07-17 DIAGNOSIS — Z20822 Contact with and (suspected) exposure to covid-19: Secondary | ICD-10-CM | POA: Diagnosis present

## 2022-07-17 DIAGNOSIS — I080 Rheumatic disorders of both mitral and aortic valves: Secondary | ICD-10-CM | POA: Diagnosis present

## 2022-07-17 DIAGNOSIS — Z888 Allergy status to other drugs, medicaments and biological substances status: Secondary | ICD-10-CM | POA: Diagnosis not present

## 2022-07-17 DIAGNOSIS — I248 Other forms of acute ischemic heart disease: Secondary | ICD-10-CM | POA: Diagnosis present

## 2022-07-17 LAB — BASIC METABOLIC PANEL
Anion gap: 8 (ref 5–15)
BUN: 29 mg/dL — ABNORMAL HIGH (ref 8–23)
CO2: 24 mmol/L (ref 22–32)
Calcium: 8.9 mg/dL (ref 8.9–10.3)
Chloride: 110 mmol/L (ref 98–111)
Creatinine, Ser: 0.92 mg/dL (ref 0.44–1.00)
GFR, Estimated: >60 mL/min (ref >60)
Glucose, Bld: 98 mg/dL (ref 70–99)
Potassium: 4 mmol/L (ref 3.5–5.1)
Sodium: 142 mmol/L (ref 135–145)

## 2022-07-17 LAB — CBC
HCT: 21.7 % — ABNORMAL LOW (ref 36.0–46.0)
Hemoglobin: 6.7 g/dL — ABNORMAL LOW (ref 12.0–15.0)
MCH: 33.3 pg (ref 26.0–34.0)
MCHC: 30.9 g/dL (ref 30.0–36.0)
MCV: 108 fL — ABNORMAL HIGH (ref 80.0–100.0)
Platelets: 170 10*3/uL (ref 150–400)
RBC: 2.01 MIL/uL — ABNORMAL LOW (ref 3.87–5.11)
RDW: 16.4 % — ABNORMAL HIGH (ref 11.5–15.5)
WBC: 37.7 10*3/uL — ABNORMAL HIGH (ref 4.0–10.5)
nRBC: 0 % (ref 0.0–0.2)

## 2022-07-17 LAB — BLOOD GAS, VENOUS
Acid-base deficit: 3.3 mmol/L — ABNORMAL HIGH (ref 0.0–2.0)
Bicarbonate: 23.2 mmol/L (ref 20.0–28.0)
Delivery systems: POSITIVE
O2 Saturation: 33.5 %
Patient temperature: 37
pCO2, Ven: 46 mmHg (ref 44–60)
pH, Ven: 7.31 (ref 7.25–7.43)
pO2, Ven: <31 mmHg — CL (ref 32–45)

## 2022-07-17 LAB — TROPONIN I (HIGH SENSITIVITY): Troponin I (High Sensitivity): 45 ng/L — ABNORMAL HIGH (ref <18)

## 2022-07-17 LAB — ABO/RH: ABO/RH(D): O POS

## 2022-07-17 LAB — PATHOLOGIST SMEAR REVIEW

## 2022-07-17 LAB — LACTIC ACID, PLASMA: Lactic Acid, Venous: 0.6 mmol/L (ref 0.5–1.9)

## 2022-07-17 MED ORDER — ACETAMINOPHEN 650 MG RE SUPP: 650.0000 mg | Freq: Four times a day (QID) | RECTAL | Status: DC | PRN

## 2022-07-17 MED ORDER — AMLODIPINE BESYLATE 5 MG PO TABS
5.0000 mg | ORAL_TABLET | Freq: Every day | ORAL | Status: DC
  Filled 2022-07-17: qty 1

## 2022-07-17 MED ORDER — SODIUM CHLORIDE 0.9 % IV SOLN: Freq: Once | INTRAVENOUS | Status: DC

## 2022-07-17 MED ORDER — ENOXAPARIN SODIUM 40 MG/0.4ML IJ SOSY
40.0000 mg | PREFILLED_SYRINGE | INTRAMUSCULAR | Status: DC
  Administered 2022-07-17: 40 mg via SUBCUTANEOUS
  Filled 2022-07-17: qty 0.4

## 2022-07-17 MED ORDER — FUROSEMIDE 10 MG/ML IJ SOLN: 40.0000 mg | Freq: Two times a day (BID) | INTRAMUSCULAR | Status: DC

## 2022-07-17 MED ORDER — FUROSEMIDE 10 MG/ML IJ SOLN
40.0000 mg | Freq: Two times a day (BID) | INTRAMUSCULAR | Status: AC
  Administered 2022-07-17 (×2): 40 mg via INTRAVENOUS
  Filled 2022-07-17 (×2): qty 4

## 2022-07-17 MED ORDER — HYDROCODONE-ACETAMINOPHEN 5-325 MG PO TABS
1.0000 | ORAL_TABLET | ORAL | Status: DC | PRN
  Administered 2022-07-17: 1 via ORAL
  Administered 2022-07-18: 2 via ORAL
  Filled 2022-07-17: qty 2
  Filled 2022-07-17: qty 1

## 2022-07-17 MED ORDER — ONDANSETRON HCL 4 MG/2ML IJ SOLN: 4.0000 mg | Freq: Four times a day (QID) | INTRAMUSCULAR | Status: DC | PRN

## 2022-07-17 MED ORDER — AMITRIPTYLINE HCL 10 MG PO TABS
20.0000 mg | ORAL_TABLET | Freq: Every day | ORAL | Status: DC
  Administered 2022-07-17 – 2022-07-18 (×2): 20 mg via ORAL
  Filled 2022-07-17 (×2): qty 2

## 2022-07-17 MED ORDER — MAGNESIUM HYDROXIDE 400 MG/5ML PO SUSP: 30.0000 mL | Freq: Every day | ORAL | Status: DC | PRN

## 2022-07-17 MED ORDER — LACTATED RINGERS IV BOLUS
500.0000 mL | Freq: Once | INTRAVENOUS | Status: AC
  Administered 2022-07-17: 500 mL via INTRAVENOUS

## 2022-07-17 MED ORDER — ONDANSETRON HCL 4 MG PO TABS: 4.0000 mg | ORAL_TABLET | Freq: Four times a day (QID) | ORAL | Status: DC | PRN

## 2022-07-17 MED ORDER — ACETAMINOPHEN 325 MG PO TABS: 650.0000 mg | ORAL_TABLET | Freq: Four times a day (QID) | ORAL | Status: DC | PRN

## 2022-07-17 MED ORDER — ENOXAPARIN SODIUM 30 MG/0.3ML IJ SOSY: 30.0000 mg | PREFILLED_SYRINGE | INTRAMUSCULAR | Status: DC

## 2022-07-17 MED ORDER — TRAZODONE HCL 50 MG PO TABS: 25.0000 mg | ORAL_TABLET | Freq: Every evening | ORAL | Status: DC | PRN

## 2022-07-17 NOTE — ED Notes (Signed)
Patient pulled up in bed. Set up with breakfast tray at this time

## 2022-07-17 NOTE — H&P (Addendum)
[place]   PATIENT NAME: Lindsay [name]    MR#:  [id number]  DATE OF BIRTH:  September 17, 1934  DATE OF ADMISSION:  07/16/2022  PRIMARY CARE PHYSICIAN: [name], MD   Patient is coming from: Home  REQUESTING/REFERRING PHYSICIAN: Hulan Saas, MD  CHIEF COMPLAINT:   Chief Complaint  Patient presents with   Respiratory Distress    HISTORY OF PRESENT ILLNESS:  Lindsay [name] is a 88 y.o. Caucasian female with medical history significant for combined systolic and diastolic CHF, CLL, electing not to be treated due to previous side effects with chemotherapy, fibromyalgia, GERD, nephrolithiasis s/p lithotripsy in 2020, recurrent UTI, hypertension, RLS and rheumatoid arthritis, who presented to the ER with acute onset of worsening dyspnea with associated cough that is mainly dry and occasionally productive of yellowish-white sputum as well as wheezing that got significantly worse since yesterday.  She denies any chest pain or palpitations.  No fever or chills.  No nausea or vomiting or abdominal pain.  No dysuria, oliguria or hematuria or flank pain.  No bleeding diathesis.  She was in significant distress with EMS she was placed on CPAP on route to the hospital as her pulse currently was 80% on room air and when she came to the ER that she was placed on BiPAP but was later tapered to nasal cannula and she was feeling more comfortable.  She stated that she has not taken her Lasix for the last couple of days.  ED Course: Upon arrival to the emergency room BP was 138/47 with otherwise normal vital signs.  Labs revealed a blood glucose of 173 with a BUN of 29 creatinine 1.24 above previous levels 2 weeks ago.  BNP was 551 and high-sensitivity troponin I was 27 and later 45.  Lactic acid was 2.7 and CBC showed leukocytosis 75.1 compared to 34.2 on 06/30/2022, with left cytosis of 67.3.  D-dimer was 1.31 and otherwise coagulation profile was within normal. EKG as reviewed by me : EKG  showed sinus rhythm with a rate of 88 with left bundle branch block that is old. Imaging: Portable chest x-ray showed mild pulmonary edema with new trace left pleural effusion and aortic atherosclerosis.  Chest CTA revealed the following: No evidence of pulmonary embolism.   Small pleural effusions with bibasilar atelectasis.   Extensive lymphadenopathy in the thorax similar to that seen on the prior exam consistent with the known history of CLL.   Aortic Atherosclerosis.  The patient was given IV cefepime and Zithromax as well as 80 mg of IV Lasix.  She will be admitted to a progressive unit bed for further evaluation and management.  PAST MEDICAL HISTORY:   Past Medical History:  Diagnosis Date   Arthritis    RHEUMATOID   CHF (congestive heart failure) (HCC)    CLL (chronic lymphocytic leukemia) (HCC)    Edema    MILD ANKLE OCCAS   Fibromyalgia    GERD (gastroesophageal reflux disease)    Hypertension    Kidney stones    RLS (restless legs syndrome)     PAST SURGICAL HISTORY:   Past Surgical History:  Procedure Laterality Date   ABDOMINAL HYSTERECTOMY     CATARACT EXTRACTION W/PHACO Left 02/07/2016   Procedure: CATARACT EXTRACTION PHACO AND INTRAOCULAR LENS PLACEMENT ([place]);  Surgeon: Estill Cotta, MD;  Location: [facility] ORS;  Service: Ophthalmology;  Laterality: Left;  [place] 01:33 AP% 24.7 CDE 39.57 fluid pack lot # [id number]   EXTRACORPOREAL SHOCK WAVE LITHOTRIPSY Right  07/03/2019   Procedure: EXTRACORPOREAL SHOCK WAVE LITHOTRIPSY (ESWL);  Surgeon: Royston Cowper, MD;  Location: [facility] ORS;  Service: Urology;  Laterality: Right;   EYE SURGERY     HIP ARTHROPLASTY Left 04/20/2020   Procedure: ARTHROPLASTY BIPOLAR HIP (HEMIARTHROPLASTY);  Surgeon: Hessie Knows, MD;  Location: [facility] ORS;  Service: Orthopedics;  Laterality: Left;   LITHOTRIPSY     TONSILLECTOMY      SOCIAL HISTORY:   Social History   Tobacco Use   Smoking status: Never   Smokeless tobacco: Never   Substance Use Topics   Alcohol use: No    FAMILY HISTORY:   Family History  Problem Relation Age of Onset   Cancer Mother        uterine cancer    DRUG ALLERGIES:   Allergies  Allergen Reactions   Demerol [Meperidine]    Septra [Sulfamethoxazole-Trimethoprim]     REVIEW OF SYSTEMS:   ROS As per history of present illness. All pertinent systems were reviewed above. Constitutional, HEENT, cardiovascular, respiratory, GI, GU, musculoskeletal, neuro, psychiatric, endocrine, integumentary and hematologic systems were reviewed and are otherwise negative/unremarkable except for positive findings mentioned above in the HPI.   MEDICATIONS AT HOME:   Prior to Admission medications   Medication Sig Start Date End Date Taking? Authorizing Provider  amitriptyline (ELAVIL) 10 MG tablet Take 20 mg by mouth at bedtime.   Yes [provider]  amLODipine (NORVASC) 5 MG tablet Take 5 mg by mouth daily. 02/20/22  Yes [provider]  cyanocobalamin (,VITAMIN B-12,) 1000 MCG/ML injection Inject into the muscle every 30 (thirty) days. 07/04/21  Yes [provider]  feeding supplement, ENSURE ENLIVE, (ENSURE ENLIVE) LIQD Take 237 mLs by mouth 2 (two) times daily between meals. 04/22/20  Yes Swayze, Ava, DO  HYDROcodone-acetaminophen (NORCO/VICODIN) 5-325 MG tablet Take 1-2 tablets by mouth every 4 (four) hours as needed for moderate pain (pain score 4-6). Patient taking differently: Take 1-2 tablets by mouth every 4 (four) hours as needed for moderate pain (pain score 4-6). Takes ~twice daily for arthritis 04/22/20  Yes Duanne Guess, PA-C  Multiple Vitamin (MULTIVITAMIN WITH MINERALS) TABS tablet Take 1 tablet by mouth daily. 04/23/20  Yes Swayze, Ava, DO  furosemide (LASIX) 20 MG tablet Take 1 tablet (20 mg total) by mouth daily. Patient not taking: Reported on 06/30/2022 05/16/22 05/16/23  Wouk, Ailene Rud, MD      VITAL SIGNS:  Blood pressure (!) 132/51, pulse 73,  temperature 98.1 F (36.7 C), temperature source Oral, resp. rate 20, height '5\' 2"'$  (1.575 m), weight 47.2 kg, SpO2 98 %.  PHYSICAL EXAMINATION:  Physical Exam  GENERAL:  88 y.o.-year-old Caucasian female patient lying in the bed with no acute distress.  EYES: Pupils equal, round, reactive to light and accommodation. No scleral icterus. Extraocular muscles intact.  HEENT: Head atraumatic, normocephalic. Oropharynx and nasopharynx clear.  NECK:  Supple, no jugular venous distention. No thyroid enlargement, no tenderness.  LUNGS: Diminished bibasilar breath sounds with bibasal rales.. No use of accessory muscles of respiration.  CARDIOVASCULAR: Regular rate and rhythm, S1, S2 normal. No murmurs, rubs, or gallops.  ABDOMEN: Soft, nondistended, nontender. Bowel sounds present. No organomegaly or mass.  EXTREMITIES: No pedal edema, cyanosis, or clubbing.  NEUROLOGIC: Cranial nerves II through XII are intact. Muscle strength 5/5 in all extremities. Sensation intact. Gait not checked.  PSYCHIATRIC: The patient is alert and oriented x 3.  Normal affect and good eye contact. SKIN: No obvious rash, lesion, or ulcer.  LABORATORY PANEL:   CBC Recent Labs  Lab 07/16/22 2014  WBC 75.1*  HGB 7.9*  HCT 26.6*  PLT 234   ------------------------------------------------------------------------------------------------------------------  Chemistries  Recent Labs  Lab 07/16/22 2014  NA 140  K 4.3  CL 109  CO2 22  GLUCOSE 173*  BUN 29*  CREATININE 1.24*  CALCIUM 9.1  MG 2.3  AST 28  ALT 13  ALKPHOS 102  BILITOT 1.0   ------------------------------------------------------------------------------------------------------------------  Cardiac Enzymes No results for input(s): "TROPONINI" in the last 168 hours. ------------------------------------------------------------------------------------------------------------------  RADIOLOGY:  CT Angio Chest PE W and/or Wo Contrast  Result  Date: 07/17/2022 CLINICAL DATA:  History of CLL with shortness of breath EXAM: CT ANGIOGRAPHY CHEST WITH CONTRAST TECHNIQUE: Multidetector CT imaging of the chest was performed using the standard protocol during bolus administration of intravenous contrast. Multiplanar CT image reconstructions and MIPs were obtained to evaluate the vascular anatomy. RADIATION DOSE REDUCTION: This exam was performed according to the departmental dose-optimization program which includes automated exposure control, adjustment of the mA and/or kV according to patient size and/or use of iterative reconstruction technique. CONTRAST:  24m OMNIPAQUE IOHEXOL 350 MG/ML SOLN COMPARISON:  Chest x-ray from earlier in the same day, CT from 05/15/2022 FINDINGS: Cardiovascular: Atherosclerotic calcifications of the thoracic aorta are noted. No aneurysmal dilatation is seen. No dissection is noted. Heart is at the upper limits of normal in size. The pulmonary artery shows a normal branching pattern bilaterally. No intraluminal filling defect to suggest pulmonary embolism is noted. Mild coronary calcifications are noted. Mediastinum/Nodes: Thoracic inlet demonstrates extensive lymphadenopathy similar to that seen on prior exam. Mediastinal and hilar adenopathy is noted also stable from the recent exam. Extensive axillary adenopathy is noted similar to that noted on the prior exam. These findings are consistent with the given clinical history of CLL. The esophagus is within normal limits. Lungs/Pleura: Small bilateral pleural effusions are again identified. Increasing basilar atelectasis is noted bilaterally. No sizable parenchymal nodules are noted. Upper Abdomen: Spleen is prominent consistent with the known history. Musculoskeletal: No chest wall abnormality. No acute or significant osseous findings. Review of the MIP images confirms the above findings. IMPRESSION: No evidence of pulmonary embolism. Small pleural effusions with bibasilar  atelectasis. Extensive lymphadenopathy in the thorax similar to that seen on the prior exam consistent with the known history of CLL. Aortic Atherosclerosis (ICD10-I70.0). Electronically Signed   By: MInez CatalinaM.D.   On: 07/17/2022 00:11   DG Chest Port 1 View  Result Date: 07/16/2022 CLINICAL DATA:  Shortness of breath. EXAM: PORTABLE CHEST 1 VIEW COMPARISON:  Chest radiograph 06/30/2022; CT angio chest 05/15/2022. FINDINGS: Of note, the patient is moderately rotated on this portable examination. Borderline enlarged cardiac silhouette. Aortic calcifications. Diffuse hazy interstitial thickening, most notable in the mid and lower lungs. New trace left pleural effusion. No pneumothorax. No acute osseous abnormality. IMPRESSION: Mild pulmonary edema with new trace left pleural effusion. Aortic Atherosclerosis (ICD10-I70.0). Electronically Signed   By: LIleana RoupM.D.   On: 07/16/2022 20:42      IMPRESSION AND PLAN:  Assessment and Plan: * Acute on chronic systolic CHF (congestive heart failure) ([place] - The patient will be admitted to a PCU bed. - We will continue diuresis with IV Lasix. - We will follow serial troponins specially given slightly elevated troponin.  She has no chest pain. - Cardiology consult will be obtained. - I notified Dr. PSaralyn Pilarabout the patient. - She had a 2D echo on 05/30/2022 revealing an EF of  45 to 50% with grade 1 diastolic dysfunction, mild to moderate mitral regurgitation, aortic valve moderate regurgitation and mild to moderate aortic valve stenosis.  Acute respiratory failure with hypoxia (HCC) - This is clearly secondary to #1. - O2 protocol will be followed. - Management otherwise as above. - She initially required CPAP then BiPAP and is currently on nasal cannula.  CLL (chronic lymphocytic leukemia) ([place]) - She may be having an active flare given significant worsening of her leukocytosis.  She could be having stress demargination from her acute CHF and  respiratory failure. - The patient has elected not to have chemotherapy. - If need be oncology consult can be obtained.  The morning hospitalist discretion while following her.  Elevated d-dimer - The patient was ruled out PE. - We will obtain bilateral lower extremity venous Doppler. - This could be associated with her CLL  Vitamin B12 deficiency - We will continue vitamin B12.  Essential hypertension - We will continue Norvasc.    DVT prophylaxis: Lovenox.  Advanced Care Planning:  Code Status: DNR/DNI.  This was discussed with her. Family Communication:  The plan of care was discussed in details with the patient (and family). I answered all questions. The patient agreed to proceed with the above mentioned plan. Further management will depend upon hospital course. Disposition Plan: Back to previous home environment Consults called: Cardiology. All the records are reviewed and case discussed with ED provider.  Status is: Inpatient    At the time of the admission, it appears that the appropriate admission status for this patient is inpatient.  This is judged to be reasonable and necessary in order to provide the required intensity of service to ensure the patient's safety given the presenting symptoms, physical exam findings and initial radiographic and laboratory data in the context of comorbid conditions.  The patient requires inpatient status due to high intensity of service, high risk of further deterioration and high frequency of surveillance required.  I certify that at the time of admission, it is my clinical judgment that the patient will require inpatient hospital care extending more than 2 midnights.                            Dispo: The patient is from: Home              Anticipated d/c is to: Home              Patient currently is not medically stable to d/c.              Difficult to place patient: No  Christel Mormon M.D on 07/17/2022 at 1:45 AM  [facility]    From 7 PM-7 AM, contact night-coverage [email or web address]  CC: Primary care physician; [name], MD

## 2022-07-17 NOTE — Assessment & Plan Note (Signed)
-   We will continue vitamin B12. 

## 2022-07-17 NOTE — Consult Note (Signed)
   Heart Failure Nurse Navigator Note  HFmrEF 45 to 50%.  Global hypokinesis.  Grade 1 diastolic dysfunction.  Mild to moderate mitral regurgitation.  Mild to moderate aortic stenosis.  She presented from home with complaints of worsening shortness of breath and ankle swelling.  Comorbidities:  CLL Fibromyalgia Hypertension RLS Rheumatoid arthritis  Medications:  Amlodipine 5 mg daily Furosemide 40 mg IV twice a day  Labs:  BNP 551, sodium 142, potassium 4, chloride 110, CO2 24, BUN 29, creatinine 0.92, hemoglobin 6.7, hematocrit 21.7. Weight 47.2 kg Blood pressure 138/48  Met with patient and the daughter that she lives with.  Patient states at home she was taking the lasix based on her symptoms- which was increasing shortness of breath with exertion and ankle swelling.  She states that they had not noted any difference in her weights.  The daughter did not feel that she drank anywhere near 64 ounces in a days time and they maintain a low-sodium diet.  Discussed follow-up in the outpatient heart failure clinic.  She has an appointment on August 28 at 1:30 in the afternoon.  Also discussed with changes in her symptom or weight increases to notify Otila Kluver in the heart failure clinic.  They voiced understanding.  Pricilla Riffle RN [facility]

## 2022-07-17 NOTE — Consult Note (Cosign Needed)
[place] NOTE       Patient ID: Lindsay [name] MRN: [id number] DOB/AGE: 86-Nov-1935 86 y.o.  Admit date: 07/16/2022 Referring Physician Dr. Eugenie Norrie Primary Physician Dr. 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  She was in respiratory distress and hypoxic to the 80s with EMS and was placed on CPAP, eventually BiPAP in the ED, but now weaned to nasal cannula.  Cardiology is consulted for assistance with her heart failure.  She presents with her daughter who contributes to the history.  She was seen in her oncologist office 2 weeks ago where she was told to take her Lasix only as needed.  She has taken it several times over the past couple weeks for some swelling in her ankles although she noted her weight has been stable.  She is very active and functional despite her age, living with her daughter but can vacuum and mop the floors in the home independently.  She has noticed occasional worsening of shortness of breath when she was vacuuming, and yesterday in the shower she became very short of breath and was coughing up whitish-yellow sputum.  She said her ankles have been swollen but this is improved since she was given Lasix in the ED.  She has denied any chest discomfort, palpitations, dizziness, orthopnea.  Vitals are notable for a blood pressure of 137/47, heart rate 69 in sinus rhythm on telemetry.  SPO2 98% on 2 L by nasal cannula.  Labs are notable for a potassium of 4.3, BUN/creatinine 29/1.24, GFR 42, improving after Lasix to 0.92 and  GFR greater than 60.  BNP elevated at 551, high-sensitivity troponin minimally elevated with a flat trend at 27-45.  CBC with significant leukocytosis on presentation to 75, downtrending to 37.7.  Hemoglobin also with significant drop overnight from 7.9-6.7.  Platelets 170 D-dimer was elevated to 1.31, but CT angio chest was negative for pulmonary embolus.  Chest x-ray with mild pulmonary edema and new trace left pleural effusion.  Review of systems complete and found to be negative unless listed above     Past Medical History:  Diagnosis Date   Arthritis    RHEUMATOID   CHF (congestive heart failure) (HCC)    CLL (chronic lymphocytic leukemia) (HCC)    Edema    MILD ANKLE OCCAS   Fibromyalgia    GERD (gastroesophageal reflux disease)    Hypertension    Kidney stones    RLS (restless legs syndrome)     Past Surgical History:  Procedure Laterality Date   ABDOMINAL HYSTERECTOMY     CATARACT EXTRACTION W/PHACO Left 02/07/2016   Procedure: CATARACT EXTRACTION PHACO AND INTRAOCULAR LENS PLACEMENT ([place]);  Surgeon: Estill Cotta, MD;  Location: [facility] ORS;  Service: Ophthalmology;  Laterality: Left;  [place] 01:33 AP% 24.7 CDE 39.57 fluid pack lot # [id number]   EXTRACORPOREAL SHOCK WAVE LITHOTRIPSY Right 07/03/2019   Procedure: EXTRACORPOREAL SHOCK WAVE LITHOTRIPSY (ESWL);  Surgeon: Royston Cowper, MD;  Location: [facility] ORS;  Service: Urology;  Laterality: Right;   EYE SURGERY     HIP ARTHROPLASTY Left 04/20/2020   Procedure: ARTHROPLASTY BIPOLAR HIP (HEMIARTHROPLASTY);  Surgeon: Hessie Knows, MD;  Location: [facility] ORS;  Service: Orthopedics;  Laterality: Left;   LITHOTRIPSY     TONSILLECTOMY      (Not in a hospital admission)  Social History   Socioeconomic History   Marital status: Widowed    Spouse name: Not on file   Number of children: Not on file   Years of education: Not on file   Highest education level: Not on file  Occupational History   Not on file  Tobacco Use   Smoking  status: Never   Smokeless tobacco: Never  Substance and Sexual Activity   Alcohol use: No   Drug use: Never   Sexual activity: Not on file  Other Topics Concern   Not on file  Social History Narrative   Lives with daughter, in [place]. Remote smoking; no alcohol. 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    Social Determinants of Health   Financial Resource Strain: Not on file  Food Insecurity: Not on file  Transportation Needs: Not on file  Physical Activity: Not on file  Stress: Not on file  Social Connections: Not on file  Intimate Partner Violence: Not on file    Family History  Problem Relation Age of Onset   Cancer Mother        uterine cancer      PHYSICAL EXAM General: Very pleasant elderly Caucasian female, well nourished, in no acute distress.  Sitting upright in ED stretcher with daughter at bedside HEENT:  Normocephalic and atraumatic. Neck:  No JVD.  Lungs: Normal respiratory effort on 2 L by nasal cannula.  Bibasilar crackles  heart: HRRR . Normal S1 and S2 without 2/6 systolic murmur murmurs. Radial & DP pulses 2+ bilaterally. Abdomen: Non-distended appearing.  Msk: Normal strength and tone for age. Extremities: Warm and well perfused. No clubbing, cyanosis.  Trace bilateral lower leg edema Neuro: Alert and oriented X 3. Psych:  Answers questions appropriately.   Labs:   Lab Results  Component Value Date   WBC 37.7 (H) 07/17/2022   HGB 6.7 (L) 07/17/2022   HCT 21.7 (L) 07/17/2022   MCV 108.0 (H) 07/17/2022   PLT 170 07/17/2022    Recent Labs  Lab 07/16/22 2014 07/17/22 0523  NA 140 142  K 4.3 4.0  CL 109 110  CO2 22 24  BUN 29* 29*  CREATININE 1.24* 0.92  CALCIUM 9.1 8.9  PROT 6.6  --   BILITOT 1.0  --   ALKPHOS 102  --   ALT 13  --   AST 28  --   GLUCOSE 173* 98   Lab Results  Component Value Date   TROPONINI < 0.02 03/06/2014   No results found for: "CHOL" No results found for: "HDL" No results found for: "LDLCALC" No results found for:  "TRIG" No results found for: "CHOLHDL" No results found for: "LDLDIRECT"    Radiology: US Venous Img Lower Bilateral (DVT)  Result Date: 07/17/2022 CLINICAL DATA:  Positive D-dimer EXAM: BILATERAL LOWER EXTREMITY VENOUS DOPPLER ULTRASOUND TECHNIQUE: Gray-scale sonography with compression, as well as color and duplex ultrasound, were performed to evaluate the deep venous system(s) from the level of the common femoral vein through the popliteal and proximal calf veins. COMPARISON:  None Available. FINDINGS: VENOUS Normal compressibility of the common femoral, superficial femoral, and popliteal veins, as well as the visualized calf veins. Visualized portions of profunda femoral vein and great saphenous vein unremarkable. No filling defects to suggest DVT on grayscale or color Doppler imaging. Doppler waveforms show  normal direction of venous flow, normal respiratory plasticity and response to augmentation. IMPRESSION: Negative for DVT in the lower extremities. Electronically Signed   By: Jorje Guild M.D.   On: 07/17/2022 06:44   CT Angio Chest PE W and/or Wo Contrast  Result Date: 07/17/2022 CLINICAL DATA:  History of CLL with shortness of breath EXAM: CT ANGIOGRAPHY CHEST WITH CONTRAST TECHNIQUE: Multidetector CT imaging of the chest was performed using the standard protocol during bolus administration of intravenous contrast. Multiplanar CT image reconstructions and MIPs were obtained to evaluate the vascular anatomy. RADIATION DOSE REDUCTION: This exam was performed according to the departmental dose-optimization program which includes automated exposure control, adjustment of the mA and/or kV according to patient size and/or use of iterative reconstruction technique. CONTRAST:  74m OMNIPAQUE IOHEXOL 350 MG/ML SOLN COMPARISON:  Chest x-ray from earlier in the same day, CT from 05/15/2022 FINDINGS: Cardiovascular: Atherosclerotic calcifications of the thoracic aorta are noted. No aneurysmal dilatation  is seen. No dissection is noted. Heart is at the upper limits of normal in size. The pulmonary artery shows a normal branching pattern bilaterally. No intraluminal filling defect to suggest pulmonary embolism is noted. Mild coronary calcifications are noted. Mediastinum/Nodes: Thoracic inlet demonstrates extensive lymphadenopathy similar to that seen on prior exam. Mediastinal and hilar adenopathy is noted also stable from the recent exam. Extensive axillary adenopathy is noted similar to that noted on the prior exam. These findings are consistent with the given clinical history of CLL. The esophagus is within normal limits. Lungs/Pleura: Small bilateral pleural effusions are again identified. Increasing basilar atelectasis is noted bilaterally. No sizable parenchymal nodules are noted. Upper Abdomen: Spleen is prominent consistent with the known history. Musculoskeletal: No chest wall abnormality. No acute or significant osseous findings. Review of the MIP images confirms the above findings. IMPRESSION: No evidence of pulmonary embolism. Small pleural effusions with bibasilar atelectasis. Extensive lymphadenopathy in the thorax similar to that seen on the prior exam consistent with the known history of CLL. Aortic Atherosclerosis (ICD10-I70.0). Electronically Signed   By: MInez CatalinaM.D.   On: 07/17/2022 00:11   DG Chest Port 1 View  Result Date: 07/16/2022 CLINICAL DATA:  Shortness of breath. EXAM: PORTABLE CHEST 1 VIEW COMPARISON:  Chest radiograph 06/30/2022; CT angio chest 05/15/2022. FINDINGS: Of note, the patient is moderately rotated on this portable examination. Borderline enlarged cardiac silhouette. Aortic calcifications. Diffuse hazy interstitial thickening, most notable in the mid and lower lungs. New trace left pleural effusion. No pneumothorax. No acute osseous abnormality. IMPRESSION: Mild pulmonary edema with new trace left pleural effusion. Aortic Atherosclerosis (ICD10-I70.0). Electronically  Signed   By: LIleana RoupM.D.   On: 07/16/2022 20:42   DG Chest 2 View  Result Date: 06/30/2022 CLINICAL DATA:  Provided history: Cough, congestion, shortness of breath, history of CHF. EXAM: CHEST - 2 VIEW COMPARISON:  CT angiogram chest 05/15/2022. Chest radiograph 05/15/2022. FINDINGS: Size at the upper limits of normal. Aortic atherosclerosis. Interstitial predominant opacities within the bilateral lung bases. No evidence of pleural effusion or pneumothorax. No acute bony abnormality identified. Dextrocurvature of the thoracic spine. IMPRESSION: Interstitial predominant opacities within the bilateral lung bases. Findings may reflect interstitial edema. However, correlate clinically to exclude signs/symptoms of an atypical/viral infection (which can have a similar imaging appearance). Aortic Atherosclerosis (ICD10-I70.0). Electronically Signed   By: KKellie SimmeringD.O.   On: 06/30/2022 10:44    ECHO 05/2022  1. Left ventricular ejection fraction, by estimation, is 45 to 50%. The  left ventricle  has mildly decreased function. The left ventricle  demonstrates global hypokinesis. Left ventricular diastolic parameters are  consistent with Grade I diastolic  dysfunction (impaired relaxation). Elevated left atrial pressure. The  average left ventricular global longitudinal strain is -9.7 %. The global  longitudinal strain is abnormal.   2. Right ventricular systolic function is normal. The right ventricular  size is normal. There is normal pulmonary artery systolic pressure.   3. The mitral valve is abnormal. Mild to moderate mitral valve  regurgitation. No evidence of mitral stenosis.   4. The aortic valve has an indeterminant number of cusps. There is  moderate thickening of the aortic valve. Aortic valve regurgitation is  moderate. Mild to moderate aortic valve stenosis.   5. The inferior vena cava is normal in size with <50% respiratory  variability, suggesting right atrial pressure of 8 mmHg.    FINDINGS   Left Ventricle: Left ventricular ejection fraction, by estimation, is 45  to 50%. The left ventricle has mildly decreased function. The left  ventricle demonstrates global hypokinesis. The average left ventricular  global longitudinal strain is -9.7 %. The   global longitudinal strain is abnormal. The left ventricular internal  cavity size was normal in size. There is no left ventricular hypertrophy.  Abnormal (paradoxical) septal motion, consistent with left bundle branch  block. Left ventricular diastolic  parameters are consistent with Grade I diastolic dysfunction (impaired  relaxation). Elevated left atrial pressure.   Right Ventricle: The right ventricular size is normal. No increase in  right ventricular wall thickness. Right ventricular systolic function is  normal. There is normal pulmonary artery systolic pressure. The tricuspid  regurgitant velocity is 2.00 m/s, and   with an assumed right atrial pressure of 8 mmHg, the estimated right  ventricular systolic pressure is 15.1 mmHg.   Left Atrium: Left atrial size was normal in size.   Right Atrium: Right atrial size was normal in size.   Pericardium: There is no evidence of pericardial effusion.   Mitral Valve: The mitral valve is abnormal. There is mild thickening of  the mitral valve leaflet(s). Mild to moderate mitral valve regurgitation.  No evidence of mitral valve stenosis. MV peak gradient, 8.1 mmHg. The mean  mitral valve gradient is 4.0  mmHg.   Tricuspid Valve: The tricuspid valve is normal in structure. Tricuspid  valve regurgitation is mild.   Aortic Valve: The aortic valve has an indeterminant number of cusps. There  is moderate thickening of the aortic valve. Aortic valve regurgitation is  moderate. Aortic regurgitation PHT measures 472 msec. Mild to moderate  aortic stenosis is present. Aortic   valve mean gradient measures 13.7 mmHg. Aortic valve peak gradient  measures 22.7 mmHg. Aortic  valve area, by VTI measures 1.17 cm.   Pulmonic Valve: The pulmonic valve was not well visualized. Pulmonic valve  regurgitation is not visualized. No evidence of pulmonic stenosis.   Aorta: The aortic root is normal in size and structure.   Pulmonary Artery: The pulmonary artery is not well seen.   Venous: The inferior vena cava is normal in size with less than 50%  respiratory variability, suggesting right atrial pressure of 8 mmHg.   IAS/Shunts: The interatrial septum was not well visualized.      LEFT VENTRICLE  PLAX 2D  LVIDd:         4.80 cm      Diastology  LVIDs:         3.80 cm      LV  e' medial:    4.35 cm/s  LV PW:         0.90 cm      LV E/e' medial:  25.1  LV IVS:        0.70 cm      LV e' lateral:   5.11 cm/s  LVOT diam:     2.00 cm      LV E/e' lateral: 21.3  LV SV:         64  LV SV Index:   43           2D Longitudinal Strain  LVOT Area:     3.14 cm     2D Strain GLS Avg:     -9.7 %     LV Volumes (MOD)  LV vol d, MOD A2C: 79.1 ml  3D Volume EF:  LV vol d, MOD A4C: 107.0 ml 3D EF:        54 %  LV vol s, MOD A2C: 71.5 ml  LV EDV:       158 ml  LV vol s, MOD A4C: 47.8 ml  LV ESV:       73 ml  LV SV MOD A2C:     7.6 ml   LV SV:        84 ml  LV SV MOD A4C:     107.0 ml  LV SV MOD BP:      31.2 ml   RIGHT VENTRICLE  RV Basal diam:  2.60 cm  RV S prime:     14.80 cm/s  TAPSE (M-mode): 2.5 cm   LEFT ATRIUM             Index        RIGHT ATRIUM           Index  LA diam:        4.00 cm 2.70 cm/m   RA Area:     11.50 cm  LA Vol (A2C):   52.1 ml 35.14 ml/m  RA Volume:   24.00 ml  16.19 ml/m  LA Vol (A4C):   36.0 ml 24.28 ml/m  LA Biplane Vol: 43.5 ml 29.34 ml/m   AORTIC VALVE                     PULMONIC VALVE  AV Area (Vmax):    1.06 cm      PV Vmax:        1.08 m/s  AV Area (Vmean):   0.98 cm      PV Vmean:       72.800 cm/s  AV Area (VTI):     1.17 cm      PV VTI:         0.240 m  AV Vmax:           238.00 cm/s   PV Peak grad:   4.7 mmHg  AV  Vmean:          173.333 cm/s  PV Mean grad:   2.0 mmHg  AV VTI:            0.546 m       RVOT Peak grad: 3 mmHg  AV Peak Grad:      22.7 mmHg  AV Mean Grad:      13.7 mmHg  LVOT Vmax:         80.30 cm/s  LVOT Vmean:        54.000 cm/s  LVOT VTI:  0.204 m  LVOT/AV VTI ratio: 0.37  AI PHT:            472 msec     AORTA  Ao Root diam: 2.50 cm   MITRAL VALVE                TRICUSPID VALVE  MV Area (PHT): 2.32 cm     TR Peak grad:   16.0 mmHg  MV Area VTI:   1.36 cm     TR Vmax:        200.00 cm/s  MV Peak grad:  8.1 mmHg  MV Mean grad:  4.0 mmHg     SHUNTS  MV Vmax:       1.42 m/s     Systemic VTI:  0.20 m  MV Vmean:      100.0 cm/s   Systemic Diam: 2.00 cm  MV Decel Time: 327 msec     Pulmonic VTI:  0.146 m  MV E velocity: 109.00 cm/s  MV A velocity: 130.00 cm/s  MV E/A ratio:  0.84   Christopher End MD  Electronically signed by Nelva Bush MD  Signature Date/Time: 05/30/2022/6:09:41 PM   TELEMETRY reviewed by me: Sinus rhythm rate 70s to 80s  EKG reviewed by me: Sinus rhythm LBBB rate 88, overall similar lower to priors dating back to 2015.  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  She was in respiratory distress and hypoxic to the 80s with EMS and was placed on CPAP, eventually BiPAP in the ED, but now weaned to nasal cannula.  Cardiology is consulted for assistance with her heart failure.  #Acute on chronicHFmrEF (LVEF 45-50%, global hypo, g1dd 05/2022) #Acute hypoxic respiratory failure #Elevated troponin The patient presents with worsening leg swelling and occasional shortness of breath since her Lasix was  changed to as needed dosing 2 weeks ago.  She was in respiratory distress and hypoxic to the 80s, requiring BiPAP in the ED, now weaned to 2 L by nasal cannula and feeling much better.  Her BNP is elevated to the 500s and she has vascular congestion and a trace left pleural effusion on chest x-ray, somewhat clinically volume overloaded appearing.  Her troponin is minimally elevated at 27-45, likely representing demand ischemia and not ACS.  -Agree with current therapy per primary team -S/p 80 mg of IV Lasix with clinical improvement, continue 40 mg IV Lasix twice daily x2 doses -Defer repeat echo as patient had a recent study 1 month ago -Hold amlodipine 5 mg, likely initiate low-dose metoprolol XL instead for GDMT as her blood pressure allows. -Strict I's/O, daily weights, salt and fluid restriction  This patient's plan of care was discussed and created with Dr. Nehemiah Massed and he is in agreement.  Signed: Tristan Schroeder , PA-C 07/17/2022, 9:37 AM [facility]  The patient has known moderate aortic valve stenosis and mild LV systolic dysfunction with what appears to be acute on chronic systolic dysfunction congestive heart failure.  Troponin elevation most consistent with demand ischemia rather than acute coronary syndrome.  The patient has tolerated diuresis early and has been feeling much better.  I have personally reviewed her chest x-ray EKG and  other laboratories with examination as per above.  We have discussed that we will not proceed with any other significant interventional diagnostics at this time.  .bsign

## 2022-07-17 NOTE — Assessment & Plan Note (Signed)
-   The patient was ruled out PE. - We will obtain bilateral lower extremity venous Doppler. - This could be associated with her CLL

## 2022-07-17 NOTE — Assessment & Plan Note (Signed)
We will continue Norvasc. 

## 2022-07-17 NOTE — Progress Notes (Signed)
PHARMACIST - PHYSICIAN COMMUNICATION  CONCERNING:  Enoxaparin (Lovenox) for DVT Prophylaxis    RECOMMENDATION: Patient was prescribed enoxaprin 30 mg q24 hours for VTE prophylaxis.   Filed Weights   07/16/22 2011  Weight: 47.2 kg (104 lb)    Body mass index is 19.02 kg/m.  Estimated Creatinine Clearance: 32.7 mL/min (by C-G formula based on SCr of 0.92 mg/dL).   Based on [place] patient is candidate for enoxaparin 40 mg SQ every 24 hours based on CrCl > 30 and WT > 40 kg   DESCRIPTION: Pharmacy has adjusted enoxaparin dose per [facility] policy.  Patient is now receiving enoxaparin 40 mg every 24 hours    Nesbit Michon Rodriguez-Guzman PharmD, BCPS 07/17/2022 8:43 AM

## 2022-07-17 NOTE — Progress Notes (Signed)
PROGRESS NOTE    Lindsay [name]  PXT:[id number] DOB: Feb 12, 1934 DOA: 07/16/2022 PCP: [name], MD    Assessment & Plan:   Principal Problem:   Acute on chronic systolic CHF (congestive heart failure) ([place]) Active Problems:   Acute respiratory failure with hypoxia (HCC)   CLL (chronic lymphocytic leukemia) (HCC)   Elevated d-dimer   Essential hypertension   Vitamin B12 deficiency  Assessment and Plan:  Acute on chronic combined CHF exacerbation: continue on IV lasix. Monitor I/Os. Cardio consulted. Echo on 05/30/2022 revealing an EF of 45 to 50% with grade 1 diastolic dysfunction, mild to moderate mitral regurgitation, aortic valve moderate regurgitation and mild to moderate aortic valve stenosis.   Acute hypoxic respiratory failure: likely secondary to CHF exacerbation. Continue on supplemental oxygen and wean as tolerated  Macrocytic anemia: likely secondary to B12 deficiency. W/ possible component of acute blood loss anemia. Will give 1 unit of pRBCs today for Hb 6.7. Repeat H&H ordered   CLL: pt does not want treatment for CLL   Elevated d-dimer: CTA chest was neg for PE. B/l LE [place] was neg for DVT   Vitamin B12 deficiency: was on vitamin B12 injections q30days   HTN: hold home dose of amlodipine as per cardio      DVT prophylaxis: SCDs Code Status: DNR Family Communication: discussed pt's care w/ pt's daughter, Leana Roe, at bedside and answered her questions Disposition Plan: likely d/c back home   Level of care: Progressive  Status is: Inpatient Remains inpatient appropriate because: severity of illness   Consultants:    Procedures:   Antimicrobials:    Subjective: Pt c/o malaise   Objective: Vitals:   07/17/22 0315 07/17/22 0330 07/17/22 0530 07/17/22 0700  BP: (!) 124/43 (!) 127/43 (!) 134/41 (!) 137/47  Pulse: 67 66 65 69  Resp: '15 14 15   '$ Temp:      TempSrc:      SpO2: 98% 99% 95% 98%  Weight:      Height:        Intake/Output  Summary (Last 24 hours) at 07/17/2022 0828 Last data filed at 07/17/2022 0540 Gross per 24 hour  Intake 600 ml  Output 1350 ml  Net -750 ml   Filed Weights   07/16/22 2011  Weight: 47.2 kg    Examination:  General exam: Appears calm and comfortable. Frail appearing  Respiratory system: decreased breath sounds b/l  Cardiovascular system: S1 & S2 +. No rubs, gallops or clicks.  Gastrointestinal system: Abdomen is nondistended, soft and nontender.  Normal bowel sounds heard. Central nervous system: Alert and oriented. Moves all extremities  Psychiatry: Judgement and insight appear normal. Mood & affect appropriate.     Data Reviewed: I have personally reviewed following labs and imaging studies  CBC: Recent Labs  Lab 07/16/22 2014 07/17/22 0523  WBC 75.1* 37.7*  NEUTROABS 3.9  --   HGB 7.9* 6.7*  HCT 26.6* 21.7*  MCV 112.7* 108.0*  PLT 234 627   Basic Metabolic Panel: Recent Labs  Lab 07/16/22 2014 07/17/22 0523  NA 140 142  K 4.3 4.0  CL 109 110  CO2 22 24  GLUCOSE 173* 98  BUN 29* 29*  CREATININE 1.24* 0.92  CALCIUM 9.1 8.9  MG 2.3  --    GFR: Estimated Creatinine Clearance: 32.7 mL/min (by C-G formula based on SCr of 0.92 mg/dL). Liver Function Tests: Recent Labs  Lab 07/16/22 2014  AST 28  ALT 13  ALKPHOS 102  BILITOT 1.0  PROT 6.6  ALBUMIN 4.3   No results for input(s): "LIPASE", "AMYLASE" in the last 168 hours. No results for input(s): "AMMONIA" in the last 168 hours. Coagulation Profile: Recent Labs  Lab 07/16/22 2014  INR 1.1   Cardiac Enzymes: No results for input(s): "CKTOTAL", "CKMB", "CKMBINDEX", "TROPONINI" in the last 168 hours. BNP (last 3 results) No results for input(s): "PROBNP" in the last 8760 hours. HbA1C: No results for input(s): "HGBA1C" in the last 72 hours. CBG: No results for input(s): "GLUCAP" in the last 168 hours. Lipid Profile: No results for input(s): "CHOL", "HDL", "LDLCALC", "TRIG", "CHOLHDL", "LDLDIRECT" in  the last 72 hours. Thyroid Function Tests: No results for input(s): "TSH", "T4TOTAL", "FREET4", "T3FREE", "THYROIDAB" in the last 72 hours. Anemia Panel: No results for input(s): "VITAMINB12", "FOLATE", "FERRITIN", "TIBC", "IRON", "RETICCTPCT" in the last 72 hours. Sepsis Labs: Recent Labs  Lab 07/16/22 2013 07/17/22 0248  LATICACIDVEN 2.7* 0.6    Recent Results (from the past 240 hour(s))  SARS Coronavirus 2 by RT PCR (hospital order, performed in [facility] hospital lab) *cepheid single result test* Anterior Nasal Swab     Status: None   Collection Time: 07/16/22  8:13 PM   Specimen: Anterior Nasal Swab  Result Value Ref Range Status   SARS Coronavirus 2 by RT PCR NEGATIVE NEGATIVE Final    Comment: (NOTE) SARS-CoV-2 target nucleic acids are NOT DETECTED.  The SARS-CoV-2 RNA is generally detectable in upper and lower respiratory specimens during the acute phase of infection. The lowest concentration of SARS-CoV-2 viral copies this assay can detect is 250 copies / mL. A negative result does not preclude SARS-CoV-2 infection and should not be used as the sole basis for treatment or other patient management decisions.  A negative result may occur with improper specimen collection / handling, submission of specimen other than nasopharyngeal swab, presence of viral mutation(s) within the areas targeted by this assay, and inadequate number of viral copies (<250 copies / mL). A negative result must be combined with clinical observations, patient history, and epidemiological information.  Fact Sheet for Patients:   https://www.patel.info/  Fact Sheet for Healthcare Providers: https://hall.com/  This test is not yet approved or  cleared by the [place] FDA and has been authorized for detection and/or diagnosis of SARS-CoV-2 by FDA under an Emergency Use Authorization (EUA).  This EUA will remain in effect (meaning this test can be  used) for the duration of the COVID-19 declaration under Section 564(b)(1) of the Act, 21 U.S.C. section 360bbb-3(b)(1), unless the authorization is terminated or revoked sooner.  Performed at [facility], [address] [place], [place] [address]   Blood culture (routine x 2)     Status: None (Preliminary result)   Collection Time: 07/16/22  8:13 PM   Specimen: BLOOD  Result Value Ref Range Status   Specimen Description BLOOD LEFT FA  Final   Special Requests   Final    BOTTLES DRAWN AEROBIC AND ANAEROBIC Blood Culture adequate volume   Culture   Final    NO GROWTH < 12 HOURS Performed at [facility], [address] [place], [place] [address]    Report Status PENDING  Incomplete  Blood culture (routine x 2)     Status: None (Preliminary result)   Collection Time: 07/16/22  8:13 PM   Specimen: BLOOD  Result Value Ref Range Status   Specimen Description BLOOD LEFT FA  Final   Special Requests   Final    BOTTLES DRAWN  AEROBIC AND ANAEROBIC Blood Culture adequate volume   Culture   Final    NO GROWTH < 12 HOURS Performed at [facility], [address] [place], [place] [address]    Report Status PENDING  Incomplete         Radiology Studies: US Venous Img Lower Bilateral (DVT)  Result Date: 07/17/2022 CLINICAL DATA:  Positive D-dimer EXAM: BILATERAL LOWER EXTREMITY VENOUS DOPPLER ULTRASOUND TECHNIQUE: Gray-scale sonography with compression, as well as color and duplex ultrasound, were performed to evaluate the deep venous system(s) from the level of the common femoral vein through the popliteal and proximal calf veins. COMPARISON:  None Available. FINDINGS: VENOUS Normal compressibility of the common femoral, superficial femoral, and popliteal veins, as well as the visualized calf veins. Visualized portions of profunda femoral vein and great saphenous vein unremarkable. No filling defects to suggest DVT on grayscale or color Doppler imaging.  Doppler waveforms show normal direction of venous flow, normal respiratory plasticity and response to augmentation. IMPRESSION: Negative for DVT in the lower extremities. Electronically Signed   By: Jorje Guild M.D.   On: 07/17/2022 06:44   CT Angio Chest PE W and/or Wo Contrast  Result Date: 07/17/2022 CLINICAL DATA:  History of CLL with shortness of breath EXAM: CT ANGIOGRAPHY CHEST WITH CONTRAST TECHNIQUE: Multidetector CT imaging of the chest was performed using the standard protocol during bolus administration of intravenous contrast. Multiplanar CT image reconstructions and MIPs were obtained to evaluate the vascular anatomy. RADIATION DOSE REDUCTION: This exam was performed according to the departmental dose-optimization program which includes automated exposure control, adjustment of the mA and/or kV according to patient size and/or use of iterative reconstruction technique. CONTRAST:  31m OMNIPAQUE IOHEXOL 350 MG/ML SOLN COMPARISON:  Chest x-ray from earlier in the same day, CT from 05/15/2022 FINDINGS: Cardiovascular: Atherosclerotic calcifications of the thoracic aorta are noted. No aneurysmal dilatation is seen. No dissection is noted. Heart is at the upper limits of normal in size. The pulmonary artery shows a normal branching pattern bilaterally. No intraluminal filling defect to suggest pulmonary embolism is noted. Mild coronary calcifications are noted. Mediastinum/Nodes: Thoracic inlet demonstrates extensive lymphadenopathy similar to that seen on prior exam. Mediastinal and hilar adenopathy is noted also stable from the recent exam. Extensive axillary adenopathy is noted similar to that noted on the prior exam. These findings are consistent with the given clinical history of CLL. The esophagus is within normal limits. Lungs/Pleura: Small bilateral pleural effusions are again identified. Increasing basilar atelectasis is noted bilaterally. No sizable parenchymal nodules are noted. Upper  Abdomen: Spleen is prominent consistent with the known history. Musculoskeletal: No chest wall abnormality. No acute or significant osseous findings. Review of the MIP images confirms the above findings. IMPRESSION: No evidence of pulmonary embolism. Small pleural effusions with bibasilar atelectasis. Extensive lymphadenopathy in the thorax similar to that seen on the prior exam consistent with the known history of CLL. Aortic Atherosclerosis (ICD10-I70.0). Electronically Signed   By: MInez CatalinaM.D.   On: 07/17/2022 00:11   DG Chest Port 1 View  Result Date: 07/16/2022 CLINICAL DATA:  Shortness of breath. EXAM: PORTABLE CHEST 1 VIEW COMPARISON:  Chest radiograph 06/30/2022; CT angio chest 05/15/2022. FINDINGS: Of note, the patient is moderately rotated on this portable examination. Borderline enlarged cardiac silhouette. Aortic calcifications. Diffuse hazy interstitial thickening, most notable in the mid and lower lungs. New trace left pleural effusion. No pneumothorax. No acute osseous abnormality. IMPRESSION: Mild pulmonary edema with new trace left pleural effusion.  Aortic Atherosclerosis (ICD10-I70.0). Electronically Signed   By: Ileana Roup M.D.   On: 07/16/2022 20:42        Scheduled Meds:  amLODipine  5 mg Oral Daily   enoxaparin (LOVENOX) injection  30 mg Subcutaneous Q24H   [START ON 07/18/2022] furosemide  40 mg Intravenous Q12H   Continuous Infusions:   LOS: 0 days    Time spent: 35 mins     Wyvonnia Dusky, MD [facility] Pager [phone]  If 7PM-7AM, please contact night-coverage [email or web address] 07/17/2022, 8:28 AM

## 2022-07-17 NOTE — Progress Notes (Signed)
Admission profile updated. ?

## 2022-07-17 NOTE — Assessment & Plan Note (Signed)
-   She may be having an active flare given significant worsening of her leukocytosis.  She could be having stress demargination from her acute CHF and respiratory failure. - The patient has elected not to have chemotherapy. - If need be oncology consult can be obtained.  The morning hospitalist discretion while following her.

## 2022-07-17 NOTE — ED Notes (Signed)
Blood consent signed and placed in chart.

## 2022-07-17 NOTE — ED Notes (Signed)
Rn aware bed assigned 

## 2022-07-17 NOTE — Assessment & Plan Note (Addendum)
-   The patient will be admitted to a PCU bed. - We will continue diuresis with IV Lasix. - We will follow serial troponins specially given slightly elevated troponin.  She has no chest pain. - Cardiology consult will be obtained. - I notified Dr. Saralyn Pilar about the patient. - She had a 2D echo on 05/30/2022 revealing an EF of 45 to 50% with grade 1 diastolic dysfunction, mild to moderate mitral regurgitation, aortic valve moderate regurgitation and mild to moderate aortic valve stenosis.

## 2022-07-17 NOTE — ED Notes (Signed)
NP at bedside.

## 2022-07-17 NOTE — Assessment & Plan Note (Addendum)
-   This is clearly secondary to #1. - O2 protocol will be followed. - Management otherwise as above. - She initially required CPAP then BiPAP and is currently on nasal cannula.

## 2022-07-18 ENCOUNTER — Other Ambulatory Visit: Payer: Self-pay

## 2022-07-18 DIAGNOSIS — I5023 Acute on chronic systolic (congestive) heart failure: Secondary | ICD-10-CM | POA: Diagnosis not present

## 2022-07-18 DIAGNOSIS — D539 Nutritional anemia, unspecified: Secondary | ICD-10-CM

## 2022-07-18 DIAGNOSIS — C9110 Chronic lymphocytic leukemia of B-cell type not having achieved remission: Secondary | ICD-10-CM | POA: Diagnosis not present

## 2022-07-18 LAB — BASIC METABOLIC PANEL
Anion gap: 10 (ref 5–15)
BUN: 29 mg/dL — ABNORMAL HIGH (ref 8–23)
CO2: 27 mmol/L (ref 22–32)
Calcium: 8.6 mg/dL — ABNORMAL LOW (ref 8.9–10.3)
Chloride: 105 mmol/L (ref 98–111)
Creatinine, Ser: 1.2 mg/dL — ABNORMAL HIGH (ref 0.44–1.00)
GFR, Estimated: 44 mL/min — ABNORMAL LOW (ref >60)
Glucose, Bld: 100 mg/dL — ABNORMAL HIGH (ref 70–99)
Potassium: 3.5 mmol/L (ref 3.5–5.1)
Sodium: 142 mmol/L (ref 135–145)

## 2022-07-18 LAB — CBC
HCT: 22.3 % — ABNORMAL LOW (ref 36.0–46.0)
Hemoglobin: 7.1 g/dL — ABNORMAL LOW (ref 12.0–15.0)
MCH: 34.1 pg — ABNORMAL HIGH (ref 26.0–34.0)
MCHC: 31.8 g/dL (ref 30.0–36.0)
MCV: 107.2 fL — ABNORMAL HIGH (ref 80.0–100.0)
Platelets: 175 10*3/uL (ref 150–400)
RBC: 2.08 MIL/uL — ABNORMAL LOW (ref 3.87–5.11)
RDW: 16 % — ABNORMAL HIGH (ref 11.5–15.5)
WBC: 38.6 10*3/uL — ABNORMAL HIGH (ref 4.0–10.5)
nRBC: 0.1 % (ref 0.0–0.2)

## 2022-07-18 LAB — PREPARE RBC (CROSSMATCH)

## 2022-07-18 LAB — HEMOGLOBIN AND HEMATOCRIT, BLOOD
HCT: 32.5 % — ABNORMAL LOW (ref 36.0–46.0)
Hemoglobin: 10.3 g/dL — ABNORMAL LOW (ref 12.0–15.0)

## 2022-07-18 MED ORDER — POTASSIUM CHLORIDE 20 MEQ PO PACK
40.0000 meq | PACK | Freq: Once | ORAL | Status: AC
Start: 2022-07-18 — End: 2022-07-18
  Administered 2022-07-18: 40 meq via ORAL
  Filled 2022-07-18: qty 2

## 2022-07-18 MED ORDER — AMLODIPINE BESYLATE 5 MG PO TABS
5.0000 mg | ORAL_TABLET | Freq: Every day | ORAL | Status: DC
  Administered 2022-07-18 – 2022-07-19 (×2): 5 mg via ORAL
  Filled 2022-07-18 (×2): qty 1

## 2022-07-18 MED ORDER — FUROSEMIDE 20 MG PO TABS
20.0000 mg | ORAL_TABLET | Freq: Every day | ORAL | Status: DC
Start: 2022-07-19 — End: 2022-07-19
  Filled 2022-07-18: qty 1

## 2022-07-18 NOTE — Progress Notes (Signed)
OT Cancellation Note  Patient Details Name: Lindsay [name] MRN: [id number] DOB: Nov 19, 1934   Cancelled Treatment:    Reason Eval/Treat Not Completed: OT screened, no needs identified, will sign off. Order received and chart reviewed. Per conversation with PT, pt back at baseline functional independence. Will sign off, please re-consult if new needs arise.   Dessie Coma, M.S. OTR/L  07/18/22, 10:08 AM  ascom [phone]

## 2022-07-18 NOTE — Progress Notes (Addendum)
[place] NOTE       Patient ID: Lindsay [name] MRN: [id number] DOB/AGE: May 06, 1934 88 y.o.  Admit date: 07/16/2022 Referring Physician Dr. Eugenie Norrie Primary Physician Dr. 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  She was in respiratory distress and hypoxic to the 80s with EMS and was placed on CPAP, eventually BiPAP in the ED, but now weaned to nasal cannula.  Cardiology is consulted for assistance with her heart failure.  Interval History:  - diuresed 1.7L since admission, now weaned off 2L to room air - feels much better today, no shortness of breath or chest pain. Ambulated with PT without difficulty or much assistance - getting a unit of blood during interview.   Review of systems complete and found to be negative unless listed above     Past Medical History:  Diagnosis Date   Arthritis    RHEUMATOID   CHF (congestive heart failure) (HCC)    CLL (chronic lymphocytic leukemia) (HCC)    Edema    MILD ANKLE OCCAS   Fibromyalgia    GERD (gastroesophageal reflux disease)    Hypertension    Kidney stones    RLS (restless legs syndrome)     Past Surgical History:  Procedure Laterality Date   ABDOMINAL HYSTERECTOMY     CATARACT EXTRACTION W/PHACO Left 02/07/2016   Procedure: CATARACT EXTRACTION PHACO AND INTRAOCULAR LENS PLACEMENT ([place]);  Surgeon: Estill Cotta, MD;  Location: [facility] ORS;  Service: Ophthalmology;  Laterality: Left;  [place] 01:33 AP% 24.7 CDE 39.57 fluid pack lot # [id number]   EXTRACORPOREAL SHOCK WAVE  LITHOTRIPSY Right 07/03/2019   Procedure: EXTRACORPOREAL SHOCK WAVE LITHOTRIPSY (ESWL);  Surgeon: Royston Cowper, MD;  Location: [facility] ORS;  Service: Urology;  Laterality: Right;   EYE SURGERY     HIP ARTHROPLASTY Left 04/20/2020   Procedure: ARTHROPLASTY BIPOLAR HIP (HEMIARTHROPLASTY);  Surgeon: Hessie Knows, MD;  Location: [facility] ORS;  Service: Orthopedics;  Laterality: Left;   LITHOTRIPSY     TONSILLECTOMY      Medications Prior to Admission  Medication Sig Dispense Refill Last Dose   amitriptyline (ELAVIL) 10 MG tablet Take 20 mg by mouth at bedtime.   07/15/2022   amLODipine (NORVASC) 5 MG tablet Take 5 mg by mouth daily.   07/16/2022   cyanocobalamin (,VITAMIN B-12,) 1000 MCG/ML injection Inject into the muscle every 30 (thirty) days.   Past Month   feeding supplement, ENSURE ENLIVE, (ENSURE ENLIVE) LIQD Take 237 mLs by mouth 2 (two) times daily between meals. 237 mL 12 Past Week   HYDROcodone-acetaminophen (NORCO/VICODIN) 5-325 MG tablet Take 1-2 tablets by mouth every 4 (four) hours as needed for moderate pain (pain score 4-6). (Patient taking differently: Take 1-2 tablets by mouth every 4 (four) hours as needed for moderate pain (pain score 4-6). Takes ~twice daily for arthritis) 30 tablet 0 07/16/2022   Multiple Vitamin (MULTIVITAMIN WITH MINERALS) TABS tablet Take 1 tablet by mouth daily. 30 tablet 0 07/16/2022   furosemide (LASIX) 20 MG tablet Take 1 tablet (20 mg total) by mouth daily. (Patient not taking: Reported on 06/30/2022) 30 tablet 11 Not Taking  Social History   Socioeconomic History   Marital status: Widowed    Spouse name: Not on file   Number of children: Not on file   Years of education: Not on file   Highest education level: Not on file  Occupational History   Not on file  Tobacco Use   Smoking status: Never   Smokeless tobacco: Never  Substance and Sexual Activity   Alcohol use: No   Drug use: Never   Sexual activity: Not on file  Other Topics Concern   Not  on file  Social History Narrative   Lives with daughter, in [place]. Remote smoking; no alcohol. 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    Social Determinants of Health   Financial Resource Strain: Not on file  Food Insecurity: Not on file  Transportation Needs: Not on file  Physical Activity: Not on file  Stress: Not on file  Social Connections: Not on file  Intimate Partner Violence: Not on file    Family History  Problem Relation Age of Onset   Cancer Mother        uterine cancer      PHYSICAL EXAM General: Very pleasant elderly Caucasian female, well nourished, in no acute distress.  Sitting upright in recliner getting a unit of blood HEENT:  Normocephalic and atraumatic. Neck:  No JVD.  Lungs: Normal respiratory effort on room air. Clear to ascultation bilaterally  heart: HRRR . Normal S1 and S2 without 2/6 systolic murmur murmurs. Radial & DP pulses 2+ bilaterally. Abdomen: Non-distended appearing.  Msk: Normal strength and tone for age. Extremities: Warm and well perfused. No clubbing, cyanosis.  No peripheral edema Neuro: Alert and oriented X 3. Psych:  Answers questions appropriately.   Labs:   Lab Results  Component Value Date   WBC 38.6 (H) 07/18/2022   HGB 7.1 (L) 07/18/2022   HCT 22.3 (L) 07/18/2022   MCV 107.2 (H) 07/18/2022   PLT 175 07/18/2022    Recent Labs  Lab 07/16/22 2014 07/17/22 0523 07/18/22 0327  NA 140   < > 142  K 4.3   < > 3.5  CL 109   < > 105  CO2 22   < > 27  BUN 29*   < > 29*  CREATININE 1.24*   < > 1.20*  CALCIUM 9.1   < > 8.6*  PROT 6.6  --   --   BILITOT 1.0  --   --   ALKPHOS 102  --   --   ALT 13  --   --   AST 28  --   --   GLUCOSE 173*   < > 100*   < > = values in this interval not displayed.    Lab Results  Component Value Date   TROPONINI < 0.02 03/06/2014   No results found for: "CHOL" No results found for: "HDL" No results found for: "LDLCALC" No results found for: "TRIG" No results found for: "CHOLHDL" No results  found for: "LDLDIRECT"    Radiology: US Venous Img Lower Bilateral (DVT)  Result Date: 07/17/2022 CLINICAL DATA:  Positive D-dimer EXAM: BILATERAL LOWER EXTREMITY VENOUS DOPPLER ULTRASOUND TECHNIQUE: Gray-scale sonography with compression, as well as color and duplex ultrasound, were performed to evaluate the deep venous system(s) from the level of the common femoral vein through the popliteal and proximal calf veins. COMPARISON:  None Available. FINDINGS: VENOUS Normal compressibility of the common femoral, superficial femoral, and popliteal veins, as well as the visualized calf veins. Visualized portions  of profunda femoral vein and great saphenous vein unremarkable. No filling defects to suggest DVT on grayscale or color Doppler imaging. Doppler waveforms show normal direction of venous flow, normal respiratory plasticity and response to augmentation. IMPRESSION: Negative for DVT in the lower extremities. Electronically Signed   By: Jorje Guild M.D.   On: 07/17/2022 06:44   CT Angio Chest PE W and/or Wo Contrast  Result Date: 07/17/2022 CLINICAL DATA:  History of CLL with shortness of breath EXAM: CT ANGIOGRAPHY CHEST WITH CONTRAST TECHNIQUE: Multidetector CT imaging of the chest was performed using the standard protocol during bolus administration of intravenous contrast. Multiplanar CT image reconstructions and MIPs were obtained to evaluate the vascular anatomy. RADIATION DOSE REDUCTION: This exam was performed according to the departmental dose-optimization program which includes automated exposure control, adjustment of the mA and/or kV according to patient size and/or use of iterative reconstruction technique. CONTRAST:  21m OMNIPAQUE IOHEXOL 350 MG/ML SOLN COMPARISON:  Chest x-ray from earlier in the same day, CT from 05/15/2022 FINDINGS: Cardiovascular: Atherosclerotic calcifications of the thoracic aorta are noted. No aneurysmal dilatation is seen. No dissection is noted. Heart is at the  upper limits of normal in size. The pulmonary artery shows a normal branching pattern bilaterally. No intraluminal filling defect to suggest pulmonary embolism is noted. Mild coronary calcifications are noted. Mediastinum/Nodes: Thoracic inlet demonstrates extensive lymphadenopathy similar to that seen on prior exam. Mediastinal and hilar adenopathy is noted also stable from the recent exam. Extensive axillary adenopathy is noted similar to that noted on the prior exam. These findings are consistent with the given clinical history of CLL. The esophagus is within normal limits. Lungs/Pleura: Small bilateral pleural effusions are again identified. Increasing basilar atelectasis is noted bilaterally. No sizable parenchymal nodules are noted. Upper Abdomen: Spleen is prominent consistent with the known history. Musculoskeletal: No chest wall abnormality. No acute or significant osseous findings. Review of the MIP images confirms the above findings. IMPRESSION: No evidence of pulmonary embolism. Small pleural effusions with bibasilar atelectasis. Extensive lymphadenopathy in the thorax similar to that seen on the prior exam consistent with the known history of CLL. Aortic Atherosclerosis (ICD10-I70.0). Electronically Signed   By: MInez CatalinaM.D.   On: 07/17/2022 00:11   DG Chest Port 1 View  Result Date: 07/16/2022 CLINICAL DATA:  Shortness of breath. EXAM: PORTABLE CHEST 1 VIEW COMPARISON:  Chest radiograph 06/30/2022; CT angio chest 05/15/2022. FINDINGS: Of note, the patient is moderately rotated on this portable examination. Borderline enlarged cardiac silhouette. Aortic calcifications. Diffuse hazy interstitial thickening, most notable in the mid and lower lungs. New trace left pleural effusion. No pneumothorax. No acute osseous abnormality. IMPRESSION: Mild pulmonary edema with new trace left pleural effusion. Aortic Atherosclerosis (ICD10-I70.0). Electronically Signed   By: LIleana RoupM.D.   On: 07/16/2022  20:42   DG Chest 2 View  Result Date: 06/30/2022 CLINICAL DATA:  Provided history: Cough, congestion, shortness of breath, history of CHF. EXAM: CHEST - 2 VIEW COMPARISON:  CT angiogram chest 05/15/2022. Chest radiograph 05/15/2022. FINDINGS: Size at the upper limits of normal. Aortic atherosclerosis. Interstitial predominant opacities within the bilateral lung bases. No evidence of pleural effusion or pneumothorax. No acute bony abnormality identified. Dextrocurvature of the thoracic spine. IMPRESSION: Interstitial predominant opacities within the bilateral lung bases. Findings may reflect interstitial edema. However, correlate clinically to exclude signs/symptoms of an atypical/viral infection (which can have a similar imaging appearance). Aortic Atherosclerosis (ICD10-I70.0). Electronically Signed   By: KKellie SimmeringD.O.  On: 06/30/2022 10:44    ECHO 05/2022  1. Left ventricular ejection fraction, by estimation, is 45 to 50%. The  left ventricle has mildly decreased function. The left ventricle  demonstrates global hypokinesis. Left ventricular diastolic parameters are  consistent with Grade I diastolic  dysfunction (impaired relaxation). Elevated left atrial pressure. The  average left ventricular global longitudinal strain is -9.7 %. The global  longitudinal strain is abnormal.   2. Right ventricular systolic function is normal. The right ventricular  size is normal. There is normal pulmonary artery systolic pressure.   3. The mitral valve is abnormal. Mild to moderate mitral valve  regurgitation. No evidence of mitral stenosis.   4. The aortic valve has an indeterminant number of cusps. There is  moderate thickening of the aortic valve. Aortic valve regurgitation is  moderate. Mild to moderate aortic valve stenosis.   5. The inferior vena cava is normal in size with <50% respiratory  variability, suggesting right atrial pressure of 8 mmHg.   FINDINGS   Left Ventricle: Left ventricular  ejection fraction, by estimation, is 45  to 50%. The left ventricle has mildly decreased function. The left  ventricle demonstrates global hypokinesis. The average left ventricular  global longitudinal strain is -9.7 %. The   global longitudinal strain is abnormal. The left ventricular internal  cavity size was normal in size. There is no left ventricular hypertrophy.  Abnormal (paradoxical) septal motion, consistent with left bundle branch  block. Left ventricular diastolic  parameters are consistent with Grade I diastolic dysfunction (impaired  relaxation). Elevated left atrial pressure.   Right Ventricle: The right ventricular size is normal. No increase in  right ventricular wall thickness. Right ventricular systolic function is  normal. There is normal pulmonary artery systolic pressure. The tricuspid  regurgitant velocity is 2.00 m/s, and   with an assumed right atrial pressure of 8 mmHg, the estimated right  ventricular systolic pressure is 36.1 mmHg.   Left Atrium: Left atrial size was normal in size.   Right Atrium: Right atrial size was normal in size.   Pericardium: There is no evidence of pericardial effusion.   Mitral Valve: The mitral valve is abnormal. There is mild thickening of  the mitral valve leaflet(s). Mild to moderate mitral valve regurgitation.  No evidence of mitral valve stenosis. MV peak gradient, 8.1 mmHg. The mean  mitral valve gradient is 4.0  mmHg.   Tricuspid Valve: The tricuspid valve is normal in structure. Tricuspid  valve regurgitation is mild.   Aortic Valve: The aortic valve has an indeterminant number of cusps. There  is moderate thickening of the aortic valve. Aortic valve regurgitation is  moderate. Aortic regurgitation PHT measures 472 msec. Mild to moderate  aortic stenosis is present. Aortic   valve mean gradient measures 13.7 mmHg. Aortic valve peak gradient  measures 22.7 mmHg. Aortic valve area, by VTI measures 1.17 cm.    Pulmonic Valve: The pulmonic valve was not well visualized. Pulmonic valve  regurgitation is not visualized. No evidence of pulmonic stenosis.   Aorta: The aortic root is normal in size and structure.   Pulmonary Artery: The pulmonary artery is not well seen.   Venous: The inferior vena cava is normal in size with less than 50%  respiratory variability, suggesting right atrial pressure of 8 mmHg.   IAS/Shunts: The interatrial septum was not well visualized.      LEFT VENTRICLE  PLAX 2D  LVIDd:         4.80 cm  Diastology  LVIDs:         3.80 cm      LV e' medial:    4.35 cm/s  LV PW:         0.90 cm      LV E/e' medial:  25.1  LV IVS:        0.70 cm      LV e' lateral:   5.11 cm/s  LVOT diam:     2.00 cm      LV E/e' lateral: 21.3  LV SV:         64  LV SV Index:   43           2D Longitudinal Strain  LVOT Area:     3.14 cm     2D Strain GLS Avg:     -9.7 %     LV Volumes (MOD)  LV vol d, MOD A2C: 79.1 ml  3D Volume EF:  LV vol d, MOD A4C: 107.0 ml 3D EF:        54 %  LV vol s, MOD A2C: 71.5 ml  LV EDV:       158 ml  LV vol s, MOD A4C: 47.8 ml  LV ESV:       73 ml  LV SV MOD A2C:     7.6 ml   LV SV:        84 ml  LV SV MOD A4C:     107.0 ml  LV SV MOD BP:      31.2 ml   RIGHT VENTRICLE  RV Basal diam:  2.60 cm  RV S prime:     14.80 cm/s  TAPSE (M-mode): 2.5 cm   LEFT ATRIUM             Index        RIGHT ATRIUM           Index  LA diam:        4.00 cm 2.70 cm/m   RA Area:     11.50 cm  LA Vol (A2C):   52.1 ml 35.14 ml/m  RA Volume:   24.00 ml  16.19 ml/m  LA Vol (A4C):   36.0 ml 24.28 ml/m  LA Biplane Vol: 43.5 ml 29.34 ml/m   AORTIC VALVE                     PULMONIC VALVE  AV Area (Vmax):    1.06 cm      PV Vmax:        1.08 m/s  AV Area (Vmean):   0.98 cm      PV Vmean:       72.800 cm/s  AV Area (VTI):     1.17 cm      PV VTI:         0.240 m  AV Vmax:           238.00 cm/s   PV Peak grad:   4.7 mmHg  AV Vmean:          173.333 cm/s  PV Mean  grad:   2.0 mmHg  AV VTI:            0.546 m       RVOT Peak grad: 3 mmHg  AV Peak Grad:      22.7 mmHg  AV Mean Grad:      13.7 mmHg  LVOT Vmax:  80.30 cm/s  LVOT Vmean:        54.000 cm/s  LVOT VTI:          0.204 m  LVOT/AV VTI ratio: 0.37  AI PHT:            472 msec     AORTA  Ao Root diam: 2.50 cm   MITRAL VALVE                TRICUSPID VALVE  MV Area (PHT): 2.32 cm     TR Peak grad:   16.0 mmHg  MV Area VTI:   1.36 cm     TR Vmax:        200.00 cm/s  MV Peak grad:  8.1 mmHg  MV Mean grad:  4.0 mmHg     SHUNTS  MV Vmax:       1.42 m/s     Systemic VTI:  0.20 m  MV Vmean:      100.0 cm/s   Systemic Diam: 2.00 cm  MV Decel Time: 327 msec     Pulmonic VTI:  0.146 m  MV E velocity: 109.00 cm/s  MV A velocity: 130.00 cm/s  MV E/A ratio:  0.84   Christopher End MD  Electronically signed by Nelva Bush MD  Signature Date/Time: 05/30/2022/6:09:41 PM   TELEMETRY reviewed by me: Sinus rhythm rate 60s-70s  EKG reviewed by me: Sinus rhythm LBBB rate 88, overall similar lower to priors dating back to 2015.  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  She was in respiratory distress and hypoxic to the 80s with EMS and was placed on CPAP, eventually BiPAP in the ED, but now weaned to nasal cannula.  Cardiology is consulted for assistance with her heart failure.  #Acute on chronicHFmrEF (LVEF 45-50%, global hypo, g1dd 05/2022) #Acute hypoxic respiratory failure #Elevated troponin The patient presents with worsening leg swelling and occasional shortness of breath since her Lasix was changed to as needed dosing 2 weeks ago.   She was in respiratory distress and hypoxic to the 80s, requiring BiPAP in the ED, now weaned to 2 L by nasal cannula and feeling much better.  Her BNP is elevated to the 500s and she has vascular congestion and a trace left pleural effusion on chest x-ray, somewhat clinically volume overloaded appearing.  Her troponin is minimally elevated at 27-45, likely representing demand ischemia and not ACS.  -Agree with current therapy per primary team -S/p 80 mg IV lasix x1 and '40mg'$  IV x 2 with excellent diuresis  -hold further diuresis today with bump in Cr. Discharge on '20mg'$  of lasix PO daily -Defer repeat echo as patient had a recent study 1 month ago -restart amlodipine 5 mg, defer metoprolol XL with baseline relative bradycardia and addition of ARB/Acei with bump in Cr -Strict I's/O, daily weights, salt and fluid restriction -likely ok for discharge either later this afternoon or tomorrow morning from a cardiac standpoint.   This patient's plan of care was discussed and created with Dr. Nehemiah Massed and he is in agreement.  Signed: Tristan Schroeder , PA-C 07/18/2022, 12:33 PM [facility] Card[facility]  The patient has had quite a bit of  urine output with significant improvements in her shortness of breath but does need further intervention and treatment with her significant anemia.  She is being transfused at this time and will need intravenous Lasix after her transfusion.  Otherwise there is been no evidence of anginal symptoms or need for further cardiac diagnostics or intervention.  We will plan for her to receive her treatment as per above with possible discharge to home tomorrow on guideline medical therapy as tolerated  The patient has been interviewed and examined. I agree with assessment and plan above. Serafina Royals MD [facility]

## 2022-07-18 NOTE — Plan of Care (Signed)

## 2022-07-18 NOTE — Evaluation (Signed)
Physical Therapy Evaluation Patient Details Name: Lindsay [name] MRN: [id number] DOB: June 06, 1934 Today's Date: 07/18/2022  History of Present Illness  Pt is an 88 yo female admitted for CHF exacerbation. PMH of HTN, L hemiarthroplasty, OA, leukemia since May 2019, RA, fibromyalgia.   Clinical Impression  Patient A&Ox4, denied pain. Reported at baseline she is independent, and that her daughter lives with her, the pt does not drive at baseline and denies any falls in the last 6 months.   Pt at 100% on 2L, trialed on room air, and spO2 >95% throughout session, RN and MD notified. The patient was able to perform bed mobility independently, ambulate without AD with supervision. No LOB, but did occasionally reach for external support. Endorsed more fatigue than baseline but attributed it to laying down since Sunday. She was able to ambulate ~158f. Returned to room, all needs in reach and all questions answered. The patient demonstrated and reported return to baseline level of functioning, no further acute PT needs indicated. PT to sign off. Please reconsult PT if pt status changes or acute needs are identified.         Recommendations for follow up therapy are one component of a multi-disciplinary discharge planning process, led by the attending physician.  Recommendations may be updated based on patient status, additional functional criteria and insurance authorization.  Follow Up Recommendations No PT follow up      Assistance Recommended at Discharge PRN  Patient can return home with the following  Assist for transportation;Help with stairs or ramp for entrance    Equipment Recommendations None recommended by PT  Recommendations for Other Services       Functional Status Assessment Patient has not had a recent decline in their functional status     Precautions / Restrictions Precautions Precautions: Fall Restrictions Weight Bearing Restrictions: No      Mobility  Bed  Mobility Overal bed mobility: Independent                  Transfers Overall transfer level: Independent                      Ambulation/Gait Ambulation/Gait assistance: Supervision Gait Distance (Feet): 120 Feet Assistive device: None         General Gait Details: did reach for external support intermittently, though not required. pt reported more fatigue than baseline but endorses it is due to laying down since Sunday  Stairs            Wheelchair Mobility    Modified Rankin (Stroke Patients Only)       Balance Overall balance assessment: Needs assistance Sitting-balance support: Feet supported Sitting balance-Leahy Scale: Normal       Standing balance-Leahy Scale: Good                               Pertinent Vitals/Pain Pain Assessment Pain Assessment: No/denies pain    Home Living Family/patient expects to be discharged to:: Private residence Living Arrangements: Children Available Help at Discharge: Family Type of Home: House Home Access: Stairs to enter Entrance Stairs-Rails: None Entrance Stairs-Number of Steps: 2-3   Home Layout: One level Home Equipment: None      Prior Function Prior Level of Function : Independent/Modified Independent             Mobility Comments: pt does not drive       Hand Dominance  Extremity/Trunk Assessment   Upper Extremity Assessment Upper Extremity Assessment: Overall WFL for tasks assessed    Lower Extremity Assessment Lower Extremity Assessment: Overall WFL for tasks assessed    Cervical / Trunk Assessment Cervical / Trunk Assessment: Normal  Communication   Communication: HOH  Cognition Arousal/Alertness: Awake/alert Behavior During Therapy: WFL for tasks assessed/performed Overall Cognitive Status: Within Functional Limits for tasks assessed                                          General Comments      Exercises      Assessment/Plan    PT Assessment Patient does not need any further PT services  PT Problem List         PT Treatment Interventions      PT Goals (Current goals can be found in the Care Plan section)       Frequency       Co-evaluation               AM-PAC PT "6 Clicks" Mobility  Outcome Measure Help needed turning from your back to your side while in a flat bed without using bedrails?: None Help needed moving from lying on your back to sitting on the side of a flat bed without using bedrails?: None Help needed moving to and from a bed to a chair (including a wheelchair)?: None Help needed standing up from a chair using your arms (e.g., wheelchair or bedside chair)?: None Help needed to walk in hospital room?: None Help needed climbing 3-5 steps with a railing? : None 6 Click Score: 24    End of Session Equipment Utilized During Treatment: Gait belt Activity Tolerance: Patient tolerated treatment well Patient left: in chair;with call bell/phone within reach Nurse Communication: Mobility status PT Visit Diagnosis: Other abnormalities of gait and mobility (R26.89)    Time: 2542-7062 PT Time Calculation (min) (ACUTE ONLY): 14 min   Charges:   PT Evaluation $PT Eval Low Complexity: 1 Low PT Treatments $Therapeutic Activity: 8-22 mins        Lieutenant Diego PT, DPT 10:32 AM,07/18/22

## 2022-07-18 NOTE — Progress Notes (Signed)
Mobility Specialist - Progress Note    07/18/22 1529  Mobility  Activity Ambulated independently in room  Level of Assistance Independent  Assistive Device None  Distance Ambulated (ft) 10 ft  Activity Response Tolerated well  $Mobility charge 1 Mobility    Author walked by and pt walking around in room on RA. Pt preparing bag for upcoming discharge. Pt returns to bed with needs in reach.  Gretchen Short  Mobility Specialist  07/18/22 3:31 PM

## 2022-07-18 NOTE — Progress Notes (Signed)
PROGRESS NOTE   HPI was taken from Dr. Sidney Ace: Lindsay [name] is a 88 y.o. Caucasian female with medical history significant for combined systolic and diastolic CHF, CLL, electing not to be treated due to previous side effects with chemotherapy, fibromyalgia, GERD, nephrolithiasis s/p lithotripsy in 2020, recurrent UTI, hypertension, RLS and rheumatoid arthritis, who presented to the ER with acute onset of worsening dyspnea with associated cough that is mainly dry and occasionally productive of yellowish-white sputum as well as wheezing that got significantly worse since yesterday.  She denies any chest pain or palpitations.  No fever or chills.  No nausea or vomiting or abdominal pain.  No dysuria, oliguria or hematuria or flank pain.  No bleeding diathesis.  She was in significant distress with EMS she was placed on CPAP on route to the hospital as her pulse currently was 80% on room air and when she came to the ER that she was placed on BiPAP but was later tapered to nasal cannula and she was feeling more comfortable.  She stated that she has not taken her Lasix for the last couple of days.  ED Course: Upon arrival to the emergency room BP was 138/47 with otherwise normal vital signs.  Labs revealed a blood glucose of 173 with a BUN of 29 creatinine 1.24 above previous levels 2 weeks ago.  BNP was 551 and high-sensitivity troponin I was 27 and later 45.  Lactic acid was 2.7 and CBC showed leukocytosis 75.1 compared to 34.2 on 06/30/2022, with left cytosis of 67.3.  D-dimer was 1.31 and otherwise coagulation profile was within normal. EKG as reviewed by me : EKG showed sinus rhythm with a rate of 88 with left bundle branch block that is old. Imaging: Portable chest x-ray showed mild pulmonary edema with new trace left pleural effusion and aortic atherosclerosis.   Chest CTA revealed the following: No evidence of pulmonary embolism.   Small pleural effusions with bibasilar atelectasis.   Extensive  lymphadenopathy in the thorax similar to that seen on the prior exam consistent with the known history of CLL.   Aortic Atherosclerosis.  The patient was given IV cefepime and Zithromax as well as 80 mg of IV Lasix.  She will be admitted to a progressive unit bed for further evaluation and management.   NYARA CAPELL  WCB:[id number] DOB: 05-21-1934 DOA: 07/16/2022 PCP: [name], MD    Assessment & Plan:   Principal Problem:   Acute on chronic systolic CHF (congestive heart failure) ([facility] [facility]) Active Problems:   Acute respiratory failure with hypoxia ([facility])   CLL (chronic lymphocytic leukemia) (HCC)   Elevated d-dimer   Essential hypertension   Vitamin B12 deficiency  Assessment and Plan:  Acute on chronic combined CHF exacerbation: holding lasix today as Cr increased as per cardio. Will d/c home lasix '20mg'$  daily as per cardio. Echo on 05/30/2022 revealing an EF of 45 to 50% with grade 1 diastolic dysfunction, mild to moderate mitral regurgitation, aortic valve moderate regurgitation and mild to moderate aortic valve stenosis. Cardio recs apprec    Acute hypoxic respiratory failure: likely secondary to CHF exacerbation. Weaned off of supplemental oxygen today   Macrocytic anemia: likely secondary to B12 deficiency. W/ possible component of acute blood loss anemia. 1 unit of pRBCs ordered on 7/31 to give but pt had antibodies as per blood bank so the 1 unit of pRBCs was not given on 07/17/22. The 1 unit of pRBCs will be transfused 07/18/22 as blood back cleared for  the pt to get blood. Repeat H&H 4 hours post transfusion     CLL: pt does not want treatment for CLL. Etiology of the leukocytosis    Elevated d-dimer: CTA chest was neg for PE. B/l LE [place] was neg for DVT   Vitamin B12 deficiency: was on vitamin B12 injections q30days    HTN: restart home dose of amlodipine      DVT prophylaxis: SCDs Code Status: DNR Family Communication: discussed pt's care w/ pt's daughter, Leana Roe,  at bedside and answered her questions Disposition Plan: likely d/c back home tomorrow if H&H are stable/trending up   Level of care: Progressive  Status is: Inpatient Remains inpatient appropriate because: getting 1 unit of prbcs today. Can likely d/c tomorrow if H&H are stable/trending up    Consultants:    Procedures:   Antimicrobials:    Subjective: Pt c/o fatigue   Objective: Vitals:   07/18/22 0016 07/18/22 0403 07/18/22 0500 07/18/22 0735  BP: (!) 146/36 (!) 135/38  139/70  Pulse: (!) 59 60  66  Resp: '19 16  15  '$ Temp: 98.1 F (36.7 C) 97.9 F (36.6 C)  98.1 F (36.7 C)  TempSrc: Oral Oral    SpO2: 99% 98%  98%  Weight:   48.4 kg   Height:        Intake/Output Summary (Last 24 hours) at 07/18/2022 0739 Last data filed at 07/18/2022 0100 Gross per 24 hour  Intake 120 ml  Output 1150 ml  Net -1030 ml   Filed Weights   07/16/22 2011 07/18/22 0500  Weight: 47.2 kg 48.4 kg    Examination:  General exam: Appears comfortable. Frail appearing  Respiratory system: diminished breath sounds b/l otherwise clear  Cardiovascular system: S1/S2+. No rubs or clicks  Gastrointestinal system: Abd is soft, NT, ND & normal bowel sounds  Central nervous system: alert and oriented. Moves all extremities  Psychiatry: judgement and insight appears normal. Appropriate mood and affect     Data Reviewed: I have personally reviewed following labs and imaging studies  CBC: Recent Labs  Lab 07/16/22 2014 07/17/22 0523 07/18/22 0327  WBC 75.1* 37.7* 38.6*  NEUTROABS 3.9  --   --   HGB 7.9* 6.7* 7.1*  HCT 26.6* 21.7* 22.3*  MCV 112.7* 108.0* 107.2*  PLT 234 170 496   Basic Metabolic Panel: Recent Labs  Lab 07/16/22 2014 07/17/22 0523 07/18/22 0327  NA 140 142 142  K 4.3 4.0 3.5  CL 109 110 105  CO2 '22 24 27  '$ GLUCOSE 173* 98 100*  BUN 29* 29* 29*  CREATININE 1.24* 0.92 1.20*  CALCIUM 9.1 8.9 8.6*  MG 2.3  --   --    GFR: Estimated Creatinine Clearance: 25.7  mL/min (A) (by C-G formula based on SCr of 1.2 mg/dL (H)). Liver Function Tests: Recent Labs  Lab 07/16/22 2014  AST 28  ALT 13  ALKPHOS 102  BILITOT 1.0  PROT 6.6  ALBUMIN 4.3   No results for input(s): "LIPASE", "AMYLASE" in the last 168 hours. No results for input(s): "AMMONIA" in the last 168 hours. Coagulation Profile: Recent Labs  Lab 07/16/22 2014  INR 1.1   Cardiac Enzymes: No results for input(s): "CKTOTAL", "CKMB", "CKMBINDEX", "TROPONINI" in the last 168 hours. BNP (last 3 results) No results for input(s): "PROBNP" in the last 8760 hours. HbA1C: No results for input(s): "HGBA1C" in the last 72 hours. CBG: No results for input(s): "GLUCAP" in the last 168 hours. Lipid Profile: No results for  input(s): "CHOL", "HDL", "LDLCALC", "TRIG", "CHOLHDL", "LDLDIRECT" in the last 72 hours. Thyroid Function Tests: No results for input(s): "TSH", "T4TOTAL", "FREET4", "T3FREE", "THYROIDAB" in the last 72 hours. Anemia Panel: No results for input(s): "VITAMINB12", "FOLATE", "FERRITIN", "TIBC", "IRON", "RETICCTPCT" in the last 72 hours. Sepsis Labs: Recent Labs  Lab 07/16/22 2013 07/17/22 0248  LATICACIDVEN 2.7* 0.6    Recent Results (from the past 240 hour(s))  SARS Coronavirus 2 by RT PCR (hospital order, performed in [facility] hospital lab) *cepheid single result test* Anterior Nasal Swab     Status: None   Collection Time: 07/16/22  8:13 PM   Specimen: Anterior Nasal Swab  Result Value Ref Range Status   SARS Coronavirus 2 by RT PCR NEGATIVE NEGATIVE Final    Comment: (NOTE) SARS-CoV-2 target nucleic acids are NOT DETECTED.  The SARS-CoV-2 RNA is generally detectable in upper and lower respiratory specimens during the acute phase of infection. The lowest concentration of SARS-CoV-2 viral copies this assay can detect is 250 copies / mL. A negative result does not preclude SARS-CoV-2 infection and should not be used as the sole basis for treatment or  other patient management decisions.  A negative result may occur with improper specimen collection / handling, submission of specimen other than nasopharyngeal swab, presence of viral mutation(s) within the areas targeted by this assay, and inadequate number of viral copies (<250 copies / mL). A negative result must be combined with clinical observations, patient history, and epidemiological information.  Fact Sheet for Patients:   https://www.patel.info/  Fact Sheet for Healthcare Providers: https://hall.com/  This test is not yet approved or  cleared by the [place] FDA and has been authorized for detection and/or diagnosis of SARS-CoV-2 by FDA under an Emergency Use Authorization (EUA).  This EUA will remain in effect (meaning this test can be used) for the duration of the COVID-19 declaration under Section 564(b)(1) of the Act, 21 U.S.C. section 360bbb-3(b)(1), unless the authorization is terminated or revoked sooner.  Performed at [facility], [address] [place], [place] [address]   Blood culture (routine x 2)     Status: None (Preliminary result)   Collection Time: 07/16/22  8:13 PM   Specimen: BLOOD  Result Value Ref Range Status   Specimen Description BLOOD LEFT FA  Final   Special Requests   Final    BOTTLES DRAWN AEROBIC AND ANAEROBIC Blood Culture adequate volume   Culture   Final    NO GROWTH < 24 HOURS Performed at [facility], [address] [place], [place] [address]    Report Status PENDING  Incomplete  Blood culture (routine x 2)     Status: None (Preliminary result)   Collection Time: 07/16/22  8:13 PM   Specimen: BLOOD  Result Value Ref Range Status   Specimen Description BLOOD LEFT FA  Final   Special Requests   Final    BOTTLES DRAWN AEROBIC AND ANAEROBIC Blood Culture adequate volume   Culture   Final    NO GROWTH < 24 HOURS Performed at [facility], [address], [place],  [address]    Report Status PENDING  Incomplete         Radiology Studies: US Venous Img Lower Bilateral (DVT)  Result Date: 07/17/2022 CLINICAL DATA:  Positive D-dimer EXAM: BILATERAL LOWER EXTREMITY VENOUS DOPPLER ULTRASOUND TECHNIQUE: Gray-scale sonography with compression, as well as color and duplex ultrasound, were performed to evaluate the deep venous system(s) from the level of  the common femoral vein through the popliteal and proximal calf veins. COMPARISON:  None Available. FINDINGS: VENOUS Normal compressibility of the common femoral, superficial femoral, and popliteal veins, as well as the visualized calf veins. Visualized portions of profunda femoral vein and great saphenous vein unremarkable. No filling defects to suggest DVT on grayscale or color Doppler imaging. Doppler waveforms show normal direction of venous flow, normal respiratory plasticity and response to augmentation. IMPRESSION: Negative for DVT in the lower extremities. Electronically Signed   By: Jorje Guild M.D.   On: 07/17/2022 06:44   CT Angio Chest PE W and/or Wo Contrast  Result Date: 07/17/2022 CLINICAL DATA:  History of CLL with shortness of breath EXAM: CT ANGIOGRAPHY CHEST WITH CONTRAST TECHNIQUE: Multidetector CT imaging of the chest was performed using the standard protocol during bolus administration of intravenous contrast. Multiplanar CT image reconstructions and MIPs were obtained to evaluate the vascular anatomy. RADIATION DOSE REDUCTION: This exam was performed according to the departmental dose-optimization program which includes automated exposure control, adjustment of the mA and/or kV according to patient size and/or use of iterative reconstruction technique. CONTRAST:  45m OMNIPAQUE IOHEXOL 350 MG/ML SOLN COMPARISON:  Chest x-ray from earlier in the same day, CT from 05/15/2022 FINDINGS: Cardiovascular: Atherosclerotic calcifications of the thoracic aorta are noted. No  aneurysmal dilatation is seen. No dissection is noted. Heart is at the upper limits of normal in size. The pulmonary artery shows a normal branching pattern bilaterally. No intraluminal filling defect to suggest pulmonary embolism is noted. Mild coronary calcifications are noted. Mediastinum/Nodes: Thoracic inlet demonstrates extensive lymphadenopathy similar to that seen on prior exam. Mediastinal and hilar adenopathy is noted also stable from the recent exam. Extensive axillary adenopathy is noted similar to that noted on the prior exam. These findings are consistent with the given clinical history of CLL. The esophagus is within normal limits. Lungs/Pleura: Small bilateral pleural effusions are again identified. Increasing basilar atelectasis is noted bilaterally. No sizable parenchymal nodules are noted. Upper Abdomen: Spleen is prominent consistent with the known history. Musculoskeletal: No chest wall abnormality. No acute or significant osseous findings. Review of the MIP images confirms the above findings. IMPRESSION: No evidence of pulmonary embolism. Small pleural effusions with bibasilar atelectasis. Extensive lymphadenopathy in the thorax similar to that seen on the prior exam consistent with the known history of CLL. Aortic Atherosclerosis (ICD10-I70.0). Electronically Signed   By: MInez CatalinaM.D.   On: 07/17/2022 00:11   DG Chest Port 1 View  Result Date: 07/16/2022 CLINICAL DATA:  Shortness of breath. EXAM: PORTABLE CHEST 1 VIEW COMPARISON:  Chest radiograph 06/30/2022; CT angio chest 05/15/2022. FINDINGS: Of note, the patient is moderately rotated on this portable examination. Borderline enlarged cardiac silhouette. Aortic calcifications. Diffuse hazy interstitial thickening, most notable in the mid and lower lungs. New trace left pleural effusion. No pneumothorax. No acute osseous abnormality. IMPRESSION: Mild pulmonary edema with new trace left pleural effusion. Aortic Atherosclerosis  (ICD10-I70.0). Electronically Signed   By: LIleana RoupM.D.   On: 07/16/2022 20:42        Scheduled Meds:  amitriptyline  20 mg Oral QHS   Continuous Infusions:  sodium chloride       LOS: 1 day    Time spent: 36 mins     JWyvonnia Dusky MD [facility] Pager [phone]  If 7PM-7AM, please contact night-coverage [email or web address] 07/18/2022, 7:39 AM

## 2022-07-19 LAB — BASIC METABOLIC PANEL
Anion gap: 7 (ref 5–15)
BUN: 31 mg/dL — ABNORMAL HIGH (ref 8–23)
CO2: 25 mmol/L (ref 22–32)
Calcium: 8.5 mg/dL — ABNORMAL LOW (ref 8.9–10.3)
Chloride: 107 mmol/L (ref 98–111)
Creatinine, Ser: 1.26 mg/dL — ABNORMAL HIGH (ref 0.44–1.00)
GFR, Estimated: 42 mL/min — ABNORMAL LOW (ref >60)
Glucose, Bld: 134 mg/dL — ABNORMAL HIGH (ref 70–99)
Potassium: 3.5 mmol/L (ref 3.5–5.1)
Sodium: 139 mmol/L (ref 135–145)

## 2022-07-19 LAB — CBC
HCT: 27.2 % — ABNORMAL LOW (ref 36.0–46.0)
Hemoglobin: 8.7 g/dL — ABNORMAL LOW (ref 12.0–15.0)
MCH: 33 pg (ref 26.0–34.0)
MCHC: 32 g/dL (ref 30.0–36.0)
MCV: 103 fL — ABNORMAL HIGH (ref 80.0–100.0)
Platelets: 179 10*3/uL (ref 150–400)
RBC: 2.64 MIL/uL — ABNORMAL LOW (ref 3.87–5.11)
RDW: 17.5 % — ABNORMAL HIGH (ref 11.5–15.5)
WBC: 42.7 10*3/uL — ABNORMAL HIGH (ref 4.0–10.5)
nRBC: 0 % (ref 0.0–0.2)

## 2022-07-19 LAB — IRON AND TIBC
Iron: 105 ug/dL (ref 28–170)
Saturation Ratios: 32 % — ABNORMAL HIGH (ref 10.4–31.8)
TIBC: 325 ug/dL (ref 250–450)
UIBC: 220 ug/dL

## 2022-07-19 LAB — FOLATE: Folate: 12.3 ng/mL (ref >5.9)

## 2022-07-19 LAB — VITAMIN B12: Vitamin B-12: 242 pg/mL (ref 180–914)

## 2022-07-19 MED ORDER — FUROSEMIDE 20 MG PO TABS: 20.0000 mg | ORAL_TABLET | Freq: Every day | ORAL | Status: DC

## 2022-07-19 NOTE — Discharge Summary (Signed)
Physician Discharge Summary   Lindsay [name]  female DOB: 17-Jan-1934  OXB:[id number]  PCP: [name], MD  Admit date: 07/16/2022 Discharge date: 07/19/2022  Admitted From: home Disposition:  home CODE STATUS: DNR  Discharge Instructions     Diet - low sodium heart healthy   Complete by: As directed    Discharge instructions   Complete by: As directed    Please take your home Lasix 20 mg daily as prescribed.  Please follow up with your PCP in about a week to check your kidney function and anemia workup.   Dr. Enzo Bi [facility] Course:  For full details, please see H&P, progress notes, consult notes and ancillary notes.  Briefly,  Lindsay [name] is a 88 y.o. Caucasian female with medical history significant for combined systolic and diastolic CHF, CLL, electing not to be treated due to previous side effects with chemotherapy, fibromyalgia, nephrolithiasis s/p lithotripsy in 2020, recurrent UTI, hypertension, and rheumatoid arthritis, who presented to the ER with acute onset of worsening dyspnea with associated cough.   She was in significant distress with EMS she was placed on CPAP on route to the hospital as her pulse currently was 80% on room air and when she came to the ER that she was placed on BiPAP but was later tapered to nasal cannula.  She stated that she has not taken her Lasix for the last couple of days.  Acute on chronic combined CHF exacerbation:  --CXR showed pulm edema with trace left pleural effusion.  BNP 551.  Hypoxia.   --Echo in Jun 2023 showed LVEF 45-50% with grade I DD. --cardiology consulted.  Pt received 80 mg IV lasix x1 and '40mg'$  IV x 2 with excellent diuresis.  IV diuresis then held due to Cr bump and pt was discharged on '20mg'$  of lasix PO daily. --defer metoprolol XL with baseline relative bradycardia and defer addition of ARB/Acei with bump in Cr  Acute hypoxic respiratory failure: secondary to CHF exacerbation.  Weaned off  of supplemental oxygen prior to discharge.   Macrocytic anemia:  likely secondary to B12 deficiency.  Anemia workup added on the day of discharge. --1 unit of pRBCs given for Hgb 6.7.   --PCP to follow result vit B12, folate and iron profile.   CLL:  pt does not want treatment for CLL. Etiology of the leukocytosis   Vitamin B12 deficiency:  was on vitamin B12 injections q30 days    HTN:  Cont home dose of amlodipine    Unless noted above, medications under "STOP" list are ones pt was not taking PTA.  Discharge Diagnoses:  Principal Problem:   Acute on chronic systolic CHF (congestive heart failure) (HCC) Active Problems:   Acute respiratory failure with hypoxia (HCC)   CLL (chronic lymphocytic leukemia) (HCC)   Elevated d-dimer   Essential hypertension   Vitamin B12 deficiency   30 Day Unplanned Readmission Risk Score    Flowsheet Row ED to Hosp-Admission (Current) from 07/16/2022 in [facility]  30 Day Unplanned Readmission Risk Score (%) 18.06 Filed at 07/19/2022 0801       This score is the patient's risk of an unplanned readmission within 30 days of being discharged (0 -100%). The score is based on dignosis, age, lab data, medications, orders, and past utilization.   Low:  0-14.9   Medium: 15-21.9   High: 22-29.9   Extreme: 30 and above  Discharge Instructions:  Allergies as of 07/19/2022       Reactions   Demerol [meperidine]    Septra [sulfamethoxazole-trimethoprim]         Medication List     TAKE these medications    amitriptyline 10 MG tablet Commonly known as: ELAVIL Take 20 mg by mouth at bedtime.   amLODipine 5 MG tablet Commonly known as: NORVASC Take 5 mg by mouth daily.   cyanocobalamin 1000 MCG/ML injection Commonly known as: VITAMIN B12 Inject into the muscle every 30 (thirty) days.   feeding supplement Liqd Take 237 mLs by mouth 2 (two) times daily between meals.   furosemide 20 MG tablet Commonly  known as: Lasix Take 1 tablet (20 mg total) by mouth daily.   HYDROcodone-acetaminophen 5-325 MG tablet Commonly known as: NORCO/VICODIN Take 1-2 tablets by mouth every 4 (four) hours as needed for moderate pain (pain score 4-6). What changed: additional instructions   multivitamin with minerals Tabs tablet Take 1 tablet by mouth daily.         Follow-up Information     Corey Skains, MD. Go in 1 week(s).   Specialty: Cardiology Contact information: [facility] [place] [address] [phone]         [name], MD Follow up in 1 week(s).   Specialty: Family Medicine Contact information: [address] [place] [address] [phone]                 Allergies  Allergen Reactions   Demerol [Meperidine]    Septra [Sulfamethoxazole-Trimethoprim]      The results of significant diagnostics from this hospitalization (including imaging, microbiology, ancillary and laboratory) are listed below for reference.   Consultations:   Procedures/Studies: US Venous Img Lower Bilateral (DVT)  Result Date: 07/17/2022 CLINICAL DATA:  Positive D-dimer EXAM: BILATERAL LOWER EXTREMITY VENOUS DOPPLER ULTRASOUND TECHNIQUE: Gray-scale sonography with compression, as well as color and duplex ultrasound, were performed to evaluate the deep venous system(s) from the level of the common femoral vein through the popliteal and proximal calf veins. COMPARISON:  None Available. FINDINGS: VENOUS Normal compressibility of the common femoral, superficial femoral, and popliteal veins, as well as the visualized calf veins. Visualized portions of profunda femoral vein and great saphenous vein unremarkable. No filling defects to suggest DVT on grayscale or color Doppler imaging. Doppler waveforms show normal direction of venous flow, normal respiratory plasticity and response to augmentation. IMPRESSION: Negative for DVT  in the lower extremities. Electronically Signed   By: Jorje Guild M.D.   On: 07/17/2022 06:44   CT Angio Chest PE W and/or Wo Contrast  Result Date: 07/17/2022 CLINICAL DATA:  History of CLL with shortness of breath EXAM: CT ANGIOGRAPHY CHEST WITH CONTRAST TECHNIQUE: Multidetector CT imaging of the chest was performed using the standard protocol during bolus administration of intravenous contrast. Multiplanar CT image reconstructions and MIPs were obtained to evaluate the vascular anatomy. RADIATION DOSE REDUCTION: This exam was performed according to the departmental dose-optimization program which includes automated exposure control, adjustment of the mA and/or kV according to patient size and/or use of iterative reconstruction technique. CONTRAST:  32m OMNIPAQUE IOHEXOL 350 MG/ML SOLN COMPARISON:  Chest x-ray from earlier in the same day, CT from 05/15/2022 FINDINGS: Cardiovascular: Atherosclerotic calcifications of the thoracic aorta are noted. No aneurysmal dilatation is seen. No dissection is noted. Heart is at the upper limits of normal in size. The pulmonary artery shows a normal branching  pattern bilaterally. No intraluminal filling defect to suggest pulmonary embolism is noted. Mild coronary calcifications are noted. Mediastinum/Nodes: Thoracic inlet demonstrates extensive lymphadenopathy similar to that seen on prior exam. Mediastinal and hilar adenopathy is noted also stable from the recent exam. Extensive axillary adenopathy is noted similar to that noted on the prior exam. These findings are consistent with the given clinical history of CLL. The esophagus is within normal limits. Lungs/Pleura: Small bilateral pleural effusions are again identified. Increasing basilar atelectasis is noted bilaterally. No sizable parenchymal nodules are noted. Upper Abdomen: Spleen is prominent consistent with the known history. Musculoskeletal: No chest wall abnormality. No acute or significant osseous  findings. Review of the MIP images confirms the above findings. IMPRESSION: No evidence of pulmonary embolism. Small pleural effusions with bibasilar atelectasis. Extensive lymphadenopathy in the thorax similar to that seen on the prior exam consistent with the known history of CLL. Aortic Atherosclerosis (ICD10-I70.0). Electronically Signed   By: Inez Catalina M.D.   On: 07/17/2022 00:11   DG Chest Port 1 View  Result Date: 07/16/2022 CLINICAL DATA:  Shortness of breath. EXAM: PORTABLE CHEST 1 VIEW COMPARISON:  Chest radiograph 06/30/2022; CT angio chest 05/15/2022. FINDINGS: Of note, the patient is moderately rotated on this portable examination. Borderline enlarged cardiac silhouette. Aortic calcifications. Diffuse hazy interstitial thickening, most notable in the mid and lower lungs. New trace left pleural effusion. No pneumothorax. No acute osseous abnormality. IMPRESSION: Mild pulmonary edema with new trace left pleural effusion. Aortic Atherosclerosis (ICD10-I70.0). Electronically Signed   By: Ileana Roup M.D.   On: 07/16/2022 20:42   DG Chest 2 View  Result Date: 06/30/2022 CLINICAL DATA:  Provided history: Cough, congestion, shortness of breath, history of CHF. EXAM: CHEST - 2 VIEW COMPARISON:  CT angiogram chest 05/15/2022. Chest radiograph 05/15/2022. FINDINGS: Size at the upper limits of normal. Aortic atherosclerosis. Interstitial predominant opacities within the bilateral lung bases. No evidence of pleural effusion or pneumothorax. No acute bony abnormality identified. Dextrocurvature of the thoracic spine. IMPRESSION: Interstitial predominant opacities within the bilateral lung bases. Findings may reflect interstitial edema. However, correlate clinically to exclude signs/symptoms of an atypical/viral infection (which can have a similar imaging appearance). Aortic Atherosclerosis (ICD10-I70.0). Electronically Signed   By: Kellie Simmering D.O.   On: 06/30/2022 10:44      Labs: BNP (last 3  results) Recent Labs    05/15/22 2017 06/30/22 1256 07/16/22 2013  BNP 456.6* 602.8* 465.0*   Basic Metabolic Panel: Recent Labs  Lab 07/16/22 2014 07/17/22 0523 07/18/22 0327 07/19/22 0359  NA 140 142 142 139  K 4.3 4.0 3.5 3.5  CL 109 110 105 107  CO2 '22 24 27 25  '$ GLUCOSE 173* 98 100* 134*  BUN 29* 29* 29* 31*  CREATININE 1.24* 0.92 1.20* 1.26*  CALCIUM 9.1 8.9 8.6* 8.5*  MG 2.3  --   --   --    Liver Function Tests: Recent Labs  Lab 07/16/22 2014  AST 28  ALT 13  ALKPHOS 102  BILITOT 1.0  PROT 6.6  ALBUMIN 4.3   No results for input(s): "LIPASE", "AMYLASE" in the last 168 hours. No results for input(s): "AMMONIA" in the last 168 hours. CBC: Recent Labs  Lab 07/16/22 2014 07/17/22 0523 07/18/22 0327 07/18/22 1836 07/19/22 0359  WBC 75.1* 37.7* 38.6*  --  42.7*  NEUTROABS 3.9  --   --   --   --   HGB 7.9* 6.7* 7.1* 10.3* 8.7*  HCT 26.6* 21.7* 22.3* 32.5* 27.2*  MCV 112.7* 108.0* 107.2*  --  103.0*  PLT 234 170 175  --  179   Cardiac Enzymes: No results for input(s): "CKTOTAL", "CKMB", "CKMBINDEX", "TROPONINI" in the last 168 hours. BNP: Invalid input(s): "POCBNP" CBG: No results for input(s): "GLUCAP" in the last 168 hours. D-Dimer Recent Labs    07/16/22 2014  DDIMER 1.31*   Hgb A1c No results for input(s): "HGBA1C" in the last 72 hours. Lipid Profile No results for input(s): "CHOL", "HDL", "LDLCALC", "TRIG", "CHOLHDL", "LDLDIRECT" in the last 72 hours. Thyroid function studies No results for input(s): "TSH", "T4TOTAL", "T3FREE", "THYROIDAB" in the last 72 hours.  Invalid input(s): "FREET3" Anemia work up No results for input(s): "VITAMINB12", "FOLATE", "FERRITIN", "TIBC", "IRON", "RETICCTPCT" in the last 72 hours. Urinalysis    Component Value Date/Time   COLORURINE STRAW (A) 07/16/2022 2316   APPEARANCEUR CLEAR (A) 07/16/2022 2316   APPEARANCEUR Clear 03/06/2014 2053   LABSPEC 1.008 07/16/2022 2316   LABSPEC 1.008 03/06/2014 2053    PHURINE 5.0 07/16/2022 2316   GLUCOSEU NEGATIVE 07/16/2022 2316   GLUCOSEU Negative 03/06/2014 2053   HGBUR NEGATIVE 07/16/2022 2316   BILIRUBINUR NEGATIVE 07/16/2022 2316   BILIRUBINUR Negative 03/06/2014 2053   KETONESUR NEGATIVE 07/16/2022 2316   PROTEINUR NEGATIVE 07/16/2022 2316   NITRITE NEGATIVE 07/16/2022 2316   LEUKOCYTESUR NEGATIVE 07/16/2022 2316   LEUKOCYTESUR Negative 03/06/2014 2053   Sepsis Labs Recent Labs  Lab 07/16/22 2014 07/17/22 0523 07/18/22 0327 07/19/22 0359  WBC 75.1* 37.7* 38.6* 42.7*   Microbiology Recent Results (from the past 240 hour(s))  SARS Coronavirus 2 by RT PCR (hospital order, performed in [place] hospital lab) *cepheid single result test* Anterior Nasal Swab     Status: None   Collection Time: 07/16/22  8:13 PM   Specimen: Anterior Nasal Swab  Result Value Ref Range Status   SARS Coronavirus 2 by RT PCR NEGATIVE NEGATIVE Final    Comment: (NOTE) SARS-CoV-2 target nucleic acids are NOT DETECTED.  The SARS-CoV-2 RNA is generally detectable in upper and lower respiratory specimens during the acute phase of infection. The lowest concentration of SARS-CoV-2 viral copies this assay can detect is 250 copies / mL. A negative result does not preclude SARS-CoV-2 infection and should not be used as the sole basis for treatment or other patient management decisions.  A negative result may occur with improper specimen collection / handling, submission of specimen other than nasopharyngeal swab, presence of viral mutation(s) within the areas targeted by this assay, and inadequate number of viral copies (<250 copies / mL). A negative result must be combined with clinical observations, patient history, and epidemiological information.  Fact Sheet for Patients:   https://www.patel.info/  Fact Sheet for Healthcare Providers: https://hall.com/  This test is not yet approved or  cleared by the [place] FDA and has been authorized for detection and/or diagnosis of SARS-CoV-2 by FDA under an Emergency Use Authorization (EUA).  This EUA will remain in effect (meaning this test can be used) for the duration of the COVID-19 declaration under Section 564(b)(1) of the Act, 21 U.S.C. section 360bbb-3(b)(1), unless the authorization is terminated or revoked sooner.  Performed at [facility], [address] [place], [place] [address]   Blood culture (routine x 2)     Status: None (Preliminary result)   Collection Time: 07/16/22  8:13 PM   Specimen: BLOOD  Result Value Ref Range Status   Specimen Description BLOOD LEFT FA  Final   Special Requests   Final  BOTTLES DRAWN AEROBIC AND ANAEROBIC Blood Culture adequate volume   Culture   Final    NO GROWTH 3 DAYS Performed at [facility], [address] [place], [place] [address]    Report Status PENDING  Incomplete  Blood culture (routine x 2)     Status: None (Preliminary result)   Collection Time: 07/16/22  8:13 PM   Specimen: BLOOD  Result Value Ref Range Status   Specimen Description BLOOD LEFT FA  Final   Special Requests   Final    BOTTLES DRAWN AEROBIC AND ANAEROBIC Blood Culture adequate volume   Culture   Final    NO GROWTH 3 DAYS Performed at [facility], [address] [place], [place] [address]    Report Status PENDING  Incomplete     Total time spend on discharging this patient, including the last patient exam, discussing the hospital stay, instructions for ongoing care as it relates to all pertinent caregivers, as well as preparing the medical discharge records, prescriptions, and/or referrals as applicable, is 40 minutes.    Enzo Bi, MD  [facility] 07/19/2022, 8:15 AM

## 2022-07-19 NOTE — Plan of Care (Signed)

## 2022-07-19 NOTE — Progress Notes (Signed)
Mobility Specialist - Progress Note     07/19/22 0800  Mobility  Activity Ambulated independently in hallway;Stood at bedside;Dangled on edge of bed  Level of Assistance Standby assist, set-up cues, supervision of patient - no hands on  Assistive Device None  Distance Ambulated (ft) 120 ft  Activity Response Tolerated well  $Mobility charge 1 Mobility    Pt supine in bed on RA upon arrival. PT STS and ambulates SBA. Pt returns to EOB with needs in reach and sister in room.    Gretchen Short  Mobility Specialist  07/19/22 8:56 AM

## 2022-07-21 LAB — CULTURE, BLOOD (ROUTINE X 2)
Culture: NO GROWTH
Culture: NO GROWTH
Special Requests: ADEQUATE
Special Requests: ADEQUATE

## 2022-07-29 LAB — BPAM RBC
Blood Product Expiration Date: 202308312359
Blood Product Expiration Date: 202309012359
Blood Product Expiration Date: 202309082359
Blood Product Expiration Date: 202309102359
ISSUE DATE / TIME: 202308011155
Unit Type and Rh: 5100
Unit Type and Rh: 5100
Unit Type and Rh: 5100
Unit Type and Rh: 9500

## 2022-07-29 LAB — TYPE AND SCREEN
ABO/RH(D): O POS
Antibody Screen: POSITIVE
DAT, IgG: POSITIVE
DAT, complement: NEGATIVE
Donor AG Type: NEGATIVE
Unit division: 0
Unit division: 0
Unit division: 0
Unit division: 0

## 2022-07-31 ENCOUNTER — Telehealth: Payer: Self-pay

## 2022-07-31 ENCOUNTER — Inpatient Hospital Stay: Payer: Medicare Other | Admitting: Internal Medicine

## 2022-07-31 ENCOUNTER — Inpatient Hospital Stay: Payer: Medicare Other | Attending: Hospice and Palliative Medicine

## 2022-07-31 NOTE — Telephone Encounter (Signed)
Pt's daughter called to state her mother is stable but has been experiencing increased shortness of breath the past few days. She denies weight gain or any signs of swelling.  Spoke with Darylene Price, FNP, and she will see the patient tomorrow. Daughter agreed with this plan. and states they can come to appt tomorrow at 12:30.

## 2022-07-31 NOTE — Progress Notes (Unsigned)
Patient ID: Lindsay [name], female    DOB: 1934-12-08, 88 y.o.   MRN: [id number]  HPI  Lindsay [name] is a 88 y/o female with a history of HTN, CKD, CLL, fibromyalgia, GERD, RLS and heart failure.   Echo report from 05/30/22 reviewed and showed an EF of 45-50% along with mild/moderate MR  Admitted 07/16/22 due to acute onset of worsening dyspnea with associated cough after not taking her diuretic for a few days. Placed on bipap and then weaned off. Initially given IV lasix with transition to oral diuretics. Cardiology consult obtained. 1 unit PRBC's given due to anemia. Discharged after 3 days.   She presents today for a follow-up visit with a chief complaint of minimal fatigue with moderate exertion. Describes this as chronic in nature. Has associated shortness of breath and intermittent abdominal distention along with this. She denies any dizziness, difficulty sleeping, palpitations, pedal edema, chest pain, cough or weight gain.   Past Medical History:  Diagnosis Date   Arthritis    RHEUMATOID   CHF (congestive heart failure) (HCC)    CLL (chronic lymphocytic leukemia) (HCC)    Edema    MILD ANKLE OCCAS   Fibromyalgia    GERD (gastroesophageal reflux disease)    Hypertension    Kidney stones    RLS (restless legs syndrome)    Past Surgical History:  Procedure Laterality Date   ABDOMINAL HYSTERECTOMY     CATARACT EXTRACTION W/PHACO Left 02/07/2016   Procedure: CATARACT EXTRACTION PHACO AND INTRAOCULAR LENS PLACEMENT ([place]);  Surgeon: Estill Cotta, MD;  Location: [facility] ORS;  Service: Ophthalmology;  Laterality: Left;  [place] 01:33 AP% 24.7 CDE 39.57 fluid pack lot # [id number]   EXTRACORPOREAL SHOCK WAVE LITHOTRIPSY Right 07/03/2019   Procedure: EXTRACORPOREAL SHOCK WAVE LITHOTRIPSY (ESWL);  Surgeon: Royston Cowper, MD;  Location: [facility] ORS;  Service: Urology;  Laterality: Right;   EYE SURGERY     HIP ARTHROPLASTY Left 04/20/2020   Procedure: ARTHROPLASTY BIPOLAR HIP (HEMIARTHROPLASTY);   Surgeon: Hessie Knows, MD;  Location: [facility] ORS;  Service: Orthopedics;  Laterality: Left;   LITHOTRIPSY     TONSILLECTOMY     Family History  Problem Relation Age of Onset   Cancer Mother        uterine cancer   Social History   Tobacco Use   Smoking status: Never   Smokeless tobacco: Never  Substance Use Topics   Alcohol use: No   Allergies  Allergen Reactions   Demerol [Meperidine]    Septra [Sulfamethoxazole-Trimethoprim]    Prior to Admission medications   Medication Sig Start Date End Date Taking? Authorizing Provider  amitriptyline (ELAVIL) 10 MG tablet Take 20 mg by mouth at bedtime.   Yes [provider]  amLODipine (NORVASC) 5 MG tablet Take 5 mg by mouth daily. 02/20/22  Yes [provider]  cyanocobalamin (,VITAMIN B-12,) 1000 MCG/ML injection Inject into the muscle every 30 (thirty) days. 07/04/21  Yes [provider]  feeding supplement, ENSURE ENLIVE, (ENSURE ENLIVE) LIQD Take 237 mLs by mouth 2 (two) times daily between meals. 04/22/20  Yes Swayze, Ava, DO  furosemide (LASIX) 20 MG tablet Take 1 tablet (20 mg total) by mouth daily. 05/16/22 05/16/23 Yes Wouk, Ailene Rud, MD  HYDROcodone-acetaminophen (NORCO/VICODIN) 5-325 MG tablet Take 1-2 tablets by mouth every 4 (four) hours as needed for moderate pain (pain score 4-6). Patient taking differently: Take 1-2 tablets by mouth every 4 (four) hours as needed for moderate pain (pain score 4-6). Takes ~twice daily  for arthritis 04/22/20  Yes Duanne Guess, PA-C  Multiple Vitamin (MULTIVITAMIN WITH MINERALS) TABS tablet Take 1 tablet by mouth daily. 04/23/20  Yes Swayze, Ava, DO    Review of Systems  Constitutional:  Positive for fatigue. Negative for appetite change.  HENT:  Negative for congestion, postnasal drip and sore throat.   Eyes: Negative.   Respiratory:  Positive for shortness of breath (at times). Negative for cough and chest tightness.   Cardiovascular:  Negative for chest pain,  palpitations and leg swelling.  Gastrointestinal:  Positive for abdominal distention (at times). Negative for abdominal pain.  Endocrine: Negative.   Genitourinary: Negative.   Musculoskeletal:  Negative for back pain and neck pain.  Skin: Negative.   Allergic/Immunologic: Negative.   Neurological:  Negative for dizziness and light-headedness.  Hematological:  Negative for adenopathy. Does not bruise/bleed easily.  Psychiatric/Behavioral:  Negative for dysphoric mood and sleep disturbance (sleeping on towels). The patient is not nervous/anxious.    Vitals:   08/01/22 1308  BP: (!) 152/33  Pulse: 68  Resp: 16  SpO2: 100%  Weight: 111 lb 2 oz (50.4 kg)  Height: '5\' 2"'$  (1.575 m)   Wt Readings from Last 3 Encounters:  08/01/22 111 lb 2 oz (50.4 kg)  07/19/22 111 lb 1.8 oz (50.4 kg)  06/30/22 112 lb 9.6 oz (51.1 kg)   Lab Results  Component Value Date   CREATININE 1.26 (H) 07/19/2022   CREATININE 1.20 (H) 07/18/2022   CREATININE 0.92 07/17/2022   Physical Exam Vitals and nursing note reviewed. Exam conducted with a chaperone present (daughter).  Constitutional:      Appearance: Normal appearance.  HENT:     Head: Normocephalic and atraumatic.  Cardiovascular:     Rate and Rhythm: Normal rate and regular rhythm.  Pulmonary:     Effort: Pulmonary effort is normal. No respiratory distress.     Breath sounds: No wheezing or rales.  Abdominal:     General: There is no distension.     Palpations: Abdomen is soft.  Musculoskeletal:        General: No tenderness.     Cervical back: Normal range of motion and neck supple.     Right lower leg: No edema.     Left lower leg: No edema.  Skin:    General: Skin is warm and dry.  Neurological:     General: No focal deficit present.     Mental Status: She is alert and oriented to person, place, and time.  Psychiatric:        Mood and Affect: Mood normal.        Behavior: Behavior normal.        Thought Content: Thought content  normal.    Assessment & Plan:  1: Chronic heart failure with mildly reduced ejection fraction- - NYHA class II - euvolemic today - weighing daily; reminded to call for an overnight weight gain of > 2 pounds or a weekly weight gain of > 5 pounds - weight unchanged from last visit here 2 months ago - watching her sodium intake - history of recurrent UTI so most likely will not be a candidate for SGLT2 - consider adding entresto at next visit - reviewed watching for sensation of abdominal distention or early satiety since she says her weight typically doesn't change nor does she get pedal edema - if she notices abdominal sensation/ early satiety, she can take an extra furosemide tablet; if she does this, she needs to write  down when she takes it as well as let us know when this happens - BNP 07/16/22 was 551.3  2: HTN- - BP mildly elevated (152/33) - saw PCP Kary Kos) 05/24/22; returns in 2 days - BMP 07/19/22 reviewed and showed sodium 139, potassium 3.5, creatinine 1.26 and GFR 42 - recheck BMP today  3: CLL- - intolerant to several chemotherapeutic agents and currently off treatment since October 2022 - saw oncology palliative care ([facility]) 06/30/22   Medication bottles reviewed.   Return in 1 month, sooner if needed.

## 2022-08-01 ENCOUNTER — Ambulatory Visit (HOSPITAL_BASED_OUTPATIENT_CLINIC_OR_DEPARTMENT_OTHER): Payer: Medicare Other | Admitting: Family

## 2022-08-01 ENCOUNTER — Encounter: Payer: Self-pay | Admitting: Family

## 2022-08-01 ENCOUNTER — Other Ambulatory Visit
Admission: RE | Admit: 2022-08-01 | Discharge: 2022-08-01 | Disposition: A | Discharge status: Home / Self Care | Payer: Medicare Other | Source: Ambulatory Visit | Attending: Family | Admitting: Family

## 2022-08-01 VITALS — BP 152/33 | HR 68 | Resp 16 | Ht 62.0 in | Wt 111.1 lb

## 2022-08-01 DIAGNOSIS — I10 Essential (primary) hypertension: Secondary | ICD-10-CM

## 2022-08-01 DIAGNOSIS — I509 Heart failure, unspecified: Secondary | ICD-10-CM | POA: Insufficient documentation

## 2022-08-01 DIAGNOSIS — C9110 Chronic lymphocytic leukemia of B-cell type not having achieved remission: Secondary | ICD-10-CM | POA: Insufficient documentation

## 2022-08-01 DIAGNOSIS — D649 Anemia, unspecified: Secondary | ICD-10-CM | POA: Insufficient documentation

## 2022-08-01 DIAGNOSIS — K219 Gastro-esophageal reflux disease without esophagitis: Secondary | ICD-10-CM | POA: Insufficient documentation

## 2022-08-01 DIAGNOSIS — N189 Chronic kidney disease, unspecified: Secondary | ICD-10-CM | POA: Insufficient documentation

## 2022-08-01 DIAGNOSIS — I5032 Chronic diastolic (congestive) heart failure: Secondary | ICD-10-CM

## 2022-08-01 DIAGNOSIS — Z87440 Personal history of urinary (tract) infections: Secondary | ICD-10-CM | POA: Insufficient documentation

## 2022-08-01 DIAGNOSIS — M797 Fibromyalgia: Secondary | ICD-10-CM | POA: Insufficient documentation

## 2022-08-01 DIAGNOSIS — I130 Hypertensive heart and chronic kidney disease with heart failure and stage 1 through stage 4 chronic kidney disease, or unspecified chronic kidney disease: Secondary | ICD-10-CM | POA: Insufficient documentation

## 2022-08-01 DIAGNOSIS — G2581 Restless legs syndrome: Secondary | ICD-10-CM | POA: Insufficient documentation

## 2022-08-01 LAB — BASIC METABOLIC PANEL
Anion gap: 7 (ref 5–15)
BUN: 25 mg/dL — ABNORMAL HIGH (ref 8–23)
CO2: 24 mmol/L (ref 22–32)
Calcium: 8.7 mg/dL — ABNORMAL LOW (ref 8.9–10.3)
Chloride: 111 mmol/L (ref 98–111)
Creatinine, Ser: 1.05 mg/dL — ABNORMAL HIGH (ref 0.44–1.00)
GFR, Estimated: 52 mL/min — ABNORMAL LOW (ref >60)
Glucose, Bld: 110 mg/dL — ABNORMAL HIGH (ref 70–99)
Potassium: 3.9 mmol/L (ref 3.5–5.1)
Sodium: 142 mmol/L (ref 135–145)

## 2022-08-01 NOTE — Patient Instructions (Signed)
Continue weighing daily and call for an overnight weight gain of 3 pounds or more or a weekly weight gain of more than 5 pounds.   If you have voicemail, please make sure your mailbox is cleaned out so that we may leave a message and please make sure to listen to any voicemails.     

## 2022-08-02 ENCOUNTER — Other Ambulatory Visit: Payer: Self-pay

## 2022-08-14 ENCOUNTER — Ambulatory Visit: Payer: Medicare Other | Admitting: Family

## 2022-08-22 ENCOUNTER — Encounter: Payer: Self-pay | Admitting: Family

## 2022-08-23 ENCOUNTER — Ambulatory Visit: Payer: Medicare Other | Attending: Family | Admitting: Family

## 2022-08-23 ENCOUNTER — Encounter: Payer: Self-pay | Admitting: Family

## 2022-08-23 VITALS — BP 145/41 | HR 71 | Resp 16 | Ht 62.0 in | Wt 111.5 lb

## 2022-08-23 DIAGNOSIS — R42 Dizziness and giddiness: Secondary | ICD-10-CM | POA: Diagnosis not present

## 2022-08-23 DIAGNOSIS — C9110 Chronic lymphocytic leukemia of B-cell type not having achieved remission: Secondary | ICD-10-CM | POA: Diagnosis not present

## 2022-08-23 DIAGNOSIS — N189 Chronic kidney disease, unspecified: Secondary | ICD-10-CM | POA: Insufficient documentation

## 2022-08-23 DIAGNOSIS — Z87440 Personal history of urinary (tract) infections: Secondary | ICD-10-CM | POA: Insufficient documentation

## 2022-08-23 DIAGNOSIS — I130 Hypertensive heart and chronic kidney disease with heart failure and stage 1 through stage 4 chronic kidney disease, or unspecified chronic kidney disease: Secondary | ICD-10-CM | POA: Diagnosis not present

## 2022-08-23 DIAGNOSIS — I10 Essential (primary) hypertension: Secondary | ICD-10-CM

## 2022-08-23 DIAGNOSIS — Z79899 Other long term (current) drug therapy: Secondary | ICD-10-CM | POA: Diagnosis not present

## 2022-08-23 DIAGNOSIS — D649 Anemia, unspecified: Secondary | ICD-10-CM | POA: Diagnosis not present

## 2022-08-23 DIAGNOSIS — I509 Heart failure, unspecified: Secondary | ICD-10-CM | POA: Diagnosis not present

## 2022-08-23 DIAGNOSIS — I5022 Chronic systolic (congestive) heart failure: Secondary | ICD-10-CM

## 2022-08-23 DIAGNOSIS — G2581 Restless legs syndrome: Secondary | ICD-10-CM | POA: Diagnosis not present

## 2022-08-23 DIAGNOSIS — K219 Gastro-esophageal reflux disease without esophagitis: Secondary | ICD-10-CM | POA: Insufficient documentation

## 2022-08-23 DIAGNOSIS — R140 Abdominal distension (gaseous): Secondary | ICD-10-CM | POA: Insufficient documentation

## 2022-08-23 DIAGNOSIS — M797 Fibromyalgia: Secondary | ICD-10-CM | POA: Insufficient documentation

## 2022-08-23 MED ORDER — SACUBITRIL-VALSARTAN 24-26 MG PO TABS: 1.0000 | ORAL_TABLET | Freq: Two times a day (BID) | ORAL | 3 refills | Status: DC

## 2022-08-23 NOTE — Patient Instructions (Addendum)
Continue weighing daily and call for an overnight weight gain of 3 pounds or more or a weekly weight gain of more than 5 pounds.  Begin Entresto one pill in the morning and one in the evening.   Take two furosemide daily.

## 2022-08-23 NOTE — Progress Notes (Signed)
Patient ID: Lindsay [name], female    DOB: 06-25-1934, 88 y.o.   MRN: [id number]  HPI  Lindsay [name] is a 88 y/o female with a history of HTN, CKD, CLL, fibromyalgia, GERD, RLS and heart failure.   Echo report from 05/30/22 reviewed and showed an EF of 45-50% along with mild/moderate MR  Admitted 07/16/22 due to acute onset of worsening dyspnea with associated cough after not taking her diuretic for a few days. Placed on bipap and then weaned off. Initially given IV lasix with transition to oral diuretics. Cardiology consult obtained. 1 unit PRBC's given due to anemia. Discharged after 3 days.   She presents today for a follow-up visit with a chief complaint of moderate fatigue with SOB and wheezing. Describes this as chronic in nature. Has associated dizziness sometimes in the AM and intermittent abdominal distention along with this. She denies any headaches, difficulty sleeping, palpitations, pedal edema, chest pain, cough or weight gain.   Past Medical History:  Diagnosis Date   Arthritis    RHEUMATOID   CHF (congestive heart failure) (HCC)    CLL (chronic lymphocytic leukemia) (HCC)    Edema    MILD ANKLE OCCAS   Fibromyalgia    GERD (gastroesophageal reflux disease)    Hypertension    Kidney stones    RLS (restless legs syndrome)    Past Surgical History:  Procedure Laterality Date   ABDOMINAL HYSTERECTOMY     CATARACT EXTRACTION W/PHACO Left 02/07/2016   Procedure: CATARACT EXTRACTION PHACO AND INTRAOCULAR LENS PLACEMENT ([place]);  Surgeon: Estill Cotta, MD;  Location: [facility];  Service: Ophthalmology;  Laterality: Left;  [place] 01:33 AP% 24.7 CDE 39.57 fluid pack lot # [id number]   EXTRACORPOREAL SHOCK WAVE LITHOTRIPSY Right 07/03/2019   Procedure: EXTRACORPOREAL SHOCK WAVE LITHOTRIPSY (ESWL);  Surgeon: Royston Cowper, MD;  Location: [facility];  Service: Urology;  Laterality: Right;   EYE SURGERY     HIP ARTHROPLASTY Left 04/20/2020   Procedure: ARTHROPLASTY BIPOLAR HIP  (HEMIARTHROPLASTY);  Surgeon: Hessie Knows, MD;  Location: [facility];  Service: Orthopedics;  Laterality: Left;   LITHOTRIPSY     TONSILLECTOMY     Family History  Problem Relation Age of Onset   Cancer Mother        uterine cancer   Social History   Tobacco Use   Smoking status: Never   Smokeless tobacco: Never  Substance Use Topics   Alcohol use: No   Allergies  Allergen Reactions   Demerol [Meperidine]    Septra [Sulfamethoxazole-Trimethoprim]    Prior to Admission medications   Medication Sig Start Date End Date Taking? Authorizing Provider  amitriptyline (ELAVIL) 10 MG tablet Take 20 mg by mouth at bedtime.    [provider]  amLODipine (NORVASC) 5 MG tablet Take 5 mg by mouth daily. 02/20/22   [provider]  cyanocobalamin (,VITAMIN B-12,) 1000 MCG/ML injection Inject into the muscle every 30 (thirty) days. 07/04/21   [provider]  feeding supplement, ENSURE ENLIVE, (ENSURE ENLIVE) LIQD Take 237 mLs by mouth 2 (two) times daily between meals. 04/22/20   [name], Ava, DO  furosemide (LASIX) 20 MG tablet Take 1 tablet (20 mg total) by mouth daily. 05/16/22 05/16/23  [name], Lindsay Rud, MD  HYDROcodone-acetaminophen (NORCO/VICODIN) 5-325 MG tablet Take 1-2 tablets by mouth every 4 (four) hours as needed for moderate pain (pain score 4-6). Patient taking differently: Take 1-2 tablets by mouth every 4 (four) hours as needed for moderate pain (pain score 4-6).  Takes ~twice daily for arthritis 04/22/20   Duanne Guess, PA-C  Multiple Vitamin (MULTIVITAMIN WITH MINERALS) TABS tablet Take 1 tablet by mouth daily. 04/23/20   [name], Ava, DO   Review of Systems  Constitutional:  Positive for fatigue. Negative for appetite change.  HENT:  Negative for congestion, postnasal drip and sore throat.   Eyes: Negative.   Respiratory:  Positive for shortness of breath (at times) and wheezing. Negative for cough and chest tightness.   Cardiovascular:  Negative for chest  pain, palpitations and leg swelling.  Gastrointestinal:  Positive for abdominal distention (at times). Negative for abdominal pain.  Endocrine: Negative.   Genitourinary: Negative.   Musculoskeletal:  Negative for back pain and neck pain.  Skin: Negative.   Allergic/Immunologic: Negative.   Neurological:  Positive for dizziness. Negative for light-headedness and headaches.  Hematological:  Negative for adenopathy. Does not bruise/bleed easily.  Psychiatric/Behavioral:  Negative for dysphoric mood and sleep disturbance (sleeping on towels). The patient is not nervous/anxious.    Vitals:   08/23/22 1257  BP: (!) 145/41  Pulse: 71  Resp: 16  SpO2: 100%    Wt Readings from Last 3 Encounters:  08/23/22 111 lb 8 oz (50.6 kg)  08/01/22 111 lb 2 oz (50.4 kg)  07/19/22 111 lb 1.8 oz (50.4 kg)    Lab Results  Component Value Date   CREATININE 1.05 (H) 08/01/2022   CREATININE 1.26 (H) 07/19/2022   CREATININE 1.20 (H) 07/18/2022    Physical Exam Vitals and nursing note reviewed. Exam conducted with a chaperone present (daughter).  Constitutional:      Appearance: Normal appearance.  HENT:     Head: Normocephalic and atraumatic.  Cardiovascular:     Rate and Rhythm: Normal rate and regular rhythm.     Heart sounds: Normal heart sounds.  Pulmonary:     Effort: Pulmonary effort is normal. No respiratory distress.     Breath sounds: No wheezing or rales.  Abdominal:     General: There is distension.     Palpations: Abdomen is soft.  Musculoskeletal:        General: No tenderness.     Cervical back: Normal range of motion and neck supple.     Right lower leg: No edema.     Left lower leg: No edema.  Skin:    General: Skin is warm and dry.  Neurological:     General: No focal deficit present.     Mental Status: She is alert and oriented to person, place, and time.  Psychiatric:        Mood and Affect: Mood normal.        Behavior: Behavior normal.        Thought Content:  Thought content normal.    Assessment & Plan:  1: Chronic heart failure with mildly reduced ejection fraction- - NYHA class II - euvolemic today - weighing daily; reminded to call for an overnight weight gain of > 2 pounds or a weekly weight gain of > 5 pounds - weight unchanged from last visit here 3 weeks ago although has had fluctuating weights at home - watching her sodium intake - history of recurrent UTI so most likely will not be a candidate for SGLT2 - will increase her furosemide to 2 tablets daily ('40mg'$ ) - Adding Entresto 24/'26mg'$  BID - check BMP next visit - BNP 07/16/22 was 551.3  2: HTN- - BP mildly elevated (145/41); adjusting meds per above - Saw PCP Kary Kos) 08/03/22;  -  BMP 08/01/22 reviewed and showed sodium 142, potassium 3.9, creatinine 1.05 and GFR 52  3: CLL- - intolerant to several chemotherapeutic agents and currently off treatment since October 2022 - saw oncology palliative care ([facility]) 06/30/22   Medication bottles reviewed.   Return in 1 month, sooner if needed.

## 2022-08-24 ENCOUNTER — Other Ambulatory Visit: Payer: Self-pay

## 2022-08-24 ENCOUNTER — Encounter: Payer: Self-pay | Admitting: Family

## 2022-08-30 ENCOUNTER — Ambulatory Visit: Payer: Medicare Other | Admitting: Family

## 2022-09-20 ENCOUNTER — Encounter: Payer: Self-pay | Admitting: Family

## 2022-09-20 ENCOUNTER — Other Ambulatory Visit
Admission: RE | Admit: 2022-09-20 | Discharge: 2022-09-20 | Disposition: A | Discharge status: Home / Self Care | Payer: Medicare Other | Source: Ambulatory Visit | Attending: Family | Admitting: Family

## 2022-09-20 ENCOUNTER — Ambulatory Visit (HOSPITAL_BASED_OUTPATIENT_CLINIC_OR_DEPARTMENT_OTHER): Payer: Medicare Other | Admitting: Family

## 2022-09-20 VITALS — BP 120/35 | HR 66 | Resp 16 | Ht 62.0 in | Wt 111.0 lb

## 2022-09-20 DIAGNOSIS — K219 Gastro-esophageal reflux disease without esophagitis: Secondary | ICD-10-CM | POA: Insufficient documentation

## 2022-09-20 DIAGNOSIS — M797 Fibromyalgia: Secondary | ICD-10-CM | POA: Insufficient documentation

## 2022-09-20 DIAGNOSIS — G2581 Restless legs syndrome: Secondary | ICD-10-CM | POA: Insufficient documentation

## 2022-09-20 DIAGNOSIS — D649 Anemia, unspecified: Secondary | ICD-10-CM | POA: Diagnosis not present

## 2022-09-20 DIAGNOSIS — Z23 Encounter for immunization: Secondary | ICD-10-CM | POA: Insufficient documentation

## 2022-09-20 DIAGNOSIS — I5022 Chronic systolic (congestive) heart failure: Secondary | ICD-10-CM | POA: Insufficient documentation

## 2022-09-20 DIAGNOSIS — R059 Cough, unspecified: Secondary | ICD-10-CM | POA: Insufficient documentation

## 2022-09-20 DIAGNOSIS — Z87440 Personal history of urinary (tract) infections: Secondary | ICD-10-CM | POA: Insufficient documentation

## 2022-09-20 DIAGNOSIS — C9110 Chronic lymphocytic leukemia of B-cell type not having achieved remission: Secondary | ICD-10-CM | POA: Insufficient documentation

## 2022-09-20 DIAGNOSIS — N189 Chronic kidney disease, unspecified: Secondary | ICD-10-CM | POA: Insufficient documentation

## 2022-09-20 DIAGNOSIS — I130 Hypertensive heart and chronic kidney disease with heart failure and stage 1 through stage 4 chronic kidney disease, or unspecified chronic kidney disease: Secondary | ICD-10-CM | POA: Insufficient documentation

## 2022-09-20 DIAGNOSIS — I10 Essential (primary) hypertension: Secondary | ICD-10-CM

## 2022-09-20 DIAGNOSIS — D519 Vitamin B12 deficiency anemia, unspecified: Secondary | ICD-10-CM

## 2022-09-20 LAB — BASIC METABOLIC PANEL
Anion gap: 7 (ref 5–15)
BUN: 56 mg/dL — ABNORMAL HIGH (ref 8–23)
CO2: 22 mmol/L (ref 22–32)
Calcium: 8.8 mg/dL — ABNORMAL LOW (ref 8.9–10.3)
Chloride: 108 mmol/L (ref 98–111)
Creatinine, Ser: 1.71 mg/dL — ABNORMAL HIGH (ref 0.44–1.00)
GFR, Estimated: 29 mL/min — ABNORMAL LOW (ref >60)
Glucose, Bld: 120 mg/dL — ABNORMAL HIGH (ref 70–99)
Potassium: 4.2 mmol/L (ref 3.5–5.1)
Sodium: 137 mmol/L (ref 135–145)

## 2022-09-20 NOTE — Progress Notes (Signed)
Patient ID: Lindsay [name], female    DOB: 1934-06-23, 88 y.o.   MRN: [id number]  HPI  Lindsay [name] is a 88 y/o female with a history of HTN, CKD, CLL, fibromyalgia, GERD, RLS and heart failure.   Echo report from 05/30/22 reviewed and showed an EF of 45-50% along with mild/moderate MR  Admitted 07/16/22 due to acute onset of worsening dyspnea with associated cough after not taking her diuretic for a few days. Placed on bipap and then weaned off. Initially given IV lasix with transition to oral diuretics. Cardiology consult obtained. 1 unit PRBC's given due to anemia. Discharged after 3 days.   She presents today for a follow-up visit with a chief complaint of minimal fatigue upon moderate exertion. Describes this as chronic in nature. She has associated shortness of breath, wheezing, abdominal distention and arthritic pain in her hands. She denies any difficulty sleeping, dizziness, palpitations, pedal edema, chest pain, cough or weight gain.   Has tolerated entresto without known side effects and says that she feels better since she has been taking it.   Past Medical History:  Diagnosis Date   Arthritis    RHEUMATOID   CHF (congestive heart failure) (HCC)    CLL (chronic lymphocytic leukemia) (HCC)    Edema    MILD ANKLE OCCAS   Fibromyalgia    GERD (gastroesophageal reflux disease)    Hypertension    Kidney stones    RLS (restless legs syndrome)    Past Surgical History:  Procedure Laterality Date   ABDOMINAL HYSTERECTOMY     CATARACT EXTRACTION W/PHACO Left 02/07/2016   Procedure: CATARACT EXTRACTION PHACO AND INTRAOCULAR LENS PLACEMENT ([place]);  Surgeon: Estill Cotta, MD;  Location: [facility];  Service: Ophthalmology;  Laterality: Left;  [place] 01:33 AP% 24.7 CDE 39.57 fluid pack lot # [id number]   EXTRACORPOREAL SHOCK WAVE LITHOTRIPSY Right 07/03/2019   Procedure: EXTRACORPOREAL SHOCK WAVE LITHOTRIPSY (ESWL);  Surgeon: Royston Cowper, MD;  Location: [facility];  Service: Urology;   Laterality: Right;   EYE SURGERY     HIP ARTHROPLASTY Left 04/20/2020   Procedure: ARTHROPLASTY BIPOLAR HIP (HEMIARTHROPLASTY);  Surgeon: Hessie Knows, MD;  Location: [facility];  Service: Orthopedics;  Laterality: Left;   LITHOTRIPSY     TONSILLECTOMY     Family History  Problem Relation Age of Onset   Cancer Mother        uterine cancer   Social History   Tobacco Use   Smoking status: Never   Smokeless tobacco: Never  Substance Use Topics   Alcohol use: No   Allergies  Allergen Reactions   Demerol [Meperidine]    Septra [Sulfamethoxazole-Trimethoprim]    Prior to Admission medications   Medication Sig Start Date End Date Taking? Authorizing Provider  amitriptyline (ELAVIL) 10 MG tablet Take 20 mg by mouth at bedtime.   Yes [provider]  amLODipine (NORVASC) 5 MG tablet Take 5 mg by mouth daily. 02/20/22  Yes [provider]  cyanocobalamin (,VITAMIN B-12,) 1000 MCG/ML injection Inject into the muscle every 14 (fourteen) days. 07/04/21  Yes [provider]  cyanocobalamin (VITAMIN B12) 1000 MCG tablet Take 1,000 mcg by mouth daily.   Yes [provider]  feeding supplement, ENSURE ENLIVE, (ENSURE ENLIVE) LIQD Take 237 mLs by mouth 2 (two) times daily between meals. 04/22/20  Yes Swayze, Ava, DO  furosemide (LASIX) 20 MG tablet Take 1 tablet (20 mg total) by mouth daily. Patient taking differently: Take 40 mg by mouth daily. 05/16/22  05/16/23 Yes Wouk, Ailene Rud, MD  HYDROcodone-acetaminophen (NORCO/VICODIN) 5-325 MG tablet Take 1-2 tablets by mouth every 4 (four) hours as needed for moderate pain (pain score 4-6). Patient taking differently: Take 1-2 tablets by mouth every 4 (four) hours as needed for moderate pain (pain score 4-6). Takes ~twice daily for arthritis 04/22/20  Yes Duanne Guess, PA-C  Multiple Vitamin (MULTIVITAMIN WITH MINERALS) TABS tablet Take 1 tablet by mouth daily. 04/23/20  Yes Swayze, Ava, DO  sacubitril-valsartan (ENTRESTO)  24-26 MG Take 1 tablet by mouth 2 (two) times daily. 08/23/22  Yes Alisa Graff, FNP   Review of Systems  Constitutional:  Positive for fatigue. Negative for appetite change.  HENT:  Negative for congestion, postnasal drip and sore throat.   Eyes: Negative.   Respiratory:  Positive for shortness of breath (at times) and wheezing. Negative for cough and chest tightness.   Cardiovascular:  Negative for chest pain, palpitations and leg swelling.  Gastrointestinal:  Positive for abdominal distention (improving). Negative for abdominal pain.  Endocrine: Negative.   Genitourinary: Negative.   Musculoskeletal:  Positive for arthralgias (both hands). Negative for back pain and neck pain.  Skin: Negative.   Allergic/Immunologic: Negative.   Neurological:  Negative for dizziness, light-headedness and headaches.  Hematological:  Negative for adenopathy. Does not bruise/bleed easily.  Psychiatric/Behavioral:  Negative for dysphoric mood and sleep disturbance (sleeping on towels). The patient is not nervous/anxious.    Vitals:   09/20/22 1309  BP: (!) 120/35  Pulse: 66  Resp: 16  SpO2: 100%  Weight: 111 lb (50.3 kg)  Height: '5\' 2"'$  (1.575 m)   Wt Readings from Last 3 Encounters:  09/20/22 111 lb (50.3 kg)  08/23/22 111 lb 8 oz (50.6 kg)  08/01/22 111 lb 2 oz (50.4 kg)   Lab Results  Component Value Date   CREATININE 1.05 (H) 08/01/2022   CREATININE 1.26 (H) 07/19/2022   CREATININE 1.20 (H) 07/18/2022   Physical Exam Vitals and nursing note reviewed. Exam conducted with a chaperone present (daughter).  Constitutional:      Appearance: Normal appearance.  HENT:     Head: Normocephalic and atraumatic.  Cardiovascular:     Rate and Rhythm: Normal rate and regular rhythm.     Heart sounds: Normal heart sounds.  Pulmonary:     Effort: Pulmonary effort is normal. No respiratory distress.     Breath sounds: No wheezing or rales.  Abdominal:     General: There is no distension.      Palpations: Abdomen is soft.  Musculoskeletal:        General: No tenderness.     Cervical back: Normal range of motion and neck supple.     Right lower leg: No edema.     Left lower leg: No edema.  Skin:    General: Skin is warm and dry.  Neurological:     General: No focal deficit present.     Mental Status: She is alert and oriented to person, place, and time.  Psychiatric:        Mood and Affect: Mood normal.        Behavior: Behavior normal.        Thought Content: Thought content normal.    Assessment & Plan:  1: Chronic heart failure with mildly reduced ejection fraction- - NYHA class II - euvolemic today - weighing daily; reminded to call for an overnight weight gain of > 2 pounds or a weekly weight gain of > 5 pounds -  weight stable from last visit here 1 month ago - watching her sodium intake - history of recurrent UTI so most likely will not be a candidate for SGLT2 - on GDMT of entresto - check BMP today discussed increasing entresto next month - BNP 07/16/22 was 551.3 - has received her flu vaccine for this season  2: HTN- - BP looks good (120/35) - will stop amlodipine to allow for room for entresto titration and she is to check her BP every few days and write the readings down - saw PCP Kary Kos) 08/24/22  - BMP 08/01/22 reviewed and showed sodium 142, potassium 3.9, creatinine 1.05 and GFR 52  3: Anemia- - taking oral B12 daily and injection every 2 weeks - hemoglobin 08/17/22 was 8.4    Medication bottles reviewed.   Return  in 1 month, sooner if needed.

## 2022-09-20 NOTE — Patient Instructions (Addendum)
Continue weighing daily and call for an overnight weight gain of 3 pounds or more or a weekly weight gain of more than 5 pounds.   If you have voicemail, please make sure your mailbox is cleaned out so that we may leave a message and please make sure to listen to any voicemails.     Do not take anymore amlodipine. Check blood pressure every few days and write the readings down.

## 2022-09-21 ENCOUNTER — Other Ambulatory Visit: Payer: Self-pay

## 2022-09-21 ENCOUNTER — Telehealth: Payer: Self-pay

## 2022-09-21 NOTE — Telephone Encounter (Addendum)
10:36 AM: Patient's daughter returned call and we reviewed lab results and decline in kidney function/message below from provider. She verbalized understanding and agreed to return for lab work in 2-3 weeks.  She is unsure of availability and will call our office back as soon as possible to give a date and time ffor PAT for lab work.  Georg Ruddle, RN ----- Message from Alisa Graff, [place] sent at 09/21/2022  9:55 AM EDT ----- Kidney function has declined some since starting entresto. Need to recheck these labs in 2-3 weeks unless you are getting them drawn at another office.

## 2022-09-21 NOTE — Telephone Encounter (Deleted)
-----   Message from Alisa Graff, [place] sent at 09/21/2022  9:55 AM EDT ----- Kidney function has declined some since starting entresto. Need to recheck these labs in 2-3 weeks unless you are getting them drawn at another office.

## 2022-09-25 ENCOUNTER — Encounter: Payer: Self-pay | Admitting: Family

## 2022-09-25 ENCOUNTER — Other Ambulatory Visit: Payer: Self-pay | Admitting: Family

## 2022-09-25 DIAGNOSIS — I5022 Chronic systolic (congestive) heart failure: Secondary | ICD-10-CM

## 2022-10-10 ENCOUNTER — Inpatient Hospital Stay: Admission: RE | Admit: 2022-10-10 | Payer: Medicare Other | Source: Ambulatory Visit

## 2022-10-16 ENCOUNTER — Encounter
Admission: RE | Admit: 2022-10-16 | Discharge: 2022-10-16 | Disposition: A | Discharge status: Home / Self Care | Payer: Medicare Other | Source: Ambulatory Visit | Attending: Family | Admitting: Family

## 2022-10-16 ENCOUNTER — Telehealth: Payer: Self-pay

## 2022-10-16 DIAGNOSIS — I5022 Chronic systolic (congestive) heart failure: Secondary | ICD-10-CM | POA: Diagnosis not present

## 2022-10-16 DIAGNOSIS — Z01812 Encounter for preprocedural laboratory examination: Secondary | ICD-10-CM | POA: Insufficient documentation

## 2022-10-16 LAB — BASIC METABOLIC PANEL
Anion gap: 8 (ref 5–15)
BUN: 47 mg/dL — ABNORMAL HIGH (ref 8–23)
CO2: 22 mmol/L (ref 22–32)
Calcium: 8.8 mg/dL — ABNORMAL LOW (ref 8.9–10.3)
Chloride: 111 mmol/L (ref 98–111)
Creatinine, Ser: 1.3 mg/dL — ABNORMAL HIGH (ref 0.44–1.00)
GFR, Estimated: 40 mL/min — ABNORMAL LOW (ref >60)
Glucose, Bld: 107 mg/dL — ABNORMAL HIGH (ref 70–99)
Potassium: 4.8 mmol/L (ref 3.5–5.1)
Sodium: 141 mmol/L (ref 135–145)

## 2022-10-16 NOTE — Telephone Encounter (Addendum)
Reviewed lab results and no medication changes with patient's daughter, Leana Roe. She verbalized understanding and has no questions or concerns at this time. Georg Ruddle, RN ----- Message from Alisa Graff, [place] sent at 10/16/2022  1:27 PM EDT ----- Potassium level is normal and kidney function is improving. Continue medications at this time

## 2022-10-18 ENCOUNTER — Ambulatory Visit: Payer: Medicare Other | Admitting: Family

## 2022-10-18 ENCOUNTER — Other Ambulatory Visit: Payer: Self-pay

## 2022-10-19 ENCOUNTER — Encounter: Payer: Self-pay | Admitting: Family

## 2022-10-24 ENCOUNTER — Encounter: Payer: Self-pay | Admitting: Family

## 2022-10-24 ENCOUNTER — Ambulatory Visit: Payer: Medicare Other | Attending: Family | Admitting: Family

## 2022-10-24 VITALS — BP 135/42 | HR 66 | Resp 16 | Ht 62.0 in | Wt 110.0 lb

## 2022-10-24 DIAGNOSIS — D519 Vitamin B12 deficiency anemia, unspecified: Secondary | ICD-10-CM | POA: Diagnosis not present

## 2022-10-24 DIAGNOSIS — I10 Essential (primary) hypertension: Secondary | ICD-10-CM | POA: Diagnosis not present

## 2022-10-24 DIAGNOSIS — M797 Fibromyalgia: Secondary | ICD-10-CM | POA: Insufficient documentation

## 2022-10-24 DIAGNOSIS — I509 Heart failure, unspecified: Secondary | ICD-10-CM | POA: Insufficient documentation

## 2022-10-24 DIAGNOSIS — C9110 Chronic lymphocytic leukemia of B-cell type not having achieved remission: Secondary | ICD-10-CM | POA: Insufficient documentation

## 2022-10-24 DIAGNOSIS — D649 Anemia, unspecified: Secondary | ICD-10-CM | POA: Diagnosis not present

## 2022-10-24 DIAGNOSIS — Z87440 Personal history of urinary (tract) infections: Secondary | ICD-10-CM | POA: Insufficient documentation

## 2022-10-24 DIAGNOSIS — N189 Chronic kidney disease, unspecified: Secondary | ICD-10-CM | POA: Diagnosis not present

## 2022-10-24 DIAGNOSIS — K219 Gastro-esophageal reflux disease without esophagitis: Secondary | ICD-10-CM | POA: Insufficient documentation

## 2022-10-24 DIAGNOSIS — I130 Hypertensive heart and chronic kidney disease with heart failure and stage 1 through stage 4 chronic kidney disease, or unspecified chronic kidney disease: Secondary | ICD-10-CM | POA: Diagnosis present

## 2022-10-24 DIAGNOSIS — G2581 Restless legs syndrome: Secondary | ICD-10-CM | POA: Insufficient documentation

## 2022-10-24 DIAGNOSIS — I5022 Chronic systolic (congestive) heart failure: Secondary | ICD-10-CM | POA: Diagnosis not present

## 2022-10-24 NOTE — Patient Instructions (Addendum)
Continue weighing daily and call for an overnight weight gain of 3 pounds or more or a weekly weight gain of more than 5 pounds. ? ? ?If you have voicemail, please make sure your mailbox is cleaned out so that we may leave a message and please make sure to listen to any voicemails.  ? ? ?Get compression socks and put them on every morning with removal at bedtime ?

## 2022-10-24 NOTE — Progress Notes (Signed)
Patient ID: Lindsay [name], female    DOB: 1934/04/18, 88 y.o.   MRN: [id number]  HPI  Lindsay [name] is a 88 y/o female with a history of HTN, CKD, CLL, fibromyalgia, GERD, RLS and heart failure.   Echo report from 05/30/22 reviewed and showed an EF of 45-50% along with mild/moderate MR  Admitted 07/16/22 due to acute onset of worsening dyspnea with associated cough after not taking her diuretic for a few days. Placed on bipap and then weaned off. Initially given IV lasix with transition to oral diuretics. Cardiology consult obtained. 1 unit PRBC's given due to anemia. Discharged after 3 days.   She presents today for a follow-up visit with a chief complaint of minimal fatigue upon moderate exertion. Describes this as chronic in nature having been present for several years. She has associated shortness of breath, occasional palpitations, abdominal distention (improving) and hand pain along with this. She denies any difficulty sleeping, dizziness, pedal edema, chest pain, wheezing, cough or weight gain.   Past Medical History:  Diagnosis Date   Arthritis    RHEUMATOID   CHF (congestive heart failure) (HCC)    CLL (chronic lymphocytic leukemia) (HCC)    Edema    MILD ANKLE OCCAS   Fibromyalgia    GERD (gastroesophageal reflux disease)    Hypertension    Kidney stones    RLS (restless legs syndrome)    Past Surgical History:  Procedure Laterality Date   ABDOMINAL HYSTERECTOMY     CATARACT EXTRACTION W/PHACO Left 02/07/2016   Procedure: CATARACT EXTRACTION PHACO AND INTRAOCULAR LENS PLACEMENT ([place]);  Surgeon: Estill Cotta, MD;  Location: [facility];  Service: Ophthalmology;  Laterality: Left;  [place] 01:33 AP% 24.7 CDE 39.57 fluid pack lot # [id number]   EXTRACORPOREAL SHOCK WAVE LITHOTRIPSY Right 07/03/2019   Procedure: EXTRACORPOREAL SHOCK WAVE LITHOTRIPSY (ESWL);  Surgeon: Royston Cowper, MD;  Location: [facility];  Service: Urology;  Laterality: Right;   EYE SURGERY     HIP ARTHROPLASTY  Left 04/20/2020   Procedure: ARTHROPLASTY BIPOLAR HIP (HEMIARTHROPLASTY);  Surgeon: Hessie Knows, MD;  Location: [facility];  Service: Orthopedics;  Laterality: Left;   LITHOTRIPSY     TONSILLECTOMY     Family History  Problem Relation Age of Onset   Cancer Mother        uterine cancer   Social History   Tobacco Use   Smoking status: Never   Smokeless tobacco: Never  Substance Use Topics   Alcohol use: No   Allergies  Allergen Reactions   Demerol [Meperidine]    Septra [Sulfamethoxazole-Trimethoprim]    Prior to Admission medications   Medication Sig Start Date End Date Taking? Authorizing Provider  amitriptyline (ELAVIL) 10 MG tablet Take 20 mg by mouth at bedtime.   Yes [provider]  cyanocobalamin (,VITAMIN B-12,) 1000 MCG/ML injection Inject into the muscle every 14 (fourteen) days. 07/04/21  Yes [provider]  cyanocobalamin (VITAMIN B12) 1000 MCG tablet Take 1,000 mcg by mouth daily.   Yes [provider]  feeding supplement, ENSURE ENLIVE, (ENSURE ENLIVE) LIQD Take 237 mLs by mouth 2 (two) times daily between meals. 04/22/20  Yes Swayze, Ava, DO  furosemide (LASIX) 20 MG tablet Take 1 tablet (20 mg total) by mouth daily. Patient taking differently: Take 40 mg by mouth daily. 05/16/22 05/16/23 Yes Wouk, Ailene Rud, MD  HYDROcodone-acetaminophen (NORCO/VICODIN) 5-325 MG tablet Take 1-2 tablets by mouth every 4 (four) hours as needed for moderate pain (pain score 4-6). Patient taking  differently: Take 1-2 tablets by mouth every 4 (four) hours as needed for moderate pain (pain score 4-6). Takes ~twice daily for arthritis 04/22/20  Yes Duanne Guess, PA-C  Multiple Vitamin (MULTIVITAMIN WITH MINERALS) TABS tablet Take 1 tablet by mouth daily. 04/23/20  Yes Swayze, Ava, DO  sacubitril-valsartan (ENTRESTO) 24-26 MG Take 1 tablet by mouth 2 (two) times daily. 08/23/22  Yes Davonne Baby A, FNP  amLODipine (NORVASC) 5 MG tablet Take 5 mg by mouth  daily. Patient not taking: Reported on 10/24/2022 02/20/22   [provider]    Review of Systems  Constitutional:  Positive for fatigue. Negative for appetite change.  HENT:  Negative for congestion, postnasal drip and sore throat.   Eyes: Negative.   Respiratory:  Positive for shortness of breath (at times). Negative for cough, chest tightness and wheezing.   Cardiovascular:  Positive for palpitations (at times). Negative for chest pain and leg swelling.  Gastrointestinal:  Positive for abdominal distention (improving). Negative for abdominal pain.  Endocrine: Negative.   Genitourinary: Negative.   Musculoskeletal:  Positive for arthralgias (both hands). Negative for back pain and neck pain.  Skin: Negative.   Allergic/Immunologic: Negative.   Neurological:  Negative for dizziness, light-headedness and headaches.  Hematological:  Negative for adenopathy. Does not bruise/bleed easily.  Psychiatric/Behavioral:  Negative for dysphoric mood and sleep disturbance (sleeping on towels). The patient is not nervous/anxious.    Vitals:   10/24/22 1335  BP: (!) 135/42  Pulse: 66  Resp: 16  SpO2: 100%  Weight: 110 lb (49.9 kg)  Height: '5\' 2"'$  (1.575 m)   Wt Readings from Last 3 Encounters:  10/24/22 110 lb (49.9 kg)  09/20/22 111 lb (50.3 kg)  08/23/22 111 lb 8 oz (50.6 kg)   Lab Results  Component Value Date   CREATININE 1.30 (H) 10/16/2022   CREATININE 1.71 (H) 09/20/2022   CREATININE 1.05 (H) 08/01/2022   Physical Exam Vitals and nursing note reviewed. Exam conducted with a chaperone present (daughter).  Constitutional:      Appearance: Normal appearance.  HENT:     Head: Normocephalic and atraumatic.  Cardiovascular:     Rate and Rhythm: Normal rate and regular rhythm.     Heart sounds: Normal heart sounds.  Pulmonary:     Effort: Pulmonary effort is normal. No respiratory distress.     Breath sounds: No wheezing or rales.  Abdominal:     General: There is no  distension.     Palpations: Abdomen is soft.  Musculoskeletal:        General: No tenderness.     Cervical back: Normal range of motion and neck supple.     Right lower leg: No edema.     Left lower leg: No edema.  Skin:    General: Skin is warm and dry.  Neurological:     General: No focal deficit present.     Mental Status: She is alert and oriented to person, place, and time.  Psychiatric:        Mood and Affect: Mood normal.        Behavior: Behavior normal.        Thought Content: Thought content normal.    Assessment & Plan:  1: Chronic heart failure with mildly reduced ejection fraction- - NYHA class II - euvolemic today - weighing daily; reminded to call for an overnight weight gain of > 2 pounds or a weekly weight gain of > 5 pounds - weight down 1 pound  from last visit here 1 month ago - watching her sodium intake - history of recurrent UTI so most likely will not be a candidate for SGLT2 - on GDMT of entresto; DBP low so will not titrate today - BNP 07/16/22 was 551.3 - get compression socks and put them on every morning with removal at bedtime - elevated legs some during the day - has received her flu vaccine for this season  2: HTN- - BP looks good although DBP on the low side (135/42) - saw PCP Kary Kos) 08/24/22; returns in 2 days - BMP 10/16/22 reviewed and showed sodium 141, potassium 4.8, creatinine 1.3 and GFR 40  3: Anemia- - taking oral B12 daily and injection every 2 weeks - hemoglobin 08/17/22 was 8.4   Medication list reviewed.   Return in 3 months, sooner if needed.

## 2022-10-26 ENCOUNTER — Other Ambulatory Visit: Payer: Self-pay

## 2022-11-30 ENCOUNTER — Ambulatory Visit: Payer: Medicare Other | Attending: Family | Admitting: Family

## 2022-11-30 ENCOUNTER — Encounter: Payer: Self-pay | Admitting: Family

## 2022-11-30 VITALS — BP 142/36 | HR 69 | Resp 18 | Wt 112.0 lb

## 2022-11-30 DIAGNOSIS — I10 Essential (primary) hypertension: Secondary | ICD-10-CM

## 2022-11-30 DIAGNOSIS — R0602 Shortness of breath: Secondary | ICD-10-CM | POA: Diagnosis present

## 2022-11-30 DIAGNOSIS — I130 Hypertensive heart and chronic kidney disease with heart failure and stage 1 through stage 4 chronic kidney disease, or unspecified chronic kidney disease: Secondary | ICD-10-CM | POA: Diagnosis not present

## 2022-11-30 DIAGNOSIS — D519 Vitamin B12 deficiency anemia, unspecified: Secondary | ICD-10-CM

## 2022-11-30 DIAGNOSIS — R002 Palpitations: Secondary | ICD-10-CM | POA: Insufficient documentation

## 2022-11-30 DIAGNOSIS — I5023 Acute on chronic systolic (congestive) heart failure: Secondary | ICD-10-CM

## 2022-11-30 DIAGNOSIS — N189 Chronic kidney disease, unspecified: Secondary | ICD-10-CM | POA: Insufficient documentation

## 2022-11-30 NOTE — Progress Notes (Signed)
ReDS Vest / Clip - 11/30/22 1500       ReDS Vest / Clip   Station Marker A    Ruler Value 20    ReDS Value Range Moderate volume overload    ReDS Actual Value 38

## 2022-11-30 NOTE — Patient Instructions (Addendum)
Continue weighing daily and call for an overnight weight gain of 3 pounds or more or a weekly weight gain of more than 5 pounds.   If you have voicemail, please make sure your mailbox is cleaned out so that we may leave a message and please make sure to listen to any voicemails.    Take an extra fluid pill today and then on Friday and Saturday, take 3 fluid pills each day and then on Sunday, you will resume taking 2 tablets daily.

## 2022-11-30 NOTE — Progress Notes (Signed)
Patient ID: Lindsay [name], female    DOB: 1934-08-16, 88 y.o.   MRN: [id number]  HPI  Lindsay [name] is a 88 y/o female with a history of HTN, CKD, CLL, fibromyalgia, GERD, RLS and heart failure.   Echo report from 05/30/22 reviewed and showed an EF of 45-50% along with mild/moderate MR  Admitted 07/16/22 due to acute onset of worsening dyspnea with associated cough after not taking her diuretic for a few days. Placed on bipap and then weaned off. Initially given IV lasix with transition to oral diuretics. Cardiology consult obtained. 1 unit PRBC's given due to anemia. Discharged after 3 days.   She presents today for an acute visit with a chief complaint of moderate shortness of breath with minimal exertion. Describes this as chronic in nature although has worsened over this week. Has associated abdominal distention, palpitations, fatigue slight weight gain along with this. She denies any difficulty sleeping, dizziness, cough, wheezing, chest pain or pedal edema.   No change in her diet or fluid intake.   Past Medical History:  Diagnosis Date   Arthritis    RHEUMATOID   CHF (congestive heart failure) (HCC)    CLL (chronic lymphocytic leukemia) (HCC)    Edema    MILD ANKLE OCCAS   Fibromyalgia    GERD (gastroesophageal reflux disease)    Hypertension    Kidney stones    RLS (restless legs syndrome)    Past Surgical History:  Procedure Laterality Date   ABDOMINAL HYSTERECTOMY     CATARACT EXTRACTION W/PHACO Left 02/07/2016   Procedure: CATARACT EXTRACTION PHACO AND INTRAOCULAR LENS PLACEMENT ([place]);  Surgeon: Estill Cotta, MD;  Location: [facility];  Service: Ophthalmology;  Laterality: Left;  [place] 01:33 AP% 24.7 CDE 39.57 fluid pack lot # [id number]   EXTRACORPOREAL SHOCK WAVE LITHOTRIPSY Right 07/03/2019   Procedure: EXTRACORPOREAL SHOCK WAVE LITHOTRIPSY (ESWL);  Surgeon: Royston Cowper, MD;  Location: [facility];  Service: Urology;  Laterality: Right;   EYE SURGERY     HIP  ARTHROPLASTY Left 04/20/2020   Procedure: ARTHROPLASTY BIPOLAR HIP (HEMIARTHROPLASTY);  Surgeon: Hessie Knows, MD;  Location: [facility] OR[facility];  Service: Orthopedics;  Laterality: Left;   LITHOTRIPSY     TONSILLECTOMY     Family History  Problem Relation Age of Onset   Cancer Mother        uterine cancer   Social History   Tobacco Use   Smoking status: Never   Smokeless tobacco: Never  Substance Use Topics   Alcohol use: No   Allergies  Allergen Reactions   Demerol [Meperidine]    Septra [Sulfamethoxazole-Trimethoprim]      Review of Systems  Constitutional:  Positive for fatigue. Negative for appetite change.  HENT:  Negative for congestion, postnasal drip and sore throat.   Eyes: Negative.   Respiratory:  Positive for shortness of breath (worsening). Negative for cough, chest tightness and wheezing.   Cardiovascular:  Positive for palpitations (at times). Negative for chest pain and leg swelling.  Gastrointestinal:  Positive for abdominal distention (worsening). Negative for abdominal pain.  Endocrine: Negative.   Genitourinary: Negative.   Musculoskeletal:  Positive for arthralgias (both hands). Negative for back pain and neck pain.  Skin: Negative.   Allergic/Immunologic: Negative.   Neurological:  Negative for dizziness, light-headedness and headaches.  Hematological:  Negative for adenopathy. Does not bruise/bleed easily.  Psychiatric/Behavioral:  Negative for dysphoric mood and sleep disturbance (sleeping on towels). The patient is not nervous/anxious.    Vitals:   11/30/22  1526  BP: (!) 142/36  Pulse: 69  Resp: 18  SpO2: 95%  Weight: 112 lb (50.8 kg)   Wt Readings from Last 3 Encounters:  11/30/22 112 lb (50.8 kg)  10/24/22 110 lb (49.9 kg)  09/20/22 111 lb (50.3 kg)   Lab Results  Component Value Date   CREATININE 1.30 (H) 10/16/2022   CREATININE 1.71 (H) 09/20/2022   CREATININE 1.05 (H) 08/01/2022   Physical Exam Vitals and nursing note reviewed. Exam  conducted with a chaperone present (daughter).  Constitutional:      Appearance: Normal appearance.  HENT:     Head: Normocephalic and atraumatic.  Cardiovascular:     Rate and Rhythm: Normal rate and regular rhythm.     Heart sounds: Normal heart sounds.  Pulmonary:     Effort: Pulmonary effort is normal. No respiratory distress.     Breath sounds: No wheezing or rales.  Abdominal:     General: There is distension.     Palpations: Abdomen is soft.  Musculoskeletal:        General: No tenderness.     Cervical back: Normal range of motion and neck supple.     Right lower leg: No edema.     Left lower leg: No edema.  Skin:    General: Skin is warm and dry.  Neurological:     General: No focal deficit present.     Mental Status: She is alert and oriented to person, place, and time.  Psychiatric:        Mood and Affect: Mood normal.        Behavior: Behavior normal.        Thought Content: Thought content normal.    Assessment & Plan:  1: Acute on chronic heart failure with mildly reduced ejection fraction- - NYHA class III - minimally fluid overloaded with weight gain and worsening symptoms - weighing daily; reminded to call for an overnight weight gain of > 2 pounds or a weekly weight gain of > 5 pounds - weight up 2.8 pound from last visit here 5 weeks ago - ReDs clip reading today slightly elevated at 38% - advised to take an additional '20mg'$  lasix for the next 3 days - watching her sodium intake closely - history of recurrent UTI so most likely will not be a candidate for SGLT2 - on GDMT of entresto; consider titrating  - BNP 07/16/22 was 551.3 - has received her flu vaccine for this season  2: HTN- - BP mildly elevated (142/36) - saw PCP Kary Kos) 08/24/22 - BMP done 11/15/22 reviewed and showed sodium 142, potassium 4.6, creatinine 1.5 and GFR 34  3: Anemia- - taking oral B12 daily and injection every 2 weeks - hemoglobin 08/17/22 was 8.4   Medication bottles  reviewed.  Return in 2 months, sooner if symptoms do not improve.

## 2022-12-13 ENCOUNTER — Other Ambulatory Visit: Payer: Self-pay | Admitting: Family

## 2023-01-27 NOTE — Progress Notes (Unsigned)
Patient ID: Lindsay [name], female    DOB: 11-30-1934, 89 y.o.   MRN: [id number]  HPI  Lindsay [name] is a 89 y/o female with a history of HTN, CKD, CLL, fibromyalgia, GERD, RLS and heart failure.   Echo report from 05/30/22 reviewed and showed an EF of 45-50% along with mild/moderate MR  Has not been admitted or been in the ED in the last 6 months.   She presents today for an acute visit with a chief complaint of   Past Medical History:  Diagnosis Date   Arthritis    RHEUMATOID   CHF (congestive heart failure) (HCC)    CLL (chronic lymphocytic leukemia) (HCC)    Edema    MILD ANKLE OCCAS   Fibromyalgia    GERD (gastroesophageal reflux disease)    Hypertension    Kidney stones    RLS (restless legs syndrome)    Past Surgical History:  Procedure Laterality Date   ABDOMINAL HYSTERECTOMY     CATARACT EXTRACTION W/PHACO Left 02/07/2016   Procedure: CATARACT EXTRACTION PHACO AND INTRAOCULAR LENS PLACEMENT ([place]);  Surgeon: Estill Cotta, MD;  Location: [facility];  Service: Ophthalmology;  Laterality: Left;  [place] 01:33 AP% 24.7 CDE 39.57 fluid pack lot # [id number]   EXTRACORPOREAL SHOCK WAVE LITHOTRIPSY Right 07/03/2019   Procedure: EXTRACORPOREAL SHOCK WAVE LITHOTRIPSY (ESWL);  Surgeon: Royston Cowper, MD;  Location: [facility];  Service: Urology;  Laterality: Right;   EYE SURGERY     HIP ARTHROPLASTY Left 04/20/2020   Procedure: ARTHROPLASTY BIPOLAR HIP (HEMIARTHROPLASTY);  Surgeon: Hessie Knows, MD;  Location: [facility];  Service: Orthopedics;  Laterality: Left;   LITHOTRIPSY     TONSILLECTOMY     Family History  Problem Relation Age of Onset   Cancer Mother        uterine cancer   Social History   Tobacco Use   Smoking status: Never   Smokeless tobacco: Never  Substance Use Topics   Alcohol use: No   Allergies  Allergen Reactions   Demerol [Meperidine]    Septra [Sulfamethoxazole-Trimethoprim]      Review of Systems  Constitutional:  Positive for fatigue.  Negative for appetite change.  HENT:  Negative for congestion, postnasal drip and sore throat.   Eyes: Negative.   Respiratory:  Positive for shortness of breath (worsening). Negative for cough, chest tightness and wheezing.   Cardiovascular:  Positive for palpitations (at times). Negative for chest pain and leg swelling.  Gastrointestinal:  Positive for abdominal distention (worsening). Negative for abdominal pain.  Endocrine: Negative.   Genitourinary: Negative.   Musculoskeletal:  Positive for arthralgias (both hands). Negative for back pain and neck pain.  Skin: Negative.   Allergic/Immunologic: Negative.   Neurological:  Negative for dizziness, light-headedness and headaches.  Hematological:  Negative for adenopathy. Does not bruise/bleed easily.  Psychiatric/Behavioral:  Negative for dysphoric mood and sleep disturbance (sleeping on towels). The patient is not nervous/anxious.      Physical Exam Vitals and nursing note reviewed. Exam conducted with a chaperone present (daughter).  Constitutional:      Appearance: Normal appearance.  HENT:     Head: Normocephalic and atraumatic.  Cardiovascular:     Rate and Rhythm: Normal rate and regular rhythm.     Heart sounds: Normal heart sounds.  Pulmonary:     Effort: Pulmonary effort is normal. No respiratory distress.     Breath sounds: No wheezing or rales.  Abdominal:     General: There is distension.  Palpations: Abdomen is soft.  Musculoskeletal:        General: No tenderness.     Cervical back: Normal range of motion and neck supple.     Right lower leg: No edema.     Left lower leg: No edema.  Skin:    General: Skin is warm and dry.  Neurological:     General: No focal deficit present.     Mental Status: She is alert and oriented to person, place, and time.  Psychiatric:        Mood and Affect: Mood normal.        Behavior: Behavior normal.        Thought Content: Thought content normal.    Assessment &  Plan:  1: Acute on chronic heart failure with mildly reduced ejection fraction- - NYHA class III - minimally fluid overloaded with weight gain and worsening symptoms - weighing daily; reminded to call for an overnight weight gain of > 2 pounds or a weekly weight gain of > 5 pounds - weight 112 pound from last visit here 2 months ago - watching her sodium intake closely - history of recurrent UTI so most likely will not be a candidate for SGLT2 - on GDMT of entresto; consider titrating  - BNP 07/16/22 was 551.3 - has received her flu vaccine for this season  2: HTN- - BP  - saw PCP Haywood Filler) 12/14/22 - BMP done 11/15/22 reviewed and showed sodium 142, potassium 4.6, creatinine 1.5 and GFR 34  3: Anemia- - taking oral B12 daily and injection every 2 weeks - hemoglobin 08/17/22 was 8.4   Medication bottles reviewed.

## 2023-01-29 ENCOUNTER — Encounter: Payer: Self-pay | Admitting: Family

## 2023-01-29 ENCOUNTER — Ambulatory Visit: Payer: Medicare Other | Attending: Family | Admitting: Family

## 2023-01-29 VITALS — BP 154/36 | HR 70 | Resp 18 | Wt 107.2 lb

## 2023-01-29 DIAGNOSIS — G2581 Restless legs syndrome: Secondary | ICD-10-CM | POA: Diagnosis not present

## 2023-01-29 DIAGNOSIS — N189 Chronic kidney disease, unspecified: Secondary | ICD-10-CM | POA: Insufficient documentation

## 2023-01-29 DIAGNOSIS — D649 Anemia, unspecified: Secondary | ICD-10-CM | POA: Diagnosis not present

## 2023-01-29 DIAGNOSIS — I10 Essential (primary) hypertension: Secondary | ICD-10-CM | POA: Diagnosis not present

## 2023-01-29 DIAGNOSIS — M797 Fibromyalgia: Secondary | ICD-10-CM | POA: Insufficient documentation

## 2023-01-29 DIAGNOSIS — K219 Gastro-esophageal reflux disease without esophagitis: Secondary | ICD-10-CM | POA: Diagnosis not present

## 2023-01-29 DIAGNOSIS — D519 Vitamin B12 deficiency anemia, unspecified: Secondary | ICD-10-CM

## 2023-01-29 DIAGNOSIS — C9110 Chronic lymphocytic leukemia of B-cell type not having achieved remission: Secondary | ICD-10-CM | POA: Insufficient documentation

## 2023-01-29 DIAGNOSIS — I5022 Chronic systolic (congestive) heart failure: Secondary | ICD-10-CM | POA: Diagnosis not present

## 2023-01-29 DIAGNOSIS — Z87440 Personal history of urinary (tract) infections: Secondary | ICD-10-CM | POA: Diagnosis not present

## 2023-01-29 DIAGNOSIS — I130 Hypertensive heart and chronic kidney disease with heart failure and stage 1 through stage 4 chronic kidney disease, or unspecified chronic kidney disease: Secondary | ICD-10-CM | POA: Diagnosis present

## 2023-01-29 NOTE — Patient Instructions (Signed)
Continue weighing daily and call for an overnight weight gain of 3 pounds or more or a weekly weight gain of more than 5 pounds.   If you have voicemail, please make sure your mailbox is cleaned out so that we may leave a message and please make sure to listen to any voicemails.    If you receive a satisfaction survey regarding the Heart Failure Clinic, please take the time to fill it out. This way we can continue to provide excellent care and make any changes that need to be made.   

## 2023-01-30 ENCOUNTER — Other Ambulatory Visit: Payer: Self-pay

## 2023-02-11 DIAGNOSIS — N1831 Chronic kidney disease, stage 3a: Secondary | ICD-10-CM | POA: Insufficient documentation

## 2023-02-11 DIAGNOSIS — N1832 Chronic kidney disease, stage 3b: Secondary | ICD-10-CM | POA: Insufficient documentation

## 2023-02-19 ENCOUNTER — Encounter: Payer: Medicare Other | Admitting: Family

## 2023-03-16 ENCOUNTER — Other Ambulatory Visit: Payer: Self-pay | Admitting: Family

## 2023-04-10 ENCOUNTER — Other Ambulatory Visit: Payer: Self-pay | Admitting: *Deleted

## 2023-04-10 DIAGNOSIS — C9110 Chronic lymphocytic leukemia of B-cell type not having achieved remission: Secondary | ICD-10-CM

## 2023-04-12 ENCOUNTER — Inpatient Hospital Stay: Payer: Medicare Other | Attending: Internal Medicine | Admitting: Internal Medicine

## 2023-04-12 ENCOUNTER — Encounter: Payer: Self-pay | Admitting: Internal Medicine

## 2023-04-12 ENCOUNTER — Telehealth: Payer: Self-pay

## 2023-04-12 ENCOUNTER — Other Ambulatory Visit: Payer: Self-pay

## 2023-04-12 ENCOUNTER — Inpatient Hospital Stay: Payer: Medicare Other

## 2023-04-12 ENCOUNTER — Inpatient Hospital Stay: Payer: Medicare Other | Admitting: Pharmacist

## 2023-04-12 ENCOUNTER — Other Ambulatory Visit (HOSPITAL_COMMUNITY): Payer: Self-pay

## 2023-04-12 DIAGNOSIS — D630 Anemia in neoplastic disease: Secondary | ICD-10-CM | POA: Diagnosis not present

## 2023-04-12 DIAGNOSIS — Z79899 Other long term (current) drug therapy: Secondary | ICD-10-CM | POA: Insufficient documentation

## 2023-04-12 DIAGNOSIS — C9110 Chronic lymphocytic leukemia of B-cell type not having achieved remission: Secondary | ICD-10-CM

## 2023-04-12 LAB — CMP (CANCER CENTER ONLY)
ALT: 11 U/L (ref 0–44)
AST: 18 U/L (ref 15–41)
Albumin: 4.3 g/dL (ref 3.5–5.0)
Alkaline Phosphatase: 80 U/L (ref 38–126)
Anion gap: 5 (ref 5–15)
BUN: 55 mg/dL — ABNORMAL HIGH (ref 8–23)
CO2: 26 mmol/L (ref 22–32)
Calcium: 8.8 mg/dL — ABNORMAL LOW (ref 8.9–10.3)
Chloride: 106 mmol/L (ref 98–111)
Creatinine: 1.6 mg/dL — ABNORMAL HIGH (ref 0.44–1.00)
GFR, Estimated: 31 mL/min — ABNORMAL LOW (ref >60)
Glucose, Bld: 109 mg/dL — ABNORMAL HIGH (ref 70–99)
Potassium: 4.7 mmol/L (ref 3.5–5.1)
Sodium: 137 mmol/L (ref 135–145)
Total Bilirubin: 0.8 mg/dL (ref 0.3–1.2)
Total Protein: 6.2 g/dL — ABNORMAL LOW (ref 6.5–8.1)

## 2023-04-12 LAB — CBC WITH DIFFERENTIAL (CANCER CENTER ONLY)
Abs Immature Granulocytes: 0.12 K/uL — ABNORMAL HIGH (ref 0.00–0.07)
Basophils Absolute: 0.1 K/uL (ref 0.0–0.1)
Basophils Relative: 0 %
Eosinophils Absolute: 0.8 K/uL — ABNORMAL HIGH (ref 0.0–0.5)
Eosinophils Relative: 1 %
HCT: 26.7 % — ABNORMAL LOW (ref 36.0–46.0)
Hemoglobin: 8.1 g/dL — ABNORMAL LOW (ref 12.0–15.0)
Immature Granulocytes: 0 %
Lymphocytes Relative: 92 %
Lymphs Abs: 66.9 K/uL — ABNORMAL HIGH (ref 0.7–4.0)
MCH: 32.5 pg (ref 26.0–34.0)
MCHC: 30.3 g/dL (ref 30.0–36.0)
MCV: 107.2 fL — ABNORMAL HIGH (ref 80.0–100.0)
Monocytes Absolute: 1.3 K/uL — ABNORMAL HIGH (ref 0.1–1.0)
Monocytes Relative: 2 %
Neutro Abs: 3.7 K/uL (ref 1.7–7.7)
Neutrophils Relative %: 5 %
Platelet Count: 150 K/uL (ref 150–400)
RBC: 2.49 MIL/uL — ABNORMAL LOW (ref 3.87–5.11)
RDW: 14.1 % (ref 11.5–15.5)
Smear Review: NORMAL
WBC Count: 72.8 K/uL (ref 4.0–10.5)
nRBC: 0 % (ref 0.0–0.2)

## 2023-04-12 LAB — IRON AND TIBC
Iron: 76 ug/dL (ref 28–170)
Saturation Ratios: 23 % (ref 10.4–31.8)
TIBC: 325 ug/dL (ref 250–450)
UIBC: 249 ug/dL

## 2023-04-12 LAB — SAMPLE TO BLOOD BANK

## 2023-04-12 LAB — LACTATE DEHYDROGENASE: LDH: 144 U/L (ref 98–192)

## 2023-04-12 LAB — FERRITIN: Ferritin: 23 ng/mL (ref 11–307)

## 2023-04-12 MED ORDER — ZANUBRUTINIB 80 MG PO CAPS
80.0000 mg | ORAL_CAPSULE | Freq: Every day | ORAL | 0 refills | Status: DC
  Filled 2023-04-12: qty 30, 30d supply, fill #0
  Filled 2023-04-12: qty 120, 120d supply, fill #0

## 2023-04-12 NOTE — Telephone Encounter (Signed)
Patient successfully On[employer] and drug education provided by pharmacist. Santiago Glad scheduled to be shipped on 04/13/23 for delivery on 04/16/23 from [facility] to patient's address. Patient knows to call me at [phone] with any questions or concerns regarding receiving medication or if there are any changes in co-pay.    Ardeen Fillers, CPhT Oncology Pharmacy Patient Advocate  [facility]  [phone] (phone) [phone] (fax) 04/12/2023 4:10 PM

## 2023-04-12 NOTE — Telephone Encounter (Signed)
Oral Oncology Patient Advocate Encounter  Prior Authorization for Brukinsa has been approved.    PA# [id number]  Effective dates: 04/12/23 through 12/18/23  Patients co-pay is $1,163.69.   Patient has active PANF Grant to bring co-pay to $0.00.   Ardeen Fillers, CPhT Oncology Pharmacy Patient Advocate  [facility]  [phone] (phone) [phone] (fax) 04/12/2023 2:16 PM

## 2023-04-12 NOTE — Progress Notes (Signed)
Fatigue/weakness: yes Dyspena: no Light headedness: no Blood in stool: no  C/o possible stomach virus. No fever.

## 2023-04-12 NOTE — Telephone Encounter (Signed)
Oral Oncology Patient Advocate Encounter  New authorization   Received notification that prior authorization for Brukinsa is required.   PA submitted on 04/12/23  Key [id number]  Status is pending     Ardeen Fillers, CPhT Oncology Pharmacy Patient Advocate  [facility]  [phone] (phone) [phone] (fax) 04/12/2023 1:03 PM

## 2023-04-12 NOTE — Assessment & Plan Note (Addendum)
#   CLL: [56% OF NUCLEI POSITIVE FOR A 13Q DELETION]; April 2022-PET scan shows significant lymphadenopathy above and below diaphragm; splenomegaly. Discontinued Ibrutinib [hypertensive emergencies] acalabrutinib [leg cramps even with minimum dose].  # Patient clinically has clearly progressive CLL-bulky neck adenopathy; elevated white count 70,000 previously 30-40,000 worsening anemia; platelets normal LDH normal.  # A long discussion with the patient regarding the progressive nature of the disease-I discussed regarding infusions versus alternative tyrosine kinase inhibitor.  Patient declines infusion/rituximab given the concerns of tolerance/side effects.  # Had a lengthy discussion regarding use of alternative tyrosine kinase inhibitor- zanubritinib at 80 mg dose.  Again reviewed the potential side effects including but not limited to diarrhea arthritis rash etc.  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  Check iron studies ferritin B12 folic acid.  Recommend PRBC blood transfusion if hemoglobin less than 8.  No blood transfusion tomorrow.  # Cardiac -history of CHF / elevated blood pressure-overall stable continue monitoring at home.  # Worsening renal function GFR 48-creatinine 1.6.  Check uric acid; Will need Ultrasound kidneys. Defer to nephro/as awaiting evaluation with nephrology next week  Thank you Dr.Hedrick for allowing me to participate in the care of your pleasant patient. Please do not hesitate to contact me with questions or concerns in the interim.  # DISPOSITION: # Add Iron studies; ferritin- # cancel blood tomorrow # follow up in 2 weeks- APP; lab- cbc/cmp; b12; folic acid; HOLD tube; uric acid- D- possible 1 unit # # follow up in 4  weeks- MD; lab- cbc/cmp; HOLD tube; uric acid- D- possible 1 unit- dr.B  # 40 minutes face-to-face with the patient discussing the above plan of care; more than 50% of time spent on prognosis/ natural history;  counseling and coordination.

## 2023-04-12 NOTE — Progress Notes (Signed)
Oral Chemotherapy Clinic [facility]  Telephone:([phone] [phone]  Patient Care Team: Jerl Mina, MD as PCP - General (Family Medicine)   Name of the patient: Lindsay [name]  [id number]  1934/05/30   Date of visit: 04/12/23  HPI: Patient is a 89 y.o. female with CLL. Previously patient has been intolerant to ibrutinib and acalabrutinib. Patient will now try treatment with zanubrutinib.   Reason for Consult: Zanubrutinib oral chemotherapy education.   PAST MEDICAL HISTORY: Past Medical History:  Diagnosis Date   Arthritis    RHEUMATOID   CHF (congestive heart failure)    CLL (chronic lymphocytic leukemia)    Edema    MILD ANKLE OCCAS   Fibromyalgia    GERD (gastroesophageal reflux disease)    Hypertension    Kidney stones    RLS (restless legs syndrome)     HEMATOLOGY/ONCOLOGY HISTORY:  Oncology History Overview Note  # LYMPHOCYTOSIS [25,K]/ANEMIA 9-10]; platelets-N; IgVH Somatic Hypermutation was not detected.56% OF NUCLEI POSITIVE FOR A 13Q DELETION; The phenotype is most consistent with a diagnosis of 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 Low level hypermetabolism consistent with CLL.2. Splenomegaly but no focal splenic lesions.  # April 2022- 13 Q DEL; IGVH- UNMUTATED  # MID April 2022- IBRUTINIB 280 mg x3 days [STOPPED sec ER/hypertensive emergency- 191/129]  # MAY 26th, 2022- start acalabrutinib 100 mg once a day [reduced dose]; intermittent; discontinued  September mid 2022. [Muscle cramps.]       CLL (chronic lymphocytic leukemia)  02/14/2021 Initial Diagnosis   CLL (chronic lymphocytic leukemia) (HCC)   04/07/2021 Cancer Staging   Staging form: Chronic Lymphocytic Leukemia / Small Lymphocytic Lymphoma, AJCC 8th Edition - Clinical: Modified Rai Stage III (Modified Rai risk: High, Binet:  Stage C) - Signed by Earna Coder, MD on 04/07/2021 Stage prefix: Initial diagnosis   12/16/2021 -  Chemotherapy   Patient is on Treatment Plan : CLL - Rituximab q 4 weeks       ALLERGIES:  is allergic to gabapentin, demerol [meperidine], and septra [sulfamethoxazole-trimethoprim].  MEDICATIONS:  Current Outpatient Medications  Medication Sig Dispense Refill   zanubrutinib (BRUKINSA) 80 MG capsule Take 1 capsule (80 mg total) by mouth daily. 30 capsule 0   amitriptyline (ELAVIL) 10 MG tablet Take 20 mg by mouth at bedtime.     cyanocobalamin (,VITAMIN B-12,) 1000 MCG/ML injection Inject into the muscle every 14 (fourteen) days.     cyanocobalamin (VITAMIN B12) 1000 MCG tablet Take 1,000 mcg by mouth daily.     DULoxetine (CYMBALTA) 20 MG capsule Take 20 mg by mouth daily.     ENTRESTO 24-26 MG TAKE ONE TABLET BY MOUTH TWO TIMES DAILY 60 tablet 3   feeding supplement, ENSURE ENLIVE, (ENSURE ENLIVE) LIQD Take 237 mLs by mouth 2 (two) times daily between meals. 237 mL 12   furosemide (LASIX) 20 MG tablet Take 1 tablet (20 mg total) by mouth daily. (Patient taking differently: Take 40 mg by mouth daily.) 30 tablet 11   HYDROcodone-acetaminophen (NORCO) 10-325 MG tablet Take 1 tablet by mouth every 6 (six) hours as needed.     Multiple Vitamin (MULTIVITAMIN WITH MINERALS) TABS tablet Take 1 tablet by mouth daily. (Patient not taking: Reported on 11/30/2022) 30 tablet 0   No current facility-administered medications for this visit.    VITAL SIGNS: There were no vitals taken for this visit. There were no  vitals filed for this visit.  Estimated body mass index is 17.96 kg/m as calculated from the following:   Height as of an earlier encounter on 04/12/23:  (1.575 m).   Weight as of an earlier encounter on 04/12/23: 44.5 kg (98 lb 3.2 oz).  LABS: CBC:    Component Value Date/Time   WBC 72.8 (HH) 04/12/2023 1048   WBC 42.7 (H) 07/19/2022 0359   HGB 8.1 (L) 04/12/2023 1048    HGB 14.1 03/06/2014 2049   HCT 26.7 (L) 04/12/2023 1048   HCT 42.9 03/06/2014 2049   PLT 150 04/12/2023 1048   PLT 240 03/06/2014 2049   MCV 107.2 (H) 04/12/2023 1048   MCV 88 03/06/2014 2049   NEUTROABS 3.7 04/12/2023 1048   NEUTROABS 7.3 (H) 03/06/2014 2049   LYMPHSABS 66.9 (H) 04/12/2023 1048   LYMPHSABS 6.6 (H) 03/06/2014 2049   MONOABS 1.3 (H) 04/12/2023 1048   MONOABS 1.3 (H) 03/06/2014 2049   EOSABS 0.8 (H) 04/12/2023 1048   EOSABS 0.4 03/06/2014 2049   BASOSABS 0.1 04/12/2023 1048   BASOSABS 0.1 03/06/2014 2049   Comprehensive Metabolic Panel:    Component Value Date/Time   NA 137 04/12/2023 1048   NA 138 03/06/2014 2049   K 4.7 04/12/2023 1048   K 3.6 03/06/2014 2049   CL 106 04/12/2023 1048   CL 107 03/06/2014 2049   CO2 26 04/12/2023 1048   CO2 29 03/06/2014 2049   BUN 55 (H) 04/12/2023 1048   BUN 14 03/06/2014 2049   CREATININE 1.60 (H) 04/12/2023 1048   CREATININE 0.75 03/06/2014 2049   GLUCOSE 109 (H) 04/12/2023 1048   GLUCOSE 103 (H) 03/06/2014 2049   CALCIUM 8.8 (L) 04/12/2023 1048   CALCIUM 8.9 03/06/2014 2049   AST 18 04/12/2023 1048   ALT 11 04/12/2023 1048   ALKPHOS 80 04/12/2023 1048   BILITOT 0.8 04/12/2023 1048   PROT 6.2 (L) 04/12/2023 1048   ALBUMIN 4.3 04/12/2023 1048     Present during today's visit: patient and her daughter Lindsay [name] plan: Patient will start once she has medication in hand   Patient Education I spoke with patient and her daughter Lindsay [name] for overview of new oral chemotherapy medication: zanubrutinib   CMP from 04/12/23 assessed, no relevant lab abnormalities. Prescription dose and frequency assessed.   Administration: Counseled patient on administration, dosing, side effects, monitoring, drug-food interactions, safe handling, storage, and disposal. Patient will take 1 capsule (80 mg total) by mouth daily.   **MD starting patient on a reduced dose due to previous BTK intolerance.   Side Effects: Side effects  include but not limited to: rash, muscle cramps, decreased wbc/hgb/plt.    Drug-drug Interactions (DDI): Duloxetine: Zanubrutinib may enhance the antiplatelet effect of Agents with Antiplatelet Properties, such as duloxetine. Monitor patients for signs and symptoms of bleeding. No baseline dose adjustment needed.   Adherence: After discussion with patient no patient barriers to medication adherence identified.  Reviewed with patient importance of keeping a medication schedule and plan for any missed doses.  Ms. Frampton and her daughter Lindsay [name] voiced understanding and appreciation. All questions answered. Medication handout provided.  Provided patient with Oral Chemotherapy Navigation Clinic phone number. Patient knows to call the office with questions or concerns. Oral Chemotherapy Navigation Clinic will continue to follow.  Patient expressed understanding and was in agreement with this plan. She also understands that She can call clinic at any time with any questions, concerns, or complaints.   Medication Access Issues:  Romeo Apple is working to [employer] for patient  Follow-up plan: RTC in 2 weeks  Thank you for allowing me to participate in the care of this patient.   Time Total: 15 mins  Visit consisted of counseling and education on dealing with issues of symptom management in the setting of serious and potentially life-threatening illness.Greater than 50%  of this time was spent counseling and coordinating care related to the above assessment and plan.  Signed by: Remi Haggard, PharmD, BCPS, Nolon Bussing, CPP Hematology/Oncology Clinical Pharmacist Practitioner [place]/DB/AP Oral Chemotherapy Navigation Clinic [phone]  04/12/2023 12:21 PM

## 2023-04-12 NOTE — Progress Notes (Signed)
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 Low level hypermetabolism consistent with CLL.2. Splenomegaly but no focal splenic lesions.  # April 2022- 13 Q DEL; IGVH- UNMUTATED  # MID April 2022- IBRUTINIB 280 mg x3 days [STOPPED sec ER/hypertensive emergency- 191/129]  # MAY 26th, 2022- start acalabrutinib 100 mg once a day [reduced dose]; intermittent; discontinued  September mid 2022. [Muscle cramps.]  # MAY 1st week- 2024- Zanubrutinib 80 mg        CLL (chronic lymphocytic leukemia)  02/14/2021 Initial Diagnosis   CLL (chronic lymphocytic leukemia) (HCC)   04/07/2021 Cancer Staging   Staging form: Chronic Lymphocytic Leukemia / Small Lymphocytic Lymphoma, AJCC 8th Edition - Clinical: Modified Rai Stage III (Modified Rai risk: High, Binet: Stage C) - Signed by Earna Coder, MD on 04/07/2021 Stage prefix: Initial diagnosis   12/16/2021 -  Chemotherapy   Patient is on Treatment Plan : CLL - Rituximab q 4 weeks        HISTORY OF PRESENTING ILLNESS: Frail-appearing Caucasian female patient/accompanied by daughter.  Ambulating independently.  [name] 89 y.o.  female symptomatic CLL currently on on surveillance because of intolerance to ibrutinib/acalabrutinib is here for follow-up.  Patient was last seen approximately 2 years ago.  Patient at the time given progressive CLL started on tyrosine kinase inhibitor.  However given poor tolerance discontinue  further therapy.  More recently patient noted to have worsening dyspnea on exertion.  Weight loss.  Denies any blood in stools or black or stools.  Also noticed significant increasing size of the neck lymph nodes/underarm lymph nodes.  Review of Systems  Constitutional:  Positive for malaise/fatigue and weight loss. Negative for chills, diaphoresis and fever.  HENT:  Negative for nosebleeds and sore throat.   Eyes:  Negative for double vision.  Respiratory:  Negative for cough, hemoptysis, sputum production, shortness of breath and wheezing.   Cardiovascular:  Negative for chest pain, palpitations, orthopnea and leg swelling.  Gastrointestinal:  Negative for abdominal pain, constipation, diarrhea, heartburn, melena, nausea and vomiting.  Genitourinary:  Negative for dysuria, frequency and urgency.  Musculoskeletal:  Positive for joint pain. Negative for back pain.  Skin: Negative.  Negative for itching and rash.  Neurological:  Negative for dizziness, tingling, focal weakness, weakness and headaches.  Endo/Heme/Allergies:  Bruises/bleeds easily.  Psychiatric/Behavioral:  Negative for depression. The patient is not nervous/anxious and does not have insomnia.      MEDICAL HISTORY:  Past Medical History:  Diagnosis Date   Arthritis    RHEUMATOID   CHF (congestive heart failure)    CLL (chronic lymphocytic leukemia)    Edema    MILD ANKLE OCCAS   Fibromyalgia    GERD (gastroesophageal reflux disease)    Hypertension    Kidney stones    RLS (restless legs syndrome)     SURGICAL HISTORY: Past Surgical History:  Procedure Laterality Date   ABDOMINAL HYSTERECTOMY     CATARACT EXTRACTION W/PHACO Left 02/07/2016   Procedure: CATARACT EXTRACTION PHACO AND INTRAOCULAR LENS PLACEMENT (IOC);  Surgeon: Sallee Lange, MD;  Location: [facility] ORS;  Service: Ophthalmology;  Laterality: Left;  [place] 01:33 AP% 24.7 CDE 39.57 fluid pack lot # [id number]   EXTRACORPOREAL SHOCK WAVE LITHOTRIPSY  Right 07/03/2019   Procedure: EXTRACORPOREAL SHOCK WAVE LITHOTRIPSY (ESWL);  Surgeon: Orson Ape, MD;  Location: [facility] ORS;  Service: Urology;  Laterality: Right;   EYE SURGERY     HIP ARTHROPLASTY Left 04/20/2020   Procedure: ARTHROPLASTY BIPOLAR HIP (HEMIARTHROPLASTY);  Surgeon: Kennedy Bucker, MD;  Location: [facility] ORS;  Service: Orthopedics;  Laterality: Left;   LITHOTRIPSY     TONSILLECTOMY      SOCIAL HISTORY: Social History   Socioeconomic History   Marital status: Widowed    Spouse name: Not on file   Number of children: Not on file   Years of education: Not on file   Highest education level: Not on file  Occupational History   Not on file  Tobacco Use   Smoking status: Never   Smokeless tobacco: Never  Substance and Sexual Activity   Alcohol use: No   Drug use: Never   Sexual activity: Not on file  Other Topics Concern   Not on file  Social History Narrative   Lives with daughter, in [place]. 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    Social Determinants of Health   Financial Resource Strain: Not on file  Food Insecurity: Not on file  Transportation Needs: Not on file  Physical Activity: Not on file  Stress: Not on file  Social Connections: Not on file  Intimate Partner Violence: Not on file    FAMILY HISTORY: Family History  Problem Relation Age of Onset   Cancer Mother        uterine cancer    ALLERGIES:  is allergic to gabapentin, demerol [meperidine], and septra [sulfamethoxazole-trimethoprim].  MEDICATIONS:  Current Outpatient Medications  Medication Sig Dispense Refill   amitriptyline (ELAVIL) 10 MG tablet Take 20 mg by mouth at bedtime.     cyanocobalamin (,VITAMIN B-12,) 1000 MCG/ML injection Inject into the muscle every 14 (fourteen) days.     cyanocobalamin (VITAMIN B12) 1000 MCG tablet Take 1,000 mcg by mouth daily.     DULoxetine (CYMBALTA) 20 MG capsule Take 20 mg by mouth daily.     ENTRESTO 24-26 MG TAKE ONE TABLET BY MOUTH  TWO TIMES DAILY 60 tablet 3   feeding supplement, ENSURE ENLIVE, (ENSURE ENLIVE) LIQD Take 237 mLs by mouth 2 (two) times daily between meals. 237 mL 12   furosemide (LASIX) 20 MG tablet Take 1 tablet (20 mg total) by mouth daily. (Patient taking differently: Take 40 mg by mouth daily.) 30 tablet 11   HYDROcodone-acetaminophen (NORCO) 10-325 MG tablet Take 1 tablet by mouth every 6 (six) hours as needed.     Multiple Vitamin (MULTIVITAMIN WITH MINERALS) TABS tablet Take 1 tablet by mouth daily. (Patient not taking: Reported on 11/30/2022) 30 tablet 0   zanubrutinib (BRUKINSA) 80 MG capsule Take 1 capsule (80 mg total) by mouth daily. 30 capsule 0   No current facility-administered medications for this visit.      Marland Kitchen  PHYSICAL EXAMINATION: ECOG PERFORMANCE STATUS: 0 - Asymptomatic  Vitals:   04/12/23 1053  BP: (!) 160/41  Pulse: 66  Temp: (!) 96.9 F (36.1 C)  SpO2: 100%   Filed Weights   04/12/23 1053  Weight: 98 lb 3.2 oz (44.5 kg)   Bulky bilateral neck adenopathy; underarm adenopathy.  Physical Exam HENT:     Head: Normocephalic and atraumatic.  Mouth/Throat:     Pharynx: No oropharyngeal exudate.  Eyes:     Pupils: Pupils are equal, round, and reactive to light.  Cardiovascular:     Rate and Rhythm: Normal rate and regular rhythm.  Pulmonary:     Effort: Pulmonary effort is normal. No respiratory distress.     Breath sounds: Normal breath sounds. No wheezing.  Abdominal:     General: Bowel sounds are normal. There is no distension.     Palpations: Abdomen is soft. There is no mass.     Tenderness: There is no abdominal tenderness. There is no guarding or rebound.  Musculoskeletal:        General: No tenderness. Normal range of motion.     Cervical back: Normal range of motion and neck supple.  Skin:    General: Skin is warm.     Comments: Chronic multiple bruises noted.  Neurological:     Mental Status: She is alert and oriented to person, place, and time.   Psychiatric:        Mood and Affect: Affect normal.      LABORATORY DATA:  I have reviewed the data as listed Lab Results  Component Value Date   WBC 72.8 (HH) 04/12/2023   HGB 8.1 (L) 04/12/2023   HCT 26.7 (L) 04/12/2023   MCV 107.2 (H) 04/12/2023   PLT 150 04/12/2023   Recent Labs    06/30/22 1256 07/16/22 2014 07/17/22 0523 09/20/22 1337 10/16/22 1237 04/12/23 1048  NA 140 140   < > 137 141 137  K 4.3 4.3   < > 4.2 4.8 4.7  CL 111 109   < > 108 111 106  CO2 23 22   < > 22 22 26   GLUCOSE 123* 173*   < > 120* 107* 109*  BUN 23 29*   < > 56* 47* 55*  CREATININE 0.97 1.24*   < > 1.71* 1.30* 1.60*  CALCIUM 8.6* 9.1   < > 8.8* 8.8* 8.8*  GFRNONAA 57* 42*   < > 29* 40* 31*  PROT 6.0* 6.6  --   --   --  6.2*  ALBUMIN 3.9 4.3  --   --   --  4.3  AST 21 28  --   --   --  18  ALT 11 13  --   --   --  11  ALKPHOS 89 102  --   --   --  80  BILITOT 0.9 1.0  --   --   --  0.8  BILIDIR  --  0.2  --   --   --   --   IBILI  --  0.8  --   --   --   --    < > = values in this interval not displayed.    RADIOGRAPHIC STUDIES: I have personally reviewed the radiological images as listed and agreed with the findings in the report. No results found.  ASSESSMENT & PLAN:   CLL (chronic lymphocytic leukemia) (HCC) # CLL: [56% OF NUCLEI POSITIVE FOR A 13Q DELETION]; April 2022-PET scan shows significant lymphadenopathy above and below diaphragm; splenomegaly. Discontinued Ibrutinib [hypertensive emergencies] acalabrutinib [leg cramps even with minimum dose].  # Patient clinically has clearly progressive CLL-bulky neck adenopathy; elevated white count 70,000 previously 30-40,000 worsening anemia; platelets normal LDH normal.  # A long discussion with the patient regarding the progressive nature of the disease-I discussed regarding infusions versus alternative tyrosine kinase inhibitor.  Patient  declines infusion/rituximab given the concerns of tolerance/side effects.  # Had a lengthy  discussion regarding use of alternative tyrosine kinase inhibitor- zanubritinib at 80 mg dose.  Again reviewed the potential side effects including but not limited to diarrhea arthritis rash etc.  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  Check iron studies ferritin B12 folic acid.  Recommend PRBC blood transfusion if hemoglobin less than 8.  No blood transfusion tomorrow.  # Cardiac -history of CHF / elevated blood pressure-overall stable continue monitoring at home.  # Worsening renal function GFR 48-creatinine 1.6.  Check uric acid; Will need Ultrasound kidneys. Defer to nephro/as awaiting evaluation with nephrology next week  Thank you Dr.Hedrick for allowing me to participate in the care of your pleasant patient. Please do not hesitate to contact me with questions or concerns in the interim.  # DISPOSITION: # Add Iron studies; ferritin- # cancel blood tomorrow # follow up in 2 weeks- APP; lab- cbc/cmp; b12; folic acid; HOLD tube; uric acid- D- possible 1 unit # # follow up in 4  weeks- MD; lab- cbc/cmp; HOLD tube; uric acid- D- possible 1 unit- dr.B  All questions were answered. The patient knows to call the clinic with any problems, questions or concerns.    Earna Coder, MD 04/12/2023 12:50 PM

## 2023-04-12 NOTE — Progress Notes (Signed)
Rcvd critical lab; WBC's 72.8 Read back. Dr Donneta Romberg notified

## 2023-04-12 NOTE — Telephone Encounter (Signed)
Oral Oncology Patient Advocate Encounter   Was successful in securing patient an $3,250 grant from [employer]) to provide copayment coverage for Brukinsa.  This will keep the out of pocket expense at $0.     I have spoken with the patient.    The billing information is as follows and has been shared with [facility].   Member ID: [id number] Group ID: [id number] RxBin: [id number] Dates of Eligibility: 01/12/23 through 04/10/24  Fund:  Chronic Lymphocytic Leukemia   Ardeen Fillers, CPhT Oncology Pharmacy Patient Advocate  [facility]  [phone] (phone) [phone] (fax) 04/12/2023 1:06 PM

## 2023-04-13 ENCOUNTER — Other Ambulatory Visit: Payer: Self-pay

## 2023-04-13 ENCOUNTER — Inpatient Hospital Stay: Payer: Medicare Other

## 2023-04-25 ENCOUNTER — Telehealth: Payer: Self-pay | Admitting: Nurse Practitioner

## 2023-04-25 ENCOUNTER — Inpatient Hospital Stay: Payer: Medicare Other | Admitting: Medical Oncology

## 2023-04-25 ENCOUNTER — Inpatient Hospital Stay: Payer: Medicare Other | Attending: Internal Medicine

## 2023-04-25 DIAGNOSIS — C9110 Chronic lymphocytic leukemia of B-cell type not having achieved remission: Secondary | ICD-10-CM | POA: Insufficient documentation

## 2023-04-25 DIAGNOSIS — Z79899 Other long term (current) drug therapy: Secondary | ICD-10-CM | POA: Insufficient documentation

## 2023-04-25 NOTE — Telephone Encounter (Signed)
I tried to call patient and inquire more about her symptoms but there was no answer:  My concern is regarding her potential need for a blood transfusion. Her hemoglobin counts have been trending lower and she is currently scheduled for a blood transfusion on this Friday. She will need repeated labs prior to that appointment so we really would need to see her today or tomorrow.

## 2023-04-25 NOTE — Telephone Encounter (Signed)
Patients daughter called to let us know she isn't feeling well and wanted to reschedule lab/APP appointment for today to tomorrow. No available APP tomorrow. She asked to change appointments to Monday. Please advise.

## 2023-04-26 ENCOUNTER — Other Ambulatory Visit: Payer: Self-pay | Admitting: *Deleted

## 2023-04-26 ENCOUNTER — Telehealth: Payer: Self-pay | Admitting: *Deleted

## 2023-04-26 ENCOUNTER — Ambulatory Visit: Payer: Medicare Other

## 2023-04-26 ENCOUNTER — Inpatient Hospital Stay: Payer: Medicare Other

## 2023-04-26 ENCOUNTER — Other Ambulatory Visit: Payer: Medicare Other

## 2023-04-26 ENCOUNTER — Ambulatory Visit: Payer: Medicare Other | Admitting: Nurse Practitioner

## 2023-04-26 ENCOUNTER — Inpatient Hospital Stay: Payer: Medicare Other | Admitting: Nurse Practitioner

## 2023-04-26 DIAGNOSIS — C9110 Chronic lymphocytic leukemia of B-cell type not having achieved remission: Secondary | ICD-10-CM

## 2023-04-26 DIAGNOSIS — D519 Vitamin B12 deficiency anemia, unspecified: Secondary | ICD-10-CM

## 2023-04-26 DIAGNOSIS — Z79899 Other long term (current) drug therapy: Secondary | ICD-10-CM | POA: Diagnosis not present

## 2023-04-26 LAB — CBC WITH DIFFERENTIAL (CANCER CENTER ONLY)
Abs Immature Granulocytes: 0.3 10*3/uL — ABNORMAL HIGH (ref 0.00–0.07)
Basophils Absolute: 0 10*3/uL (ref 0.0–0.1)
Basophils Relative: 0 %
Eosinophils Absolute: 0.9 10*3/uL — ABNORMAL HIGH (ref 0.0–0.5)
Eosinophils Relative: 1 %
HCT: 24.9 % — ABNORMAL LOW (ref 36.0–46.0)
Hemoglobin: 7.3 g/dL — ABNORMAL LOW (ref 12.0–15.0)
Immature Granulocytes: 0 %
Lymphocytes Relative: 93 %
Lymphs Abs: 118.5 10*3/uL — ABNORMAL HIGH (ref 0.7–4.0)
MCH: 31.3 pg (ref 26.0–34.0)
MCHC: 29.3 g/dL — ABNORMAL LOW (ref 30.0–36.0)
MCV: 106.9 fL — ABNORMAL HIGH (ref 80.0–100.0)
Monocytes Absolute: 0.8 10*3/uL (ref 0.1–1.0)
Monocytes Relative: 1 %
Neutro Abs: 5.6 10*3/uL (ref 1.7–7.7)
Neutrophils Relative %: 5 %
Platelet Count: 164 10*3/uL (ref 150–400)
RBC: 2.33 MIL/uL — ABNORMAL LOW (ref 3.87–5.11)
RDW: 14 % (ref 11.5–15.5)
Smear Review: NORMAL
WBC Count: 126.1 10*3/uL (ref 4.0–10.5)
nRBC: 0 % (ref 0.0–0.2)

## 2023-04-26 LAB — CMP (CANCER CENTER ONLY)
ALT: 10 U/L (ref 0–44)
AST: 15 U/L (ref 15–41)
Albumin: 3.9 g/dL (ref 3.5–5.0)
Alkaline Phosphatase: 86 U/L (ref 38–126)
Anion gap: 12 (ref 5–15)
BUN: 83 mg/dL — ABNORMAL HIGH (ref 8–23)
CO2: 19 mmol/L — ABNORMAL LOW (ref 22–32)
Calcium: 8.3 mg/dL — ABNORMAL LOW (ref 8.9–10.3)
Chloride: 103 mmol/L (ref 98–111)
Creatinine: 2.29 mg/dL — ABNORMAL HIGH (ref 0.44–1.00)
GFR, Estimated: 20 mL/min — ABNORMAL LOW (ref >60)
Glucose, Bld: 134 mg/dL — ABNORMAL HIGH (ref 70–99)
Potassium: 4.7 mmol/L (ref 3.5–5.1)
Sodium: 134 mmol/L — ABNORMAL LOW (ref 135–145)
Total Bilirubin: 0.5 mg/dL (ref 0.3–1.2)
Total Protein: 6.6 g/dL (ref 6.5–8.1)

## 2023-04-26 LAB — URIC ACID: Uric Acid, Serum: 14.6 mg/dL — ABNORMAL HIGH (ref 2.5–7.1)

## 2023-04-26 LAB — FOLATE: Folate: 20.4 ng/mL (ref >5.9)

## 2023-04-26 LAB — TYPE AND SCREEN
ABO/RH(D): O POS
DAT, IgG: POSITIVE

## 2023-04-26 NOTE — Telephone Encounter (Signed)
Attempted to reach patient's daughter to discuss today's lab results and determine if patient is on Brukinsa. Pt's hgb 7.3; critical wbc count at 126. Creat 2.29.

## 2023-04-26 NOTE — Telephone Encounter (Signed)
I attempted to reach patient's daughter to discuss apts. I left a detailed vm to encouraged daughter to have pt come in today to have labs checked and possible blood on Friday.

## 2023-04-26 NOTE — Telephone Encounter (Signed)
Critical wbc 126.1 today. Relayed to APP. Hgb is 7.3 today.

## 2023-04-26 NOTE — Telephone Encounter (Signed)
Daughter returned phone call. Agreeable to lab only today. Add provider tomorrow with sch blood transf apt. We will contact daughter with lab results

## 2023-04-26 NOTE — Telephone Encounter (Addendum)
1350- I spoke with patient's daughter. I confirmed pt rcvd Brukinsa on 4/26 and pt started drug on 4/27. I provided pt's prelim lab results to daughter. I explained that pt will need 1 unit of blood tomorrow. The blood has been ordered and will be ready for patient as scheduled tomorrow. Pt is on the schedule to see Maralyn Sago, [place] prior to blood transfusion. Daughter made aware that pt's hgb is 7.3; wbc critical result at 126 and creat. Level at 2.29. per daughter, pt has heart failure and is on fluid restrictions. She reports decrease appetite. PCP started pt on megace 2 weeks ago to help with pt's decrease appetite.

## 2023-04-27 ENCOUNTER — Inpatient Hospital Stay: Payer: Medicare Other

## 2023-04-27 ENCOUNTER — Ambulatory Visit: Payer: Medicare Other

## 2023-04-27 ENCOUNTER — Inpatient Hospital Stay: Payer: Medicare Other | Admitting: Medical Oncology

## 2023-04-27 ENCOUNTER — Other Ambulatory Visit: Payer: Self-pay | Admitting: *Deleted

## 2023-04-27 ENCOUNTER — Encounter: Payer: Self-pay | Admitting: Medical Oncology

## 2023-04-27 VITALS — BP 150/33 | HR 70 | Temp 97.0°F | Wt 98.2 lb

## 2023-04-27 DIAGNOSIS — E790 Hyperuricemia without signs of inflammatory arthritis and tophaceous disease: Secondary | ICD-10-CM

## 2023-04-27 DIAGNOSIS — C9110 Chronic lymphocytic leukemia of B-cell type not having achieved remission: Secondary | ICD-10-CM

## 2023-04-27 DIAGNOSIS — D519 Vitamin B12 deficiency anemia, unspecified: Secondary | ICD-10-CM

## 2023-04-27 LAB — TYPE AND SCREEN: Antibody Screen: POSITIVE

## 2023-04-27 LAB — PREPARE RBC (CROSSMATCH)

## 2023-04-27 LAB — VITAMIN B12: Vitamin B-12: 1130 pg/mL — ABNORMAL HIGH (ref 180–914)

## 2023-04-27 MED ORDER — DIPHENHYDRAMINE HCL 25 MG PO CAPS
25.0000 mg | ORAL_CAPSULE | Freq: Once | ORAL | Status: AC
  Administered 2023-04-27: 25 mg via ORAL
  Filled 2023-04-27: qty 1

## 2023-04-27 MED ORDER — SODIUM CHLORIDE 0.9% IV SOLUTION
250.0000 mL | Freq: Once | INTRAVENOUS | Status: AC
  Administered 2023-04-27: 250 mL via INTRAVENOUS
  Filled 2023-04-27: qty 250

## 2023-04-27 MED ORDER — ALLOPURINOL 100 MG PO TABS
200.0000 mg | ORAL_TABLET | Freq: Every day | ORAL | 0 refills | Status: DC
Start: 2023-04-27 — End: 2023-05-23

## 2023-04-27 MED ORDER — SODIUM CHLORIDE 0.9% FLUSH
10.0000 mL | INTRAVENOUS | Status: AC | PRN
  Administered 2023-04-27: 10 mL
  Filled 2023-04-27: qty 10

## 2023-04-27 NOTE — Patient Instructions (Signed)
Blood Transfusion, Adult, Care After The following information offers guidance on how to care for yourself after your procedure. Your health care provider may also give you more specific instructions. If you have problems or questions, contact your health care provider. What can I expect after the procedure? After the procedure, it is common to have: Bruising and soreness where the IV was inserted. A headache. Follow these instructions at home: IV insertion site care     Follow instructions from your health care provider about how to take care of your IV insertion site. Make sure you: Wash your hands with soap and water for at least 20 seconds before and after you change your bandage (dressing). If soap and water are not available, use hand sanitizer. Change your dressing as told by your health care provider. Check your IV insertion site every day for signs of infection. Check for: Redness, swelling, or pain. Bleeding from the site. Warmth. Pus or a bad smell. General instructions Take over-the-counter and prescription medicines only as told by your health care provider. Rest as told by your health care provider. Return to your normal activities as told by your health care provider. Keep all follow-up visits. Lab tests may need to be done at certain periods to recheck your blood counts. Contact a health care provider if: You have itching or red, swollen areas of skin (hives). You have a fever or chills. You have pain in the head, back, or chest. You feel anxious or you feel weak after doing your normal activities. You have redness, swelling, warmth, or pain around the IV insertion site. You have blood coming from the IV insertion site that does not stop with pressure. You have pus or a bad smell coming from your IV insertion site. If you received your blood transfusion in an outpatient setting, you will be told whom to contact to report any reactions. Get help right away if: You  have symptoms of a serious allergic or immune system reaction, including: Trouble breathing or shortness of breath. Swelling of the face, feeling flushed, or widespread rash. Dark urine or blood in the urine. Fast heartbeat. These symptoms may be an emergency. Get help right away. Call 911. Do not wait to see if the symptoms will go away. Do not drive yourself to the hospital. Summary Bruising and soreness around the IV insertion site are common. Check your IV insertion site every day for signs of infection. Rest as told by your health care provider. Return to your normal activities as told by your health care provider. Get help right away for symptoms of a serious allergic or immune system reaction to the blood transfusion. This information is not intended to replace advice given to you by your health care provider. Make sure you discuss any questions you have with your health care provider. Document Revised: 03/03/2022 Document Reviewed: 03/03/2022 Elsevier Patient Education  2023 Elsevier Inc.  

## 2023-04-27 NOTE — Progress Notes (Signed)
Hematology and Oncology Follow Up Visit  Lindsay [name] [id number] [birth year] 89 y.o. 04/27/2023  Past Medical History:  Diagnosis Date   Arthritis    RHEUMATOID   CHF (congestive heart failure) (HCC)    CLL (chronic lymphocytic leukemia) (HCC)    Edema    MILD ANKLE OCCAS   Fibromyalgia    GERD (gastroesophageal reflux disease)    Hypertension    Kidney stones    RLS (restless legs syndrome)     Principle Diagnosis:  CLL  Current Therapy:   Zanubrutinib 80 mg capsule once daily started 03/2023  PriorTherapy:   Ibrutib d/c due to hypertensive emergencies Acalabrutinib d/c due to leg cramps at low dose    Interim History:  Lindsay [name] is back for follow-up for consideration of blood products for symptomatic anemia. She presents with her daughter.   She has been seen and followed for her CLL. Had a blood transfusion last year and did really well after. Symptoms of her CLL started affecting her again about a month or two ago. Decision was made to trial Zanubrutinib due to progression of her Leukocytosis, anemia, thrombocytopenia and bulky neck disease.   She reports that she is tolerating the medication well other than some fatigue. They do not know if this fatigue is secondary to the anemia or the medication. Otherwise she is doing well. No fevers, dysuria, urinary frequency, infection, bloody stools, hemoptysis, unintentional weight loss, night sweats, increase in generalized pain.      Wt Readings from Last 3 Encounters:  04/27/23 98 lb 3.2 oz (44.5 kg)  04/12/23 98 lb 3.2 oz (44.5 kg)  01/29/23 107 lb 4 oz (48.6 kg)     Medications:   Current Outpatient Medications:    allopurinol (ZYLOPRIM) 100 MG tablet, Take 2 tablets (200 mg total) by mouth daily for 14 days., Disp: 28 tablet, Rfl: 0   amitriptyline (ELAVIL) 10 MG tablet, Take 20 mg by mouth at bedtime., Disp: , Rfl:    cyanocobalamin (,VITAMIN B-12,) 1000 MCG/ML injection, Inject into the muscle every 14  (fourteen) days., Disp: , Rfl:    cyanocobalamin (VITAMIN B12) 1000 MCG tablet, Take 1,000 mcg by mouth daily., Disp: , Rfl:    DULoxetine (CYMBALTA) 20 MG capsule, Take 20 mg by mouth daily., Disp: , Rfl:    ENTRESTO 24-26 MG, TAKE ONE TABLET BY MOUTH TWO TIMES DAILY, Disp: 60 tablet, Rfl: 3   feeding supplement, ENSURE ENLIVE, (ENSURE ENLIVE) LIQD, Take 237 mLs by mouth 2 (two) times daily between meals., Disp: 237 mL, Rfl: 12   furosemide (LASIX) 20 MG tablet, Take 1 tablet (20 mg total) by mouth daily. (Patient taking differently: Take 40 mg by mouth daily.), Disp: 30 tablet, Rfl: 11   HYDROcodone-acetaminophen (NORCO) 10-325 MG tablet, Take 1 tablet by mouth every 6 (six) hours as needed., Disp: , Rfl:    zanubrutinib (BRUKINSA) 80 MG capsule, Take 1 capsule (80 mg total) by mouth daily., Disp: 30 capsule, Rfl: 0   Multiple Vitamin (MULTIVITAMIN WITH MINERALS) TABS tablet, Take 1 tablet by mouth daily. (Patient not taking: Reported on 11/30/2022), Disp: 30 tablet, Rfl: 0  Allergies:  Allergies  Allergen Reactions   Demerol [Meperidine]     Past Medical History, Surgical history, Social history, and Family History were reviewed and updated.  Review of Systems: See above in HPI  Physical Exam:  weight is 98 lb 3.2 oz (44.5 kg). Her tympanic temperature is 97 F (36.1 C) (abnormal). Her blood pressure is  150/33 (abnormal) and her pulse is 70. Her oxygen saturation is 100%.   Physical Exam General: NAD. Elderly. Pale  Cardiovascular: regular rate and rhythm, no peripheral edema Pulmonary: clear ant fields Abdomen: soft, nontender, + bowel sounds GU: no suprapubic tenderness Extremities: no edema, no joint deformities Skin: no rashes Neurological: Weakness but otherwise nonfocal   Lab Results  Component Value Date   WBC 126.1 (HH) 04/26/2023   HGB 7.3 (L) 04/26/2023   HCT 24.9 (L) 04/26/2023   MCV 106.9 (H) 04/26/2023   PLT 164 04/26/2023     Chemistry      Component  Value Date/Time   NA 134 (L) 04/26/2023 1139   NA 138 03/06/2014 2049   K 4.7 04/26/2023 1139   K 3.6 03/06/2014 2049   CL 103 04/26/2023 1139   CL 107 03/06/2014 2049   CO2 19 (L) 04/26/2023 1139   CO2 29 03/06/2014 2049   BUN 83 (H) 04/26/2023 1139   BUN 14 03/06/2014 2049   CREATININE 2.29 (H) 04/26/2023 1139   CREATININE 0.75 03/06/2014 2049      Component Value Date/Time   CALCIUM 8.3 (L) 04/26/2023 1139   CALCIUM 8.9 03/06/2014 2049   ALKPHOS 86 04/26/2023 1139   AST 15 04/26/2023 1139   ALT 10 04/26/2023 1139   BILITOT 0.5 04/26/2023 1139      Assessment and Plan- Patient is a 89 y.o. female    Encounter Diagnoses  Name Primary?   CLL (chronic lymphocytic leukemia) (HCC) Yes   Elevated uric acid in blood    CLL: [56% OF NUCLEI POSITIVE FOR A 13Q DELETION]; April 2022-PET scan shows significant lymphadenopathy above and below diaphragm; splenomegaly. Discontinued Ibrutinib [hypertensive emergencies] acalabrutinib [leg cramps even with minimum dose]. Declined rituxan due to side effect profile. Started on Zanubritinib about 2 weeks ago. She is tolerating this well so far.   Symptomatic Anemia: Acute on chronic secondary to her progressive CLL. Hgb 7.3 1 day ago. She is here for 1 unit PRBC today. Expected to improve with treatment of her CLL. We will have her follow up next week for labs.   Elevated Uric Acid Level: New. Mild but given her progressive CLL and recent initiation of Zanubritinib there is a mild concern for tumor lysis syndrome. Discussed with Dr. Donneta Romberg. We elected to start her on allopurinol which I have dose adjusted for her last creatinine clearance level. Fluid administration held today given her history of CHF and concurrent blood administration/anemia. Close monitoring with follow up labs on Monday followed by in office visit Tuesday.   Leukocytosis: Worsening but likely secondary to her Zanubritinib. No signs of infection. Will monitor.   Elevated  Creatinine: Acute on chronic. See above regarding TLS concern and management. RTC next week as stated below.   Disposition: 1 unit PRBC today Allopurinol 100 mg once daily  RTC Monday APP follow up with labs (CBC w/, CMP, uric acid,B12, folate) to assess creatinine and uric acid to rule out tumor lysis syndrome    Clent Jacks PA-C 5/10/20243:29 PM

## 2023-04-29 ENCOUNTER — Telehealth: Payer: Self-pay | Admitting: Oncology

## 2023-04-29 MED ORDER — AMLODIPINE BESYLATE 5 MG PO TABS: 5.0000 mg | ORAL_TABLET | Freq: Every day | ORAL | 0 refills | Status: DC

## 2023-04-29 NOTE — Telephone Encounter (Signed)
Daughter Britta Mccreedy called to report that her mother has stomach discomfort also ear ringing.  Home BP 175/54. She used to be on Amlodipine 5mg  daily which was discontinued 2-3 months ago.  She recalls that patient had high BP in the past with other TKIs.  Currently on Zanubrutinib.  Recommend patient to try Amlodipine 5mg  daily if her SBP is persistently above 150.  I sent her small supply of medication to [facility] as requested by daughter.  Regarding to stomach discomfort, I recommend OTC omeprazole 20mg  PRN.  Please follow up on Monday. Thanks  zy

## 2023-04-30 ENCOUNTER — Ambulatory Visit: Payer: Medicare Other | Admitting: Podiatry

## 2023-04-30 ENCOUNTER — Inpatient Hospital Stay (HOSPITAL_BASED_OUTPATIENT_CLINIC_OR_DEPARTMENT_OTHER): Payer: Medicare Other | Admitting: Hospice and Palliative Medicine

## 2023-04-30 ENCOUNTER — Inpatient Hospital Stay: Payer: Medicare Other

## 2023-04-30 ENCOUNTER — Encounter: Payer: Self-pay | Admitting: Hospice and Palliative Medicine

## 2023-04-30 ENCOUNTER — Other Ambulatory Visit: Payer: Self-pay | Admitting: *Deleted

## 2023-04-30 VITALS — BP 148/41 | HR 63 | Temp 97.5°F | Resp 18 | Ht 62.0 in | Wt 100.3 lb

## 2023-04-30 DIAGNOSIS — M79676 Pain in unspecified toe(s): Secondary | ICD-10-CM | POA: Diagnosis not present

## 2023-04-30 DIAGNOSIS — B351 Tinea unguium: Secondary | ICD-10-CM

## 2023-04-30 DIAGNOSIS — C9110 Chronic lymphocytic leukemia of B-cell type not having achieved remission: Secondary | ICD-10-CM | POA: Diagnosis not present

## 2023-04-30 DIAGNOSIS — E790 Hyperuricemia without signs of inflammatory arthritis and tophaceous disease: Secondary | ICD-10-CM

## 2023-04-30 DIAGNOSIS — L600 Ingrowing nail: Secondary | ICD-10-CM

## 2023-04-30 DIAGNOSIS — D519 Vitamin B12 deficiency anemia, unspecified: Secondary | ICD-10-CM

## 2023-04-30 LAB — CBC WITH DIFFERENTIAL/PLATELET
Abs Immature Granulocytes: 0.17 10*3/uL — ABNORMAL HIGH (ref 0.00–0.07)
Basophils Absolute: 0 10*3/uL (ref 0.0–0.1)
Basophils Relative: 0 %
Eosinophils Absolute: 1.9 10*3/uL — ABNORMAL HIGH (ref 0.0–0.5)
Eosinophils Relative: 2 %
HCT: 28 % — ABNORMAL LOW (ref 36.0–46.0)
Hemoglobin: 8.3 g/dL — ABNORMAL LOW (ref 12.0–15.0)
Immature Granulocytes: 0 %
Lymphocytes Relative: 92 %
Lymphs Abs: 73.8 10*3/uL — ABNORMAL HIGH (ref 0.7–4.0)
MCH: 30.9 pg (ref 26.0–34.0)
MCHC: 29.6 g/dL — ABNORMAL LOW (ref 30.0–36.0)
MCV: 104.1 fL — ABNORMAL HIGH (ref 80.0–100.0)
Monocytes Absolute: 0.6 10*3/uL (ref 0.1–1.0)
Monocytes Relative: 1 %
Neutro Abs: 4.3 10*3/uL (ref 1.7–7.7)
Neutrophils Relative %: 5 %
Platelets: 226 10*3/uL (ref 150–400)
RBC: 2.69 MIL/uL — ABNORMAL LOW (ref 3.87–5.11)
RDW: 14.7 % (ref 11.5–15.5)
Smear Review: NORMAL
WBC: 80.8 10*3/uL (ref 4.0–10.5)
nRBC: 0 % (ref 0.0–0.2)

## 2023-04-30 LAB — TYPE AND SCREEN
DAT, complement: POSITIVE
Donor AG Type: NEGATIVE
Unit division: 0

## 2023-04-30 LAB — COMPREHENSIVE METABOLIC PANEL
ALT: 11 U/L (ref 0–44)
AST: 15 U/L (ref 15–41)
Albumin: 3.7 g/dL (ref 3.5–5.0)
Alkaline Phosphatase: 89 U/L (ref 38–126)
Anion gap: 9 (ref 5–15)
BUN: 68 mg/dL — ABNORMAL HIGH (ref 8–23)
CO2: 22 mmol/L (ref 22–32)
Calcium: 8.6 mg/dL — ABNORMAL LOW (ref 8.9–10.3)
Chloride: 107 mmol/L (ref 98–111)
Creatinine, Ser: 1.72 mg/dL — ABNORMAL HIGH (ref 0.44–1.00)
GFR, Estimated: 29 mL/min — ABNORMAL LOW (ref >60)
Glucose, Bld: 101 mg/dL — ABNORMAL HIGH (ref 70–99)
Potassium: 5.6 mmol/L — ABNORMAL HIGH (ref 3.5–5.1)
Sodium: 138 mmol/L (ref 135–145)
Total Bilirubin: 0.4 mg/dL (ref 0.3–1.2)
Total Protein: 6.4 g/dL — ABNORMAL LOW (ref 6.5–8.1)

## 2023-04-30 LAB — BPAM RBC
Blood Product Expiration Date: 202406062359
ISSUE DATE / TIME: 202405101232
Unit Type and Rh: 5100

## 2023-04-30 LAB — VITAMIN B12: Vitamin B-12: 1164 pg/mL — ABNORMAL HIGH (ref 180–914)

## 2023-04-30 LAB — LACTATE DEHYDROGENASE: LDH: 115 U/L (ref 98–192)

## 2023-04-30 LAB — FOLATE: Folate: 17.2 ng/mL (ref >5.9)

## 2023-04-30 LAB — URIC ACID: Uric Acid, Serum: 10.5 mg/dL — ABNORMAL HIGH (ref 2.5–7.1)

## 2023-04-30 LAB — PHOSPHORUS: Phosphorus: 4.1 mg/dL (ref 2.5–4.6)

## 2023-04-30 LAB — SAMPLE TO BLOOD BANK

## 2023-04-30 MED ORDER — LOKELMA 5 G PO PACK: 5.0000 g | PACK | Freq: Every day | ORAL | 0 refills | Status: DC

## 2023-04-30 NOTE — Progress Notes (Unsigned)
  Subjective:  Patient ID: Lindsay [name], female    DOB: 07-10-1934,  MRN: [id number]  Lindsay [name] presents to clinic today for {jgcomplaint:23593}  Chief Complaint  Patient presents with   Nail Problem    RFC w/ right great toe which is sore since 3-4 weeks,Referring Provider Jerl Mina, MD,LOV_04/24      New problem(s): None. {jgcomplaint:23593}  PCP is Jerl Mina, MD.  Allergies  Allergen Reactions   Demerol [Meperidine]     Review of Systems: Negative except as noted in the HPI.  Objective: No changes noted in today's physical examination. There were no vitals filed for this visit. Lindsay [name] is a pleasant 89 y.o. female {jgbodyhabitus:24098} AAO x 3.     Assessment/Plan: No diagnosis found.  No orders of the defined types were placed in this encounter.   None {Jgplan:23602::"-Patient/POA to call should there be question/concern in the interim."}   Return in about 3 months (around 07/31/2023).  Freddie Breech, DPM

## 2023-04-30 NOTE — Patient Instructions (Signed)
EPSOM SALT FOOT SOAK INSTRUCTIONS  Shopping List:  A. Plain epsom salt (not scented) B. Neosporin Cream/Ointment or    Place 1/4 cup of epsom salts in 2 quarts of warm tap water. IF YOU ARE DIABETIC, OR HAVE NEUROPATHY, CHECK THE TEMPERATURE OF THE WATER WITH YOUR ELBOW.   Submerge your foot/feet in the solution and soak for 10-15 minutes.      3.  Next, remove your foot/feet from solution, blot dry the affected area.    4.  Apply light amount of antibiotic cream/ointment and cover with fabric band-aid .  5.  This soak should be done once a day for 1-3 days.   6.  Monitor for any signs/symptoms of infection such as redness, swelling, odor, drainage, increased pain, or non-healing of digit.   7.  Please do not hesitate to call the office and speak to a Nurse or Doctor if you have questions.   8.  If you experience fever, chills, nightsweats, nausea or vomiting with worsening of digit/foot, please go to the emergency room.

## 2023-04-30 NOTE — Progress Notes (Signed)
1134- Call report rcvd from [facility] - Critical value wbc - 80.8. read back process performed with lab tech.  1149- RN gave critical value to [place], NP. Read back process performed with NP.

## 2023-04-30 NOTE — Progress Notes (Signed)
Symptom Management Clinic [facility] at [facility] Telephone:(336) [phone] [phone]  Patient Care Team: Jerl Mina, MD as PCP - General (Family Medicine)   Name of the patient: Lindsay [name]  [id number]  01-03-1934   Date of visit: 04/30/23  Reason for Consult: Lindsay [name] is a 89 y.o. female with multiple medical problems including HTN, CLL diagnosed in early 2022, intolerant to several chemotherapeutic agents.  Interim history: Patient saw Dr. Donneta Romberg on 04/12/2023.  She is felt to have disease progression with worsening leukocytosis and was started on Zanubrutinib.   Patient was seen by Clent Jacks, PA-C on 04/27/2023 for evaluation of fatigue.  Patient has symptomatic anemia and received transfusion of 1 unit PRBC.  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  Patient started allopurinol.  Daughter called on-call over the weekend to report hypertension with systolic blood pressure 140-170.  Patient was given amlodipine prescription but has not started taking that.  Patient presents to [facility] today for follow-up.  Today, patient reports that she is feeling much improved.  She denies any symptomatic complaints or concerns today.  Denies any neurologic complaints.  Denies any easy bleeding or bruising. Reports good appetite and denies weight loss. Denies chest pain. Denies any nausea, vomiting, constipation, or diarrhea. Denies urinary complaints. Patient offers no further specific complaints today.  PAST MEDICAL HISTORY: Past Medical History:  Diagnosis Date   Arthritis    RHEUMATOID   CHF (congestive heart failure) (HCC)    CLL (chronic lymphocytic leukemia) (HCC)    Edema    MILD ANKLE OCCAS   Fibromyalgia    GERD (gastroesophageal reflux disease)    Hypertension    Kidney stones    RLS (restless legs syndrome)     PAST SURGICAL HISTORY:  Past Surgical History:   Procedure Laterality Date   ABDOMINAL HYSTERECTOMY     CATARACT EXTRACTION W/PHACO Left 02/07/2016   Procedure: CATARACT EXTRACTION PHACO AND INTRAOCULAR LENS PLACEMENT (IOC);  Surgeon: Sallee Lange, MD;  Location: [facility] ORS;  Service: Ophthalmology;  Laterality: Left;  [place] 01:33 AP% 24.7 CDE 39.57 fluid pack lot # [id number]   EXTRACORPOREAL SHOCK WAVE LITHOTRIPSY Right 07/03/2019   Procedure: EXTRACORPOREAL SHOCK WAVE LITHOTRIPSY (ESWL);  Surgeon: Orson Ape, MD;  Location: [facility] ORS;  Service: Urology;  Laterality: Right;   EYE SURGERY     HIP ARTHROPLASTY Left 04/20/2020   Procedure: ARTHROPLASTY BIPOLAR HIP (HEMIARTHROPLASTY);  Surgeon: Kennedy Bucker, MD;  Location: [facility] ORS;  Service: Orthopedics;  Laterality: Left;   LITHOTRIPSY     TONSILLECTOMY      HEMATOLOGY/ONCOLOGY HISTORY:  Oncology History Overview Note  # LYMPHOCYTOSIS [25,K]/ANEMIA 9-10]; platelets-N; IgVH Somatic Hypermutation was not detected.56% OF NUCLEI POSITIVE FOR A 13Q DELETION; The phenotype is most consistent with a diagnosis of 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 Low level hypermetabolism consistent with CLL.2. Splenomegaly but no focal splenic lesions.  # April 2022- 13 Q DEL; IGVH- UNMUTATED  # MID April 2022- IBRUTINIB 280 mg x3 days [STOPPED sec ER/hypertensive emergency- 191/129]  # MAY 26th, 2022- start acalabrutinib 100 mg once a day [reduced dose]; intermittent; discontinued  September mid 2022. [Muscle cramps.]  # MAY 1st week- 2024- Zanubrutinib 80 mg        CLL (chronic lymphocytic leukemia) (HCC)  02/14/2021 Initial Diagnosis   CLL (chronic lymphocytic leukemia) (  HCC)   04/07/2021 Cancer Staging   Staging form: Chronic Lymphocytic Leukemia / Small Lymphocytic Lymphoma, AJCC 8th Edition - Clinical: Modified Rai Stage III (Modified Rai risk: High,  Binet: Stage C) - Signed by Earna Coder, MD on 04/07/2021 Stage prefix: Initial diagnosis   12/16/2021 -  Chemotherapy   Patient is on Treatment Plan : CLL - Rituximab q 4 weeks       ALLERGIES:  is allergic to demerol [meperidine].  MEDICATIONS:  Current Outpatient Medications  Medication Sig Dispense Refill   allopurinol (ZYLOPRIM) 100 MG tablet Take 2 tablets (200 mg total) by mouth daily for 14 days. 28 tablet 0   amitriptyline (ELAVIL) 10 MG tablet Take 20 mg by mouth at bedtime.     amLODipine (NORVASC) 5 MG tablet Take 1 tablet (5 mg total) by mouth daily. 2 tablet 0   cyanocobalamin (,VITAMIN B-12,) 1000 MCG/ML injection Inject into the muscle every 14 (fourteen) days.     cyanocobalamin (VITAMIN B12) 1000 MCG tablet Take 1,000 mcg by mouth daily.     DULoxetine (CYMBALTA) 20 MG capsule Take 20 mg by mouth daily.     ENTRESTO 24-26 MG TAKE ONE TABLET BY MOUTH TWO TIMES DAILY 60 tablet 3   feeding supplement, ENSURE ENLIVE, (ENSURE ENLIVE) LIQD Take 237 mLs by mouth 2 (two) times daily between meals. 237 mL 12   furosemide (LASIX) 20 MG tablet Take 1 tablet (20 mg total) by mouth daily. (Patient taking differently: Take 40 mg by mouth daily.) 30 tablet 11   HYDROcodone-acetaminophen (NORCO) 10-325 MG tablet Take 1 tablet by mouth every 6 (six) hours as needed.     Multiple Vitamin (MULTIVITAMIN WITH MINERALS) TABS tablet Take 1 tablet by mouth daily. (Patient not taking: Reported on 11/30/2022) 30 tablet 0   omeprazole (PRILOSEC) 20 MG capsule Take 20 mg by mouth daily. (Patient not taking: Reported on 04/30/2023)     zanubrutinib (BRUKINSA) 80 MG capsule Take 1 capsule (80 mg total) by mouth daily. 30 capsule 0   No current facility-administered medications for this visit.    VITAL SIGNS: Ht 5\' 2"  (1.575 m)   Wt 100 lb 4.8 oz (45.5 kg)   BMI 18.35 kg/m  Filed Weights   04/30/23 1131  Weight: 100 lb 4.8 oz (45.5 kg)    Estimated body mass index is 18.35 kg/m as  calculated from the following:   Height as of this encounter: 5\' 2"  (1.575 m).   Weight as of this encounter: 100 lb 4.8 oz (45.5 kg).  LABS: CBC:    Component Value Date/Time   WBC 80.8 (HH) 04/30/2023 1107   HGB 8.3 (L) 04/30/2023 1107   HGB 7.3 (L) 04/26/2023 1139   HGB 14.1 03/06/2014 2049   HCT 28.0 (L) 04/30/2023 1107   HCT 42.9 03/06/2014 2049   PLT 226 04/30/2023 1107   PLT 164 04/26/2023 1139   PLT 240 03/06/2014 2049   MCV 104.1 (H) 04/30/2023 1107   MCV 88 03/06/2014 2049   NEUTROABS PENDING 04/30/2023 1107   NEUTROABS 7.3 (H) 03/06/2014 2049   LYMPHSABS PENDING 04/30/2023 1107   LYMPHSABS 6.6 (H) 03/06/2014 2049   MONOABS PENDING 04/30/2023 1107   MONOABS 1.3 (H) 03/06/2014 2049   EOSABS PENDING 04/30/2023 1107   EOSABS 0.4 03/06/2014 2049   BASOSABS PENDING 04/30/2023 1107   BASOSABS 0.1 03/06/2014 2049   Comprehensive Metabolic Panel:    Component Value Date/Time   NA 138 04/30/2023 1107   NA 138  03/06/2014 2049   K 5.6 (H) 04/30/2023 1107   K 3.6 03/06/2014 2049   CL 107 04/30/2023 1107   CL 107 03/06/2014 2049   CO2 22 04/30/2023 1107   CO2 29 03/06/2014 2049   BUN 68 (H) 04/30/2023 1107   BUN 14 03/06/2014 2049   CREATININE 1.72 (H) 04/30/2023 1107   CREATININE 2.29 (H) 04/26/2023 1139   CREATININE 0.75 03/06/2014 2049   GLUCOSE 101 (H) 04/30/2023 1107   GLUCOSE 103 (H) 03/06/2014 2049   CALCIUM 8.6 (L) 04/30/2023 1107   CALCIUM 8.9 03/06/2014 2049   AST 15 04/30/2023 1107   AST 15 04/26/2023 1139   ALT 11 04/30/2023 1107   ALT 10 04/26/2023 1139   ALKPHOS 89 04/30/2023 1107   BILITOT 0.4 04/30/2023 1107   BILITOT 0.5 04/26/2023 1139   PROT 6.4 (L) 04/30/2023 1107   ALBUMIN 3.7 04/30/2023 1107    RADIOGRAPHIC STUDIES: No results found.  PERFORMANCE STATUS (ECOG) : 2 - Symptomatic, <50% confined to bed  Review of Systems Unless otherwise noted, a complete review of systems is negative.  Physical Exam General: NAD Cardiovascular:  regular rate and rhythm Pulmonary: clear ant fields Abdomen: soft, nontender, + bowel sounds GU: no suprapubic tenderness Extremities: trace BLE edema, no joint deformities Skin: no rashes Neurological: Weakness but otherwise nonfocal  Assessment and Plan:   CLL -patient on Brukinsa with downtrending leukocytosis.  Hypertension -likely secondary to Brukinsa.  Daughter has not started giving patient amlodipine as prescribed over the weekend.  Blood pressure somewhat improved today.  Daughter stated that she is concerned about giving patient antihypertensives.  Recommended that family speak with cardiology for guidance.  Elevated uric acid -downtrending on allopurinol  Hyperkalemia -I suspect this is also secondary to Brukinsa.  Discussed with Dr. Donneta Romberg who advised starting patient on a potassium binder.  Will trial Lokelma as to prevent diarrhea effects from Kayexalate.    Patient to return in clinic later this week for close follow-up on labs.  Patient expressed understanding and was in agreement with this plan. She also understands that She can call clinic at any time with any questions, concerns, or complaints.   Thank you for allowing me to participate in the care of this very pleasant patient.   Time Total: 25 minutes  Visit consisted of counseling and education dealing with the complex and emotionally intense issues of symptom management in the setting of serious illness.Greater than 50%  of this time was spent counseling and coordinating care related to the above assessment and plan.  Signed by: Laurette Schimke, PhD, NP-C

## 2023-05-01 ENCOUNTER — Other Ambulatory Visit: Payer: Self-pay | Admitting: *Deleted

## 2023-05-01 ENCOUNTER — Other Ambulatory Visit: Payer: Self-pay

## 2023-05-01 ENCOUNTER — Inpatient Hospital Stay: Payer: Medicare Other | Admitting: Hospice and Palliative Medicine

## 2023-05-01 ENCOUNTER — Ambulatory Visit: Payer: Medicare Other | Attending: Family | Admitting: Family

## 2023-05-01 ENCOUNTER — Encounter: Payer: Self-pay | Admitting: Family

## 2023-05-01 VITALS — BP 176/43 | HR 62 | Wt 99.8 lb

## 2023-05-01 DIAGNOSIS — D649 Anemia, unspecified: Secondary | ICD-10-CM | POA: Insufficient documentation

## 2023-05-01 DIAGNOSIS — N189 Chronic kidney disease, unspecified: Secondary | ICD-10-CM | POA: Insufficient documentation

## 2023-05-01 DIAGNOSIS — I428 Other cardiomyopathies: Secondary | ICD-10-CM | POA: Diagnosis not present

## 2023-05-01 DIAGNOSIS — Z87440 Personal history of urinary (tract) infections: Secondary | ICD-10-CM | POA: Insufficient documentation

## 2023-05-01 DIAGNOSIS — M797 Fibromyalgia: Secondary | ICD-10-CM | POA: Diagnosis not present

## 2023-05-01 DIAGNOSIS — I509 Heart failure, unspecified: Secondary | ICD-10-CM | POA: Diagnosis not present

## 2023-05-01 DIAGNOSIS — I130 Hypertensive heart and chronic kidney disease with heart failure and stage 1 through stage 4 chronic kidney disease, or unspecified chronic kidney disease: Secondary | ICD-10-CM | POA: Insufficient documentation

## 2023-05-01 DIAGNOSIS — I10 Essential (primary) hypertension: Secondary | ICD-10-CM

## 2023-05-01 DIAGNOSIS — K219 Gastro-esophageal reflux disease without esophagitis: Secondary | ICD-10-CM | POA: Insufficient documentation

## 2023-05-01 DIAGNOSIS — C9110 Chronic lymphocytic leukemia of B-cell type not having achieved remission: Secondary | ICD-10-CM | POA: Insufficient documentation

## 2023-05-01 DIAGNOSIS — R002 Palpitations: Secondary | ICD-10-CM | POA: Insufficient documentation

## 2023-05-01 DIAGNOSIS — I5022 Chronic systolic (congestive) heart failure: Secondary | ICD-10-CM | POA: Diagnosis not present

## 2023-05-01 DIAGNOSIS — G2581 Restless legs syndrome: Secondary | ICD-10-CM | POA: Diagnosis not present

## 2023-05-01 DIAGNOSIS — R0602 Shortness of breath: Secondary | ICD-10-CM | POA: Insufficient documentation

## 2023-05-01 DIAGNOSIS — D519 Vitamin B12 deficiency anemia, unspecified: Secondary | ICD-10-CM | POA: Diagnosis not present

## 2023-05-01 NOTE — Progress Notes (Signed)
PCP: Jerl Mina, MD (last seen 04/24) Primary Cardiologist: None  HPI:  Ms Furmanski is a 89 y/o female with a history of HTN, CKD, CLL, fibromyalgia, GERD, RLS and heart failure.   Echo 05/30/22: EF of 45-50% along with mild/moderate MR  Has not been admitted or been in the ED in the last 6 months.   She presents today for a HF follow-up visit with a chief complaint of intermittemt mild SOB which is chronic in nature. Has associated occasional palpitations along with this. Previous shingle pain has resolved.   Has been started back on zanubritinib due to progressive CLL. Also has been started on allopurinol due to elevated uric acid level concerning for possible tumor lysis syndrome. Due to subsequent elevated home BP, she was seen in the symptomatic clinic at the cancer center. HTN thought to be due to zanubritinib. Found to be hyperkalemic and will be starting lokelma with repeat labs later this week.   ROS: All systems negative except as listed in HPI, PMH and Problem List.  SH:  Social History   Socioeconomic History   Marital status: Widowed    Spouse name: Not on file   Number of children: Not on file   Years of education: Not on file   Highest education level: Not on file  Occupational History   Not on file  Tobacco Use   Smoking status: Never   Smokeless tobacco: Never  Substance and Sexual Activity   Alcohol use: No   Drug use: Never   Sexual activity: Not on file  Other Topics Concern   Not on file  Social History Narrative   Lives with daughter, in [place]. 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    Social Determinants of Health   Financial Resource Strain: Not on file  Food Insecurity: Not on file  Transportation Needs: Not on file  Physical Activity: Not on file  Stress: Not on file  Social Connections: Not on file  Intimate Partner Violence: Not on file    FH:  Family History  Problem Relation Age of Onset   Cancer Mother         uterine cancer    Past Medical History:  Diagnosis Date   Arthritis    RHEUMATOID   CHF (congestive heart failure) (HCC)    CLL (chronic lymphocytic leukemia) (HCC)    Edema    MILD ANKLE OCCAS   Fibromyalgia    GERD (gastroesophageal reflux disease)    Hypertension    Kidney stones    RLS (restless legs syndrome)     Current Outpatient Medications  Medication Sig Dispense Refill   allopurinol (ZYLOPRIM) 100 MG tablet Take 2 tablets (200 mg total) by mouth daily for 14 days. 28 tablet 0   amitriptyline (ELAVIL) 10 MG tablet Take 20 mg by mouth at bedtime.     amLODipine (NORVASC) 5 MG tablet Take 1 tablet (5 mg total) by mouth daily. 2 tablet 0   cyanocobalamin (,VITAMIN B-12,) 1000 MCG/ML injection Inject into the muscle every 14 (fourteen) days.     cyanocobalamin (VITAMIN B12) 1000 MCG tablet Take 1,000 mcg by mouth daily.     DULoxetine (CYMBALTA) 20 MG capsule Take 20 mg by mouth daily.     ENTRESTO 24-26 MG TAKE ONE TABLET BY MOUTH TWO TIMES DAILY 60 tablet 3   feeding supplement, ENSURE ENLIVE, (ENSURE ENLIVE) LIQD Take 237 mLs by mouth 2 (two) times daily between meals. 237 mL 12   furosemide (LASIX)  20 MG tablet Take 1 tablet (20 mg total) by mouth daily. (Patient taking differently: Take 40 mg by mouth daily.) 30 tablet 11   HYDROcodone-acetaminophen (NORCO) 10-325 MG tablet Take 1 tablet by mouth every 6 (six) hours as needed.     Multiple Vitamin (MULTIVITAMIN WITH MINERALS) TABS tablet Take 1 tablet by mouth daily. (Patient not taking: Reported on 11/30/2022) 30 tablet 0   omeprazole (PRILOSEC) 20 MG capsule Take 20 mg by mouth daily. (Patient not taking: Reported on 04/30/2023)     sodium zirconium cyclosilicate (LOKELMA) 5 g packet Take 5 g by mouth daily. 11 each 0   zanubrutinib (BRUKINSA) 80 MG capsule Take 1 capsule (80 mg total) by mouth daily. 30 capsule 0   No current facility-administered medications for this visit.   Vitals:   05/01/23 1134  BP: (!)  176/43  Pulse: 62  SpO2: 100%  Weight: 99 lb 12.8 oz (45.3 kg)   Wt Readings from Last 3 Encounters:  05/01/23 99 lb 12.8 oz (45.3 kg)  04/30/23 100 lb 4.8 oz (45.5 kg)  04/27/23 98 lb 3.2 oz (44.5 kg)   Lab Results  Component Value Date   CREATININE 1.72 (H) 04/30/2023   CREATININE 2.29 (H) 04/26/2023   CREATININE 1.60 (H) 04/12/2023   PHYSICAL EXAM:  General:  Well appearing. No resp difficulty HEENT: normal Neck: supple. JVP flat. No lymphadenopathy or thryomegaly appreciated. Cor: PMI normal. Regular rate & rhythm. No rubs, gallops or murmurs. Lungs: clear Abdomen: soft, nontender, nondistended. No hepatosplenomegaly. No bruits or masses. Good bowel sounds. Extremities: no cyanosis, clubbing, rash, edema Neuro: alert & orientedx3, cranial nerves grossly intact. Moves all 4 extremities w/o difficulty. Affect pleasant.  ECG: not done  ASSESSMENT & PLAN:  1: NICM with mildly reduced ejection fraction- - NYHA class II - euvolemic - etiology likely HTN/ CLL - weighing daily; reminded to call for an overnight weight gain of > 2 pounds or a weekly weight gain of > 5 pounds - weight down 8 pounds from last visit here 3 months ago - Echo 05/30/22: EF of 45-50% along with mild/moderate MR; plan to update echo at her next visit - watching her sodium intake closely - continue entresto 24/26mg  BID; unable to titrate today due to elevated potassium - history of recurrent UTI so most likely will not be a candidate for SGLT2 - BNP 07/16/22 was 551.3 - PharmD reconciled meds w/ patient  2: HTN- - BP 176/43 - elevated BP thought to be due to zanubritinib; has not started amlodipine yet - checking BP at home and explained to check it once daily but vary the times of day she checks it - saw PCP Burnett Sheng) 04/24 - BMP done 04/30/23 reviewed and showed sodium 138, potassium 5.6, creatinine 1.72 and GFR 29 - will be starting lokelma with repeat labs later this week  3: Anemia- - taking  oral B12 daily and injection every 2 weeks - hemoglobin 04/30/23 was 8.3 - vitamin B12 level 04/30/23 was 1164  4: CLL- - went to cancer center symptom clinic and saw Laurette Schimke, NP 04/30/23 - saw oncology Basilia Jumbo) 04/27/23  Return in 4 months, sooner if needed

## 2023-05-01 NOTE — Progress Notes (Signed)
[facility] HEART FAILURE CLINIC - Pharmacist Note  Lindsay [name] is a 89 y.o. 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 She is here today with her daughter. Patient and daughter report no issues with medication access and no adverse effects from current regimen. They also report no signs or symptoms of volume overload. Daughter reports that the patient was seen yesterday at [facility]. Labs yesterday revealed hyperkalemia (K 5.6) for which she was prescribed Lokelma. They report that she has not yet started the [facility], but they are planning to pick it up today after this visit. They have follow-up with [facility] scheduled Thursday.   Patient's daughter reports that she has resumed zanubrutinib for CLL. Daughter reports that the patient had lost some weight prior to resuming treatment for CLL. Since resuming zanubrutinib, she has gained weight and reports feeling better. Weight gain has been gradual over the course of weeks to months. They have noticed increasing BP since resuming zanubrutinib. Hypertension is a relatively common side effect of this medication (16-19%). Given current hyperkalemia, anti-hypertension regimen will not be adjusted at today's visit. Per discussion with patient, she would prefer not to add medications, but she is willing to add medications if needed. She also would prefer daily medications as opposed to more frequent medications.   Recent ED Visit (past 6 months): none  Guideline-Directed Medical Therapy/Evidence Based Medicine ACE/ARB/ARNI: Sacubitril/valsartan 24/26 mg BID Beta Blocker:  none Aldosterone Antagonist:  none Diuretic: Furosemide 40 mg daily SGLT2i:  none  Adherence Assessment Do you ever forget to take your medication? [] Yes [x] No  Do you ever skip doses due to side effects? [] Yes [x] No  Do you have trouble affording your medicines? [] Yes [x] No  Are you ever unable to pick up your medication  due to transportation difficulties? [] Yes [x] No  Do you ever stop taking your medications because you don't believe they are helping? [] Yes [x] No  Do you check your weight daily? [x] Yes [] No  Adherence strategy: written schedule  Barriers to obtaining medications: none reported  Diagnostics ECHO: Date 05/30/2022, EF 45-50%, GHK, G1DD  Vitals    04/30/2023   11:31 AM 04/27/2023    2:44 PM 04/27/2023    1:00 PM  Vitals with BMI  Height 5\' 2"     Weight 100 lbs 5 oz    BMI 18.34    Systolic 148 123 829  Diastolic 41 33 33  Pulse 63 70 69     Recent Labs    Latest Ref Rng & Units 04/30/2023   11:07 AM 04/26/2023   11:39 AM 04/12/2023   10:48 AM  BMP  Glucose 70 - 99 mg/dL 562  130  865   BUN 8 - 23 mg/dL 68  83  55   Creatinine 0.44 - 1.00 mg/dL 7.84  6.96  2.95   Sodium 135 - 145 mmol/L 138  134  137   Potassium 3.5 - 5.1 mmol/L 5.6  4.7  4.7   Chloride 98 - 111 mmol/L 107  103  106   CO2 22 - 32 mmol/L 22  19  26    Calcium 8.9 - 10.3 mg/dL 8.6  8.3  8.8     Past Medical History Past Medical History:  Diagnosis Date   Arthritis    RHEUMATOID   CHF (congestive heart failure) (HCC)    CLL (chronic lymphocytic leukemia) (HCC)    Edema    MILD ANKLE OCCAS   Fibromyalgia  GERD (gastroesophageal reflux disease)    Hypertension    Kidney stones    RLS (restless legs syndrome)    Plan Continue current regimen per NP Follow up with [facility] on Thursday for repeat labs Consider titration of Entresto as able per repeat labs   Time spent: 15 minutes  Celene Squibb, PharmD PGY1 Pharmacy Resident 05/01/2023 12:16 PM

## 2023-05-02 ENCOUNTER — Other Ambulatory Visit: Payer: Self-pay

## 2023-05-03 ENCOUNTER — Inpatient Hospital Stay (HOSPITAL_BASED_OUTPATIENT_CLINIC_OR_DEPARTMENT_OTHER): Payer: Medicare Other | Admitting: Hospice and Palliative Medicine

## 2023-05-03 ENCOUNTER — Other Ambulatory Visit (HOSPITAL_COMMUNITY): Payer: Self-pay

## 2023-05-03 ENCOUNTER — Encounter: Payer: Self-pay | Admitting: Internal Medicine

## 2023-05-03 ENCOUNTER — Inpatient Hospital Stay: Payer: Medicare Other

## 2023-05-03 ENCOUNTER — Other Ambulatory Visit: Payer: Self-pay

## 2023-05-03 ENCOUNTER — Encounter: Payer: Self-pay | Admitting: Hospice and Palliative Medicine

## 2023-05-03 ENCOUNTER — Encounter: Payer: Self-pay | Admitting: Family

## 2023-05-03 ENCOUNTER — Telehealth: Payer: Self-pay | Admitting: *Deleted

## 2023-05-03 ENCOUNTER — Other Ambulatory Visit: Payer: Self-pay | Admitting: Internal Medicine

## 2023-05-03 VITALS — BP 171/42 | HR 53 | Temp 98.1°F | Resp 18 | Ht 62.0 in | Wt 99.4 lb

## 2023-05-03 DIAGNOSIS — E790 Hyperuricemia without signs of inflammatory arthritis and tophaceous disease: Secondary | ICD-10-CM

## 2023-05-03 DIAGNOSIS — E860 Dehydration: Secondary | ICD-10-CM

## 2023-05-03 DIAGNOSIS — C9110 Chronic lymphocytic leukemia of B-cell type not having achieved remission: Secondary | ICD-10-CM

## 2023-05-03 DIAGNOSIS — D519 Vitamin B12 deficiency anemia, unspecified: Secondary | ICD-10-CM

## 2023-05-03 LAB — URIC ACID: Uric Acid, Serum: 7.5 mg/dL — ABNORMAL HIGH (ref 2.5–7.1)

## 2023-05-03 LAB — CBC WITH DIFFERENTIAL (CANCER CENTER ONLY)
Abs Immature Granulocytes: 0.21 10*3/uL — ABNORMAL HIGH (ref 0.00–0.07)
Basophils Absolute: 0.1 10*3/uL (ref 0.0–0.1)
Basophils Relative: 0 %
Eosinophils Absolute: 3.1 10*3/uL — ABNORMAL HIGH (ref 0.0–0.5)
Eosinophils Relative: 3 %
HCT: 29.9 % — ABNORMAL LOW (ref 36.0–46.0)
Hemoglobin: 9 g/dL — ABNORMAL LOW (ref 12.0–15.0)
Immature Granulocytes: 0 %
Lymphocytes Relative: 92 %
Lymphs Abs: 92.9 10*3/uL — ABNORMAL HIGH (ref 0.7–4.0)
MCH: 31.6 pg (ref 26.0–34.0)
MCHC: 30.1 g/dL (ref 30.0–36.0)
MCV: 104.9 fL — ABNORMAL HIGH (ref 80.0–100.0)
Monocytes Absolute: 0.6 10*3/uL (ref 0.1–1.0)
Monocytes Relative: 1 %
Neutro Abs: 3.7 10*3/uL (ref 1.7–7.7)
Neutrophils Relative %: 4 %
Platelet Count: 263 10*3/uL (ref 150–400)
RBC: 2.85 MIL/uL — ABNORMAL LOW (ref 3.87–5.11)
RDW: 14.5 % (ref 11.5–15.5)
Smear Review: NORMAL
WBC Count: 100.6 10*3/uL (ref 4.0–10.5)
nRBC: 0 % (ref 0.0–0.2)

## 2023-05-03 LAB — SAMPLE TO BLOOD BANK

## 2023-05-03 LAB — CMP (CANCER CENTER ONLY)
ALT: 12 U/L (ref 0–44)
AST: 20 U/L (ref 15–41)
Albumin: 3.8 g/dL (ref 3.5–5.0)
Alkaline Phosphatase: 94 U/L (ref 38–126)
Anion gap: 9 (ref 5–15)
BUN: 57 mg/dL — ABNORMAL HIGH (ref 8–23)
CO2: 22 mmol/L (ref 22–32)
Calcium: 9 mg/dL (ref 8.9–10.3)
Chloride: 110 mmol/L (ref 98–111)
Creatinine: 1.5 mg/dL — ABNORMAL HIGH (ref 0.44–1.00)
GFR, Estimated: 34 mL/min — ABNORMAL LOW (ref >60)
Glucose, Bld: 105 mg/dL — ABNORMAL HIGH (ref 70–99)
Potassium: 5.8 mmol/L — ABNORMAL HIGH (ref 3.5–5.1)
Sodium: 141 mmol/L (ref 135–145)
Total Bilirubin: 0.5 mg/dL (ref 0.3–1.2)
Total Protein: 6.3 g/dL — ABNORMAL LOW (ref 6.5–8.1)

## 2023-05-03 LAB — PHOSPHORUS: Phosphorus: 3.7 mg/dL (ref 2.5–4.6)

## 2023-05-03 MED ORDER — AMLODIPINE BESYLATE 5 MG PO TABS: 5.0000 mg | ORAL_TABLET | Freq: Every day | ORAL | 0 refills | Status: DC

## 2023-05-03 MED ORDER — LOKELMA 5 G PO PACK: 10.0000 g | PACK | Freq: Three times a day (TID) | ORAL | 0 refills | Status: DC

## 2023-05-03 MED ORDER — SODIUM CHLORIDE 0.9 % IV SOLN
INTRAVENOUS | Status: DC
  Filled 2023-05-03 (×2): qty 250

## 2023-05-03 MED ORDER — BRUKINSA 80 MG PO CAPS
80.0000 mg | ORAL_CAPSULE | Freq: Every day | ORAL | 0 refills | Status: DC
Start: 2023-05-03 — End: 2023-05-23
  Filled 2023-05-03: qty 30, 30d supply, fill #0

## 2023-05-03 NOTE — Telephone Encounter (Signed)
CBC with Differential (Cancer Center Only) Order: [id number] Status: Final result     Visible to patient: Yes (not seen)     Next appt: 05/04/2023 at 01:30 PM in Oncology ([facility])     Dx: CLL (chronic lymphocytic leukemia) (H...   0 Result Notes          Component Ref Range & Units 10:41 (05/03/23) 3 d ago (04/30/23) 7 d ago (04/26/23) 3 wk ago (04/12/23) 9 mo ago (07/19/22) 9 mo ago (07/18/22) 9 mo ago (07/18/22)  WBC Count 4.0 - 10.5 K/uL 100.6 High Panic  80.8 High Panic  CM 126.1 High Panic  CM 72.8 High Panic  CM 42.7 High   38.6 High   Comment: REPEATED TO VERIFY THIS CRITICAL RESULT HAS VERIFIED AND BEEN CALLED TO HEATHER HERMANN BY JESSICA PERRY ON 05 16 2024 AT 1125, AND HAS BEEN READ BACK.  RBC 3.87 - 5.11 MIL/uL 2.85 Low  2.69 Low  2.33 Low  2.49 Low  2.64 Low   2.08 Low   Hemoglobin 12.0 - 15.0 g/dL 9.0 Low  8.3 Low  7.3 Low  8.1 Low  8.7 Low  10.3 Low  CM 7.1 Low   HCT 36.0 - 46.0 % 29.9 Low  28.0 Low  24.9 Low  26.7 Low  27.2 Low  32.5 Low  CM 22.3 Low   MCV 80.0 - 100.0 fL 104.9 High  104.1 High  106.9 High  107.2 High  103.0 High   107.2 High   MCH 26.0 - 34.0 pg 31.6 30.9 31.3 32.5 33.0  34.1 High   MCHC 30.0 - 36.0 g/dL 95.2 84.1 Low  32.4 Low  30.3 32.0  31.8  RDW 11.5 - 15.5 % 14.5 14.7 14.0 14.1 17.5 High   16.0 High   Platelet Count 150 - 400 K/uL 263 226 164 150 179  175  nRBC 0.0 - 0.2 % 0.0 0.0 0.0 0.0 0.0 CM  0.1 CM  Neutrophils Relative % % 4 5 5 5      Neutro Abs 1.7 - 7.7 K/uL 3.7 4.3 5.6 3.7     Lymphocytes Relative % 92 92 93 92     Lymphs Abs 0.7 - 4.0 K/uL 92.9 High  73.8 High  118.5 High  66.9 High      Monocytes Relative % 1 1 1 2      Monocytes Absolute 0.1 - 1.0 K/uL 0.6 0.6 0.8 1.3 High      Eosinophils Relative % 3 2 1 1      Eosinophils Absolute 0.0 - 0.5 K/uL 3.1 High  1.9 High  0.9 High  0.8 High      Basophils Relative % 0 0 0 0     Basophils Absolute 0.0 - 0.1 K/uL 0.1 0.0 0.0 0.1     WBC Morphology DIFF CONSISTENT WITH  DX. CONFIRMED BY MANUAL DIFF DIFF. CONFIRMED BY SMEAR SMUDGE CELLS CM SMUDGE CELLS CM     Smear Review Normal platelet morphology Normal platelet morphology CM Normal platelet morphology CM Normal platelet morphology CM     Comment: PLATELETS APPEAR ADEQUATE  Immature Granulocytes % 0 0 0 0     Abs Immature Granulocytes 0.00 - 0.07 K/uL 0.21 High  0.17 High  CM 0.30 High  CM 0.12 High  CM     Smudge Cells PRESENT        Ovalocytes PRESENT        Comment: Performed at [facility], [address] [place],  [place] [address]  RBC Morphology  MORPHOLOGY UNREMARKABLE MORPHOLOGY UNREMARKABLE MORPHOLOGY UNREMARKABLE     Resulting Agency [facility]         Specimen Collected: 05/03/23 10:41 Last Resulted: 05/03/23 11:44      tains abnormal data CMP (Cancer Center only) Order: [id number] Status: Final result     Visible to patient: Yes (not seen)     Next appt: 05/04/2023 at 01:30 PM in Oncology ([facility])     Dx: CLL (chronic lymphocytic leukemia) (H...   0 Result Notes          Component Ref Range & Units 10:43 3 d ago 7 d ago 3 wk ago 6 mo ago 7 mo ago 9 mo ago  Sodium 135 - 145 mmol/L 141 138 134 Low  137 141 137 142  Potassium 3.5 - 5.1 mmol/L 5.8 High  5.6 High  4.7 4.7 4.8 4.2 3.9  Chloride 98 - 111 mmol/L 110 107 103 106 111 108 111  CO2 22 - 32 mmol/L 22 22 19  Low  26 22 22 24   Glucose, Bld 70 - 99 mg/dL 191 High  478 High  CM 134 High  CM 109 High  CM 107 High  CM 120 High  CM 110 High  CM  Comment: Glucose reference range applies only to samples taken after fasting for at least 8 hours.  BUN 8 - 23 mg/dL 57 High  68 High  83 High  55 High  47 High  56 High  25 High   Creatinine 0.44 - 1.00 mg/dL 2.95 High  6.21 High  3.08 High  1.60 High  1.30 High  1.71 High  1.05 High   Calcium 8.9 - 10.3 mg/dL 9.0 8.6 Low  8.3 Low  8.8 Low  8.8 Low  8.8 Low  8.7 Low   Total Protein 6.5 - 8.1 g/dL  6.3 Low  6.4 Low  6.6 6.2 Low      Albumin 3.5 - 5.0 g/dL 3.8 3.7 3.9 4.3     AST 15 - 41 U/L 20 15 15 18      ALT 0 - 44 U/L 12 11 10 11      Alkaline Phosphatase 38 - 126 U/L 94 89 86 80     Total Bilirubin 0.3 - 1.2 mg/dL 0.5 0.4 0.5 0.8     GFR, Estimated >60 mL/min 34 Low   20 Low  CM 31 Low  CM     Comment: (NOTE) Calculated using the CKD-EPI Creatinine Equation (2021)  Anion gap 5 - 15 9 9  CM 12 CM 5 CM 8 CM 7 CM 7 CM  Comment: Performed at [facility], [address] [place], [place] [address]  Resulting Agency [facility]         Specimen Collected: 05/03/23 10:43 Last Resulted: 05/03/23 11:10      Phosphorus Order: [id number] Status: Final result     Visible to patient: Yes (not seen)     Next appt: 05/04/2023 at 01:30 PM in Oncology ([facility])     Dx: CLL (chronic lymphocytic leukemia) (H...   0 Result Notes     Component Ref Range & Units 10:41 3 d ago  Phosphorus 2.5 - 4.6 mg/dL 3.7 4.1 CM  Comment: Performed  at [facility], [address] [place], [place] [address]  Resulting Agency [facility] CLIN LAB [facility] CLIN LAB         Specimen Collected: 05/03/23 10:41 Last Resulted: 05/03/23 13:11      Uric acid Order: [id number] Status: Final result     Visible to patient: Yes (not seen)     Next appt: 05/04/2023 at 01:30 PM in Oncology ([facility])     Dx: CLL (chronic lymphocytic leukemia) (H...   0 Result Notes      Component Ref Range & Units 10:41 3 d ago 7 d ago  Uric Acid, Serum 2.5 - 7.1 mg/dL 7.5 High  62.1 High  CM 14.6 High  CM  Comment: Performed at [facility], [address] [place], [place] [address]  Resulting Agency [facility] CLIN LAB [facility] CLIN LAB [facility] CLIN LAB         Specimen Collected: 05/03/23 10:41 Last Resulted: 05/03/23 13:15

## 2023-05-03 NOTE — Progress Notes (Signed)
1125 critical value reported by Shanda Bumps in cancer center lab- wbc 106. Read back process performed with lab tech  [id number] Reeve, NP notified verbally of critical value- read back process performed with provider.

## 2023-05-03 NOTE — Telephone Encounter (Signed)
Spoke with daughter. Updated daughter on [place]. She has enough of the Lokelma from the first prescription to start the new dose of 10 g three times a day. She is optimistic that the potassium will be much improved tomorrow. I explained that insurance may take up to 24-72 hours to approve this dosing. Most insurance may only cover 5 g at three times a day. Daughter gave verbal understanding. She thanked me for updating her on the PA process. Pt has apts in the cancer center tomorrow afternoon for recheck labs-potassium at that time.

## 2023-05-03 NOTE — Progress Notes (Signed)
Symptom Management Clinic [facility] at [facility] Telephone:([phone] Fax:([phone]) [phone]  Patient Care Team: Jerl Mina, MD as PCP - General (Family Medicine)   Name of the patient: Lindsay [name]  [id number]  1934-12-02   Date of visit: 05/03/23  Reason for Consult: Lindsay [name] is a 89 y.o. female with multiple medical problems including HTN, CLL diagnosed in early 2022, intolerant to several chemotherapeutic agents.  Interim history: Patient saw Dr. Donneta Romberg on 04/12/2023.  She is felt to have disease progression with worsening leukocytosis and was started on Zanubrutinib.   Patient was seen by Clent Jacks, PA-C on 04/27/2023 for evaluation of fatigue.  Patient has symptomatic anemia and received transfusion of 1 unit PRBC.  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  Patient started allopurinol.  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  Was started on Lokelma but daughter states the patient really started first dose yesterday.  Today, patient returns for follow-up labs.  Patient denies any changes or concerns today.  No new symptomatic complaints.  Denies any neurologic complaints.  Denies any easy bleeding or bruising. Reports good appetite and denies weight loss. Denies chest pain. Denies any nausea, vomiting, constipation, or diarrhea. Denies urinary complaints. Patient offers no further specific complaints today.  PAST MEDICAL HISTORY: Past Medical History:  Diagnosis Date   Arthritis    RHEUMATOID   CHF (congestive heart failure) (HCC)    CLL (chronic lymphocytic leukemia) (HCC)    Edema    MILD ANKLE OCCAS   Fibromyalgia    GERD (gastroesophageal reflux disease)    Hypertension    Kidney stones    RLS (restless legs syndrome)     PAST SURGICAL HISTORY:  Past Surgical History:  Procedure Laterality Date   ABDOMINAL HYSTERECTOMY      CATARACT EXTRACTION W/PHACO Left 02/07/2016   Procedure: CATARACT EXTRACTION PHACO AND INTRAOCULAR LENS PLACEMENT (IOC);  Surgeon: Sallee Lange, MD;  Location: [facility] ORS;  Service: Ophthalmology;  Laterality: Left;  [place] 01:33 AP% 24.7 CDE 39.57 fluid pack lot # [id number]   EXTRACORPOREAL SHOCK WAVE LITHOTRIPSY Right 07/03/2019   Procedure: EXTRACORPOREAL SHOCK WAVE LITHOTRIPSY (ESWL);  Surgeon: Orson Ape, MD;  Location: [facility] ORS;  Service: Urology;  Laterality: Right;   EYE SURGERY     HIP ARTHROPLASTY Left 04/20/2020   Procedure: ARTHROPLASTY BIPOLAR HIP (HEMIARTHROPLASTY);  Surgeon: Kennedy Bucker, MD;  Location: [facility] ORS;  Service: Orthopedics;  Laterality: Left;   LITHOTRIPSY     TONSILLECTOMY      HEMATOLOGY/ONCOLOGY HISTORY:  Oncology History Overview Note  # LYMPHOCYTOSIS [25,K]/ANEMIA 9-10]; platelets-N; IgVH Somatic Hypermutation was not detected.56% OF NUCLEI POSITIVE FOR A 13Q DELETION; The phenotype is most consistent with a diagnosis of 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 Low level hypermetabolism consistent with CLL.2. Splenomegaly but no focal splenic lesions.  # April 2022- 13 Q DEL; IGVH- UNMUTATED  # MID April 2022- IBRUTINIB 280 mg x3 days [STOPPED sec ER/hypertensive emergency- 191/129]  # MAY 26th, 2022- start acalabrutinib 100 mg once a day [reduced dose]; intermittent; discontinued  September mid 2022. [Muscle cramps.]  # MAY 1st week- 2024- Zanubrutinib 80 mg        CLL (chronic lymphocytic leukemia) (HCC)  02/14/2021 Initial Diagnosis   CLL (chronic lymphocytic leukemia) (HCC)   04/07/2021 Cancer  Staging   Staging form: Chronic Lymphocytic Leukemia / Small Lymphocytic Lymphoma, AJCC 8th Edition - Clinical: Modified Rai Stage III (Modified Rai risk: High, Binet: Stage C) - Signed by Earna Coder, MD on  04/07/2021 Stage prefix: Initial diagnosis   12/16/2021 -  Chemotherapy   Patient is on Treatment Plan : CLL - Rituximab q 4 weeks       ALLERGIES:  is allergic to gabapentin and demerol [meperidine].  MEDICATIONS:  Current Outpatient Medications  Medication Sig Dispense Refill   allopurinol (ZYLOPRIM) 100 MG tablet Take 2 tablets (200 mg total) by mouth daily for 14 days. 28 tablet 0   amitriptyline (ELAVIL) 10 MG tablet Take 20 mg by mouth at bedtime.     amLODipine (NORVASC) 5 MG tablet Take 1 tablet (5 mg total) by mouth daily. (Patient not taking: Reported on 05/01/2023) 2 tablet 0   cyanocobalamin (,VITAMIN B-12,) 1000 MCG/ML injection Inject into the muscle every 14 (fourteen) days.     cyanocobalamin (VITAMIN B12) 1000 MCG tablet Take 1,000 mcg by mouth daily.     DULoxetine (CYMBALTA) 20 MG capsule Take 20 mg by mouth daily.     ENTRESTO 24-26 MG TAKE ONE TABLET BY MOUTH TWO TIMES DAILY 60 tablet 3   feeding supplement, ENSURE ENLIVE, (ENSURE ENLIVE) LIQD Take 237 mLs by mouth 2 (two) times daily between meals. 237 mL 12   furosemide (LASIX) 20 MG tablet Take 1 tablet (20 mg total) by mouth daily. (Patient taking differently: Take 40 mg by mouth daily.) 30 tablet 11   HYDROcodone-acetaminophen (NORCO) 10-325 MG tablet Take 1 tablet by mouth every 6 (six) hours as needed.     sodium zirconium cyclosilicate (LOKELMA) 5 g packet Take 5 g by mouth daily. 11 each 0   zanubrutinib (BRUKINSA) 80 MG capsule Take 1 capsule (80 mg total) by mouth daily. 30 capsule 0   No current facility-administered medications for this visit.    VITAL SIGNS: There were no vitals taken for this visit. There were no vitals filed for this visit.   Estimated body mass index is 18.25 kg/m as calculated from the following:   Height as of 04/30/23: 5\' 2"  (1.575 m).   Weight as of 05/01/23: 99 lb 12.8 oz (45.3 kg).  LABS: CBC:    Component Value Date/Time   WBC 80.8 (HH) 04/30/2023 1107   HGB 8.3 (L)  04/30/2023 1107   HGB 7.3 (L) 04/26/2023 1139   HGB 14.1 03/06/2014 2049   HCT 28.0 (L) 04/30/2023 1107   HCT 42.9 03/06/2014 2049   PLT 226 04/30/2023 1107   PLT 164 04/26/2023 1139   PLT 240 03/06/2014 2049   MCV 104.1 (H) 04/30/2023 1107   MCV 88 03/06/2014 2049   NEUTROABS 4.3 04/30/2023 1107   NEUTROABS 7.3 (H) 03/06/2014 2049   LYMPHSABS 73.8 (H) 04/30/2023 1107   LYMPHSABS 6.6 (H) 03/06/2014 2049   MONOABS 0.6 04/30/2023 1107   MONOABS 1.3 (H) 03/06/2014 2049   EOSABS 1.9 (H) 04/30/2023 1107   EOSABS 0.4 03/06/2014 2049   BASOSABS 0.0 04/30/2023 1107   BASOSABS 0.1 03/06/2014 2049   Comprehensive Metabolic Panel:    Component Value Date/Time   NA 141 05/03/2023 1043   NA 138 03/06/2014 2049   K 5.8 (H) 05/03/2023 1043   K 3.6 03/06/2014 2049   CL 110 05/03/2023 1043   CL 107 03/06/2014 2049   CO2 22 05/03/2023 1043   CO2 29 03/06/2014 2049   BUN 57 (  H) 05/03/2023 1043   BUN 14 03/06/2014 2049   CREATININE 1.50 (H) 05/03/2023 1043   CREATININE 0.75 03/06/2014 2049   GLUCOSE 105 (H) 05/03/2023 1043   GLUCOSE 103 (H) 03/06/2014 2049   CALCIUM 9.0 05/03/2023 1043   CALCIUM 8.9 03/06/2014 2049   AST 20 05/03/2023 1043   ALT 12 05/03/2023 1043   ALKPHOS 94 05/03/2023 1043   BILITOT 0.5 05/03/2023 1043   PROT 6.3 (L) 05/03/2023 1043   ALBUMIN 3.8 05/03/2023 1043    RADIOGRAPHIC STUDIES: No results found.  PERFORMANCE STATUS (ECOG) : 2 - Symptomatic, <50% confined to bed  Review of Systems Unless otherwise noted, a complete review of systems is negative.  Physical Exam General: NAD Cardiovascular: regular rate and rhythm Pulmonary: clear ant fields Abdomen: soft, nontender, + bowel sounds GU: no suprapubic tenderness Extremities: trace BLE edema, no joint deformities Skin: no rashes Neurological: Weakness but otherwise nonfocal  Assessment and Plan:   CLL -patient on Brukinsa with improvement in lymphadenopathy.  Remains with persistent  leukocytosis.  Hyperkalemia - concerning for possible TLS.  Patient started on [facility] with conservative dosing.  She is only taken 1 dose so far.  Discussed with nephrology and will liberalize dosing to 10 g 3 times daily and bring patient back tomorrow for repeat labs.  Will also stop Entresto.  Hypertension -likely secondary to Brukinsa.  Entresto discontinued.  Discussed with Clarisa Kindred, NP and will restart patient on Norvasc.  Continue to follow-up with patient next week.  Elevated uric acid -downtrending on allopurinol  Case and plan discussed with Dr. Donneta Romberg.  Patient to follow-up tomorrow for labs/fluids  Patient expressed understanding and was in agreement with this plan. She also understands that She can call clinic at any time with any questions, concerns, or complaints.   Thank you for allowing me to participate in the care of this very pleasant patient.   Time Total: 20 minutes  Visit consisted of counseling and education dealing with the complex and emotionally intense issues of symptom management in the setting of serious illness.Greater than 50%  of this time was spent counseling and coordinating care related to the above assessment and plan.  Signed by: Laurette Schimke, PhD, NP-C

## 2023-05-03 NOTE — Telephone Encounter (Signed)
Lindsay [name] (Lindsay [name]) Rx #: [id number] Lindsay [name] packets  PA submitted via covermymeds  [employer] Medicare Part D Electronic Prior Authorization Form (2017 [place]CP[place])

## 2023-05-03 NOTE — Patient Instructions (Addendum)
Discontinue Entresto.  2.  Increase Lokelma to 10G three times a day starting today 3.  Please return tomorrow for repeat labs/fluids  4.  Restart Norvasc 5 mg daily

## 2023-05-04 ENCOUNTER — Inpatient Hospital Stay: Payer: Medicare Other

## 2023-05-04 ENCOUNTER — Other Ambulatory Visit: Payer: Self-pay

## 2023-05-04 ENCOUNTER — Telehealth: Payer: Self-pay | Admitting: *Deleted

## 2023-05-04 ENCOUNTER — Encounter: Payer: Self-pay | Admitting: Internal Medicine

## 2023-05-04 ENCOUNTER — Encounter: Payer: Self-pay | Admitting: Hospice and Palliative Medicine

## 2023-05-04 ENCOUNTER — Other Ambulatory Visit (HOSPITAL_COMMUNITY): Payer: Self-pay

## 2023-05-04 VITALS — BP 153/45 | HR 63 | Temp 98.3°F | Resp 20

## 2023-05-04 DIAGNOSIS — C9110 Chronic lymphocytic leukemia of B-cell type not having achieved remission: Secondary | ICD-10-CM

## 2023-05-04 DIAGNOSIS — E611 Iron deficiency: Secondary | ICD-10-CM

## 2023-05-04 DIAGNOSIS — E860 Dehydration: Secondary | ICD-10-CM

## 2023-05-04 DIAGNOSIS — E790 Hyperuricemia without signs of inflammatory arthritis and tophaceous disease: Secondary | ICD-10-CM

## 2023-05-04 LAB — CBC WITH DIFFERENTIAL (CANCER CENTER ONLY)
Abs Immature Granulocytes: 0.3 10*3/uL — ABNORMAL HIGH (ref 0.00–0.07)
Basophils Absolute: 0.1 10*3/uL (ref 0.0–0.1)
Basophils Relative: 0 %
Eosinophils Absolute: 4 10*3/uL — ABNORMAL HIGH (ref 0.0–0.5)
Eosinophils Relative: 4 %
HCT: 28.2 % — ABNORMAL LOW (ref 36.0–46.0)
Hemoglobin: 8.4 g/dL — ABNORMAL LOW (ref 12.0–15.0)
Immature Granulocytes: 0 %
Lymphocytes Relative: 92 %
Lymphs Abs: 93.8 10*3/uL — ABNORMAL HIGH (ref 0.7–4.0)
MCH: 31.1 pg (ref 26.0–34.0)
MCHC: 29.8 g/dL — ABNORMAL LOW (ref 30.0–36.0)
MCV: 104.4 fL — ABNORMAL HIGH (ref 80.0–100.0)
Monocytes Absolute: 0.6 10*3/uL (ref 0.1–1.0)
Monocytes Relative: 1 %
Neutro Abs: 3.4 10*3/uL (ref 1.7–7.7)
Neutrophils Relative %: 3 %
Platelet Count: 243 10*3/uL (ref 150–400)
RBC: 2.7 MIL/uL — ABNORMAL LOW (ref 3.87–5.11)
RDW: 14.5 % (ref 11.5–15.5)
Smear Review: NORMAL
WBC Count: 102.2 10*3/uL (ref 4.0–10.5)
nRBC: 0 % (ref 0.0–0.2)

## 2023-05-04 LAB — CMP (CANCER CENTER ONLY)
ALT: 11 U/L (ref 0–44)
AST: 15 U/L (ref 15–41)
Albumin: 3.9 g/dL (ref 3.5–5.0)
Alkaline Phosphatase: 90 U/L (ref 38–126)
Anion gap: 8 (ref 5–15)
BUN: 52 mg/dL — ABNORMAL HIGH (ref 8–23)
CO2: 22 mmol/L (ref 22–32)
Calcium: 8.3 mg/dL — ABNORMAL LOW (ref 8.9–10.3)
Chloride: 109 mmol/L (ref 98–111)
Creatinine: 1.48 mg/dL — ABNORMAL HIGH (ref 0.44–1.00)
GFR, Estimated: 34 mL/min — ABNORMAL LOW (ref >60)
Glucose, Bld: 101 mg/dL — ABNORMAL HIGH (ref 70–99)
Potassium: 4.4 mmol/L (ref 3.5–5.1)
Sodium: 139 mmol/L (ref 135–145)
Total Bilirubin: 0.4 mg/dL (ref 0.3–1.2)
Total Protein: 6.3 g/dL — ABNORMAL LOW (ref 6.5–8.1)

## 2023-05-04 LAB — URIC ACID: Uric Acid, Serum: 7 mg/dL (ref 2.5–7.1)

## 2023-05-04 LAB — SAMPLE TO BLOOD BANK

## 2023-05-04 LAB — PHOSPHORUS: Phosphorus: 4.4 mg/dL (ref 2.5–4.6)

## 2023-05-04 MED ORDER — SODIUM CHLORIDE 0.9 % IV SOLN
INTRAVENOUS | Status: DC
  Filled 2023-05-04 (×2): qty 250

## 2023-05-04 NOTE — Telephone Encounter (Signed)
1400- Rn returned call to [facility] in cancer center lab- critical value reported wbc 102.2. Read back process performed with lab tech.  1400- verbal report given to Josh, NP of critical wbc count. Read back process performed with NP

## 2023-05-04 NOTE — Progress Notes (Signed)
1430-RN spoke with daughter. Explained that pt should hold the [facility]. Potassium level is 4.4 today. Patient does not want to continue with Brukinsa. Daughter would like to discuss if pt is a candidate for IV chemotherapy. Daughter requested that the blood transfusion time be moved on Tuesday at 100pm due to a conflict w/daughter's personal apts.

## 2023-05-04 NOTE — Telephone Encounter (Signed)
Daughter called reporting that patient has decided she no longer is going to take oral chemotherapy due to all of the side effects she is having from it.

## 2023-05-04 NOTE — Telephone Encounter (Signed)
CAll returned to daughter and I got voice mail and left message to please keep appointment today and Also for Monday to see Dr B and that it is fine if she does not want to take the chemotherapy pill, but to please keep appointment Monday so he can discuss with her any concerns and where to go from here

## 2023-05-07 ENCOUNTER — Telehealth: Payer: Self-pay

## 2023-05-07 ENCOUNTER — Inpatient Hospital Stay: Payer: Medicare Other

## 2023-05-07 ENCOUNTER — Inpatient Hospital Stay (HOSPITAL_BASED_OUTPATIENT_CLINIC_OR_DEPARTMENT_OTHER): Payer: Medicare Other | Admitting: Internal Medicine

## 2023-05-07 ENCOUNTER — Encounter: Payer: Self-pay | Admitting: Internal Medicine

## 2023-05-07 VITALS — BP 172/45 | HR 70 | Temp 98.4°F | Wt 101.2 lb

## 2023-05-07 DIAGNOSIS — E860 Dehydration: Secondary | ICD-10-CM

## 2023-05-07 DIAGNOSIS — C9110 Chronic lymphocytic leukemia of B-cell type not having achieved remission: Secondary | ICD-10-CM

## 2023-05-07 DIAGNOSIS — E611 Iron deficiency: Secondary | ICD-10-CM | POA: Diagnosis not present

## 2023-05-07 DIAGNOSIS — E790 Hyperuricemia without signs of inflammatory arthritis and tophaceous disease: Secondary | ICD-10-CM

## 2023-05-07 LAB — CBC WITH DIFFERENTIAL (CANCER CENTER ONLY)
Abs Immature Granulocytes: 0.2 10*3/uL — ABNORMAL HIGH (ref 0.00–0.07)
Basophils Absolute: 0.1 10*3/uL (ref 0.0–0.1)
Basophils Relative: 0 %
Eosinophils Absolute: 4.1 10*3/uL — ABNORMAL HIGH (ref 0.0–0.5)
Eosinophils Relative: 6 %
HCT: 25.4 % — ABNORMAL LOW (ref 36.0–46.0)
Hemoglobin: 7.7 g/dL — ABNORMAL LOW (ref 12.0–15.0)
Immature Granulocytes: 0 %
Lymphocytes Relative: 89 %
Lymphs Abs: 71.2 10*3/uL — ABNORMAL HIGH (ref 0.7–4.0)
MCH: 31.6 pg (ref 26.0–34.0)
MCHC: 30.3 g/dL (ref 30.0–36.0)
MCV: 104.1 fL — ABNORMAL HIGH (ref 80.0–100.0)
Monocytes Absolute: 0.6 10*3/uL (ref 0.1–1.0)
Monocytes Relative: 1 %
Neutro Abs: 3.6 10*3/uL (ref 1.7–7.7)
Neutrophils Relative %: 4 %
Platelet Count: 210 10*3/uL (ref 150–400)
RBC: 2.44 MIL/uL — ABNORMAL LOW (ref 3.87–5.11)
RDW: 14.3 % (ref 11.5–15.5)
Smear Review: NORMAL
WBC Count: 79.7 10*3/uL (ref 4.0–10.5)
nRBC: 0 % (ref 0.0–0.2)

## 2023-05-07 LAB — CMP (CANCER CENTER ONLY)
ALT: 9 U/L (ref 0–44)
AST: 15 U/L (ref 15–41)
Albumin: 3.7 g/dL (ref 3.5–5.0)
Alkaline Phosphatase: 79 U/L (ref 38–126)
Anion gap: 9 (ref 5–15)
BUN: 36 mg/dL — ABNORMAL HIGH (ref 8–23)
CO2: 21 mmol/L — ABNORMAL LOW (ref 22–32)
Calcium: 8.2 mg/dL — ABNORMAL LOW (ref 8.9–10.3)
Chloride: 109 mmol/L (ref 98–111)
Creatinine: 1.27 mg/dL — ABNORMAL HIGH (ref 0.44–1.00)
GFR, Estimated: 41 mL/min — ABNORMAL LOW (ref >60)
Glucose, Bld: 115 mg/dL — ABNORMAL HIGH (ref 70–99)
Potassium: 3.9 mmol/L (ref 3.5–5.1)
Sodium: 139 mmol/L (ref 135–145)
Total Bilirubin: 0.5 mg/dL (ref 0.3–1.2)
Total Protein: 5.8 g/dL — ABNORMAL LOW (ref 6.5–8.1)

## 2023-05-07 LAB — SAMPLE TO BLOOD BANK

## 2023-05-07 LAB — URIC ACID: Uric Acid, Serum: 6.5 mg/dL (ref 2.5–7.1)

## 2023-05-07 LAB — PHOSPHORUS: Phosphorus: 3 mg/dL (ref 2.5–4.6)

## 2023-05-07 NOTE — Assessment & Plan Note (Signed)
#   CLL: [56% OF NUCLEI POSITIVE FOR A 13Q DELETION]; April 2022-PET scan shows significant lymphadenopathy above and below diaphragm; splenomegaly. Discontinued Ibrutinib [hypertensive emergencies] acalabrutinib [leg cramps even with minimum dose].  # Patient tolerated zanubrutinib poorly-with hypokalemia loss and renal failure/extreme fatigue etc.  She discontinued zanu.   # Discussed option of rituximab infusion weekly x 4.  Again discussed the potential side effects including but not limited to acute reactions risk of infections renal failure etc.  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  Hemoglobin today is 8.8.  Hold off any transfusion.  See discussion below  # Cardiac -history of CHF / elevated blood pressure-overall stable continue monitoring at home.  # Worsening renal function GFR 40s.  Underlying diuretic/CHF/possible tumor lysis.  # Prognosis: I had a long discussion the patient and daughter regarding overall poor prognosis of untreated progressive CLL.  Patient unfortunately has poor tolerance to therapy so far.  She declines any other alternative therapies.  Discussed regarding hospice.  Patient is open to discussion on that she does not need hospice services right away.  For now patient will follow-up with PCP and follow-up with [place] only as needed.  # DISPOSITION: # refer to hospice re: CLL # cancel Blood tomorrow # follow up as needed-  dr.B  # 40 minutes face-to-face with the patient discussing the above plan of care; more than 50% of time spent on prognosis/ natural history; counseling and coordination.   Dr.Hedrick

## 2023-05-07 NOTE — Progress Notes (Signed)
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 Low level hypermetabolism consistent with CLL.2. Splenomegaly but no focal splenic lesions.  # April 2022- 13 Q DEL; IGVH- UNMUTATED  # MID April 2022- IBRUTINIB 280 mg x3 days [STOPPED sec ER/hypertensive emergency- 191/129]  # MAY 26th, 2022- start acalabrutinib 100 mg once a day [reduced dose]; intermittent; discontinued  September mid 2022. [Muscle cramps.]  # MAY 1st week- 2024- Zanubrutinib 80 mg; stopped after 1-2 weeks- sec to acute renal failure hyperkalemia/extreme fatigue etc.  Patient declined further therapies.       CLL (chronic lymphocytic leukemia) (HCC)  02/14/2021 Initial Diagnosis   CLL (chronic lymphocytic leukemia) (HCC)   04/07/2021 Cancer Staging   Staging form: Chronic Lymphocytic Leukemia / Small Lymphocytic Lymphoma, AJCC 8th Edition - Clinical: Modified Rai Stage III (Modified Rai risk: High, Binet: Stage C) - Signed by Earna Coder, MD on 04/07/2021 Stage prefix: Initial diagnosis   12/16/2021 -  Chemotherapy   Patient is on Treatment Plan : CLL - Rituximab q 4 weeks        HISTORY OF PRESENTING ILLNESS: Frail-appearing Caucasian female patient/accompanied by daughter.  Ambulating independently.  Lindsay [name] 89 y.o.  female symptomatic CLL currently on on surveillance because of intolerance to ibrutinib/acalabrutinib/zanubrutinib is here for follow-up.  Patient started on  Zanubrutinib approximately month ago.  However she stopped because of poor tolerance-hyperkalemia/renal failure extreme fatigue etc.   Also noticed significant increasing size of the neck lymph nodes/underarm lymph nodes.  Review of Systems  Constitutional:  Positive for malaise/fatigue and weight loss. Negative for chills, diaphoresis and fever.  HENT:  Negative for nosebleeds and sore throat.   Eyes:  Negative for double vision.  Respiratory:  Negative for cough, hemoptysis, sputum production, shortness of breath and wheezing.   Cardiovascular:  Negative for chest pain, palpitations, orthopnea and leg swelling.  Gastrointestinal:  Negative for abdominal pain, constipation, diarrhea, heartburn, melena, nausea and vomiting.  Genitourinary:  Negative for dysuria, frequency and urgency.  Musculoskeletal:  Positive for joint pain. Negative for back pain.  Skin: Negative.  Negative for itching and rash.  Neurological:  Negative for dizziness, tingling, focal weakness, weakness and headaches.  Endo/Heme/Allergies:  Bruises/bleeds easily.  Psychiatric/Behavioral:  Negative for depression. The patient is not nervous/anxious and does not have insomnia.      MEDICAL HISTORY:  Past Medical History:  Diagnosis Date   Arthritis    RHEUMATOID   CHF (congestive heart failure) (HCC)    CLL (chronic lymphocytic leukemia) (HCC)    Edema    MILD ANKLE OCCAS   Fibromyalgia    GERD (gastroesophageal reflux disease)    Hypertension    Kidney stones    RLS (restless legs syndrome)     SURGICAL HISTORY: Past Surgical History:  Procedure Laterality Date   ABDOMINAL HYSTERECTOMY     CATARACT EXTRACTION W/PHACO Left 02/07/2016   Procedure: CATARACT EXTRACTION PHACO AND INTRAOCULAR LENS PLACEMENT (IOC);  Surgeon: Sallee Lange, MD;  Location: [facility] ORS;  Service: Ophthalmology;  Laterality: Left;  [place] 01:33 AP% 24.7  CDE 39.57 fluid pack lot # [id number]   EXTRACORPOREAL SHOCK WAVE LITHOTRIPSY Right  07/03/2019   Procedure: EXTRACORPOREAL SHOCK WAVE LITHOTRIPSY (ESWL);  Surgeon: Orson Ape, MD;  Location: [facility] ORS;  Service: Urology;  Laterality: Right;   EYE SURGERY     HIP ARTHROPLASTY Left 04/20/2020   Procedure: ARTHROPLASTY BIPOLAR HIP (HEMIARTHROPLASTY);  Surgeon: Kennedy Bucker, MD;  Location: [facility] ORS;  Service: Orthopedics;  Laterality: Left;   LITHOTRIPSY     TONSILLECTOMY      SOCIAL HISTORY: Social History   Socioeconomic History   Marital status: Widowed    Spouse name: Not on file   Number of children: Not on file   Years of education: Not on file   Highest education level: Not on file  Occupational History   Not on file  Tobacco Use   Smoking status: Never   Smokeless tobacco: Never  Substance and Sexual Activity   Alcohol use: No   Drug use: Never   Sexual activity: Not on file  Other Topics Concern   Not on file  Social History Narrative   Lives with daughter, in [place]. 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    Social Determinants of Health   Financial Resource Strain: Not on file  Food Insecurity: Not on file  Transportation Needs: Not on file  Physical Activity: Not on file  Stress: Not on file  Social Connections: Not on file  Intimate Partner Violence: Not on file    FAMILY HISTORY: Family History  Problem Relation Age of Onset   Cancer Mother        uterine cancer    ALLERGIES:  is allergic to gabapentin and demerol [meperidine].  MEDICATIONS:  Current Outpatient Medications  Medication Sig Dispense Refill   allopurinol (ZYLOPRIM) 100 MG tablet Take 2 tablets (200 mg total) by mouth daily for 14 days. 28 tablet 0   amitriptyline (ELAVIL) 10 MG tablet Take 20 mg by mouth at bedtime.     amLODipine (NORVASC) 5 MG tablet Take 1 tablet (5 mg total) by mouth daily. 30 tablet 0   cyanocobalamin (,VITAMIN B-12,) 1000 MCG/ML injection Inject into the muscle every 14 (fourteen) days.     cyanocobalamin (VITAMIN B12) 1000 MCG  tablet Take 1,000 mcg by mouth daily.     DULoxetine (CYMBALTA) 20 MG capsule Take 20 mg by mouth daily.     feeding supplement, ENSURE ENLIVE, (ENSURE ENLIVE) LIQD Take 237 mLs by mouth 2 (two) times daily between meals. 237 mL 12   furosemide (LASIX) 20 MG tablet Take 1 tablet (20 mg total) by mouth daily. (Patient taking differently: Take 40 mg by mouth daily.) 30 tablet 11   HYDROcodone-acetaminophen (NORCO) 10-325 MG tablet Take 1 tablet by mouth every 6 (six) hours as needed.     sodium zirconium cyclosilicate (LOKELMA) 5 g packet Take 10 g by mouth 3 (three) times daily. 11 each 0   zanubrutinib (BRUKINSA) 80 MG capsule Take 1 capsule (80 mg total) by mouth daily. 30 capsule 0   No current facility-administered medications for this visit.      Marland Kitchen  PHYSICAL EXAMINATION: ECOG PERFORMANCE STATUS: 0 - Asymptomatic  Vitals:   05/07/23 1321  BP: (!) 172/45  Pulse: 70  Temp: 98.4 F (36.9 C)  SpO2: 100%   Filed Weights   05/07/23 1321  Weight: 101 lb 3.2 oz (45.9 kg)   Bulky bilateral neck adenopathy; underarm adenopathy.  Physical Exam HENT:     Head: Normocephalic  and atraumatic.     Mouth/Throat:     Pharynx: No oropharyngeal exudate.  Eyes:     Pupils: Pupils are equal, round, and reactive to light.  Cardiovascular:     Rate and Rhythm: Normal rate and regular rhythm.  Pulmonary:     Effort: Pulmonary effort is normal. No respiratory distress.     Breath sounds: Normal breath sounds. No wheezing.  Abdominal:     General: Bowel sounds are normal. There is no distension.     Palpations: Abdomen is soft. There is no mass.     Tenderness: There is no abdominal tenderness. There is no guarding or rebound.  Musculoskeletal:        General: No tenderness. Normal range of motion.     Cervical back: Normal range of motion and neck supple.  Skin:    General: Skin is warm.     Comments: Chronic multiple bruises noted.  Neurological:     Mental Status: She is alert and  oriented to person, place, and time.  Psychiatric:        Mood and Affect: Affect normal.      LABORATORY DATA:  I have reviewed the data as listed Lab Results  Component Value Date   WBC 102.2 (HH) 05/04/2023   HGB 8.4 (L) 05/04/2023   HCT 28.2 (L) 05/04/2023   MCV 104.4 (H) 05/04/2023   PLT 243 05/04/2023   Recent Labs    07/16/22 2014 07/17/22 0523 05/03/23 1043 05/04/23 1331 05/07/23 1300  NA 140   < > 141 139 139  K 4.3   < > 5.8* 4.4 3.9  CL 109   < > 110 109 109  CO2 22   < > 22 22 21*  GLUCOSE 173*   < > 105* 101* 115*  BUN 29*   < > 57* 52* 36*  CREATININE 1.24*   < > 1.50* 1.48* 1.27*  CALCIUM 9.1   < > 9.0 8.3* 8.2*  GFRNONAA 42*   < > 34* 34* 41*  PROT 6.6   < > 6.3* 6.3* 5.8*  ALBUMIN 4.3   < > 3.8 3.9 3.7  AST 28   < > 20 15 15   ALT 13   < > 12 11 9   ALKPHOS 102   < > 94 90 79  BILITOT 1.0   < > 0.5 0.4 0.5  BILIDIR 0.2  --   --   --   --   IBILI 0.8  --   --   --   --    < > = values in this interval not displayed.    RADIOGRAPHIC STUDIES: I have personally reviewed the radiological images as listed and agreed with the findings in the report. No results found.  ASSESSMENT & PLAN:   CLL (chronic lymphocytic leukemia) (HCC) # CLL: [56% OF NUCLEI POSITIVE FOR A 13Q DELETION]; April 2022-PET scan shows significant lymphadenopathy above and below diaphragm; splenomegaly. Discontinued Ibrutinib [hypertensive emergencies] acalabrutinib [leg cramps even with minimum dose].  # Patient tolerated zanubrutinib poorly-with hypokalemia loss and renal failure/extreme fatigue etc.  She discontinued zanu.   # Discussed option of rituximab infusion weekly x 4.  Again discussed the potential side effects including but not limited to acute reactions risk of infections renal failure etc.  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  Hemoglobin today is 8.8.  Hold off any  transfusion.  See discussion below  #  Cardiac -history of CHF / elevated blood pressure-overall stable continue monitoring at home.  # Worsening renal function GFR 40s.  Underlying diuretic/CHF/possible tumor lysis.  # Prognosis: I had a long discussion the patient and daughter regarding overall poor prognosis of untreated progressive CLL.  Patient unfortunately has poor tolerance to therapy so far.  She declines any other alternative therapies.  Discussed regarding hospice.  Patient is open to discussion on that she does not need hospice services right away.  For now patient will follow-up with PCP and follow-up with [place] only as needed.  # DISPOSITION: # refer to hospice re: CLL # cancel Blood tomorrow # follow up as needed-  dr.B  # 40 minutes face-to-face with the patient discussing the above plan of care; more than 50% of time spent on prognosis/ natural history; counseling and coordination.   Dr.Hedrick   All questions were answered. The patient knows to call the clinic with any problems, questions or concerns.    Earna Coder, MD 05/07/2023 1:57 PM

## 2023-05-07 NOTE — Telephone Encounter (Signed)
WBC 79.7 critical lab, MD notifed

## 2023-05-08 ENCOUNTER — Inpatient Hospital Stay: Payer: Medicare Other

## 2023-05-08 ENCOUNTER — Other Ambulatory Visit (HOSPITAL_COMMUNITY): Payer: Self-pay

## 2023-05-08 ENCOUNTER — Telehealth: Payer: Self-pay | Admitting: *Deleted

## 2023-05-08 NOTE — Telephone Encounter (Signed)
Call from Hospice asking if Dr B will serve as Hospice attending and sign orders on this patient. Please advise

## 2023-05-08 NOTE — Telephone Encounter (Signed)
Verbal order called to Hospice that Dr B will serve as attending and sign orders. FAmily request to not open patient to services until 1st week of June

## 2023-05-10 ENCOUNTER — Other Ambulatory Visit: Payer: Medicare Other

## 2023-05-10 ENCOUNTER — Ambulatory Visit: Payer: Medicare Other | Admitting: Internal Medicine

## 2023-05-11 ENCOUNTER — Encounter: Payer: Medicare Other | Admitting: Family

## 2023-05-11 ENCOUNTER — Ambulatory Visit: Payer: Medicare Other

## 2023-05-15 ENCOUNTER — Encounter: Payer: Self-pay | Admitting: *Deleted

## 2023-05-15 ENCOUNTER — Other Ambulatory Visit: Payer: Self-pay | Admitting: *Deleted

## 2023-05-16 ENCOUNTER — Inpatient Hospital Stay: Payer: Medicare Other | Admitting: Hospice and Palliative Medicine

## 2023-05-16 ENCOUNTER — Encounter: Payer: Medicare Other | Admitting: Family

## 2023-05-21 ENCOUNTER — Inpatient Hospital Stay: Payer: Medicare Other | Attending: Internal Medicine | Admitting: Hospice and Palliative Medicine

## 2023-05-21 DIAGNOSIS — C9110 Chronic lymphocytic leukemia of B-cell type not having achieved remission: Secondary | ICD-10-CM

## 2023-05-21 NOTE — Progress Notes (Signed)
Patient followed by hospice. VM left for daughter.

## 2023-05-23 ENCOUNTER — Encounter: Payer: Self-pay | Admitting: Pharmacist

## 2023-05-23 ENCOUNTER — Encounter: Payer: Self-pay | Admitting: Family

## 2023-05-23 ENCOUNTER — Ambulatory Visit: Payer: Medicare Other | Attending: Family | Admitting: Family

## 2023-05-23 VITALS — BP 170/43 | HR 76 | Wt 102.2 lb

## 2023-05-23 DIAGNOSIS — D649 Anemia, unspecified: Secondary | ICD-10-CM | POA: Insufficient documentation

## 2023-05-23 DIAGNOSIS — M797 Fibromyalgia: Secondary | ICD-10-CM | POA: Diagnosis not present

## 2023-05-23 DIAGNOSIS — Z87891 Personal history of nicotine dependence: Secondary | ICD-10-CM | POA: Diagnosis not present

## 2023-05-23 DIAGNOSIS — N189 Chronic kidney disease, unspecified: Secondary | ICD-10-CM | POA: Diagnosis not present

## 2023-05-23 DIAGNOSIS — I428 Other cardiomyopathies: Secondary | ICD-10-CM | POA: Diagnosis not present

## 2023-05-23 DIAGNOSIS — I130 Hypertensive heart and chronic kidney disease with heart failure and stage 1 through stage 4 chronic kidney disease, or unspecified chronic kidney disease: Secondary | ICD-10-CM | POA: Insufficient documentation

## 2023-05-23 DIAGNOSIS — G2581 Restless legs syndrome: Secondary | ICD-10-CM | POA: Insufficient documentation

## 2023-05-23 DIAGNOSIS — Z79899 Other long term (current) drug therapy: Secondary | ICD-10-CM | POA: Insufficient documentation

## 2023-05-23 DIAGNOSIS — I5022 Chronic systolic (congestive) heart failure: Secondary | ICD-10-CM

## 2023-05-23 DIAGNOSIS — C9110 Chronic lymphocytic leukemia of B-cell type not having achieved remission: Secondary | ICD-10-CM | POA: Diagnosis not present

## 2023-05-23 DIAGNOSIS — Z87440 Personal history of urinary (tract) infections: Secondary | ICD-10-CM | POA: Insufficient documentation

## 2023-05-23 DIAGNOSIS — K219 Gastro-esophageal reflux disease without esophagitis: Secondary | ICD-10-CM | POA: Insufficient documentation

## 2023-05-23 NOTE — Progress Notes (Signed)
PCP: Lindsay Mina, MD (last seen 04/24) Primary Cardiologist: None  HPI:  Lindsay [name] is a 89 y/o female with a history of HTN, CKD, CLL, fibromyalgia, GERD, RLS and heart failure.   Echo 05/30/22: EF of 45-50% along with mild/moderate MR  Has not been admitted or been in the ED in the last 6 months.   She presents today for a HF follow-up visit with a chief complaint of moderate fatigue with little exertion. Chronic in nature. Has associated intermittent palpitations along with this. Denies chest pain, SOB, cough, edema, dizziness or difficulty sleeping. Does have enlarging lymph nodes on neck and under the arms.   Has had brief contact with hospice but hasn't scheduled an appointment to get established yet.   She was unable to tolerate treatment of CLL with zanubrutinib due to hyperkalemia, renal failure and extreme fatigue and has elected to stop any other treatment for this. Did take 2-3 doses of lokelma. Also noticed significant increasing size of the neck lymph nodes/underarm lymph nodes. Oncology has made hospice referral. Lindsay [name] had been stopped due to hyperkalemia and worsening renal function while on zanubrutinib.   ROS: All systems negative except as listed in HPI, PMH and Problem List.  SH:  Social History   Socioeconomic History   Marital status: Widowed    Spouse name: Not on file   Number of children: Not on file   Years of education: Not on file   Highest education level: Not on file  Occupational History   Not on file  Tobacco Use   Smoking status: Never   Smokeless tobacco: Never  Substance and Sexual Activity   Alcohol use: No   Drug use: Never   Sexual activity: Not on file  Other Topics Concern   Not on file  Social History Narrative   Lives with daughter, in [place]. 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    Social Determinants of Health   Financial Resource Strain: Not on file  Food Insecurity: Not on file  Transportation Needs: Not  on file  Physical Activity: Not on file  Stress: Not on file  Social Connections: Not on file  Intimate Partner Violence: Not on file    FH:  Family History  Problem Relation Age of Onset   Cancer Mother        uterine cancer    Past Medical History:  Diagnosis Date   Arthritis    RHEUMATOID   CHF (congestive heart failure) (HCC)    CLL (chronic lymphocytic leukemia) (HCC)    Edema    MILD ANKLE OCCAS   Fibromyalgia    GERD (gastroesophageal reflux disease)    Hypertension    Kidney stones    RLS (restless legs syndrome)     Current Outpatient Medications  Medication Sig Dispense Refill   allopurinol (ZYLOPRIM) 100 MG tablet Take 2 tablets (200 mg total) by mouth daily for 14 days. 28 tablet 0   amitriptyline (ELAVIL) 10 MG tablet Take 20 mg by mouth at bedtime.     amLODipine (NORVASC) 5 MG tablet Take 1 tablet (5 mg total) by mouth daily. 30 tablet 0   cyanocobalamin (,VITAMIN B-12,) 1000 MCG/ML injection Inject into the muscle every 14 (fourteen) days.     cyanocobalamin (VITAMIN B12) 1000 MCG tablet Take 1,000 mcg by mouth daily.     DULoxetine (CYMBALTA) 20 MG capsule Take 20 mg by mouth daily.     feeding supplement, ENSURE ENLIVE, (ENSURE ENLIVE) LIQD Take 237 mLs  by mouth 2 (two) times daily between meals. 237 mL 12   furosemide (LASIX) 20 MG tablet Take 1 tablet (20 mg total) by mouth daily. (Patient taking differently: Take 40 mg by mouth daily.) 30 tablet 11   HYDROcodone-acetaminophen (NORCO) 10-325 MG tablet Take 1 tablet by mouth every 6 (six) hours as needed.     sodium zirconium cyclosilicate (LOKELMA) 5 g packet Take 10 g by mouth 3 (three) times daily. 11 each 0   zanubrutinib (BRUKINSA) 80 MG capsule Take 1 capsule (80 mg total) by mouth daily. 30 capsule 0   No current facility-administered medications for this visit.   Vitals:   05/23/23 1138  BP: (!) 170/43  Pulse: 76  SpO2: 96%  Weight: 102 lb 3.2 oz (46.4 kg)   Wt Readings from Last 3  Encounters:  05/23/23 102 lb 3.2 oz (46.4 kg)  05/07/23 101 lb 3.2 oz (45.9 kg)  05/03/23 99 lb 6.4 oz (45.1 kg)   Lab Results  Component Value Date   CREATININE 1.27 (H) 05/07/2023   CREATININE 1.48 (H) 05/04/2023   CREATININE 1.50 (H) 05/03/2023   PHYSICAL EXAM:  General:  Well appearing. No resp difficulty HEENT: normal Neck: supple. JVP flat. Enlarged lymph nodes bilaterally Cor: PMI normal. Regular rate & rhythm. No rubs, gallops or murmurs. Lungs: clear Abdomen: soft, nontender, nondistended. No hepatosplenomegaly. No bruits or masses.  Extremities: no cyanosis, clubbing, rash, edema Neuro: alert & orientedx3, cranial nerves grossly intact. Moves all 4 extremities w/o difficulty. Affect pleasant.  ECG: not done  ASSESSMENT & PLAN:  1: NICM with mildly reduced ejection fraction- - likely HTN/ CLL - NYHA class III - euvolemic - weighing daily; reminded to call for an overnight weight gain of > 2 pounds or a weekly weight gain of > 5 pounds - weight up 3 pounds from last visit here 3 weeks ago - Echo 05/30/22: EF of 45-50% along with mild/moderate MR; plan to update echo at her next visit - watching her sodium intake closely - continue furosemide 40mg  daily - resume entresto 24/26mg  BID; plan to titrate up at next visit - will check BMP in 2 weeks to make sure potassium and renal function remain stable - history of recurrent UTI so most likely will not be a candidate for SGLT2 - BNP 07/16/22 was 551.3 - PharmD reconciled meds w/ patient  2: HTN- - BP 170/43; resuming entresto per above - saw PCP Lindsay [name]) 04/24 - BMP done 05/07/23 reviewed and showed sodium 139, potassium 3.9, creatinine 1.27 and GFR 41  3: Anemia- - taking oral B12 daily and injection every 2 weeks - hemoglobin 05/07/23 was 7.7 - vitamin B12 level 04/30/23 was 1164  4: CLL- - saw oncology Lindsay [name]) 05/24 & patient elected to stop any further treatments due to to side effects; referral to hospice  was made - went to cancer center symptom clinic and saw Lindsay Schimke, NP 05/24 - WBC 05/07/23 was 79.7  Return in 1 month, sooner if needed.

## 2023-05-23 NOTE — Patient Instructions (Signed)
Resume entresto as 1 tablet in the morning and 1 tablet in the evening.    In 2 weeks, [facility] to [facility] desk to get registered for lab work.

## 2023-05-29 NOTE — Progress Notes (Signed)
Patient ID: Lindsay [name], female   DOB: [birth year], 89 y.o.   MRN: [id number] [facility] - PHARMACIST COUNSELING NOTE  Guideline-Directed Medical Therapy/Evidence Based Medicine  ACE/ARB/ARNI:  none - held d/t AKI Beta Blocker:  none Aldosterone Antagonist:  none Diuretic:  none SGLT2i:  none - recurrent UTIs  Adherence Assessment  Do you ever forget to take your medication? [] Yes [x] No  Do you ever skip doses due to side effects? [] Yes [x] No  Do you have trouble affording your medicines? [] Yes [x] No  Are you ever unable to pick up your medication due to transportation difficulties? [] Yes [x] No  Do you ever stop taking your medications because you don't believe they are helping? [] Yes [x] No  Do you check your weight daily? [x] Yes [] No   Adherence strategy: pill box  Barriers to obtaining medications: none  Vital signs: HR 76, BP 170/43, weight (pounds) 102lb  ECHO: Date 05/30/22, EF of 45-50% along with mild/moderate MR      Latest Ref Rng & Units 05/07/2023    1:00 PM 05/04/2023    1:31 PM 05/03/2023   10:43 AM  BMP  Glucose 70 - 99 mg/dL 295  284  132   BUN 8 - 23 mg/dL 36  52  57   Creatinine 0.44 - 1.00 mg/dL 4.40  1.02  7.25   Sodium 135 - 145 mmol/L 139  139  141   Potassium 3.5 - 5.1 mmol/L 3.9  4.4  5.8   Chloride 98 - 111 mmol/L 109  109  110   CO2 22 - 32 mmol/L 21  22  22    Calcium 8.9 - 10.3 mg/dL 8.2  8.3  9.0     Past Medical History:  Diagnosis Date   Arthritis    RHEUMATOID   CHF (congestive heart failure) (HCC)    CLL (chronic lymphocytic leukemia) (HCC)    Edema    MILD ANKLE OCCAS   Fibromyalgia    GERD (gastroesophageal reflux disease)    Hypertension    Kidney stones    RLS (restless legs syndrome)     ASSESSMENT 89 year old female who presents to the HF clinic for follow up accompany by daughter.  Entresto held dur to AKI caused by chemotherapy. AKI now resolved and BP elevated. Patient  received referral to hospice care but doing well with Entresto in the past and requesting to resume medication. MedRec completed during visit.  PLAN  Resume Entresto 24/26 po BID Repeat BMET in 2 weeks   Time spent: 15 minutes  Shweta Aman Rodriguez-Guzman PharmD, BCPS 05/29/2023 3:02 PM    Current Outpatient Medications:    amitriptyline (ELAVIL) 10 MG tablet, Take 20 mg by mouth at bedtime., Disp: , Rfl:    cyanocobalamin (,VITAMIN B-12,) 1000 MCG/ML injection, Inject into the muscle every 14 (fourteen) days., Disp: , Rfl:    cyanocobalamin (VITAMIN B12) 1000 MCG tablet, Take 1,000 mcg by mouth daily., Disp: , Rfl:    DULoxetine (CYMBALTA) 20 MG capsule, Take 20 mg by mouth daily., Disp: , Rfl:    feeding supplement, ENSURE ENLIVE, (ENSURE ENLIVE) LIQD, Take 237 mLs by mouth 2 (two) times daily between meals., Disp: 237 mL, Rfl: 12   furosemide (LASIX) 20 MG tablet, Take 1 tablet (20 mg total) by mouth daily. (Patient taking differently: Take 40 mg by mouth daily.), Disp: 30 tablet, Rfl: 11   HYDROcodone-acetaminophen (NORCO) 10-325 MG tablet, Take 1 tablet by mouth every 6 (six) hours  as needed., Disp: , Rfl:    sacubitril-valsartan (ENTRESTO) 24-26 MG, Take 1 tablet by mouth 2 (two) times daily., Disp: , Rfl:    sodium zirconium cyclosilicate (LOKELMA) 5 g packet, Take 10 g by mouth 3 (three) times daily. (Patient not taking: Reported on 05/23/2023), Disp: 11 each, Rfl: 0    MEDICATION ADHERENCES TIPS AND STRATEGIES Taking medication as prescribed improves patient outcomes in heart failure (reduces hospitalizations, improves symptoms, increases survival) Side effects of medications can be managed by decreasing doses, switching agents, stopping drugs, or adding additional therapy. Please let someone in the Heart Failure Clinic know if you have having bothersome side effects so we can modify your regimen. Do not alter your medication regimen without talking to [place].  Medication reminders can  help patients remember to take drugs on time. If you are missing or forgetting doses you can try linking behaviors, using pill boxes, or an electronic reminder like an alarm on your phone or an app. Some people can also get automated phone calls as medication reminders.

## 2023-06-04 ENCOUNTER — Other Ambulatory Visit (HOSPITAL_COMMUNITY): Payer: Self-pay

## 2023-06-06 ENCOUNTER — Other Ambulatory Visit (HOSPITAL_COMMUNITY): Payer: Self-pay

## 2023-06-18 ENCOUNTER — Other Ambulatory Visit: Payer: Self-pay

## 2023-06-18 ENCOUNTER — Other Ambulatory Visit: Payer: Medicare Other

## 2023-06-18 ENCOUNTER — Encounter: Payer: Self-pay | Admitting: Internal Medicine

## 2023-06-18 ENCOUNTER — Emergency Department: Payer: Medicare Other

## 2023-06-18 ENCOUNTER — Observation Stay
Admission: EM | Admit: 2023-06-18 | Discharge: 2023-06-19 | Disposition: A | Discharge status: Home / Self Care | Payer: Medicare Other | Attending: Emergency Medicine | Admitting: Emergency Medicine

## 2023-06-18 DIAGNOSIS — C9110 Chronic lymphocytic leukemia of B-cell type not having achieved remission: Secondary | ICD-10-CM | POA: Diagnosis not present

## 2023-06-18 DIAGNOSIS — N1832 Chronic kidney disease, stage 3b: Secondary | ICD-10-CM | POA: Diagnosis present

## 2023-06-18 DIAGNOSIS — D649 Anemia, unspecified: Secondary | ICD-10-CM | POA: Diagnosis not present

## 2023-06-18 DIAGNOSIS — F32A Depression, unspecified: Secondary | ICD-10-CM | POA: Diagnosis present

## 2023-06-18 DIAGNOSIS — I5A Non-ischemic myocardial injury (non-traumatic): Secondary | ICD-10-CM | POA: Diagnosis present

## 2023-06-18 DIAGNOSIS — Z79899 Other long term (current) drug therapy: Secondary | ICD-10-CM | POA: Insufficient documentation

## 2023-06-18 DIAGNOSIS — I130 Hypertensive heart and chronic kidney disease with heart failure and stage 1 through stage 4 chronic kidney disease, or unspecified chronic kidney disease: Secondary | ICD-10-CM | POA: Diagnosis not present

## 2023-06-18 DIAGNOSIS — I509 Heart failure, unspecified: Secondary | ICD-10-CM

## 2023-06-18 DIAGNOSIS — R0602 Shortness of breath: Secondary | ICD-10-CM

## 2023-06-18 DIAGNOSIS — D539 Nutritional anemia, unspecified: Secondary | ICD-10-CM | POA: Diagnosis present

## 2023-06-18 DIAGNOSIS — I10 Essential (primary) hypertension: Secondary | ICD-10-CM | POA: Diagnosis present

## 2023-06-18 DIAGNOSIS — E440 Moderate protein-calorie malnutrition: Secondary | ICD-10-CM | POA: Diagnosis present

## 2023-06-18 DIAGNOSIS — F322 Major depressive disorder, single episode, severe without psychotic features: Secondary | ICD-10-CM

## 2023-06-18 DIAGNOSIS — N1831 Chronic kidney disease, stage 3a: Secondary | ICD-10-CM | POA: Diagnosis not present

## 2023-06-18 DIAGNOSIS — Z96642 Presence of left artificial hip joint: Secondary | ICD-10-CM | POA: Insufficient documentation

## 2023-06-18 DIAGNOSIS — I5043 Acute on chronic combined systolic (congestive) and diastolic (congestive) heart failure: Principal | ICD-10-CM | POA: Diagnosis present

## 2023-06-18 LAB — COMPREHENSIVE METABOLIC PANEL
ALT: 10 U/L (ref 0–44)
AST: 16 U/L (ref 15–41)
Albumin: 3.8 g/dL (ref 3.5–5.0)
Alkaline Phosphatase: 84 U/L (ref 38–126)
Anion gap: 10 (ref 5–15)
BUN: 32 mg/dL — ABNORMAL HIGH (ref 8–23)
CO2: 18 mmol/L — ABNORMAL LOW (ref 22–32)
Calcium: 8.4 mg/dL — ABNORMAL LOW (ref 8.9–10.3)
Chloride: 113 mmol/L — ABNORMAL HIGH (ref 98–111)
Creatinine, Ser: 1.33 mg/dL — ABNORMAL HIGH (ref 0.44–1.00)
GFR, Estimated: 39 mL/min — ABNORMAL LOW (ref >60)
Glucose, Bld: 116 mg/dL — ABNORMAL HIGH (ref 70–99)
Potassium: 3.9 mmol/L (ref 3.5–5.1)
Sodium: 141 mmol/L (ref 135–145)
Total Bilirubin: 0.7 mg/dL (ref 0.3–1.2)
Total Protein: 5.6 g/dL — ABNORMAL LOW (ref 6.5–8.1)

## 2023-06-18 LAB — TROPONIN I (HIGH SENSITIVITY)
Troponin I (High Sensitivity): 80 ng/L — ABNORMAL HIGH (ref <18)
Troponin I (High Sensitivity): 116 ng/L (ref <18)
Troponin I (High Sensitivity): 163 ng/L (ref <18)
Troponin I (High Sensitivity): 185 ng/L (ref <18)
Troponin I (High Sensitivity): 157 ng/L (ref <18)

## 2023-06-18 LAB — CBC WITH DIFFERENTIAL/PLATELET
Abs Immature Granulocytes: 0.14 10*3/uL — ABNORMAL HIGH (ref 0.00–0.07)
Basophils Absolute: 0.1 10*3/uL (ref 0.0–0.1)
Basophils Relative: 0 %
Eosinophils Absolute: 2.1 10*3/uL — ABNORMAL HIGH (ref 0.0–0.5)
Eosinophils Relative: 4 %
HCT: 30 % — ABNORMAL LOW (ref 36.0–46.0)
Hemoglobin: 8.8 g/dL — ABNORMAL LOW (ref 12.0–15.0)
Immature Granulocytes: 0 %
Lymphocytes Relative: 87 %
Lymphs Abs: 51.2 10*3/uL — ABNORMAL HIGH (ref 0.7–4.0)
MCH: 31.1 pg (ref 26.0–34.0)
MCHC: 29.3 g/dL — ABNORMAL LOW (ref 30.0–36.0)
MCV: 106 fL — ABNORMAL HIGH (ref 80.0–100.0)
Monocytes Absolute: 1.3 10*3/uL — ABNORMAL HIGH (ref 0.1–1.0)
Monocytes Relative: 2 %
Neutro Abs: 4.1 10*3/uL (ref 1.7–7.7)
Neutrophils Relative %: 7 %
Platelets: 141 10*3/uL — ABNORMAL LOW (ref 150–400)
RBC: 2.83 MIL/uL — ABNORMAL LOW (ref 3.87–5.11)
RDW: 13.6 % (ref 11.5–15.5)
Smear Review: NORMAL
WBC: 58.8 10*3/uL (ref 4.0–10.5)
nRBC: 0 % (ref 0.0–0.2)

## 2023-06-18 LAB — BRAIN NATRIURETIC PEPTIDE: B Natriuretic Peptide: 1254.1 pg/mL — ABNORMAL HIGH (ref 0.0–100.0)

## 2023-06-18 LAB — MAGNESIUM: Magnesium: 2.2 mg/dL (ref 1.7–2.4)

## 2023-06-18 MED ORDER — FUROSEMIDE 10 MG/ML IJ SOLN
40.0000 mg | Freq: Once | INTRAMUSCULAR | Status: AC
  Administered 2023-06-18: 40 mg via INTRAVENOUS
  Filled 2023-06-18: qty 4

## 2023-06-18 MED ORDER — DM-GUAIFENESIN ER 30-600 MG PO TB12: 1.0000 | ORAL_TABLET | Freq: Two times a day (BID) | ORAL | Status: DC | PRN

## 2023-06-18 MED ORDER — ASPIRIN 81 MG PO TBEC
81.0000 mg | DELAYED_RELEASE_TABLET | Freq: Every day | ORAL | Status: DC
  Administered 2023-06-18 – 2023-06-19 (×2): 81 mg via ORAL
  Filled 2023-06-18 (×2): qty 1

## 2023-06-18 MED ORDER — VITAMIN B-12 1000 MCG PO TABS
1000.0000 ug | ORAL_TABLET | Freq: Every day | ORAL | Status: DC
  Administered 2023-06-18 – 2023-06-19 (×2): 1000 ug via ORAL
  Filled 2023-06-18: qty 2
  Filled 2023-06-18: qty 1

## 2023-06-18 MED ORDER — AMITRIPTYLINE HCL 10 MG PO TABS
20.0000 mg | ORAL_TABLET | Freq: Every day | ORAL | Status: DC
  Administered 2023-06-18: 20 mg via ORAL
  Filled 2023-06-18: qty 2

## 2023-06-18 MED ORDER — ATORVASTATIN CALCIUM 20 MG PO TABS
20.0000 mg | ORAL_TABLET | Freq: Every evening | ORAL | Status: DC
  Administered 2023-06-18: 20 mg via ORAL
  Filled 2023-06-18: qty 1

## 2023-06-18 MED ORDER — SACUBITRIL-VALSARTAN 24-26 MG PO TABS
1.0000 | ORAL_TABLET | Freq: Two times a day (BID) | ORAL | Status: DC
  Administered 2023-06-18 – 2023-06-19 (×3): 1 via ORAL
  Filled 2023-06-18 (×3): qty 1

## 2023-06-18 MED ORDER — ENSURE ENLIVE PO LIQD
237.0000 mL | Freq: Two times a day (BID) | ORAL | Status: DC
  Administered 2023-06-19: 237 mL via ORAL

## 2023-06-18 MED ORDER — ENOXAPARIN SODIUM 30 MG/0.3ML IJ SOSY
30.0000 mg | PREFILLED_SYRINGE | INTRAMUSCULAR | Status: DC
  Administered 2023-06-18 – 2023-06-19 (×2): 30 mg via SUBCUTANEOUS
  Filled 2023-06-18 (×2): qty 0.3

## 2023-06-18 MED ORDER — ACETAMINOPHEN 650 MG RE SUPP: 650.0000 mg | Freq: Four times a day (QID) | RECTAL | Status: DC | PRN

## 2023-06-18 MED ORDER — FUROSEMIDE 10 MG/ML IJ SOLN
40.0000 mg | Freq: Two times a day (BID) | INTRAMUSCULAR | Status: DC
  Administered 2023-06-18 – 2023-06-19 (×2): 40 mg via INTRAVENOUS
  Filled 2023-06-18 (×2): qty 4

## 2023-06-18 MED ORDER — DIPHENHYDRAMINE HCL 50 MG/ML IJ SOLN: 12.5000 mg | Freq: Three times a day (TID) | INTRAMUSCULAR | Status: DC | PRN

## 2023-06-18 MED ORDER — ALBUTEROL SULFATE (2.5 MG/3ML) 0.083% IN NEBU: 2.5000 mg | INHALATION_SOLUTION | RESPIRATORY_TRACT | Status: DC | PRN

## 2023-06-18 MED ORDER — HYDROCODONE-ACETAMINOPHEN 10-325 MG PO TABS: 1.0000 | ORAL_TABLET | Freq: Four times a day (QID) | ORAL | Status: DC | PRN

## 2023-06-18 MED ORDER — HYDRALAZINE HCL 20 MG/ML IJ SOLN: 5.0000 mg | INTRAMUSCULAR | Status: DC | PRN

## 2023-06-18 MED ORDER — DULOXETINE HCL 20 MG PO CPEP
20.0000 mg | ORAL_CAPSULE | Freq: Every day | ORAL | Status: DC
  Administered 2023-06-18 – 2023-06-19 (×2): 20 mg via ORAL
  Filled 2023-06-18 (×2): qty 1

## 2023-06-18 NOTE — ED Notes (Signed)
Pt placed back on oxygen for comfort, but not requiring it. Pt currently wearing 2L [place]. WCTM.

## 2023-06-18 NOTE — ED Notes (Signed)
Critical Result: trop 116  Clyde Lundborg, MD aware

## 2023-06-18 NOTE — ED Notes (Signed)
Report received, this RN now assuming care.  

## 2023-06-18 NOTE — ED Notes (Signed)
Critical Result: WBC 58.8  Modesto Charon, MD made aware.

## 2023-06-18 NOTE — ED Notes (Signed)
Patient's daughter update at this time- okayed by patient.

## 2023-06-18 NOTE — ED Provider Notes (Signed)
[facility] Provider Note    Event Date/Time   First MD Initiated Contact with Patient 06/18/23 [phone]     (approximate)   History   Shortness of Breath   HPI  Lindsay [name] is a 89 y.o. female   Past medical history of CHF, CLL, arthritis, fibromyalgia, GERD and hypertension who presents emergency department with shortness of breath.  She has had mild but progressive shortness of breath over the last couple of days but denies respiratory infectious symptoms like cough or fever, then this morning awoke from sleep feeling shortness of breath and chest discomfort.  She sat up in bed and this nearly completely resolved.  She feels well now.  She has no chest pain currently.  She denies GI or GU complaints.  She does not wear home oxygen.   External Medical Documents Reviewed: Hospitalization notes from 2023 where she was admitted for CHF exacerbation and responded well to diuresis      Physical Exam   Triage Vital Signs: ED Triage Vitals  Enc Vitals Group     BP 06/18/23 0622 (!) 173/48     Pulse Rate 06/18/23 0622 90     Resp 06/18/23 0622 (!) 30     Temp 06/18/23 0622 98.1 F (36.7 C)     Temp Source 06/18/23 0622 Oral     SpO2 06/18/23 0622 100 %     Weight 06/18/23 0623 107 lb (48.5 kg)     Height 06/18/23 0623 5' (1.524 m)     Head Circumference --      Peak Flow --      Pain Score --      Pain Loc --      Pain Edu? --      Excl. in GC? --     Most recent vital signs: Vitals:   06/18/23 0700 06/18/23 0730  BP: (!) 158/48 (!) 154/47  Pulse: 82 81  Resp: 17 20  Temp:    SpO2: 100% 100%    General: Awake, no distress.  CV:  Good peripheral perfusion.  Resp:  Normal effort.  Abd:  No distention.  Other:  No significant peripheral edema, no focality on lung exam or wheezing, she is tachypneic, oxygen saturations at 95+ on room air soft nontender abdomen.   ED Results / Procedures / Treatments   Labs (all labs ordered are  listed, but only abnormal results are displayed) Labs Reviewed  COMPREHENSIVE METABOLIC PANEL - Abnormal; Notable for the following components:      Result Value   Chloride 113 (*)    CO2 18 (*)    Glucose, Bld 116 (*)    BUN 32 (*)    Creatinine, Ser 1.33 (*)    Calcium 8.4 (*)    Total Protein 5.6 (*)    GFR, Estimated 39 (*)    All other components within normal limits  TROPONIN I (HIGH SENSITIVITY) - Abnormal; Notable for the following components:   Troponin I (High Sensitivity) 80 (*)    All other components within normal limits  CBC WITH DIFFERENTIAL/PLATELET  BRAIN NATRIURETIC PEPTIDE     I ordered and reviewed the above labs they are notable for initial troponin is 80, near baseline.  EKG  ED ECG REPORT I, Pilar Jarvis, the attending physician, personally viewed and interpreted this ECG.   Date: 06/18/2023  EKG Time: 0635  Rate: 85  Rhythm: Sinus  Axis: nl  Intervals: LBBB  ST&T Change: no stemi  RADIOLOGY I independently reviewed and interpreted chest x-ray and see diffuse opacities consistent with pulmonary edema   PROCEDURES:  Critical Care performed: No  Procedures   MEDICATIONS ORDERED IN ED: Medications  furosemide (LASIX) injection 40 mg (40 mg Intravenous Given 06/18/23 0726)    External physician / consultants:  I spoke with hospitalist for admission and regarding care plan for this patient.   IMPRESSION / MDM / ASSESSMENT AND PLAN / ED COURSE  I reviewed the triage vital signs and the nursing notes.                                Patient's presentation is most consistent with acute presentation with potential threat to life or bodily function.  Differential diagnosis includes, but is not limited to, CHF exacerbation, ACS, PE, dissection, respiratory infection or bacterial pneumonia   The patient is on the cardiac monitor to evaluate for evidence of arrhythmia and/or significant heart rate changes.  MDM: This patient with CHF with  a few days of worsening shortness of breath on exertion, orthopnea, chest discomfort transient this morning.  No new oxygen requirement but appears to be experiencing CHF exacerbation with a history of the same with pulmonary edema on chest x-ray.  Initial diuresis ordered in the emergency department she looks well except for tachypnea.  Admit for diuresis.  Considered ACS but doubt given the transient nature of her chest pain, nonischemic EKG and initial troponin is at baseline, will trend serial troponin.        FINAL CLINICAL IMPRESSION(S) / ED DIAGNOSES   Final diagnoses:  SOB (shortness of breath)  Acute on chronic congestive heart failure, unspecified heart failure type (HCC)     Rx / DC Orders   ED Discharge Orders     None        Note:  This document was prepared using Dragon voice recognition software and may include unintentional dictation errors.    Pilar Jarvis, MD 06/18/23 [phone]

## 2023-06-18 NOTE — H&P (Addendum)
History and Physical    Lindsay [name]:[id number] DOB: 1934/08/22 DOA: 06/18/2023  Referring MD/NP/PA:   PCP: Jerl Mina, MD   Patient coming from:  The patient is coming from home.     Chief Complaint: SOB  HPI: Lindsay [name] is a 89 y.o. female with medical history significant of sCHF with EF 45-50%, HTN, left bundle blockade, RLS, kidney stone, IBS, depression, CKD-3a, CLL not on chemotherapy currently, who presents with shortness of breath.  Patient states that she has shortness of breath in the past several days, which has been progressively worsening.  Patient has cough with little mucus production.  No chest pain, fever or chills.  Patient has orthopnea . Her shortness of breath is worse when laying flat.  No nausea, vomiting, diarrhea or abdominal pain.  No symptoms of UTI.  No active bleeding.  Data reviewed independently and ED Course: pt was found to have BNP 1254, troponin 80--> 116,  WBC 58.8 (79.9 on 05/07/2023), stable hemoglobin, stable renal function, temperature normal, blood pressure 165/48, heart rate 90, RR 30 --> 19, patient does not have oxygen desaturation per ED physician, started 2 L oxygen for comfort reason.  Chest x-ray showed cardiomegaly and interstitial edema.  Patient is placed on telemetry bed for observation.   EKG: I have personally reviewed.  Sinus rhythm, QTc 501, LAD, left bundle blockade, poor R wave progression, anteroseptal infarction pattern, T wave inversion in V5-V6.   Review of Systems:   General: no fevers, chills, no body weight gain, has fatigue HEENT: no blurry vision, hearing changes or sore throat Respiratory: has dyspnea, coughing, no wheezing CV: no chest pain, no palpitations GI: no nausea, vomiting, abdominal pain, diarrhea, constipation GU: no dysuria, burning on urination, increased urinary frequency, hematuria  Ext: has trace leg edema Neuro: no unilateral weakness, numbness, or tingling, no vision change or  hearing loss Skin: no rash, no skin tear. MSK: No muscle spasm, no deformity, no limitation of range of movement in spin Heme: No easy bruising.  Travel history: No recent long distant travel.   Allergy:  Allergies  Allergen Reactions   Gabapentin Swelling    05/01/23: Pt stated she has facial swelling when taking Gabapentin.   Demerol [Meperidine]     Past Medical History:  Diagnosis Date   Arthritis    RHEUMATOID   CHF (congestive heart failure) (HCC)    CLL (chronic lymphocytic leukemia) (HCC)    Edema    MILD ANKLE OCCAS   Fibromyalgia    GERD (gastroesophageal reflux disease)    Hypertension    Kidney stones    RLS (restless legs syndrome)     Past Surgical History:  Procedure Laterality Date   ABDOMINAL HYSTERECTOMY     CATARACT EXTRACTION W/PHACO Left 02/07/2016   Procedure: CATARACT EXTRACTION PHACO AND INTRAOCULAR LENS PLACEMENT (IOC);  Surgeon: Sallee Lange, MD;  Location: [facility] ORS;  Service: Ophthalmology;  Laterality: Left;  [place] 01:33 AP% 24.7 CDE 39.57 fluid pack lot # [id number]   EXTRACORPOREAL SHOCK WAVE LITHOTRIPSY Right 07/03/2019   Procedure: EXTRACORPOREAL SHOCK WAVE LITHOTRIPSY (ESWL);  Surgeon: Orson Ape, MD;  Location: [facility] ORS;  Service: Urology;  Laterality: Right;   EYE SURGERY     HIP ARTHROPLASTY Left 04/20/2020   Procedure: ARTHROPLASTY BIPOLAR HIP (HEMIARTHROPLASTY);  Surgeon: Kennedy Bucker, MD;  Location: [facility] ORS;  Service: Orthopedics;  Laterality: Left;   LITHOTRIPSY     TONSILLECTOMY      Social History:  reports that she  has never smoked. She has never used smokeless tobacco. She reports that she does not drink alcohol and does not use drugs.  Family History:  Family History  Problem Relation Age of Onset   Cancer Mother        uterine cancer     Prior to Admission medications   Medication Sig Start Date End Date Taking? Authorizing Provider  amitriptyline (ELAVIL) 10 MG tablet Take 20 mg by mouth at bedtime.     [provider]  cyanocobalamin (,VITAMIN B-12,) 1000 MCG/ML injection Inject into the muscle every 14 (fourteen) days. 07/04/21   [provider]  cyanocobalamin (VITAMIN B12) 1000 MCG tablet Take 1,000 mcg by mouth daily.    [provider]  DULoxetine (CYMBALTA) 20 MG capsule Take 20 mg by mouth daily. 04/09/23 04/08/24  [provider]  feeding supplement, ENSURE ENLIVE, (ENSURE ENLIVE) LIQD Take 237 mLs by mouth 2 (two) times daily between meals. 04/22/20   Swayze, Ava, DO  furosemide (LASIX) 20 MG tablet Take 1 tablet (20 mg total) by mouth daily. Patient taking differently: Take 40 mg by mouth daily. 05/16/22 05/23/23  Wouk, Wilfred Curtis, MD  HYDROcodone-acetaminophen (NORCO) 10-325 MG tablet Take 1 tablet by mouth every 6 (six) hours as needed.    [provider]  sacubitril-valsartan (ENTRESTO) 24-26 MG Take 1 tablet by mouth 2 (two) times daily.    [provider]  sodium zirconium cyclosilicate (LOKELMA) 5 g packet Take 10 g by mouth 3 (three) times daily. Patient not taking: Reported on 05/23/2023 05/03/23   Malachy Moan, NP    Physical Exam: Vitals:   06/18/23 0830 06/18/23 0930 06/18/23 1011 06/18/23 1030  BP: (!) 165/48 (!) 165/92  (!) 155/57  Pulse: 82 84  79  Resp: 19 20  (!) 22  Temp:   98.7 F (37.1 C)   TempSrc:   Oral   SpO2: 100% 94%  100%  Weight:      Height:       General: Not in acute distress HEENT:       Eyes: PERRL, EOMI, no jaundice       ENT: No discharge from the ears and nose, no pharynx injection, no tonsillar enlargement.        Neck: positive JVD, no bruit, no mass felt. Heme: has neck lymph node enlargement. Cardiac: S1/S2, RRR, No murmurs, No gallops or rubs. Respiratory: Has fine crackles bilaterally GI: Soft, nondistended, nontender, no rebound pain, no organomegaly, BS present. GU: No hematuria Ext: Trace leg edema bilaterally. 1+DP/PT pulse bilaterally. Musculoskeletal: No joint deformities,  No joint redness or warmth, no limitation of ROM in spin. Skin: No rashes.  Neuro: Alert, oriented X3, cranial nerves II-XII grossly intact, moves all extremities normally Psych: Patient is not psychotic, no suicidal or hemocidal ideation.  Labs on Admission: I have personally reviewed following labs and imaging studies  CBC: Recent Labs  Lab 06/18/23 0631  WBC 58.8*  NEUTROABS 4.1  HGB 8.8*  HCT 30.0*  MCV 106.0*  PLT 141*   Basic Metabolic Panel: Recent Labs  Lab 06/18/23 0631  NA 141  K 3.9  CL 113*  CO2 18*  GLUCOSE 116*  BUN 32*  CREATININE 1.33*  CALCIUM 8.4*  MG 2.2   GFR: Estimated Creatinine Clearance: 21.4 mL/min (A) (by C-G formula based on SCr of 1.33 mg/dL (H)). Liver Function Tests: Recent Labs  Lab 06/18/23 0631  AST 16  ALT 10  ALKPHOS 84  BILITOT 0.7  PROT 5.6*  ALBUMIN 3.8   No results for input(s): "LIPASE", "AMYLASE" in the last 168 hours. No results for input(s): "AMMONIA" in the last 168 hours. Coagulation Profile: No results for input(s): "INR", "PROTIME" in the last 168 hours. Cardiac Enzymes: No results for input(s): "CKTOTAL", "CKMB", "CKMBINDEX", "TROPONINI" in the last 168 hours. BNP (last 3 results) No results for input(s): "PROBNP" in the last 8760 hours. HbA1C: No results for input(s): "HGBA1C" in the last 72 hours. CBG: No results for input(s): "GLUCAP" in the last 168 hours. Lipid Profile: No results for input(s): "CHOL", "HDL", "LDLCALC", "TRIG", "CHOLHDL", "LDLDIRECT" in the last 72 hours. Thyroid Function Tests: No results for input(s): "TSH", "T4TOTAL", "FREET4", "T3FREE", "THYROIDAB" in the last 72 hours. Anemia Panel: No results for input(s): "VITAMINB12", "FOLATE", "FERRITIN", "TIBC", "IRON", "RETICCTPCT" in the last 72 hours. Urine analysis:    Component Value Date/Time   COLORURINE STRAW (A) 07/16/2022 2316   APPEARANCEUR CLEAR (A) 07/16/2022 2316   APPEARANCEUR Clear 03/06/2014 2053   LABSPEC 1.008  07/16/2022 2316   LABSPEC 1.008 03/06/2014 2053   PHURINE 5.0 07/16/2022 2316   GLUCOSEU NEGATIVE 07/16/2022 2316   GLUCOSEU Negative 03/06/2014 2053   HGBUR NEGATIVE 07/16/2022 2316   BILIRUBINUR NEGATIVE 07/16/2022 2316   BILIRUBINUR Negative 03/06/2014 2053   KETONESUR NEGATIVE 07/16/2022 2316   PROTEINUR NEGATIVE 07/16/2022 2316   NITRITE NEGATIVE 07/16/2022 2316   LEUKOCYTESUR NEGATIVE 07/16/2022 2316   LEUKOCYTESUR Negative 03/06/2014 2053   Sepsis Labs: @LABRCNTIP (procalcitonin:4,lacticidven:4) )No results found for this or any previous visit (from the past 240 hour(s)).   Radiological Exams on Admission: DG Chest Portable 1 View  Result Date: 06/18/2023 CLINICAL DATA:  89 year old female with cough and shortness of breath. History of CLL. EXAM: PORTABLE CHEST 1 VIEW COMPARISON:  CTA chest 07/16/2022 and earlier. FINDINGS: Portable AP semi upright view at 0646 hours. Large lung Visualized tracheal air column is within normal limits. No pneumothorax or pleural effusion. No consolidation. Increased pulmonary interstitial opacity not significantly changed. Paucity of bowel gas in the upper abdomen. No acute osseous abnormality identified. Volumes and mild cardiomegaly are stable from last year. IMPRESSION: Chronic pulmonary hyperinflation and mild cardiomegaly. Diffuse increased pulmonary interstitium does not appear significantly changed from last year. Main differential considerations include recurrent interstitial edema and chronic interstitial lung disease. Electronically Signed   By: Odessa Fleming M.D.   On: 06/18/2023 07:05      Assessment/Plan Principal Problem:   Acute on chronic combined systolic and diastolic congestive heart failure (HCC) Active Problems:   Myocardial injury   CLL (chronic lymphocytic leukemia) (HCC)   Macrocytic anemia   Essential hypertension   Chronic kidney disease, stage 3a (HCC)   Depression   Protein-calorie malnutrition, moderate  (HCC)   Assessment and Plan:  Acute on chronic combined systolic and diastolic congestive heart failure (HCC): 2D echo on 05/30/2022 showed EF of 45-50% with grade 1 diastolic dysfunction.  Patient has short of breath, elevated BNP 1254, trace leg edema, positive JVD, crackles on auscultation, interstitial edema on chest x-ray, clinically consistent with CHF exacerbation.  -Place in tele bed for observation -Lasix 40 mg bid by IV -continue entresto -2d echo -Daily weights -strict I/O's -Low salt diet -Fluid restriction -As needed bronchodilators for shortness of breath  Myocardial injury: trop  80 --> 116, no chest pain -start ASA 81 mg daily and lipitor 20 mg daily -trend trop -check A1c and FLP -f/u 2d echo  CLL (chronic lymphocytic leukemia) (HCC): pt is not on  chemotherapy currently.  WBC is trending down from a 79.7 on 5/20/202 to  58.8 today -Following up with Dr. Donneta Romberg of hematology  Macrocytic anemia: Hemoglobin stable 8.8 (7.7 on 05/07/2023) -Continue vitamin B12  Essential hypertension -IV hydralazine as needed -On Entrensto  Chronic kidney disease, stage 3a (HCC): stable.  Recent baseline creatinine 1.2-1.5.  Her creatinine is 1.33, BUN 32, GFR 39 -Follow-up BMP  Depression -Continue home occasions: Cymbalta, amitriptyline  Protein-calorie malnutrition, moderate (HCC): Body weight 48.5 kg, BMI 20.90 -Ensure nutrition supplement      DVT ppx: SQ Lovenox  Code Status: DNR per pt  Family Communication:   Yes, patient's relative   at bed side.    Disposition Plan:  Anticipate discharge back to previous environment  Consults called:  none  Admission status and Level of care: Telemetry Cardiac:    for obs    Dispo: The patient is from: Home              Anticipated d/c is to: Home              Anticipated d/c date is: 1 day              Patient currently is not medically stable to d/c.    Severity of Illness:  The appropriate patient status  for this patient is OBSERVATION. Observation status is judged to be reasonable and necessary in order to provide the required intensity of service to ensure the patient's safety. The patient's presenting symptoms, physical exam findings, and initial radiographic and laboratory data in the context of their medical condition is felt to place them at decreased risk for further clinical deterioration. Furthermore, it is anticipated that the patient will be medically stable for discharge from the hospital within 2 midnights of admission.        Date of Service 06/18/2023    Lorretta Harp [facility]   If 7PM-7AM, please contact night-coverage [email or web address] 06/18/2023, 10:36 AM

## 2023-06-18 NOTE — ED Notes (Signed)
Pt removed from oxygen at this time and tolerating well. WCTM.

## 2023-06-18 NOTE — ED Notes (Signed)
Patient assisted to restroom at this time. 

## 2023-06-18 NOTE — ED Notes (Signed)
Niu, MD at bedside. °

## 2023-06-18 NOTE — TOC Initial Note (Signed)
Transition of Care [facility]) - Initial/Assessment Note    Patient Details  Name: Lindsay [name] MRN: [id number] Date of Birth: Oct 04, 1934  [facility] CM/SW Contact:    Kreg Shropshire, RN Phone Number: 06/18/2023, 3:54 PM  Clinical Narrative:                 Cm assessed pt for toc needs. She stated she lived with adult daughter. She plans on returning home with adult daughter. She used home health in the past but declined home health PT/OT. She stated she got around fine without any use of equipment or need for therapy. She has a PCP. Pt noted to be on oxygen in room. She stated she does not wear oxygen at home. TOC will continue to follow for additional needs.  Expected Discharge Plan: Home/Self Care Barriers to Discharge: Continued Medical Work up   Patient Goals and CMS Choice            Expected Discharge Plan and Services       Living arrangements for the past 2 months: Single Family Home                                      Prior Living Arrangements/Services Living arrangements for the past 2 months: Single Family Home Lives with:: Self, Adult Children                   Activities of Daily Living Home Assistive Devices/Equipment: Environmental consultant (specify type), Eyeglasses ADL Screening (condition at time of admission) Patient's cognitive ability adequate to safely complete daily activities?: Yes Is the patient deaf or have difficulty hearing?: No Does the patient have difficulty seeing, even when wearing glasses/contacts?: No Does the patient have difficulty concentrating, remembering, or making decisions?: No Patient able to express need for assistance with ADLs?: Yes Does the patient have difficulty dressing or bathing?: No Independently performs ADLs?: Yes (appropriate for developmental age) Does the patient have difficulty walking or climbing stairs?: Yes Weakness of Legs: Both Weakness of Arms/Hands: None  Permission Sought/Granted                   Emotional Assessment Appearance:: Appears stated age Attitude/Demeanor/Rapport: Gracious Affect (typically observed): Calm Orientation: : Oriented to Self, Oriented to Place, Oriented to  Time, Oriented to Situation      Admission diagnosis:  Acute on chronic combined systolic and diastolic congestive heart failure (HCC) [I50.43] Patient Active Problem List   Diagnosis Date Noted   Acute on chronic combined systolic and diastolic congestive heart failure (HCC) 06/18/2023   Macrocytic anemia 06/18/2023   Protein-calorie malnutrition, moderate (HCC) 06/18/2023   Depression 06/18/2023   Myocardial injury 06/18/2023   Dehydration 05/03/2023   Chronic kidney disease, stage 3a (HCC) 02/11/2023   Acute on chronic systolic CHF (congestive heart failure) (HCC) 07/17/2022   Acute respiratory failure with hypoxia (HCC) 07/17/2022   Essential hypertension 07/17/2022   Vitamin B12 deficiency 07/17/2022   Elevated d-dimer 07/17/2022   Kidney stone 05/16/2022   CHF exacerbation (HCC) 05/16/2022   Anemia    Lymphocytosis    Elevated troponin    Respiratory tract infection    Recurrent UTI    Iron deficiency 07/04/2021   CLL (chronic lymphocytic leukemia) (HCC) 02/14/2021   Hypertension 04/19/2020   Accidental fall 04/19/2020   Fracture of femoral neck, left, closed (HCC) 04/19/2020   Preoperative clearance  04/19/2020   Closed left hip fracture, initial encounter (HCC) 04/19/2020   Left bundle branch block 04/19/2020   Leukemia (HCC) 04/19/2020   Arthritis 03/10/2020   Calculus of kidney 03/10/2020   Diverticulosis 03/10/2020   Fibromyalgia 03/10/2020   IBS (irritable bowel syndrome) 03/10/2020   PCP:  Jerl Mina, MD Pharmacy:   [facility] - [place], [place] - [address] [place] [address] Phone: [phone] Fax: [phone]  [id number] - [address] [place]  [address] Phone: [phone] Fax: [phone]     Social Determinants of Health (SDOH) Social History: SDOH Screenings   Food Insecurity: No Food Insecurity (06/18/2023)  Housing: Low Risk  (06/18/2023)  Transportation Needs: No Transportation Needs (06/18/2023)  Utilities: Not At Risk (06/18/2023)  Depression (PHQ2-9): Low Risk  (08/01/2022)  Tobacco Use: Low Risk  (06/18/2023)   SDOH Interventions:     Readmission Risk Interventions     No data to display

## 2023-06-19 ENCOUNTER — Observation Stay (HOSPITAL_BASED_OUTPATIENT_CLINIC_OR_DEPARTMENT_OTHER)
Admit: 2023-06-19 | Discharge: 2023-06-19 | Disposition: A | Discharge status: Home / Self Care | Payer: Medicare Other | Attending: Internal Medicine | Admitting: Internal Medicine

## 2023-06-19 DIAGNOSIS — I5043 Acute on chronic combined systolic (congestive) and diastolic (congestive) heart failure: Secondary | ICD-10-CM | POA: Diagnosis not present

## 2023-06-19 DIAGNOSIS — I5021 Acute systolic (congestive) heart failure: Secondary | ICD-10-CM | POA: Diagnosis not present

## 2023-06-19 LAB — ECHOCARDIOGRAM COMPLETE
AR max vel: 1.01 cm2
AV Area VTI: 1 cm2
AV Area mean vel: 1.01 cm2
AV Mean grad: 13 mmHg
AV Peak grad: 21.6 mmHg
Ao pk vel: 2.32 m/s
Area-P 1/2: 2.49 cm2
Calc EF: 40 %
Height: 60 in
MV VTI: 1.25 cm2
P 1/2 time: 396 msec
S' Lateral: 3.7 cm
Single Plane A2C EF: 44.4 %
Single Plane A4C EF: 37.1 %
Weight: 1516.76 oz

## 2023-06-19 LAB — BASIC METABOLIC PANEL
Anion gap: 11 (ref 5–15)
BUN: 40 mg/dL — ABNORMAL HIGH (ref 8–23)
CO2: 19 mmol/L — ABNORMAL LOW (ref 22–32)
Calcium: 8.3 mg/dL — ABNORMAL LOW (ref 8.9–10.3)
Chloride: 110 mmol/L (ref 98–111)
Creatinine, Ser: 1.54 mg/dL — ABNORMAL HIGH (ref 0.44–1.00)
GFR, Estimated: 32 mL/min — ABNORMAL LOW (ref >60)
Glucose, Bld: 87 mg/dL (ref 70–99)
Potassium: 3.7 mmol/L (ref 3.5–5.1)
Sodium: 140 mmol/L (ref 135–145)

## 2023-06-19 LAB — LIPID PANEL
Cholesterol: 137 mg/dL (ref 0–200)
HDL: 36 mg/dL — ABNORMAL LOW (ref >40)
LDL Cholesterol: 90 mg/dL (ref 0–99)
Total CHOL/HDL Ratio: 3.8 RATIO
Triglycerides: 53 mg/dL (ref <150)
VLDL: 11 mg/dL (ref 0–40)

## 2023-06-19 LAB — HEMOGLOBIN A1C
Hgb A1c MFr Bld: 5.5 % (ref 4.8–5.6)
Mean Plasma Glucose: 111 mg/dL

## 2023-06-19 MED ORDER — FUROSEMIDE 20 MG PO TABS: 40.0000 mg | ORAL_TABLET | Freq: Every day | ORAL | 0 refills | Status: DC

## 2023-06-19 MED ORDER — FUROSEMIDE 10 MG/ML IJ SOLN: 40.0000 mg | INTRAMUSCULAR | Status: DC

## 2023-06-19 MED ORDER — ASPIRIN 81 MG PO TBEC: 81.0000 mg | DELAYED_RELEASE_TABLET | Freq: Every day | ORAL | 12 refills | Status: DC

## 2023-06-19 NOTE — Plan of Care (Signed)

## 2023-06-19 NOTE — Discharge Summary (Signed)
Physician Discharge Summary   Patient: Lindsay [name] MRN: [id number] DOB: 06/19/1934  Admit date:     06/18/2023  Discharge date: 06/19/23  Discharge Physician: Enedina Finner   PCP: Jerl Mina, MD   Recommendations at discharge:   patient follow-up with CHF clinic at [facility] 1 to 2 weeks. PCP in one week  Discharge Diagnoses: Principal Problem:   Acute on chronic combined systolic and diastolic congestive heart failure (HCC) Active Problems:   Myocardial injury   CLL (chronic lymphocytic leukemia) (HCC)   Macrocytic anemia   Essential hypertension   Chronic kidney disease, stage 3a (HCC)   Depression   Protein-calorie malnutrition, moderate (HCC)   Lindsay [name] is a 89 y.o. female with medical history significant of sCHF with EF 45-50%, HTN, left bundle blockade, RLS, kidney stone, IBS, depression, CKD-3a, CLL not on chemotherapy currently, who presents with shortness of breath.   Patient states that she has shortness of breath in the past several days, which has been progressively worsening.   Chest x-ray showed cardiomegaly and interstitial edema   pt was found to have BNP 1254, troponin 80--> 116,   Acute on chronic combined systolic and diastolic congestive heart failure (HCC): 2D echo on 05/30/2022 showed EF of 45-50% with grade 1 diastolic dysfunction.  Patient has short of breath, elevated BNP 1254, trace leg edema, positive JVD, crackles on auscultation, interstitial edema on chest x-ray, clinically consistent with CHF exacerbation.  -Lasix 40 mg bid by IV--UOP not documented however patient feels a whole lot better. She says she made several trips to the bathroom to urinate. -continue entresto -oxygen saturation more than 92% on room air. -- Discussed with patient and daughter regarding taking extra 20 mg on the day patient notices weight gain more than 5 pounds or increasing shortness of breath. She is advised to follow-up with CHF clinic at [facility] and primary care  physician  Myocardial injury: trop  80 --> 116, no chest pain -start ASA 81 mg daily and lipitor 20 mg daily -remains chest pain free.  CLL (chronic lymphocytic leukemia) (HCC): pt is not on chemotherapy currently.  WBC is trending down from a 79.7 on 5/20/202 to  58.8 today -Following up with Dr. Donneta Romberg of hematology   Macrocytic anemia: Hemoglobin stable 8.8 (7.7 on 05/07/2023) -Continue vitamin B12   Essential hypertension -IV hydralazine as needed -On Entrensto   Chronic kidney disease, stage 3a (HCC): stable.  Recent baseline creatinine 1.2-1.5.  Her creatinine is 1.33, BUN 32, GFR 39 -Follow-up BMP   Depression -Continue home occasions: Cymbalta, amitriptyline   Protein-calorie malnutrition, moderate (HCC): Body weight 48.5 kg, BMI 20.90 -Ensure nutrition supplement   overall hemodynamically stable. Patient is eager to go home. Discharge plan discussed with patient's daughter on the phone she is in agreement with the plan.       DVT ppx: SQ Lovenox   Code Status: DNR per pt   Family Communication:   Yes, patient's daughter on the phone     Disposition: Home Diet recommendation:  Discharge Diet Orders (From admission, onward)     Start     Ordered   06/19/23 0000  Diet - low sodium heart healthy        06/19/23 1200           Cardiac diet DISCHARGE MEDICATION: Allergies as of 06/19/2023       Reactions   Gabapentin Swelling   05/01/23: Pt stated she has facial swelling when taking Gabapentin.  Demerol [meperidine]         Medication List     STOP taking these medications    Lokelma 5 g packet Generic drug: sodium zirconium cyclosilicate       TAKE these medications    amitriptyline 10 MG tablet Commonly known as: ELAVIL Take 20 mg by mouth at bedtime.   aspirin EC 81 MG tablet Take 1 tablet (81 mg total) by mouth daily. Swallow whole. Start taking on: June 20, 2023   cyanocobalamin 1000 MCG tablet Commonly known as: VITAMIN  B12 Take 1,000 mcg by mouth daily.   cyanocobalamin 1000 MCG/ML injection Commonly known as: VITAMIN B12 Inject into the muscle every 14 (fourteen) days.   DULoxetine 20 MG capsule Commonly known as: CYMBALTA Take 20 mg by mouth daily.   Entresto 24-26 MG Generic drug: sacubitril-valsartan Take 1 tablet by mouth 2 (two) times daily.   feeding supplement Liqd Take 237 mLs by mouth 2 (two) times daily between meals.   furosemide 20 MG tablet Commonly known as: Lasix Take 2 tablets (40 mg total) by mouth daily. Notes to patient: Take extra 20 mg pill on the day you feel SOB from baseline or increase in weight >5 lbs   HYDROcodone-acetaminophen 10-325 MG tablet Commonly known as: NORCO Take 1 tablet by mouth every 6 (six) hours as needed.        Follow-up Information     Jerl Mina, MD. Schedule an appointment as soon as possible for a visit in 1 week(s).   Specialty: Family Medicine Contact information: [address] [place] [address] [phone]         [facility]. Schedule an appointment as soon as possible for a visit in 1 week(s).   Specialty: Cardiology Why: CHF Contact information: [address] [place] [address] [phone]               Discharge Exam: Ceasar Mons Weights   06/18/23 0623 06/19/23 0500  Weight: 48.5 kg 43 kg  alert and oriented times three cardiovascular both heart sounds normal rate rhythm regular. No murmur respiratory clear to auscultation no Rolitta rhonchi respiratory distress neuro- grossly nonfocal  Condition at discharge: fair  The results of significant diagnostics from this hospitalization (including imaging, microbiology, ancillary and laboratory) are listed below for reference.   Imaging Studies: DG Chest Portable 1 View  Result Date: 06/18/2023 CLINICAL DATA:  89 year old female with cough and shortness of  breath. History of CLL. EXAM: PORTABLE CHEST 1 VIEW COMPARISON:  CTA chest 07/16/2022 and earlier. FINDINGS: Portable AP semi upright view at 0646 hours. Large lung Visualized tracheal air column is within normal limits. No pneumothorax or pleural effusion. No consolidation. Increased pulmonary interstitial opacity not significantly changed. Paucity of bowel gas in the upper abdomen. No acute osseous abnormality identified. Volumes and mild cardiomegaly are stable from last year. IMPRESSION: Chronic pulmonary hyperinflation and mild cardiomegaly. Diffuse increased pulmonary interstitium does not appear significantly changed from last year. Main differential considerations include recurrent interstitial edema and chronic interstitial lung disease. Electronically Signed   By: Odessa Fleming M.D.   On: 06/18/2023 07:05    Microbiology: Results for orders placed or performed during the hospital encounter of 07/16/22  SARS Coronavirus 2 by RT PCR (hospital order, performed in [facility] hospital lab) *cepheid single result test* Anterior Nasal Swab     Status: None   Collection Time: 07/16/22  8:13 PM  Specimen: Anterior Nasal Swab  Result Value Ref Range Status   SARS Coronavirus 2 by RT PCR NEGATIVE NEGATIVE Final    Comment: (NOTE) SARS-CoV-2 target nucleic acids are NOT DETECTED.  The SARS-CoV-2 RNA is generally detectable in upper and lower respiratory specimens during the acute phase of infection. The lowest concentration of SARS-CoV-2 viral copies this assay can detect is 250 copies / mL. A negative result does not preclude SARS-CoV-2 infection and should not be used as the sole basis for treatment or other patient management decisions.  A negative result may occur with improper specimen collection / handling, submission of specimen other than nasopharyngeal swab, presence of viral mutation(s) within the areas targeted by this assay, and inadequate number of viral copies (<250 copies / mL). A  negative result must be combined with clinical observations, patient history, and epidemiological information.  Fact Sheet for Patients:   RoadLapTop.co.za  Fact Sheet for Healthcare Providers: http://kim-miller.com/  This test is not yet approved or  cleared by the [place] FDA and has been authorized for detection and/or diagnosis of SARS-CoV-2 by FDA under an Emergency Use Authorization (EUA).  This EUA will remain in effect (meaning this test can be used) for the duration of the COVID-19 declaration under Section 564(b)(1) of the Act, 21 U.S.C. section 360bbb-3(b)(1), unless the authorization is terminated or revoked sooner.  Performed at [facility], [address] [place], [place] [address]   Blood culture (routine x 2)     Status: None   Collection Time: 07/16/22  8:13 PM   Specimen: BLOOD  Result Value Ref Range Status   Specimen Description BLOOD LEFT FA  Final   Special Requests   Final    BOTTLES DRAWN AEROBIC AND ANAEROBIC Blood Culture adequate volume   Culture   Final    NO GROWTH 5 DAYS Performed at [facility], [address] [place], [place] [address]    Report Status 07/21/2022 FINAL  Final  Blood culture (routine x 2)     Status: None   Collection Time: 07/16/22  8:13 PM   Specimen: BLOOD  Result Value Ref Range Status   Specimen Description BLOOD LEFT FA  Final   Special Requests   Final    BOTTLES DRAWN AEROBIC AND ANAEROBIC Blood Culture adequate volume   Culture   Final    NO GROWTH 5 DAYS Performed at [facility], [address] [place], [place] [address]    Report Status 07/21/2022 FINAL  Final    Labs: CBC: Recent Labs  Lab 06/18/23 0631  WBC 58.8*  NEUTROABS 4.1  HGB 8.8*  HCT 30.0*  MCV 106.0*  PLT 141*   Basic Metabolic Panel: Recent Labs  Lab 06/18/23 0631 06/19/23 0415  NA 141 140  K 3.9 3.7  CL 113* 110  CO2 18* 19*  GLUCOSE 116* 87   BUN 32* 40*  CREATININE 1.33* 1.54*  CALCIUM 8.4* 8.3*  MG 2.2  --    Liver Function Tests: Recent Labs  Lab 06/18/23 0631  AST 16  ALT 10  ALKPHOS 84  BILITOT 0.7  PROT 5.6*  ALBUMIN 3.8   CBG: No results for input(s): "GLUCAP" in the last 168 hours.  Discharge time spent: greater than 30 minutes.  Signed: Enedina Finner, MD [facility] 06/19/2023

## 2023-06-19 NOTE — Care Management Obs Status (Signed)
MEDICARE OBSERVATION STATUS NOTIFICATION   Patient Details  Name: KAMINI NIST MRN: [id number] Date of Birth: November 09, 1934   Medicare Observation Status Notification Given:  Yes    Margarito Liner, LCSW 06/19/2023, 12:13 PM

## 2023-06-19 NOTE — TOC Transition Note (Signed)
Transition of Care [facility]) - CM/SW Discharge Note   Patient Details  Name: Lindsay [name] MRN: [id number] Date of Birth: 04-Dec-1934  Transition of Care [facility] CM/SW Contact:  Margarito Liner, LCSW Phone Number: 06/19/2023, 12:13 PM   Clinical Narrative:  Patient has orders to discharge home today. She is up independently in the room. Her daughter will transport her home today. No further concerns. CSW signing off.   Final next level of care: Home/Self Care Barriers to Discharge: Barriers Resolved   Patient Goals and CMS Choice      Discharge Placement                  Patient to be transferred to facility by: Daughter   Patient and family notified of of transfer: 06/19/23  Discharge Plan and Services Additional resources added to the After Visit Summary for                                       Social Determinants of Health (SDOH) Interventions SDOH Screenings   Food Insecurity: No Food Insecurity (06/18/2023)  Housing: Low Risk  (06/18/2023)  Transportation Needs: No Transportation Needs (06/18/2023)  Utilities: Not At Risk (06/18/2023)  Depression (PHQ2-9): Low Risk  (08/01/2022)  Tobacco Use: Low Risk  (06/18/2023)     Readmission Risk Interventions     No data to display

## 2023-06-19 NOTE — Discharge Instructions (Signed)

## 2023-06-25 ENCOUNTER — Encounter: Payer: Self-pay | Admitting: Family

## 2023-07-02 ENCOUNTER — Other Ambulatory Visit
Admission: RE | Admit: 2023-07-02 | Discharge: 2023-07-02 | Disposition: A | Discharge status: Home / Self Care | Payer: Medicare Other | Source: Ambulatory Visit | Attending: Family | Admitting: Family

## 2023-07-02 ENCOUNTER — Ambulatory Visit: Payer: Medicare Other | Admitting: Family

## 2023-07-02 ENCOUNTER — Encounter: Payer: Self-pay | Admitting: Family

## 2023-07-02 VITALS — BP 149/49 | HR 71 | Ht 63.0 in | Wt 103.0 lb

## 2023-07-02 DIAGNOSIS — I5022 Chronic systolic (congestive) heart failure: Secondary | ICD-10-CM | POA: Insufficient documentation

## 2023-07-02 DIAGNOSIS — C9110 Chronic lymphocytic leukemia of B-cell type not having achieved remission: Secondary | ICD-10-CM

## 2023-07-02 DIAGNOSIS — I10 Essential (primary) hypertension: Secondary | ICD-10-CM | POA: Diagnosis not present

## 2023-07-02 DIAGNOSIS — D519 Vitamin B12 deficiency anemia, unspecified: Secondary | ICD-10-CM

## 2023-07-02 LAB — BASIC METABOLIC PANEL
Anion gap: 7 (ref 5–15)
BUN: 38 mg/dL — ABNORMAL HIGH (ref 8–23)
CO2: 22 mmol/L (ref 22–32)
Calcium: 9 mg/dL (ref 8.9–10.3)
Chloride: 112 mmol/L — ABNORMAL HIGH (ref 98–111)
Creatinine, Ser: 1.42 mg/dL — ABNORMAL HIGH (ref 0.44–1.00)
GFR, Estimated: 36 mL/min — ABNORMAL LOW (ref >60)
Glucose, Bld: 95 mg/dL (ref 70–99)
Potassium: 4.8 mmol/L (ref 3.5–5.1)
Sodium: 141 mmol/L (ref 135–145)

## 2023-07-02 LAB — BRAIN NATRIURETIC PEPTIDE: B Natriuretic Peptide: 1055 pg/mL — ABNORMAL HIGH (ref 0.0–100.0)

## 2023-07-02 NOTE — Progress Notes (Signed)
PCP: Jerl Mina, MD (last seen 04/24) Primary Cardiologist: None  HPI:  Lindsay [name] is a 89 y/o female with a history of HTN, CKD, CLL (not taking chemo), anemia, LBBB, IBS, fibromyalgia, GERD, RLS and heart failure. She was unable to tolerate treatment of CLL with zanubrutinib due to hyperkalemia, renal failure and extreme fatigue and has elected to stop any other treatment for this.  Admitted 06/18/23 due to shortness of breath due to HF exacerbation. Chest x-ray showed cardiomegaly and interstitial edema. BNP 1254, troponin 80--> 116. IV diuresed. Echo showed slight drop in EF to 40-45% from previous 45-50%.  Echo 05/30/22: EF of 45-50% along with mild/moderate MR Echo 06/19/23: EF 40-45% along with mild LVH, Grade II DD, mildly elevated PA pressure, mild/ moderate MR with moderate AS with Aortic valve area, by VTI measures 1.00 cm. Aortic valve mean gradient measures 13.0 mmHg.   She presents today for a HF follow-up visit with a chief complaint of minimal fatigue with moderate exertion. Chronic in nature. Has no other symptoms and specifically denies any SOB, chest pain, palpitations, abdominal distention, pedal edema, dizziness, difficulty sleeping or weight gain.   Says that she went to bed the night prior to her admission and felt fine. Woke up in the middle of the night and couldn't catch her breath and remained very SOB so she was brought to the hospital. Now she says that she is back to her baseline and feels great.   ROS: All systems negative except as listed in HPI, PMH and Problem List.  SH:  Social History   Socioeconomic History   Marital status: Widowed    Spouse name: Not on file   Number of children: Not on file   Years of education: Not on file   Highest education level: Not on file  Occupational History   Not on file  Tobacco Use   Smoking status: Never   Smokeless tobacco: Never  Substance and Sexual Activity   Alcohol use: No   Drug use: Never   Sexual  activity: Not on file  Other Topics Concern   Not on file  Social History Narrative   Lives with daughter, in [place]. 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    Social Determinants of Health   Financial Resource Strain: Not on file  Food Insecurity: No Food Insecurity (06/18/2023)   Hunger Vital Sign    Worried About Running Out of Food in the Last Year: Never true    Ran Out of Food in the Last Year: Never true  Transportation Needs: No Transportation Needs (06/18/2023)   PRAPARE - Administrator, Civil Service (Medical): No    Lack of Transportation (Non-Medical): No  Physical Activity: Not on file  Stress: Not on file  Social Connections: Not on file  Intimate Partner Violence: Not At Risk (06/18/2023)   Humiliation, Afraid, Rape, and Kick questionnaire    Fear of Current or Ex-Partner: No    Emotionally Abused: No    Physically Abused: No    Sexually Abused: No    FH:  Family History  Problem Relation Age of Onset   Cancer Mother        uterine cancer    Past Medical History:  Diagnosis Date   Arthritis    RHEUMATOID   CHF (congestive heart failure) (HCC)    CLL (chronic lymphocytic leukemia) (HCC)    Edema    MILD ANKLE OCCAS   Fibromyalgia    GERD (gastroesophageal  reflux disease)    Hypertension    Kidney stones    RLS (restless legs syndrome)     Current Outpatient Medications  Medication Sig Dispense Refill   amitriptyline (ELAVIL) 10 MG tablet Take 20 mg by mouth at bedtime.     aspirin EC 81 MG tablet Take 1 tablet (81 mg total) by mouth daily. Swallow whole. 30 tablet 12   cyanocobalamin (,VITAMIN B-12,) 1000 MCG/ML injection Inject into the muscle every 14 (fourteen) days.     cyanocobalamin (VITAMIN B12) 1000 MCG tablet Take 1,000 mcg by mouth daily.     DULoxetine (CYMBALTA) 20 MG capsule Take 20 mg by mouth daily.     feeding supplement, ENSURE ENLIVE, (ENSURE ENLIVE) LIQD Take 237 mLs by mouth 2 (two) times daily between  meals. 237 mL 12   furosemide (LASIX) 20 MG tablet Take 2 tablets (40 mg total) by mouth daily. 30 tablet 0   HYDROcodone-acetaminophen (NORCO) 10-325 MG tablet Take 1 tablet by mouth every 6 (six) hours as needed.     sacubitril-valsartan (ENTRESTO) 24-26 MG Take 1 tablet by mouth 2 (two) times daily.     No current facility-administered medications for this visit.   Vitals:   07/02/23 1101  BP: (!) 149/49  Pulse: 71  SpO2: 100%  Weight: 103 lb (46.7 kg)  Height: 5\' 3"  (1.6 m)   Wt Readings from Last 3 Encounters:  07/02/23 103 lb (46.7 kg)  06/19/23 94 lb 12.8 oz (43 kg)  05/23/23 102 lb 3.2 oz (46.4 kg)   Lab Results  Component Value Date   CREATININE 1.54 (H) 06/19/2023   CREATININE 1.33 (H) 06/18/2023   CREATININE 1.27 (H) 05/07/2023   PHYSICAL EXAM:  General:  Well appearing. No resp difficulty HEENT: normal Neck: supple. JVP flat. Enlarged lymph nodes bilaterally Cor: PMI normal. Regular rate & rhythm. No rubs, gallops or murmurs. Lungs: clear Abdomen: soft, nontender, nondistended. No hepatosplenomegaly. No bruits or masses.  Extremities: no cyanosis, clubbing, rash, edema Neuro: alert & orientedx3, cranial nerves grossly intact. Moves all 4 extremities w/o difficulty. Affect pleasant.  ECG: not done  ASSESSMENT & PLAN:  1: NICM with mildly reduced ejection fraction- - likely HTN/ CLL - NYHA class II - euvolemic - weighing daily; reminded to call for an overnight weight gain of > 2 pounds or a weekly weight gain of > 5 pounds - weight stable from last visit here 6 weeks ago - Echo 05/30/22: EF of 45-50% along with mild/moderate MR - Echo 06/19/23: EF 40-45% along with mild LVH, Grade II DD, mildly elevated PA pressure, mild/ moderate MR with moderate AS with Aortic valve area, by VTI measures 1.00 cm. Aortic valve mean gradient measures 13.0 mmHg.  - watching her sodium intake closely - continue furosemide 40mg  daily - continue entresto 24/26mg  BID - consider  adding carvedilol or spironolactone at next visit as they would like to defer today - history of recurrent UTI so most likely will not be a candidate for SGLT2 - BNP 06/18/23 was 1254.1 - BNP today  2: HTN- - BP 149/49 - saw PCP Burnett Sheng) 04/24 - BMP 06/19/23 showed sodium 140, potassium 3.7, creatinine 1.54 and GFR 32 - check BMP today  3: Anemia- - taking oral B12 daily and injection every 2 weeks - hemoglobin 06/18/23 was 8.8 - vitamin B12 level 04/30/23 was 1164  4: CLL- - saw oncology Donneta Romberg) 05/24 & patient elected to stop any further treatments due to to side effects; referral to  hospice was made - went to cancer center symptom clinic and saw Laurette Schimke, NP 05/24 - WBC 06/18/23 was 58.8  Return in 2 months, sooner if needed.

## 2023-07-02 NOTE — Patient Instructions (Signed)
[facility] to [facility] to get your lab work drawn.  

## 2023-07-03 ENCOUNTER — Other Ambulatory Visit: Payer: Self-pay

## 2023-07-05 ENCOUNTER — Other Ambulatory Visit: Payer: Self-pay

## 2023-08-06 ENCOUNTER — Ambulatory Visit (INDEPENDENT_AMBULATORY_CARE_PROVIDER_SITE_OTHER): Payer: Medicare Other | Admitting: Podiatry

## 2023-08-06 DIAGNOSIS — Z91199 Patient's noncompliance with other medical treatment and regimen due to unspecified reason: Secondary | ICD-10-CM

## 2023-08-06 NOTE — Progress Notes (Signed)
1. No-show for appointment   Recent hospitalization.

## 2023-08-06 NOTE — Progress Notes (Unsigned)
PCP: Jerl Mina, MD (last seen 04/24) Primary Cardiologist: None  HPI:  Lindsay [name] is a 89 y/o female with a history of HTN, CKD, CLL (not taking chemo), anemia, LBBB, IBS, fibromyalgia, GERD, RLS and heart failure. She was unable to tolerate treatment of CLL with zanubrutinib due to hyperkalemia, renal failure and extreme fatigue and has elected to stop any other treatment for this.  Admitted 06/18/23 due to shortness of breath due to HF exacerbation. Chest x-ray showed cardiomegaly and interstitial edema. BNP 1254, troponin 80--> 116. IV diuresed. Echo showed slight drop in EF to 40-45% from previous 45-50%.  Echo 05/30/22: EF of 45-50% along with mild/moderate MR Echo 06/19/23: EF 40-45% along with mild LVH, Grade II DD, mildly elevated PA pressure, mild/ moderate MR with moderate AS with Aortic valve area, by VTI measures 1.00 cm. Aortic valve mean gradient measures 13.0 mmHg.   She presents today for a HF follow-up visit with a chief complaint of moderate shortness of breath with minimal exertion. Chronic in nature although has worsened over the last few days. She has associated cough and worsening fatigue along with this. Denies chest pain, palpitations, abdominal distention, pedal edema, dizziness, weight gain or difficulty sleeping.   Recently tested + for covid 8 days ago and took 1 dose of paxlovid. Didn't take anymore because she says the medication made her extremely fatigue. Negative covid test today at home.   ROS: All systems negative except as listed in HPI, PMH and Problem List.  SH:  Social History   Socioeconomic History   Marital status: Widowed    Spouse name: Not on file   Number of children: Not on file   Years of education: Not on file   Highest education level: Not on file  Occupational History   Not on file  Tobacco Use   Smoking status: Never   Smokeless tobacco: Never  Substance and Sexual Activity   Alcohol use: No   Drug use: Never   Sexual activity:  Not on file  Other Topics Concern   Not on file  Social History Narrative   Lives with daughter, in [place]. 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    Social Determinants of Health   Financial Resource Strain: Not on file  Food Insecurity: No Food Insecurity (06/18/2023)   Hunger Vital Sign    Worried About Running Out of Food in the Last Year: Never true    Ran Out of Food in the Last Year: Never true  Transportation Needs: No Transportation Needs (06/18/2023)   PRAPARE - Administrator, Civil Service (Medical): No    Lack of Transportation (Non-Medical): No  Physical Activity: Not on file  Stress: Not on file  Social Connections: Not on file  Intimate Partner Violence: Not At Risk (06/18/2023)   Humiliation, Afraid, Rape, and Kick questionnaire    Fear of Current or Ex-Partner: No    Emotionally Abused: No    Physically Abused: No    Sexually Abused: No    FH:  Family History  Problem Relation Age of Onset   Cancer Mother        uterine cancer    Past Medical History:  Diagnosis Date   Arthritis    RHEUMATOID   CHF (congestive heart failure) (HCC)    CLL (chronic lymphocytic leukemia) (HCC)    Edema    MILD ANKLE OCCAS   Fibromyalgia    GERD (gastroesophageal reflux disease)    Hypertension  Kidney stones    RLS (restless legs syndrome)     Current Outpatient Medications  Medication Sig Dispense Refill   amitriptyline (ELAVIL) 10 MG tablet Take 20 mg by mouth at bedtime.     aspirin EC 81 MG tablet Take 1 tablet (81 mg total) by mouth daily. Swallow whole. (Patient not taking: Reported on 07/02/2023) 30 tablet 12   cyanocobalamin (,VITAMIN B-12,) 1000 MCG/ML injection Inject into the muscle every 14 (fourteen) days.     cyanocobalamin (VITAMIN B12) 1000 MCG tablet Take 1,000 mcg by mouth daily.     DULoxetine (CYMBALTA) 20 MG capsule Take 20 mg by mouth daily.     feeding supplement, ENSURE ENLIVE, (ENSURE ENLIVE) LIQD Take 237 mLs by  mouth 2 (two) times daily between meals. 237 mL 12   furosemide (LASIX) 20 MG tablet Take 2 tablets (40 mg total) by mouth daily. 30 tablet 0   HYDROcodone-acetaminophen (NORCO) 10-325 MG tablet Take 1 tablet by mouth every 6 (six) hours as needed.     sacubitril-valsartan (ENTRESTO) 24-26 MG Take 1 tablet by mouth 2 (two) times daily.     No current facility-administered medications for this visit.   Vitals:   08/07/23 1429  BP: (!) 137/41  Pulse: 67  SpO2: 100%  Weight: 104 lb (47.2 kg)   Wt Readings from Last 3 Encounters:  08/07/23 104 lb (47.2 kg)  07/02/23 103 lb (46.7 kg)  06/19/23 94 lb 12.8 oz (43 kg)   Lab Results  Component Value Date   CREATININE 1.42 (H) 07/02/2023   CREATININE 1.54 (H) 06/19/2023   CREATININE 1.33 (H) 06/18/2023   PHYSICAL EXAM:  General:  Well appearing. No resp difficulty HEENT: normal Neck: supple. JVP elevated. Enlarged lymph nodes bilaterally Cor: PMI normal. Regular rate & rhythm. No rubs, gallops or murmurs. Lungs: clear Abdomen: soft, nontender, nondistended. No hepatosplenomegaly. No bruits or masses.  Extremities: no cyanosis, clubbing, rash, edema Neuro: alert & oriented x3, cranial nerves grossly intact. Moves all 4 extremities w/o difficulty. Affect pleasant.  ECG: not done  ReDs: 48%  ASSESSMENT & PLAN:  1: NICM with mildly reduced ejection fraction- - likely HTN/ CLL - NYHA class III - fluid overloaded with elevated ReDs and worsening symptoms after recent covid illness - weighing daily; reminded to call for an overnight weight gain of > 2 pounds or a weekly weight gain of > 5 pounds - weight up 1 pound from last visit here 1 month ago - ReDs reading is 48% - give metolazone 2.5mg  daily for 2-3 days and re-evaluate in 3 days - will not give potassium as she's been hyperkalemic in the past - BMP in 3 days - Echo 05/30/22: EF of 45-50% along with mild/moderate MR - Echo 06/19/23: EF 40-45% along with mild LVH, Grade II DD,  mildly elevated PA pressure, mild/ moderate MR with moderate AS with Aortic valve area, by VTI measures 1.00 cm. Aortic valve mean gradient measures 13.0 mmHg.  - watching her sodium intake closely - continue furosemide 40mg  daily - continue entresto 24/26mg  BID - consider adding carvedilol or spironolactone at future visits - history of recurrent UTI so most likely will not be a candidate for SGLT2 - BNP 07/02/23 was 1055.0 - pro-BNP in 3 days  2: HTN- - BP 137/41 - saw PCP Lindsay [name]) 04/24 - BMP 07/02/23 showed sodium 141, potassium 4.8, creatinine 1.42 and GFR 36  3: Anemia- - taking oral B12 daily and injection every 2 weeks - hemoglobin 06/18/23  was 8.8 - vitamin B12 level 04/30/23 was 1164  4: CLL- - saw oncology Lindsay [name]) 05/24 & patient elected to stop any further treatments due to to side effects; referral to hospice was made - went to cancer center symptom clinic and saw Lindsay Schimke, NP 05/24 - WBC 06/18/23 was 58.8  Return in 3 days, sooner if needed

## 2023-08-07 ENCOUNTER — Ambulatory Visit: Payer: Medicare Other | Attending: Family | Admitting: Family

## 2023-08-07 ENCOUNTER — Encounter: Payer: Self-pay | Admitting: Family

## 2023-08-07 VITALS — BP 137/41 | HR 67 | Wt 104.0 lb

## 2023-08-07 DIAGNOSIS — I10 Essential (primary) hypertension: Secondary | ICD-10-CM

## 2023-08-07 DIAGNOSIS — D519 Vitamin B12 deficiency anemia, unspecified: Secondary | ICD-10-CM | POA: Diagnosis not present

## 2023-08-07 DIAGNOSIS — C9110 Chronic lymphocytic leukemia of B-cell type not having achieved remission: Secondary | ICD-10-CM | POA: Diagnosis not present

## 2023-08-07 DIAGNOSIS — I5022 Chronic systolic (congestive) heart failure: Secondary | ICD-10-CM

## 2023-08-07 DIAGNOSIS — I428 Other cardiomyopathies: Secondary | ICD-10-CM | POA: Diagnosis not present

## 2023-08-07 DIAGNOSIS — N189 Chronic kidney disease, unspecified: Secondary | ICD-10-CM | POA: Insufficient documentation

## 2023-08-07 DIAGNOSIS — I130 Hypertensive heart and chronic kidney disease with heart failure and stage 1 through stage 4 chronic kidney disease, or unspecified chronic kidney disease: Secondary | ICD-10-CM | POA: Diagnosis present

## 2023-08-07 DIAGNOSIS — D631 Anemia in chronic kidney disease: Secondary | ICD-10-CM | POA: Diagnosis not present

## 2023-08-07 MED ORDER — METOLAZONE 2.5 MG PO TABS: ORAL_TABLET | ORAL | 0 refills | Status: DC

## 2023-08-07 NOTE — Patient Instructions (Signed)
Take Metolazone 2.5 mg for three days  Follow up Friday.  Do the following things EVERYDAY: Weigh yourself in the morning before breakfast. Write it down and keep it in a log. Take your medicines as prescribed Eat low salt foods--Limit salt (sodium) to 2000 mg per day.  Stay as active as you can everyday Limit all fluids for the day to less than 2 liters

## 2023-08-09 NOTE — Progress Notes (Signed)
PCP: Jerl Mina, MD (last seen 04/24) Primary Cardiologist: None  HPI:  Ms Wiebers is a 89 y/o female with a history of HTN, CKD, CLL (not taking chemo), anemia, LBBB, IBS, fibromyalgia, GERD, RLS and heart failure. She was unable to tolerate treatment of CLL with zanubrutinib due to hyperkalemia, renal failure and extreme fatigue and has elected to stop any other treatment for this.  Admitted 06/18/23 due to shortness of breath due to HF exacerbation. Chest x-ray showed cardiomegaly and interstitial edema. BNP 1254, troponin 80--> 116. IV diuresed. Echo showed slight drop in EF to 40-45% from previous 45-50%.  Echo 05/30/22: EF of 45-50% along with mild/moderate MR Echo 06/19/23: EF 40-45% along with mild LVH, Grade II DD, mildly elevated PA pressure, mild/ moderate MR with moderate AS with Aortic valve area, by VTI measures 1.00 cm. Aortic valve mean gradient measures 13.0 mmHg.   She presents today for a HF follow-up visit with a chief complaint of moderate SOB with minimal exertion although better from last visit a few days ago. She has moderate fatigue along with this. Denies chest pain, palpitations, abdominal distention, pedal edema, dizziness, weight gain or difficulty sleeping.   At last visit she took metolazone 2.5mg  daily for 3 days & her daughter, Lambert Mody, says that she feels like patient is improving.   ROS: All systems negative except as listed in HPI, PMH and Problem List.  SH:  Social History   Socioeconomic History   Marital status: Widowed    Spouse name: Not on file   Number of children: Not on file   Years of education: Not on file   Highest education level: Not on file  Occupational History   Not on file  Tobacco Use   Smoking status: Never   Smokeless tobacco: Never  Substance and Sexual Activity   Alcohol use: No   Drug use: Never   Sexual activity: Not on file  Other Topics Concern   Not on file  Social History Narrative   Lives with daughter, in [place].  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    Social Determinants of Health   Financial Resource Strain: Not on file  Food Insecurity: No Food Insecurity (06/18/2023)   Hunger Vital Sign    Worried About Running Out of Food in the Last Year: Never true    Ran Out of Food in the Last Year: Never true  Transportation Needs: No Transportation Needs (06/18/2023)   PRAPARE - Administrator, Civil Service (Medical): No    Lack of Transportation (Non-Medical): No  Physical Activity: Not on file  Stress: Not on file  Social Connections: Not on file  Intimate Partner Violence: Not At Risk (06/18/2023)   Humiliation, Afraid, Rape, and Kick questionnaire    Fear of Current or Ex-Partner: No    Emotionally Abused: No    Physically Abused: No    Sexually Abused: No    FH:  Family History  Problem Relation Age of Onset   Cancer Mother        uterine cancer    Past Medical History:  Diagnosis Date   Arthritis    RHEUMATOID   CHF (congestive heart failure) (HCC)    CLL (chronic lymphocytic leukemia) (HCC)    Edema    MILD ANKLE OCCAS   Fibromyalgia    GERD (gastroesophageal reflux disease)    Hypertension    Kidney stones    RLS (restless legs syndrome)     Current Outpatient  Medications  Medication Sig Dispense Refill   amitriptyline (ELAVIL) 10 MG tablet Take 20 mg by mouth at bedtime.     aspirin EC 81 MG tablet Take 1 tablet (81 mg total) by mouth daily. Swallow whole. (Patient not taking: Reported on 08/07/2023) 30 tablet 12   cyanocobalamin (,VITAMIN B-12,) 1000 MCG/ML injection Inject into the muscle every 14 (fourteen) days.     cyanocobalamin (VITAMIN B12) 1000 MCG tablet Take 1,000 mcg by mouth daily.     DULoxetine (CYMBALTA) 20 MG capsule Take 20 mg by mouth daily.     feeding supplement, ENSURE ENLIVE, (ENSURE ENLIVE) LIQD Take 237 mLs by mouth 2 (two) times daily between meals. 237 mL 12   furosemide (LASIX) 20 MG tablet Take 2 tablets (40 mg total)  by mouth daily. 30 tablet 0   HYDROcodone-acetaminophen (NORCO) 10-325 MG tablet Take 1 tablet by mouth every 6 (six) hours as needed.     metolazone (ZAROXOLYN) 2.5 MG tablet Take 1 tablet by mouth daily as needed. 10 tablet 0   sacubitril-valsartan (ENTRESTO) 24-26 MG Take 1 tablet by mouth 2 (two) times daily.     No current facility-administered medications for this visit.   Vitals:   08/10/23 1346  BP: (!) 144/33  Pulse: 65  SpO2: 100%  Weight: 102 lb (46.3 kg)   Wt Readings from Last 3 Encounters:  08/10/23 102 lb (46.3 kg)  08/07/23 104 lb (47.2 kg)  07/02/23 103 lb (46.7 kg)   Lab Results  Component Value Date   CREATININE 1.42 (H) 07/02/2023   CREATININE 1.54 (H) 06/19/2023   CREATININE 1.33 (H) 06/18/2023    PHYSICAL EXAM:  General:  Well appearing. No resp difficulty HEENT: normal Neck: supple. JVP elevated. Enlarged lymph nodes bilaterally Cor: PMI normal. Regular rate & rhythm. No rubs, gallops or murmurs. Lungs: clear Abdomen: soft, nontender, nondistended. No hepatosplenomegaly. No bruits or masses.  Extremities: no cyanosis, clubbing, rash, edema Neuro: alert & oriented x3, cranial nerves grossly intact. Moves all 4 extremities w/o difficulty. Affect pleasant.  ECG: not done  ReDs: 45%  ASSESSMENT & PLAN:  1: NICM with mildly reduced ejection fraction- - likely HTN/ CLL - NYHA class III - continues to be fluid overloaded with moderate SOB and elevated ReDs reading - weighing daily; reminded to call for an overnight weight gain of > 2 pounds or a weekly weight gain of > 5 pounds - weight down 2 pounds from last visit here 3 days ago - ReDs reading is 45%; last reading 3 days ago was 48% - BMP today as she took 3 doses of metolazone this week; will skip tomorrow and give 3 more days of metolazone (Sun/ Mon/ Tues) - after BMP results obtained today, will call patient if she needs potassium supplements - Echo 05/30/22: EF of 45-50% along with  mild/moderate MR - Echo 06/19/23: EF 40-45% along with mild LVH, Grade II DD, mildly elevated PA pressure, mild/ moderate MR with moderate AS with Aortic valve area, by VTI measures 1.00 cm. Aortic valve mean gradient measures 13.0 mmHg.  - watching her sodium intake closely - continue furosemide 40mg  daily - continue entresto 24/26mg  BID - consider adding carvedilol or spironolactone at future visits - history of recurrent UTI so most likely will not be a candidate for SGLT2 - BNP 07/02/23 was 1055.0 - pro-BNP today  2: HTN- - BP 144/33 - saw PCP Burnett Sheng) 04/24 - BMP 07/02/23 showed sodium 141, potassium 4.8, creatinine 1.42 and GFR 36  3: Anemia- - taking oral B12 daily and injection every 2 weeks - hemoglobin 06/18/23 was 8.8 - vitamin B12 level 04/30/23 was 1164  4: CLL- - saw oncology Donneta Romberg) 05/24 & patient elected to stop any further treatments due to to side effects; referral to hospice was made - went to cancer center symptom clinic and saw Laurette Schimke, NP 05/24 - WBC 06/18/23 was 58.8  Return in 1 week, sooner if needed

## 2023-08-10 ENCOUNTER — Other Ambulatory Visit
Admission: RE | Admit: 2023-08-10 | Discharge: 2023-08-10 | Disposition: A | Discharge status: Home / Self Care | Payer: Medicare Other | Source: Ambulatory Visit | Attending: Family | Admitting: Family

## 2023-08-10 ENCOUNTER — Ambulatory Visit: Payer: Medicare Other | Admitting: Family

## 2023-08-10 ENCOUNTER — Encounter: Payer: Self-pay | Admitting: Family

## 2023-08-10 VITALS — BP 144/33 | HR 65 | Wt 102.0 lb

## 2023-08-10 DIAGNOSIS — I5022 Chronic systolic (congestive) heart failure: Secondary | ICD-10-CM | POA: Diagnosis present

## 2023-08-10 LAB — BASIC METABOLIC PANEL
Anion gap: 10 (ref 5–15)
BUN: 49 mg/dL — ABNORMAL HIGH (ref 8–23)
CO2: 23 mmol/L (ref 22–32)
Calcium: 8.8 mg/dL — ABNORMAL LOW (ref 8.9–10.3)
Chloride: 106 mmol/L (ref 98–111)
Creatinine, Ser: 1.63 mg/dL — ABNORMAL HIGH (ref 0.44–1.00)
GFR, Estimated: 30 mL/min — ABNORMAL LOW (ref >60)
Glucose, Bld: 100 mg/dL — ABNORMAL HIGH (ref 70–99)
Potassium: 4.7 mmol/L (ref 3.5–5.1)
Sodium: 139 mmol/L (ref 135–145)

## 2023-08-10 MED ORDER — METOLAZONE 2.5 MG PO TABS: 2.5000 mg | ORAL_TABLET | Freq: Every day | ORAL | 1 refills | Status: DC

## 2023-08-10 NOTE — Patient Instructions (Signed)
Take one dose of Metolazone SUNDAY, MONDAY, AND TUESDAY  Follow up with [place] next THURSDAY @ 3:30 PM

## 2023-08-13 ENCOUNTER — Telehealth: Payer: Self-pay

## 2023-08-13 ENCOUNTER — Encounter: Payer: Self-pay | Admitting: Family

## 2023-08-13 ENCOUNTER — Ambulatory Visit: Payer: Medicare Other | Attending: Family | Admitting: Family

## 2023-08-13 VITALS — BP 144/35 | HR 65 | Wt 101.6 lb

## 2023-08-13 DIAGNOSIS — K589 Irritable bowel syndrome without diarrhea: Secondary | ICD-10-CM | POA: Diagnosis not present

## 2023-08-13 DIAGNOSIS — C9110 Chronic lymphocytic leukemia of B-cell type not having achieved remission: Secondary | ICD-10-CM | POA: Insufficient documentation

## 2023-08-13 DIAGNOSIS — K219 Gastro-esophageal reflux disease without esophagitis: Secondary | ICD-10-CM | POA: Diagnosis not present

## 2023-08-13 DIAGNOSIS — I428 Other cardiomyopathies: Secondary | ICD-10-CM | POA: Diagnosis not present

## 2023-08-13 DIAGNOSIS — Z87440 Personal history of urinary (tract) infections: Secondary | ICD-10-CM | POA: Diagnosis not present

## 2023-08-13 DIAGNOSIS — I10 Essential (primary) hypertension: Secondary | ICD-10-CM

## 2023-08-13 DIAGNOSIS — G2581 Restless legs syndrome: Secondary | ICD-10-CM | POA: Insufficient documentation

## 2023-08-13 DIAGNOSIS — I5022 Chronic systolic (congestive) heart failure: Secondary | ICD-10-CM | POA: Diagnosis not present

## 2023-08-13 DIAGNOSIS — I130 Hypertensive heart and chronic kidney disease with heart failure and stage 1 through stage 4 chronic kidney disease, or unspecified chronic kidney disease: Secondary | ICD-10-CM | POA: Insufficient documentation

## 2023-08-13 DIAGNOSIS — D649 Anemia, unspecified: Secondary | ICD-10-CM | POA: Diagnosis not present

## 2023-08-13 DIAGNOSIS — E875 Hyperkalemia: Secondary | ICD-10-CM | POA: Diagnosis not present

## 2023-08-13 DIAGNOSIS — D519 Vitamin B12 deficiency anemia, unspecified: Secondary | ICD-10-CM

## 2023-08-13 DIAGNOSIS — Z79899 Other long term (current) drug therapy: Secondary | ICD-10-CM | POA: Insufficient documentation

## 2023-08-13 DIAGNOSIS — M797 Fibromyalgia: Secondary | ICD-10-CM | POA: Insufficient documentation

## 2023-08-13 DIAGNOSIS — N189 Chronic kidney disease, unspecified: Secondary | ICD-10-CM | POA: Insufficient documentation

## 2023-08-13 NOTE — Patient Instructions (Signed)
Keep up the good work and I'll see you thursday

## 2023-08-13 NOTE — Telephone Encounter (Signed)
Pt's daughter, Gloris Manchester, called to report that her mom is still experiencing shortness of breath despite medication changes from appt last week. She reports minimal weight loss of 102 lb to 101 lb.  Inquiring if her mom can be seen in office today before daughter will be going out of town.  Clarisa Kindred, FNP, made aware. Pt given appt in office today with [facility]. Pt's daughter verbalized agreement and thanks.

## 2023-08-13 NOTE — Progress Notes (Signed)
REDS VEST READING= 40% HEIGHT MARKER=A

## 2023-08-13 NOTE — Progress Notes (Signed)
PCP: Jerl Mina, MD (last seen 04/24) Primary Cardiologist: None  HPI:  Lindsay [name] is a 89 y/o female with a history of HTN, CKD, CLL (not taking chemo), anemia, LBBB, IBS, fibromyalgia, GERD, RLS and heart failure. She was unable to tolerate treatment of CLL with zanubrutinib due to hyperkalemia, renal failure and extreme fatigue and has elected to stop any other treatment for this.  Admitted 06/18/23 due to shortness of breath due to HF exacerbation. Chest x-ray showed cardiomegaly and interstitial edema. BNP 1254, troponin 80--> 116. IV diuresed. Echo showed slight drop in EF to 40-45% from previous 45-50%.  Echo 05/30/22: EF of 45-50% along with mild/moderate MR Echo 06/19/23: EF 40-45% along with mild LVH, Grade II DD, mildly elevated PA pressure, mild/ moderate MR with moderate AS with Aortic valve area, by VTI measures 1.00 cm. Aortic valve mean gradient measures 13.0 mmHg.   She presents today for an acute HF visit with a chief complaint of moderate SOB with minimal exertion. Chronic in nature. Has associated fatigue along with this. Denies chest pain, cough, palpitations, abdominal distention, pedal edema, dizziness or difficulty sleeping. Daughter is going out of state to [place] tomorrow and is anxious about leaving patient as the last time daughter went out of town, her mom ended back up in the hospital. She calls asking if patient can be seen today.   She has taken 2 doses of metolazone and has the last dose to be taken tomorrow.   ROS: All systems negative except as listed in HPI, PMH and Problem List.  SH:  Social History   Socioeconomic History   Marital status: Widowed    Spouse name: Not on file   Number of children: Not on file   Years of education: Not on file   Highest education level: Not on file  Occupational History   Not on file  Tobacco Use   Smoking status: Never   Smokeless tobacco: Never  Substance and Sexual Activity   Alcohol use: No   Drug use: Never    Sexual activity: Not on file  Other Topics Concern   Not on file  Social History Narrative   Lives with daughter, in [place]. 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    Social Determinants of Health   Financial Resource Strain: Not on file  Food Insecurity: No Food Insecurity (06/18/2023)   Hunger Vital Sign    Worried About Running Out of Food in the Last Year: Never true    Ran Out of Food in the Last Year: Never true  Transportation Needs: No Transportation Needs (06/18/2023)   PRAPARE - Administrator, Civil Service (Medical): No    Lack of Transportation (Non-Medical): No  Physical Activity: Not on file  Stress: Not on file  Social Connections: Not on file  Intimate Partner Violence: Not At Risk (06/18/2023)   Humiliation, Afraid, Rape, and Kick questionnaire    Fear of Current or Ex-Partner: No    Emotionally Abused: No    Physically Abused: No    Sexually Abused: No    FH:  Family History  Problem Relation Age of Onset   Cancer Mother        uterine cancer    Past Medical History:  Diagnosis Date   Arthritis    RHEUMATOID   CHF (congestive heart failure) (HCC)    CLL (chronic lymphocytic leukemia) (HCC)    Edema    MILD ANKLE OCCAS   Fibromyalgia  GERD (gastroesophageal reflux disease)    Hypertension    Kidney stones    RLS (restless legs syndrome)     Current Outpatient Medications  Medication Sig Dispense Refill   amitriptyline (ELAVIL) 10 MG tablet Take 20 mg by mouth at bedtime.     aspirin EC 81 MG tablet Take 1 tablet (81 mg total) by mouth daily. Swallow whole. (Patient not taking: Reported on 08/10/2023) 30 tablet 12   cyanocobalamin (,VITAMIN B-12,) 1000 MCG/ML injection Inject into the muscle every 14 (fourteen) days.     cyanocobalamin (VITAMIN B12) 1000 MCG tablet Take 1,000 mcg by mouth daily.     DULoxetine (CYMBALTA) 20 MG capsule Take 20 mg by mouth daily.     feeding supplement, ENSURE ENLIVE, (ENSURE ENLIVE) LIQD  Take 237 mLs by mouth 2 (two) times daily between meals. 237 mL 12   furosemide (LASIX) 20 MG tablet Take 2 tablets (40 mg total) by mouth daily. 30 tablet 0   HYDROcodone-acetaminophen (NORCO) 10-325 MG tablet Take 1 tablet by mouth every 6 (six) hours as needed.     metolazone (ZAROXOLYN) 2.5 MG tablet Take 1 tablet by mouth daily as needed. 10 tablet 0   metolazone (ZAROXOLYN) 2.5 MG tablet Take 1 tablet (2.5 mg total) by mouth daily. 3 tablet 1   sacubitril-valsartan (ENTRESTO) 24-26 MG Take 1 tablet by mouth 2 (two) times daily.     No current facility-administered medications for this visit.   Vitals:   08/13/23 1129  BP: (!) 144/35  Pulse: 65  SpO2: 100%  Weight: 101 lb 9.6 oz (46.1 kg)   Wt Readings from Last 3 Encounters:  08/13/23 101 lb 9.6 oz (46.1 kg)  08/10/23 102 lb (46.3 kg)  08/07/23 104 lb (47.2 kg)   Lab Results  Component Value Date   CREATININE 1.63 (H) 08/10/2023   CREATININE 1.42 (H) 07/02/2023   CREATININE 1.54 (H) 06/19/2023   PHYSICAL EXAM:  General:  Well appearing. No resp difficulty HEENT: normal Neck: supple. JVP elevated. Enlarged lymph nodes bilaterally Cor: PMI normal. Regular rate & rhythm. No rubs, gallops or murmurs. Lungs: clear Abdomen: soft, nontender, nondistended. No hepatosplenomegaly. No bruits or masses.  Extremities: no cyanosis, clubbing, rash, edema Neuro: alert & oriented x3, cranial nerves grossly intact. Moves all 4 extremities w/o difficulty. Affect pleasant.  ECG: not done  ReDs: 40%  ASSESSMENT & PLAN:  1: NICM with mildly reduced ejection fraction- - likely HTN/ CLL - NYHA class III - continues to be fluid overloaded with moderate SOB and elevated ReDs reading although improving - weighing daily; reminded to call for an overnight weight gain of > 2 pounds or a weekly weight gain of > 5 pounds - weight stable from last visit here 3 days ago - ReDs reading is 40%; 3 days ago it was 45% & 1 week ago was 48% - take  the last dose of metolazone tomorrow & will check BMET at next visit in 3 days - Echo 05/30/22: EF of 45-50% along with mild/moderate MR - Echo 06/19/23: EF 40-45% along with mild LVH, Grade II DD, mildly elevated PA pressure, mild/ moderate MR with moderate AS with Aortic valve area, by VTI measures 1.00 cm. Aortic valve mean gradient measures 13.0 mmHg.  - watching her sodium intake closely - continue furosemide 40mg  daily - take last dose of metolazone 2.5mg  tomorrow since ReDs reading still elevated - continue entresto 24/26mg  BID - discussed adding 12.5mg  spironolactone at next visit after lab results  are obtained; daughter prefers to start this when she returns in 1 week - history of recurrent UTI so most likely will not be a candidate for SGLT2 - BNP 07/02/23 was 1055.0  2: HTN- - BP 144/35 - saw PCP Burnett Sheng) 04/24 - BMP 08/10/23 showed sodium 139, potassium 4.7, creatinine 1.63 and GFR 30  3: Anemia- - taking oral B12 daily and injection every 2 weeks - hemoglobin 06/18/23 was 8.8 - vitamin B12 level 04/30/23 was 1164  4: CLL- - saw oncology Donneta Romberg) 05/24 & patient elected to stop any further treatments due to to side effects; referral to hospice was made - went to cancer center symptom clinic and saw Laurette Schimke, NP 05/24 - WBC 06/18/23 was 58.8  Return in 3 days, sooner if needed.

## 2023-08-16 ENCOUNTER — Encounter: Payer: Medicare Other | Admitting: Family

## 2023-08-30 ENCOUNTER — Encounter: Payer: Medicare Other | Admitting: Family

## 2023-09-04 ENCOUNTER — Encounter: Payer: Self-pay | Admitting: Family

## 2023-09-04 ENCOUNTER — Ambulatory Visit: Payer: Medicare Other | Attending: Family | Admitting: Family

## 2023-09-04 VITALS — BP 131/41 | HR 66 | Wt 103.0 lb

## 2023-09-04 DIAGNOSIS — I5042 Chronic combined systolic (congestive) and diastolic (congestive) heart failure: Secondary | ICD-10-CM | POA: Diagnosis present

## 2023-09-04 DIAGNOSIS — I5022 Chronic systolic (congestive) heart failure: Secondary | ICD-10-CM | POA: Diagnosis not present

## 2023-09-04 DIAGNOSIS — I10 Essential (primary) hypertension: Secondary | ICD-10-CM

## 2023-09-04 DIAGNOSIS — I428 Other cardiomyopathies: Secondary | ICD-10-CM | POA: Insufficient documentation

## 2023-09-04 DIAGNOSIS — C9110 Chronic lymphocytic leukemia of B-cell type not having achieved remission: Secondary | ICD-10-CM | POA: Diagnosis present

## 2023-09-04 DIAGNOSIS — N189 Chronic kidney disease, unspecified: Secondary | ICD-10-CM | POA: Diagnosis not present

## 2023-09-04 DIAGNOSIS — D519 Vitamin B12 deficiency anemia, unspecified: Secondary | ICD-10-CM | POA: Diagnosis not present

## 2023-09-04 DIAGNOSIS — I130 Hypertensive heart and chronic kidney disease with heart failure and stage 1 through stage 4 chronic kidney disease, or unspecified chronic kidney disease: Secondary | ICD-10-CM | POA: Diagnosis not present

## 2023-09-04 DIAGNOSIS — D631 Anemia in chronic kidney disease: Secondary | ICD-10-CM | POA: Insufficient documentation

## 2023-09-04 NOTE — Patient Instructions (Signed)
Medication Changes:  None, continue current medication  Lab Work:  Labs done today, your results will be available in MyChart, we will contact you for abnormal readings.  Special Instructions // Education:  Do the following things EVERYDAY: Weigh yourself in the morning before breakfast. Write it down and keep it in a log. Take your medicines as prescribed Eat low salt foods--Limit salt (sodium) to 2000 mg per day.  Stay as active as you can everyday Limit all fluids for the day to less than 2 liters   Follow-Up in: 1 month    If you have any questions or concerns before your next appointment please send [place] a message through mychart or call our office at [phone] Monday-Friday 8 am-5 pm.   If you have an urgent need after hours on the weekend please call your Primary Cardiologist or the [facility] in [place] at [phone].

## 2023-09-04 NOTE — Progress Notes (Signed)
   09/04/23 1127  ReDS Vest / Clip  Station Marker A  Ruler Value 20  ReDS Value Range (!) > 40  ReDS Actual Value 42

## 2023-09-04 NOTE — Progress Notes (Signed)
PCP: Jerl Mina, MD (last seen 04/24) Primary Cardiologist: None  HPI:  Lindsay [name] is a 89 y/o female with a history of HTN, CKD, CLL (not taking chemo), anemia, LBBB, IBS, fibromyalgia, GERD, RLS and heart failure. She was unable to tolerate treatment of CLL with zanubrutinib due to hyperkalemia, renal failure and extreme fatigue and has elected to stop any other treatment for this.  Admitted 06/18/23 due to shortness of breath due to HF exacerbation. Chest x-ray showed cardiomegaly and interstitial edema. BNP 1254, troponin 80--> 116. IV diuresed. Echo showed slight drop in EF to 40-45% from previous 45-50%.  Echo 05/30/22: EF of 45-50% along with mild/moderate MR Echo 06/19/23: EF 40-45% along with mild LVH, Grade II DD, mildly elevated PA pressure, mild/ moderate MR with moderate AS with Aortic valve area, by VTI measures 1.00 cm. Aortic valve mean gradient measures 13.0 mmHg.   She presents today for a HF follow-up visit with a chief complaint of intermittent shortness of breath with moderate exertion. Chronic in nature. Has associated fatigue along with this. Denies chest pain, cough, palpitations, abdominal distention, pedal edema, dizziness or difficulty sleeping. Overall, she says that she's feeling quite well.   ROS: All systems negative except as listed in HPI, PMH and Problem List.  SH:  Social History   Socioeconomic History   Marital status: Widowed    Spouse name: Not on file   Number of children: Not on file   Years of education: Not on file   Highest education level: Not on file  Occupational History   Not on file  Tobacco Use   Smoking status: Never   Smokeless tobacco: Never  Substance and Sexual Activity   Alcohol use: No   Drug use: Never   Sexual activity: Not on file  Other Topics Concern   Not on file  Social History Narrative   Lives with daughter, in [place]. 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    Social Determinants of Health    Financial Resource Strain: Not on file  Food Insecurity: No Food Insecurity (06/18/2023)   Hunger Vital Sign    Worried About Running Out of Food in the Last Year: Never true    Ran Out of Food in the Last Year: Never true  Transportation Needs: No Transportation Needs (06/18/2023)   PRAPARE - Administrator, Civil Service (Medical): No    Lack of Transportation (Non-Medical): No  Physical Activity: Not on file  Stress: Not on file  Social Connections: Not on file  Intimate Partner Violence: Not At Risk (06/18/2023)   Humiliation, Afraid, Rape, and Kick questionnaire    Fear of Current or Ex-Partner: No    Emotionally Abused: No    Physically Abused: No    Sexually Abused: No    FH:  Family History  Problem Relation Age of Onset   Cancer Mother        uterine cancer    Past Medical History:  Diagnosis Date   Arthritis    RHEUMATOID   CHF (congestive heart failure) (HCC)    CLL (chronic lymphocytic leukemia) (HCC)    Edema    MILD ANKLE OCCAS   Fibromyalgia    GERD (gastroesophageal reflux disease)    Hypertension    Kidney stones    RLS (restless legs syndrome)     Current Outpatient Medications  Medication Sig Dispense Refill   amitriptyline (ELAVIL) 10 MG tablet Take 20 mg by mouth at bedtime.  aspirin EC 81 MG tablet Take 1 tablet (81 mg total) by mouth daily. Swallow whole. (Patient not taking: Reported on 08/10/2023) 30 tablet 12   cyanocobalamin (,VITAMIN B-12,) 1000 MCG/ML injection Inject into the muscle every 14 (fourteen) days.     cyanocobalamin (VITAMIN B12) 1000 MCG tablet Take 1,000 mcg by mouth daily.     DULoxetine (CYMBALTA) 20 MG capsule Take 20 mg by mouth daily.     feeding supplement, ENSURE ENLIVE, (ENSURE ENLIVE) LIQD Take 237 mLs by mouth 2 (two) times daily between meals. 237 mL 12   furosemide (LASIX) 20 MG tablet Take 2 tablets (40 mg total) by mouth daily. 30 tablet 0   HYDROcodone-acetaminophen (NORCO) 10-325 MG tablet Take 1  tablet by mouth every 6 (six) hours as needed.     metolazone (ZAROXOLYN) 2.5 MG tablet Take 1 tablet by mouth daily as needed. 10 tablet 0   metolazone (ZAROXOLYN) 2.5 MG tablet Take 1 tablet (2.5 mg total) by mouth daily. 3 tablet 1   sacubitril-valsartan (ENTRESTO) 24-26 MG Take 1 tablet by mouth 2 (two) times daily.     No current facility-administered medications for this visit.   Vitals:   09/04/23 1108  BP: (!) 131/41  Pulse: 66  SpO2: 100%  Weight: 103 lb (46.7 kg)   Wt Readings from Last 3 Encounters:  09/04/23 103 lb (46.7 kg)  08/13/23 101 lb 9.6 oz (46.1 kg)  08/10/23 102 lb (46.3 kg)   Lab Results  Component Value Date   CREATININE 1.63 (H) 08/10/2023   CREATININE 1.42 (H) 07/02/2023   CREATININE 1.54 (H) 06/19/2023   PHYSICAL EXAM:  General:  Well appearing. No resp difficulty HEENT: normal Neck: supple. JVP elevated. Enlarged lymph nodes bilaterally Cor: PMI normal. Regular rate & rhythm. No rubs, gallops or murmurs. Lungs: clear Abdomen: soft, nontender, nondistended. No hepatosplenomegaly. No bruits or masses.  Extremities: no cyanosis, clubbing, rash, edema Neuro: alert & oriented x3, cranial nerves grossly intact. Moves all 4 extremities w/o difficulty. Affect pleasant.  ECG: not done  ReDs: 42%  ASSESSMENT & PLAN:  1: NICM with mildly reduced ejection fraction- - likely HTN/ CLL - NYHA class II - euvolemic - weighing daily; reminded to call for an overnight weight gain of > 2 pounds or a weekly weight gain of > 5 pounds - weight up 2 pounds from last visit here 3 weeks ago - ReDs reading is 42%; 3 weeks ago it was 40%, beginning to think her normal is low 40's as her symptoms don't correlate with this reading - Echo 05/30/22: EF of 45-50% along with mild/moderate MR - Echo 06/19/23: EF 40-45% along with mild LVH, Grade II DD, mildly elevated PA pressure, mild/ moderate MR with moderate AS with Aortic valve area, by VTI measures 1.00 cm. Aortic valve  mean gradient measures 13.0 mmHg.  - watching her sodium intake closely - continue furosemide 40mg  daily - continue entresto 24/26mg  BID - BMP today and then add 12.5mg  spironolactone daily - has had elevated K+ in the past so discussed high potassium foods to avoid - history of recurrent UTI so most likely will not be a candidate for SGLT2 - BNP 07/02/23 was 1055.0  2: HTN- - BP 131/41 - saw PCP Lindsay [name]) 04/24 - BMP 08/10/23 showed sodium 139, potassium 4.7, creatinine 1.63 and GFR 30 - repeat BMP today  3: Anemia- - taking oral B12 daily and injection every 2 weeks - hemoglobin 06/18/23 was 8.8 - vitamin B12 level 04/30/23  was 1164  4: CLL- - saw oncology Lindsay [name]) 05/24 & patient elected to stop any further treatments due to to side effects; referral to hospice was made - went to cancer center symptom clinic and saw Lindsay Schimke, NP 05/24 - WBC 06/18/23 was 58.8  Return in 1 month, sooner if needed.

## 2023-09-05 ENCOUNTER — Other Ambulatory Visit: Payer: Self-pay

## 2023-09-05 ENCOUNTER — Encounter: Payer: Self-pay | Admitting: Family

## 2023-09-05 ENCOUNTER — Telehealth: Payer: Self-pay

## 2023-09-05 ENCOUNTER — Encounter: Payer: Self-pay | Admitting: Internal Medicine

## 2023-09-05 NOTE — Telephone Encounter (Signed)
Pt called asking if she should take Metolazone daily. Medication not on current list.  Spoke with Clarisa Kindred FNP. Pt should not be taking Metolazone daily.  Pt aware, agreeable, and verbalized understanding

## 2023-09-07 ENCOUNTER — Telehealth: Payer: Self-pay

## 2023-09-10 NOTE — Telephone Encounter (Signed)
Spoke with pt's daughter regarding care advise per Dr. Gala Romney. She had good understanding and no further questions.

## 2023-09-17 ENCOUNTER — Telehealth: Payer: Self-pay | Admitting: Family

## 2023-09-17 NOTE — Telephone Encounter (Signed)
Pt's daughter confirms that her mom is taking 2 tablets (40 mg) lasix daily.  Reports 1 pound weight gain, and states she thinks has a little more fluid on her. She states "otherwise she is doing good".  Per Clarisa Kindred, FNP: Pt can take one extra lasix tablet today. Call our office if pt weight or symptoms continue to increase, and before taking any other extra lasix. Pt's daughter agreeable and verbalized understanding.

## 2023-09-18 ENCOUNTER — Other Ambulatory Visit: Payer: Self-pay

## 2023-09-18 ENCOUNTER — Telehealth: Payer: Self-pay

## 2023-09-18 NOTE — Progress Notes (Deleted)
PCP: Jerl Mina, MD (last seen 04/24) Primary Cardiologist: None  HPI:  Lindsay [name] is a 89 y/o female with a history of HTN, CKD, CLL (not taking chemo), anemia, LBBB, IBS, fibromyalgia, GERD, RLS and heart failure. She was unable to tolerate treatment of CLL with zanubrutinib due to hyperkalemia, renal failure and extreme fatigue and has elected to stop any other treatment for this.  Admitted 06/18/23 due to shortness of breath due to HF exacerbation. Chest x-ray showed cardiomegaly and interstitial edema. BNP 1254, troponin 80--> 116. IV diuresed. Echo showed slight drop in EF to 40-45% from previous 45-50%.  Echo 05/30/22: EF of 45-50% along with mild/moderate MR Echo 06/19/23: EF 40-45% along with mild LVH, Grade II DD, mildly elevated PA pressure, mild/ moderate MR with moderate AS with Aortic valve area, by VTI measures 1.00 cm. Aortic valve mean gradient measures 13.0 mmHg.   She presents today for an acute HF visit with a chief complaint of   Patient called the office c/o slight weight gain. Was instructed to take an additional 20mg  torsemide but then gained another pound.   ROS: All systems negative except as listed in HPI, PMH and Problem List.  SH:  Social History   Socioeconomic History   Marital status: Widowed    Spouse name: Not on file   Number of children: Not on file   Years of education: Not on file   Highest education level: Not on file  Occupational History   Not on file  Tobacco Use   Smoking status: Never   Smokeless tobacco: Never  Substance and Sexual Activity   Alcohol use: No   Drug use: Never   Sexual activity: Not on file  Other Topics Concern   Not on file  Social History Narrative   Lives with daughter, in [place]. 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    Social Determinants of Health   Financial Resource Strain: Not on file  Food Insecurity: No Food Insecurity (06/18/2023)   Hunger Vital Sign    Worried About Running Out of Food  in the Last Year: Never true    Ran Out of Food in the Last Year: Never true  Transportation Needs: No Transportation Needs (06/18/2023)   PRAPARE - Administrator, Civil Service (Medical): No    Lack of Transportation (Non-Medical): No  Physical Activity: Not on file  Stress: Not on file  Social Connections: Not on file  Intimate Partner Violence: Not At Risk (06/18/2023)   Humiliation, Afraid, Rape, and Kick questionnaire    Fear of Current or Ex-Partner: No    Emotionally Abused: No    Physically Abused: No    Sexually Abused: No    FH:  Family History  Problem Relation Age of Onset   Cancer Mother        uterine cancer    Past Medical History:  Diagnosis Date   Arthritis    RHEUMATOID   CHF (congestive heart failure) (HCC)    CLL (chronic lymphocytic leukemia) (HCC)    Edema    MILD ANKLE OCCAS   Fibromyalgia    GERD (gastroesophageal reflux disease)    Hypertension    Kidney stones    RLS (restless legs syndrome)     Current Outpatient Medications  Medication Sig Dispense Refill   amitriptyline (ELAVIL) 10 MG tablet Take 20 mg by mouth at bedtime.     aspirin EC 81 MG tablet Take 1 tablet (81 mg total) by mouth  daily. Swallow whole. (Patient not taking: Reported on 08/10/2023) 30 tablet 12   cyanocobalamin (,VITAMIN B-12,) 1000 MCG/ML injection Inject into the muscle every 14 (fourteen) days.     cyanocobalamin (VITAMIN B12) 1000 MCG tablet Take 1,000 mcg by mouth daily.     DULoxetine (CYMBALTA) 20 MG capsule Take 20 mg by mouth daily.     feeding supplement, ENSURE ENLIVE, (ENSURE ENLIVE) LIQD Take 237 mLs by mouth 2 (two) times daily between meals. 237 mL 12   furosemide (LASIX) 20 MG tablet Take 2 tablets (40 mg total) by mouth daily. 30 tablet 0   HYDROcodone-acetaminophen (NORCO) 10-325 MG tablet Take 1 tablet by mouth every 6 (six) hours as needed.     sacubitril-valsartan (ENTRESTO) 24-26 MG Take 1 tablet by mouth 2 (two) times daily.     No  current facility-administered medications for this visit.     PHYSICAL EXAM:  General:  Well appearing. No resp difficulty HEENT: normal Neck: supple. JVP elevated. Enlarged lymph nodes bilaterally Cor: PMI normal. Regular rate & rhythm. No rubs, gallops or murmurs. Lungs: clear Abdomen: soft, nontender, nondistended. No hepatosplenomegaly. No bruits or masses.  Extremities: no cyanosis, clubbing, rash, edema Neuro: alert & oriented x3, cranial nerves grossly intact. Moves all 4 extremities w/o difficulty. Affect pleasant.  ECG: not done  ReDs:   ASSESSMENT & PLAN:  1: NICM with mildly reduced ejection fraction- - likely HTN/ CLL - NYHA class II - euvolemic - weighing daily; reminded to call for an overnight weight gain of > 2 pounds or a weekly weight gain of > 5 pounds - weight 103 pounds from last visit here 2 weeks ago - ReDs reading is         ; 2 weeks ago it was 42% - Echo 05/30/22: EF of 45-50% along with mild/moderate MR - Echo 06/19/23: EF 40-45% along with mild LVH, Grade II DD, mildly elevated PA pressure, mild/ moderate MR with moderate AS with Aortic valve area, by VTI measures 1.00 cm. Aortic valve mean gradient measures 13.0 mmHg.  - watching her sodium intake closely - continue furosemide 40mg  daily - continue entresto 24/26mg  BID - has had elevated K+ in the past so discussed high potassium foods to avoid; most recent potassium was in the 5's so unable to use spironolactone - history of recurrent UTI so most likely will not be a candidate for SGLT2 - BNP 07/02/23 was 1055.0  2: HTN- - BP  - saw PCP Burnett Sheng) 04/24 - BMP 09/04/23 showed sodium 138, potassium 5.0, creatinine 2.00 and GFR 24  3: Anemia- - taking oral B12 daily and injection every 2 weeks - hemoglobin 06/18/23 was 8.8 - vitamin B12 level 04/30/23 was 1164  4: CLL- - saw oncology Donneta Romberg) 05/24 & patient elected to stop any further treatments due to to side effects; referral to hospice was  made - went to cancer center symptom clinic and saw Laurette Schimke, NP 05/24 - WBC 06/18/23 was 58.8

## 2023-09-18 NOTE — Telephone Encounter (Signed)
Pt's daughter called stating the pt has gained another lb over night even with taking the extra torsemide dose. Per Clarisa Kindred, FNP pt needs to come into office to be evaluated. Pt scheduled for 10/2 @ 1PM.

## 2023-09-19 ENCOUNTER — Other Ambulatory Visit: Payer: Self-pay

## 2023-09-19 ENCOUNTER — Emergency Department: Payer: Medicare Other

## 2023-09-19 ENCOUNTER — Encounter: Payer: Self-pay | Admitting: Emergency Medicine

## 2023-09-19 ENCOUNTER — Encounter: Payer: Medicare Other | Admitting: Family

## 2023-09-19 ENCOUNTER — Inpatient Hospital Stay
Admission: EM | Admit: 2023-09-19 | Discharge: 2023-09-21 | DRG: 841 | Disposition: A | Discharge status: Home Health | Payer: Medicare Other | Attending: Osteopathic Medicine | Admitting: Osteopathic Medicine

## 2023-09-19 DIAGNOSIS — D631 Anemia in chronic kidney disease: Secondary | ICD-10-CM | POA: Diagnosis present

## 2023-09-19 DIAGNOSIS — C9110 Chronic lymphocytic leukemia of B-cell type not having achieved remission: Principal | ICD-10-CM | POA: Diagnosis present

## 2023-09-19 DIAGNOSIS — M797 Fibromyalgia: Secondary | ICD-10-CM | POA: Diagnosis present

## 2023-09-19 DIAGNOSIS — D649 Anemia, unspecified: Secondary | ICD-10-CM | POA: Diagnosis not present

## 2023-09-19 DIAGNOSIS — F419 Anxiety disorder, unspecified: Secondary | ICD-10-CM | POA: Diagnosis present

## 2023-09-19 DIAGNOSIS — N1832 Chronic kidney disease, stage 3b: Secondary | ICD-10-CM | POA: Diagnosis present

## 2023-09-19 DIAGNOSIS — E440 Moderate protein-calorie malnutrition: Secondary | ICD-10-CM | POA: Diagnosis present

## 2023-09-19 DIAGNOSIS — K589 Irritable bowel syndrome without diarrhea: Secondary | ICD-10-CM | POA: Diagnosis present

## 2023-09-19 DIAGNOSIS — I130 Hypertensive heart and chronic kidney disease with heart failure and stage 1 through stage 4 chronic kidney disease, or unspecified chronic kidney disease: Secondary | ICD-10-CM | POA: Diagnosis present

## 2023-09-19 DIAGNOSIS — Z66 Do not resuscitate: Secondary | ICD-10-CM | POA: Diagnosis present

## 2023-09-19 DIAGNOSIS — Z96642 Presence of left artificial hip joint: Secondary | ICD-10-CM | POA: Diagnosis present

## 2023-09-19 DIAGNOSIS — Z885 Allergy status to narcotic agent status: Secondary | ICD-10-CM

## 2023-09-19 DIAGNOSIS — R7989 Other specified abnormal findings of blood chemistry: Secondary | ICD-10-CM | POA: Insufficient documentation

## 2023-09-19 DIAGNOSIS — F32A Depression, unspecified: Secondary | ICD-10-CM | POA: Diagnosis present

## 2023-09-19 DIAGNOSIS — Z681 Body mass index (BMI) 19 or less, adult: Secondary | ICD-10-CM

## 2023-09-19 DIAGNOSIS — Z961 Presence of intraocular lens: Secondary | ICD-10-CM | POA: Diagnosis present

## 2023-09-19 DIAGNOSIS — Z79899 Other long term (current) drug therapy: Secondary | ICD-10-CM | POA: Diagnosis not present

## 2023-09-19 DIAGNOSIS — Z888 Allergy status to other drugs, medicaments and biological substances status: Secondary | ICD-10-CM

## 2023-09-19 DIAGNOSIS — Z90710 Acquired absence of both cervix and uterus: Secondary | ICD-10-CM | POA: Diagnosis not present

## 2023-09-19 DIAGNOSIS — D630 Anemia in neoplastic disease: Secondary | ICD-10-CM | POA: Diagnosis present

## 2023-09-19 DIAGNOSIS — I5022 Chronic systolic (congestive) heart failure: Secondary | ICD-10-CM | POA: Diagnosis present

## 2023-09-19 DIAGNOSIS — Z7982 Long term (current) use of aspirin: Secondary | ICD-10-CM | POA: Diagnosis not present

## 2023-09-19 DIAGNOSIS — I10 Essential (primary) hypertension: Secondary | ICD-10-CM | POA: Diagnosis present

## 2023-09-19 DIAGNOSIS — Z1152 Encounter for screening for COVID-19: Secondary | ICD-10-CM | POA: Diagnosis not present

## 2023-09-19 DIAGNOSIS — N1831 Chronic kidney disease, stage 3a: Secondary | ICD-10-CM | POA: Diagnosis present

## 2023-09-19 LAB — BASIC METABOLIC PANEL
Anion gap: 7 (ref 5–15)
BUN: 45 mg/dL — ABNORMAL HIGH (ref 8–23)
CO2: 20 mmol/L — ABNORMAL LOW (ref 22–32)
Calcium: 8.3 mg/dL — ABNORMAL LOW (ref 8.9–10.3)
Chloride: 107 mmol/L (ref 98–111)
Creatinine, Ser: 1.76 mg/dL — ABNORMAL HIGH (ref 0.44–1.00)
GFR, Estimated: 28 mL/min — ABNORMAL LOW (ref >60)
Glucose, Bld: 105 mg/dL — ABNORMAL HIGH (ref 70–99)
Potassium: 4.8 mmol/L (ref 3.5–5.1)
Sodium: 134 mmol/L — ABNORMAL LOW (ref 135–145)

## 2023-09-19 LAB — TROPONIN I (HIGH SENSITIVITY)
Troponin I (High Sensitivity): 38 ng/L — ABNORMAL HIGH (ref <18)
Troponin I (High Sensitivity): 40 ng/L — ABNORMAL HIGH (ref <18)

## 2023-09-19 LAB — CBC
HCT: 19 % — ABNORMAL LOW (ref 36.0–46.0)
Hemoglobin: 5.7 g/dL — ABNORMAL LOW (ref 12.0–15.0)
MCH: 35 pg — ABNORMAL HIGH (ref 26.0–34.0)
MCHC: 30 g/dL (ref 30.0–36.0)
MCV: 116.6 fL — ABNORMAL HIGH (ref 80.0–100.0)
Platelets: 188 10*3/uL (ref 150–400)
RBC: 1.63 MIL/uL — ABNORMAL LOW (ref 3.87–5.11)
RDW: 16.7 % — ABNORMAL HIGH (ref 11.5–15.5)
WBC: 63.2 10*3/uL (ref 4.0–10.5)
nRBC: 0 % (ref 0.0–0.2)

## 2023-09-19 LAB — RESP PANEL BY RT-PCR (RSV, FLU A&B, COVID)  RVPGX2
Influenza A by PCR: NEGATIVE
Influenza B by PCR: NEGATIVE
Resp Syncytial Virus by PCR: NEGATIVE
SARS Coronavirus 2 by RT PCR: NEGATIVE

## 2023-09-19 LAB — HEMOGLOBIN AND HEMATOCRIT, BLOOD
HCT: 17.2 % — ABNORMAL LOW (ref 36.0–46.0)
Hemoglobin: 5.3 g/dL — ABNORMAL LOW (ref 12.0–15.0)

## 2023-09-19 LAB — BRAIN NATRIURETIC PEPTIDE: B Natriuretic Peptide: 1417.2 pg/mL — ABNORMAL HIGH (ref 0.0–100.0)

## 2023-09-19 MED ORDER — HYDROCODONE-ACETAMINOPHEN 5-325 MG PO TABS
1.0000 | ORAL_TABLET | Freq: Once | ORAL | Status: AC
  Administered 2023-09-19: 1 via ORAL
  Filled 2023-09-19: qty 1

## 2023-09-19 MED ORDER — ACETAMINOPHEN 650 MG RE SUPP: 650.0000 mg | Freq: Four times a day (QID) | RECTAL | Status: DC | PRN

## 2023-09-19 MED ORDER — HYDROCODONE-ACETAMINOPHEN 10-325 MG PO TABS
1.0000 | ORAL_TABLET | Freq: Four times a day (QID) | ORAL | Status: DC | PRN
  Administered 2023-09-20 – 2023-09-21 (×2): 1 via ORAL
  Filled 2023-09-19 (×2): qty 1

## 2023-09-19 MED ORDER — ENSURE ENLIVE PO LIQD
237.0000 mL | Freq: Two times a day (BID) | ORAL | Status: DC
  Administered 2023-09-20 – 2023-09-21 (×2): 237 mL via ORAL

## 2023-09-19 MED ORDER — HYDRALAZINE HCL 20 MG/ML IJ SOLN: 5.0000 mg | Freq: Four times a day (QID) | INTRAMUSCULAR | Status: DC | PRN

## 2023-09-19 MED ORDER — SODIUM CHLORIDE 0.9 % IV SOLN: 10.0000 mL/h | Freq: Once | INTRAVENOUS | Status: DC

## 2023-09-19 MED ORDER — AMITRIPTYLINE HCL 10 MG PO TABS
20.0000 mg | ORAL_TABLET | Freq: Every day | ORAL | Status: DC
  Administered 2023-09-19 – 2023-09-20 (×2): 20 mg via ORAL
  Filled 2023-09-19 (×2): qty 2

## 2023-09-19 MED ORDER — ACETAMINOPHEN 325 MG PO TABS: 650.0000 mg | ORAL_TABLET | Freq: Four times a day (QID) | ORAL | Status: DC | PRN

## 2023-09-19 MED ORDER — FUROSEMIDE 40 MG PO TABS
40.0000 mg | ORAL_TABLET | Freq: Every day | ORAL | Status: DC
  Administered 2023-09-20 – 2023-09-21 (×2): 40 mg via ORAL
  Filled 2023-09-19 (×2): qty 1

## 2023-09-19 MED ORDER — ONDANSETRON HCL 4 MG PO TABS: 4.0000 mg | ORAL_TABLET | Freq: Four times a day (QID) | ORAL | Status: DC | PRN

## 2023-09-19 MED ORDER — SENNOSIDES-DOCUSATE SODIUM 8.6-50 MG PO TABS: 1.0000 | ORAL_TABLET | Freq: Every evening | ORAL | Status: DC | PRN

## 2023-09-19 MED ORDER — ONDANSETRON HCL 4 MG/2ML IJ SOLN
4.0000 mg | Freq: Four times a day (QID) | INTRAMUSCULAR | Status: DC | PRN
  Administered 2023-09-20: 4 mg via INTRAVENOUS
  Filled 2023-09-19: qty 2

## 2023-09-19 MED ORDER — VITAMIN B-12 1000 MCG PO TABS
1000.0000 ug | ORAL_TABLET | Freq: Every day | ORAL | Status: DC
  Administered 2023-09-20 – 2023-09-21 (×2): 1000 ug via ORAL
  Filled 2023-09-19 (×2): qty 1

## 2023-09-19 MED ORDER — DULOXETINE HCL 20 MG PO CPEP
20.0000 mg | ORAL_CAPSULE | Freq: Every day | ORAL | Status: DC
  Administered 2023-09-20 – 2023-09-21 (×2): 20 mg via ORAL
  Filled 2023-09-19 (×2): qty 1

## 2023-09-19 NOTE — Assessment & Plan Note (Signed)
Home Entresto not resumed on admission due to mild elevation in serum creatinine; AM team to resume when the benefits outweigh the risks Home furosemide 20 mg daily resumed for 09/20/2023 Hydralazine 5 mg IV every 6 hours as needed for SBP greater than 165, 5 days ordered

## 2023-09-19 NOTE — Assessment & Plan Note (Signed)
Mild decrease in eGFR, on admission was 28 Does not meet criteria for acute kidney injury

## 2023-09-19 NOTE — Assessment & Plan Note (Signed)
Home amitriptyline 20 mg nightly and duloxetine 20 mg daily resumed

## 2023-09-19 NOTE — Hospital Course (Signed)
Ms. Hoang Reich is a 89 year old female with history of hypertension, CLL, irritable bowel syndrome, history of macrocytic anemia, depression, anxiety, moderate protein calorie malnutrition, history of symptomatic anemia requiring blood transfusion, combined heart failure, who presents emergency department for chief concerns of shortness of breath.  Vitals in the ED showed temperature of 98, respiration rate 24, heart rate of 80, blood pressure 134/41, SpO2 of 98% on room air.  Serum sodium is 136, potassium 4.8, chloride 107, bicarb 20, BUN of 45, serum creatinine 1.76, eGFR 28, nonfasting blood glucose 105, WBC 63.2, hemoglobin 5.7, platelets of 188.  ED treatment: Hydrocodone 5-325 mg p.o. one-time dose, prepare RBC crossmatch, type and screen, and 1 unit of blood transfusion.

## 2023-09-19 NOTE — Assessment & Plan Note (Addendum)
Status post type and screen, crossmatch, and 1 unit of PRBC ordered for transfusion via EDP Patient baseline hemoglobin is 7.3-9.0 Scheduled time hemoglobin/hematocrit recheck on day of admission at 1900 hrs. Goal hemoglobin greater than 7 on admission

## 2023-09-19 NOTE — ED Provider Notes (Signed)
[facility] Provider Note    Event Date/Time   First MD Initiated Contact with Patient 09/19/23 1011     (approximate)   History   Shortness of Breath   HPI  Lindsay [name] is a 89 y.o. female with a history of CHF, hypertension, CKD, CLL, and IBS who presents with shortness of breath, acute onset when she awoke today.  The patient states that she was in her usual state of health last night.  She has some chest pain described as pressure.  She denies any cough or fever.  She has no vomiting or diarrhea.  The patient denies any abnormal bleeding or blood in her stool.  She states she has gained a few pounds over the last several days and is concerned she may be having a CHF exacerbation and retaining fluid in her lungs.  I reviewed the past medical records.  The patient was most recently admitted to the hospital service in July with worsening shortness of breath and CHF exacerbation.   Physical Exam   Triage Vital Signs: ED Triage Vitals  Encounter Vitals Group     BP 09/19/23 0957 (!) 134/41     Systolic BP Percentile --      Diastolic BP Percentile --      Pulse Rate 09/19/23 0957 80     Resp 09/19/23 0957 (!) 24     Temp 09/19/23 0957 98 F (36.7 C)     Temp src --      SpO2 09/19/23 0957 98 %     Weight 09/19/23 0956 105 lb (47.6 kg)     Height 09/19/23 0956 5\' 4"  (1.626 m)     Head Circumference --      Peak Flow --      Pain Score 09/19/23 0956 0     Pain Loc --      Pain Education --      Exclude from Growth Chart --     Most recent vital signs: Vitals:   09/19/23 1230 09/19/23 1345  BP: (!) 139/46   Pulse: 85   Resp: (!) 21   Temp:  (!) 100.6 F (38.1 C)  SpO2: 100%      General: Awake, no distress.  CV:  Good peripheral perfusion.  Resp:  Normal effort.  Faint rales left base, otherwise clear lungs. Abd:  No distention.  Other:  No peripheral edema.   ED Results / Procedures / Treatments   Labs (all labs ordered  are listed, but only abnormal results are displayed) Labs Reviewed  BASIC METABOLIC PANEL - Abnormal; Notable for the following components:      Result Value   Sodium 134 (*)    CO2 20 (*)    Glucose, Bld 105 (*)    BUN 45 (*)    Creatinine, Ser 1.76 (*)    Calcium 8.3 (*)    GFR, Estimated 28 (*)    All other components within normal limits  CBC - Abnormal; Notable for the following components:   WBC 63.2 (*)    RBC 1.63 (*)    Hemoglobin 5.7 (*)    HCT 19.0 (*)    MCV 116.6 (*)    MCH 35.0 (*)    RDW 16.7 (*)    All other components within normal limits  BRAIN NATRIURETIC PEPTIDE - Abnormal; Notable for the following components:   B Natriuretic Peptide 1,417.2 (*)    All other components within normal limits  TROPONIN  I (HIGH SENSITIVITY) - Abnormal; Notable for the following components:   Troponin I (High Sensitivity) 38 (*)    All other components within normal limits  TROPONIN I (HIGH SENSITIVITY) - Abnormal; Notable for the following components:   Troponin I (High Sensitivity) 40 (*)    All other components within normal limits  RESP PANEL BY RT-PCR (RSV, FLU A&B, COVID)  [id number]  HEMOGLOBIN AND HEMATOCRIT, BLOOD  PREPARE RBC (CROSSMATCH)  TYPE AND SCREEN     EKG  ED ECG REPORT I, Dionne Bucy, the attending physician, personally viewed and interpreted this ECG.  Date: 09/19/2023 EKG Time: 1000 Rate: 80 Rhythm: normal sinus rhythm QRS Axis: Left axis Intervals: LBBB ST/T Wave abnormalities: normal Narrative Interpretation: no evidence of acute ischemia no significant change when compared to EKG of 06/18/2023    RADIOLOGY  Chest x-ray: I independently viewed and interpreted the images; there are trace effusions versus atelectasis, but no focal consolidation or edema  PROCEDURES:  Critical Care performed: No  Procedures   MEDICATIONS ORDERED IN ED: Medications  0.9 %  sodium chloride infusion (has no administration in time range)   acetaminophen (TYLENOL) tablet 650 mg (has no administration in time range)    Or  acetaminophen (TYLENOL) suppository 650 mg (has no administration in time range)  ondansetron (ZOFRAN) tablet 4 mg (has no administration in time range)    Or  ondansetron (ZOFRAN) injection 4 mg (has no administration in time range)  senna-docusate (Senokot-S) tablet 1 tablet (has no administration in time range)  HYDROcodone-acetaminophen (NORCO) 10-325 MG per tablet 1 tablet (has no administration in time range)  HYDROcodone-acetaminophen (NORCO/VICODIN) 5-325 MG per tablet 1 tablet (1 tablet Oral Given 09/19/23 1312)     IMPRESSION / MDM / ASSESSMENT AND PLAN / ED COURSE  I reviewed the triage vital signs and the nursing notes.  89 year old female with PMH as noted above presents with increased shortness of breath acute onset today associated with some weight gain but not with any edema.  On exam she is hypertensive with otherwise normal vital signs.  O2 saturation is in the high 90s on room air.  She does not demonstrate any respiratory distress.  There are faint rales in the lung base on the left.  Differential diagnosis includes, but is not limited to, CHF exacerbation, other fluid overload, acute bronchitis, pneumonia, COVID-19, other viral syndrome.  We will obtain chest x-ray, lab workup, and reassess.  Patient's presentation is most consistent with acute presentation with potential threat to life or bodily function.  The patient is on the cardiac monitor to evaluate for evidence of arrhythmia and/or significant heart rate changes.  ----------------------------------------- 1:11 PM on 09/19/2023 -----------------------------------------  Lab workup is significant for anemia with a hemoglobin of 5.7 which is below the patient's baseline around 8.  I suspect that this is the primary reason for her shortness of breath.  BNP is elevated, but the chest x-ray does not show significant edema.   Respiratory panel is negative.  CBC is also significant for WBC count of 63 which is consistent with the patient's prior labs and likely related to her CLL.  The patient has had transfusions before and has no dark stools or any abnormal bleeding.  I have ordered a unit of PRBCs.  I consulted Dr. Sedalia Muta from the hospitalist service; based on her discussion she agrees to evaluate the patient for admission.  FINAL CLINICAL IMPRESSION(S) / ED DIAGNOSES   Final diagnoses:  Anemia, unspecified type  Rx / DC Orders   ED Discharge Orders     None        Note:  This document was prepared using Dragon voice recognition software and may include unintentional dictation errors.    Dionne Bucy, MD 09/19/23 [phone]

## 2023-09-19 NOTE — H&P (Addendum)
History and Physical   Lindsay [name] ZOX:[id number] DOB: 1934-03-26 DOA: 09/19/2023  PCP: Jerl Mina, MD  Outpatient Specialists: Dr. Donneta Romberg, medical oncology/hematology Patient coming from: Home via POV  I have personally briefly reviewed patient's old medical records in [facility] EMR.  Chief Concern: Shortness of breath and weight gain  HPI: Ms. Lindsay [name] is a 89 year old female with history of hypertension, CLL, irritable bowel syndrome, history of macrocytic anemia, depression, anxiety, moderate protein calorie malnutrition, history of symptomatic anemia requiring blood transfusion, combined heart failure, who presents emergency department for chief concerns of shortness of breath.  Vitals in the ED showed temperature of 98, respiration rate 24, heart rate of 80, blood pressure 134/41, SpO2 of 98% on room air.  Serum sodium is 136, potassium 4.8, chloride 107, bicarb 20, BUN of 45, serum creatinine 1.76, eGFR 28, nonfasting blood glucose 105, WBC 63.2, hemoglobin 5.7, platelets of 188.  ED treatment: Hydrocodone 5-325 mg p.o. one-time dose, prepare RBC crossmatch, type and screen, and 1 unit of blood transfusion. --------------------------------- At bedside, patient was able to tell me her first hand last name, the current location, and her age initially was 84.  I corrected her and states that she is 45.  She was not able to tell me the current calendar year or the current month.  She reports she has been short of breath for about 1 week that is more than her normal baseline.  She reports that shortness of breath is worse with exertion such as sweeping the floor, walking around the house.  She reports she can still do dishes fine.  Social history: She lives at home.  She denies tobacco, EtOH, recreational drug use.  She is retired.  ROS: Constitutional: no weight change, no fever ENT/Mouth: no sore throat, no rhinorrhea Eyes: no eye pain, no vision  changes Cardiovascular: no chest pain, + dyspnea,  no edema, no palpitations Respiratory: no cough, no sputum, no wheezing Gastrointestinal: no nausea, no vomiting, no diarrhea, no constipation Genitourinary: no urinary incontinence, no dysuria, no hematuria Musculoskeletal: no arthralgias, no myalgias Skin: no skin lesions, no pruritus, Neuro: + weakness, no loss of consciousness, no syncope Psych: no anxiety, no depression, no decrease appetite Heme/Lymph: no bruising, no bleeding  ED Course: Discussed with emergency medicine provider, patient requiring hospitalization for chief concerns of symptomatic anemia.  Assessment/Plan  Principal Problem:   Symptomatic anemia Active Problems:   CLL (chronic lymphocytic leukemia) (HCC)   Essential hypertension   Chronic kidney disease, stage 3a (HCC)   Depression   Fibromyalgia   IBS (irritable bowel syndrome)   Increase in serum creatinine from prior measurement   DNR no code (do not resuscitate)   Assessment and Plan:  * Symptomatic anemia Status post type and screen, crossmatch, and 1 unit of PRBC ordered for transfusion via EDP Patient baseline hemoglobin is 7.3-9.0 Scheduled time hemoglobin/hematocrit recheck on day of admission at 1900 hrs. Goal hemoglobin greater than 7 on admission  Essential hypertension Home Entresto not resumed on admission due to mild elevation in serum creatinine; AM team to resume when the benefits outweigh the risks Home furosemide 20 mg daily resumed for 09/20/2023 Hydralazine 5 mg IV every 6 hours as needed for SBP greater than 165, 5 days ordered  Depression Home amitriptyline 20 mg nightly and duloxetine 20 mg daily resumed  Increase in serum creatinine from prior measurement Mild decrease in eGFR, on admission was 28 Does not meet criteria for acute kidney injury  Fibromyalgia PDMP  reviewed  Chart reviewed.   DVT prophylaxis: TED hose Code Status: DNR/DNI Diet: Heart healthy Family  Communication: A phone call was offered, patient declined stating that her daughter dropped her off at the hospital and knows she is being admitted. Disposition Plan: Pending clinical course Consults called: None at this time Admission status: Telemetry medical, inpatient  Past Medical History:  Diagnosis Date   Arthritis    RHEUMATOID   CHF (congestive heart failure) (HCC)    CLL (chronic lymphocytic leukemia) (HCC)    Edema    MILD ANKLE OCCAS   Fibromyalgia    GERD (gastroesophageal reflux disease)    Hypertension    Kidney stones    RLS (restless legs syndrome)    Past Surgical History:  Procedure Laterality Date   ABDOMINAL HYSTERECTOMY     CATARACT EXTRACTION W/PHACO Left 02/07/2016   Procedure: CATARACT EXTRACTION PHACO AND INTRAOCULAR LENS PLACEMENT (IOC);  Surgeon: Sallee Lange, MD;  Location: [facility] ORS;  Service: Ophthalmology;  Laterality: Left;  [place] 01:33 AP% 24.7 CDE 39.57 fluid pack lot # [id number]   EXTRACORPOREAL SHOCK WAVE LITHOTRIPSY Right 07/03/2019   Procedure: EXTRACORPOREAL SHOCK WAVE LITHOTRIPSY (ESWL);  Surgeon: Orson Ape, MD;  Location: [facility] ORS;  Service: Urology;  Laterality: Right;   EYE SURGERY     HIP ARTHROPLASTY Left 04/20/2020   Procedure: ARTHROPLASTY BIPOLAR HIP (HEMIARTHROPLASTY);  Surgeon: Kennedy Bucker, MD;  Location: [facility] ORS;  Service: Orthopedics;  Laterality: Left;   LITHOTRIPSY     TONSILLECTOMY     Social History:  reports that she has never smoked. She has never used smokeless tobacco. She reports that she does not drink alcohol and does not use drugs.  Allergies  Allergen Reactions   Gabapentin Swelling    05/01/23: Pt stated she has facial swelling when taking Gabapentin.   Demerol [Meperidine]    Family History  Problem Relation Age of Onset   Cancer Mother        uterine cancer   Family history: Family history reviewed and not pertinent.  Prior to Admission medications   Medication Sig Start Date End Date Taking?  Authorizing Provider  amitriptyline (ELAVIL) 10 MG tablet Take 20 mg by mouth at bedtime.   Yes [provider]  cyanocobalamin (VITAMIN B12) 1000 MCG tablet Take 1,000 mcg by mouth daily.   Yes [provider]  DULoxetine (CYMBALTA) 20 MG capsule Take 20 mg by mouth daily. 04/09/23 04/08/24 Yes [provider]  furosemide (LASIX) 20 MG tablet Take 2 tablets (40 mg total) by mouth daily. 06/19/23 06/18/24 Yes Enedina Finner, MD  HYDROcodone-acetaminophen (NORCO) 10-325 MG tablet Take 1 tablet by mouth every 6 (six) hours as needed.   Yes [provider]  sacubitril-valsartan (ENTRESTO) 24-26 MG Take 1 tablet by mouth 2 (two) times daily.   Yes [provider]  aspirin EC 81 MG tablet Take 1 tablet (81 mg total) by mouth daily. Swallow whole. Patient not taking: Reported on 08/10/2023 06/20/23   Enedina Finner, MD  cyanocobalamin (,VITAMIN B-12,) 1000 MCG/ML injection Inject into the muscle every 14 (fourteen) days. 07/04/21   [provider]  feeding supplement, ENSURE ENLIVE, (ENSURE ENLIVE) LIQD Take 237 mLs by mouth 2 (two) times daily between meals. 04/22/20   Swayze, Ava, DO   Physical Exam: Vitals:   09/19/23 1130 09/19/23 1200 09/19/23 1230 09/19/23 1345  BP: (!) 141/45 (!) 135/57 (!) 139/46   Pulse: 79 79 85   Resp: (!) 27 (!) 28 (!) 21  Temp:    (!) 100.6 F (38.1 C)  TempSrc:    Oral  SpO2: 100% 95% 100%   Weight:      Height:       Constitutional: appears age-appropriate, frail, NAD, calm Eyes: PERRL, lids and conjunctivae normal ENMT: Mucous membranes are moist. Posterior pharynx clear of any exudate or lesions. Age-appropriate dentition. Hearing appropriate Neck: normal, supple, no masses, no thyromegaly Respiratory: clear to auscultation bilaterally, no wheezing, no crackles. Normal respiratory effort. No accessory muscle use.  Cardiovascular: Regular rate and rhythm, no murmurs / rubs / gallops. No extremity edema. 2+ pedal pulses. No  carotid bruits.  Abdomen: no tenderness, no masses palpated, no hepatosplenomegaly. Bowel sounds positive.  Musculoskeletal: no clubbing / cyanosis. No joint deformity upper and lower extremities. Good ROM, no contractures, no atrophy. Normal muscle tone.  Skin: no rashes, lesions, ulcers. No induration, pale Neurologic: Sensation intact. Strength 5/5 in all 4.  Psychiatric: Normal judgment and insight. Alert and oriented x self, age, location. Normal mood.   EKG: independently reviewed, showing sinus rhythm with rate of 80, QTc 498  Chest x-ray on Admission: I personally reviewed and I agree with radiologist reading as below.  DG Chest 2 View  Result Date: 09/19/2023 CLINICAL DATA:  Shortness of breath EXAM: CHEST - 2 VIEW COMPARISON:  Chest radiograph dated 06/18/2023 FINDINGS: Normal lung volumes. Patchy basilar opacities. Trace blunting of bilateral costophrenic angles. No pneumothorax. Similar mildly enlarged cardiomediastinal silhouette. No acute osseous abnormality. IMPRESSION: Trace blunting of bilateral costophrenic angles, which may represent trace pleural effusions or atelectasis. Electronically Signed   By: Agustin Cree M.D.   On: 09/19/2023 11:14    Labs on Admission: I have personally reviewed following labs CBC: Recent Labs  Lab 09/19/23 1036  WBC 63.2*  HGB 5.7*  HCT 19.0*  MCV 116.6*  PLT 188   Basic Metabolic Panel: Recent Labs  Lab 09/19/23 1036  NA 134*  K 4.8  CL 107  CO2 20*  GLUCOSE 105*  BUN 45*  CREATININE 1.76*  CALCIUM 8.3*   GFR: Estimated Creatinine Clearance: 16.9 mL/min (A) (by C-G formula based on SCr of 1.76 mg/dL (H)).  Urine analysis:    Component Value Date/Time   COLORURINE STRAW (A) 07/16/2022 2316   APPEARANCEUR CLEAR (A) 07/16/2022 2316   APPEARANCEUR Clear 03/06/2014 2053   LABSPEC 1.008 07/16/2022 2316   LABSPEC 1.008 03/06/2014 2053   PHURINE 5.0 07/16/2022 2316   GLUCOSEU NEGATIVE 07/16/2022 2316   GLUCOSEU Negative  03/06/2014 2053   HGBUR NEGATIVE 07/16/2022 2316   BILIRUBINUR NEGATIVE 07/16/2022 2316   BILIRUBINUR Negative 03/06/2014 2053   KETONESUR NEGATIVE 07/16/2022 2316   PROTEINUR NEGATIVE 07/16/2022 2316   NITRITE NEGATIVE 07/16/2022 2316   LEUKOCYTESUR NEGATIVE 07/16/2022 2316   LEUKOCYTESUR Negative 03/06/2014 2053   This document was prepared using Dragon Voice Recognition software and may include unintentional dictation errors.  Dr. Sedalia Muta [facility]  If 7PM-7AM, please contact overnight-coverage provider If 7AM-7PM, please contact day attending provider [email or web address]  09/19/2023, 3:38 PM

## 2023-09-19 NOTE — ED Triage Notes (Addendum)
Pt via POV from home. Pt c/o SOB starting last night. Pt has a hx of CHF and reports that she has gained a couple of extra pounds since yesterday but denies noticing any actual swelling. Reports SOB is worse with movement. Report she did have some CP and cough this morning but denies now. Pt is A&Ox4 and NAD

## 2023-09-19 NOTE — Progress Notes (Addendum)
[facility] Progress Note  Received message from blood bank at approximately 2:44 PM stating that patient's type and screen had to be sent to [facility] for phenotypic matching.  Given that it was sent to [facility], he is aware would take longer than 2 hours.  Sent message via secure chat to nursing at approximately 7:05p requesting update as to whether patient has received blood.  # Symptomatic anemia in setting of CLL with frequent blood transfusion Hemoglobin > 7 - RN and cross coverage made aware  Dr. Sedalia Muta

## 2023-09-19 NOTE — Assessment & Plan Note (Signed)
PDMP reviewed.

## 2023-09-20 DIAGNOSIS — D649 Anemia, unspecified: Secondary | ICD-10-CM | POA: Diagnosis not present

## 2023-09-20 LAB — CBC
HCT: 15.5 % — ABNORMAL LOW (ref 36.0–46.0)
Hemoglobin: 4.7 g/dL — CL (ref 12.0–15.0)
MCH: 35.1 pg — ABNORMAL HIGH (ref 26.0–34.0)
MCHC: 30.3 g/dL (ref 30.0–36.0)
MCV: 115.7 fL — ABNORMAL HIGH (ref 80.0–100.0)
Platelets: 156 10*3/uL (ref 150–400)
RBC: 1.34 MIL/uL — ABNORMAL LOW (ref 3.87–5.11)
RDW: 16.4 % — ABNORMAL HIGH (ref 11.5–15.5)
WBC: 55.6 10*3/uL (ref 4.0–10.5)
nRBC: 0 % (ref 0.0–0.2)

## 2023-09-20 LAB — IRON AND TIBC
Iron: 27 ug/dL — ABNORMAL LOW (ref 28–170)
Saturation Ratios: 11 % (ref 10.4–31.8)
TIBC: 239 ug/dL — ABNORMAL LOW (ref 250–450)
UIBC: 212 ug/dL

## 2023-09-20 LAB — BASIC METABOLIC PANEL
Anion gap: 9 (ref 5–15)
BUN: 43 mg/dL — ABNORMAL HIGH (ref 8–23)
CO2: 21 mmol/L — ABNORMAL LOW (ref 22–32)
Calcium: 8.1 mg/dL — ABNORMAL LOW (ref 8.9–10.3)
Chloride: 106 mmol/L (ref 98–111)
Creatinine, Ser: 1.67 mg/dL — ABNORMAL HIGH (ref 0.44–1.00)
GFR, Estimated: 29 mL/min — ABNORMAL LOW (ref >60)
Glucose, Bld: 109 mg/dL — ABNORMAL HIGH (ref 70–99)
Potassium: 4.5 mmol/L (ref 3.5–5.1)
Sodium: 136 mmol/L (ref 135–145)

## 2023-09-20 LAB — HEMOGLOBIN AND HEMATOCRIT, BLOOD
HCT: 28.2 % — ABNORMAL LOW (ref 36.0–46.0)
Hemoglobin: 9.3 g/dL — ABNORMAL LOW (ref 12.0–15.0)

## 2023-09-20 LAB — PREPARE RBC (CROSSMATCH)

## 2023-09-20 LAB — FERRITIN: Ferritin: 40 ng/mL (ref 11–307)

## 2023-09-20 MED ORDER — FUROSEMIDE 10 MG/ML IJ SOLN
20.0000 mg | Freq: Once | INTRAMUSCULAR | Status: AC
  Administered 2023-09-20: 20 mg via INTRAVENOUS
  Filled 2023-09-20: qty 2

## 2023-09-20 MED ORDER — SODIUM CHLORIDE 0.9% IV SOLUTION: Freq: Once | INTRAVENOUS | Status: AC

## 2023-09-20 NOTE — Progress Notes (Signed)
PROGRESS NOTE    [name]   WUJ:[id number] DOB: [birth year]  DOA: 09/19/2023 Date of Service: 09/20/23 which is hospital day 1  PCP: Jerl Mina, MD    HPI: Ms. Lindsay [name] is a 89 year old female with history of hypertension, CLL, irritable bowel syndrome, history of macrocytic anemia, depression, anxiety, moderate protein calorie malnutrition, history of symptomatic anemia requiring blood transfusion, combined heart failure, who presents emergency department for chief concerns of shortness of breath.  Hospital course / significant events:  10/02: to ED. Hgb 5.7 - 1 unit PRBC ordered, pending transfusion d/t special phenotypic matching needed.  10/03: this AM awaiting transfusion - getting 2 units now. Rales on exam, will give IV lasix x1 following transfusion and try ambulating as well. Monitor overnight and if CBC ok in AM will plan on discharge   Consultants:  none  Procedures/Surgeries: none      ASSESSMENT & PLAN:   Symptomatic anemia baseline hemoglobin is 7.3-9.0 S/p type and screen, crossmatch, and 1 unit of PRBC ordered for transfusion via EDP - has been delayed d/t requiring phenotypic matching, send-out to [place] but has finally been able to start running  Follow CBC/HH   Essential hypertension Home Entresto not resumed on admission due to mild elevation in serum creatinine; AM team to resume when the benefits outweigh the risks Home furosemide 20 mg daily resumed for 09/20/2023   Depression Fibromyalgia Home amitriptyline 20 mg nightly and duloxetine 20 mg daily resumed   Increase in serum creatinine from prior measurement, does not meet criteria for acute kidney injury CKD3b / borderline or potentially evolving to CKD4 GFR/Cr variable over record review  Monitor BMP Avoid nephrotoxins as able  Chronic HFrEF  Echo 06/19/23: EF 40-45%, Grade II Diastolic df Hospitalized for exacerbation 06/2023 Continue Lasix 40 mg po daily Lasix IV x1  today following transfusion given SOB/rales Restart Entresto lower dose given borderline BP   SIRS without infection Likely d/t anemia in setting of CLL  Tachypnea RR max 28, Tmax 100.6, WBC elevated d/t CLL Monitor VS, symptoms   Concern for malnutrition/underweight based on BMI: Body mass index is 18.02 kg/m.   DVT prophylaxis: hold Rx ppx d/t anemia, SCD ok  IV fluids: avoid continuous IV fluids given CHF hx Nutrition: cardiac diet  Central lines / invasive devices: none  Code Status: DNR ACP documentation reviewed: 09/20/23 and none on file in [facility]  TOC needs: TBD but expect back to previous home environment, consider PT/OT eval pending PRBC transfusion  Barriers to dispo / significant pending items: transfusion delayed see above, concern possible pulm edema w/ rales, monitro closely following PRBC              Subjective / Brief ROS:  Patient reports feeling a bit tired still Hasnt been OOB Denies CP Reports some SOB. No LE edema  Pain controlled.  Denies new weakness.  Tolerating diet.  Reports no concerns w/ urination/defecation.   Family Communication: daughter at bedside on rounds     Objective Findings:  Vitals:   09/20/23 0725 09/20/23 0915 09/20/23 1011 09/20/23 1026  BP: 123/70 (!) 128/44 (!) 128/44 (!) 132/42  Pulse: 74 75 75 71  Resp: 16 16 16 18   Temp: 97.6 F (36.4 C) 98 F (36.7 C) 98 F (36.7 C) 98 F (36.7 C)  TempSrc: Oral Oral Oral Oral  SpO2: 100% 100% 100% 100%  Weight:      Height:        Intake/Output Summary (Last  24 hours) at 09/20/2023 1332 Last data filed at 09/20/2023 0915 Gross per 24 hour  Intake 1154.67 ml  Output --  Net 1154.67 ml   Filed Weights   09/19/23 0956  Weight: 47.6 kg    Examination:  Physical Exam Constitutional:      General: She is not in acute distress.    Appearance: She is not ill-appearing.  Cardiovascular:     Rate and Rhythm: Normal rate and regular rhythm.     Heart sounds:  Murmur heard.  Pulmonary:     Effort: Pulmonary effort is normal.     Breath sounds: Rales present.  Musculoskeletal:     Right lower leg: No edema.     Left lower leg: No edema.  Skin:    General: Skin is warm and dry.     Coloration: Skin is pale.  Neurological:     General: No focal deficit present.     Mental Status: She is alert and oriented to person, place, and time.  Psychiatric:        Mood and Affect: Mood normal.        Behavior: Behavior normal.          Scheduled Medications:   amitriptyline  20 mg Oral QHS   cyanocobalamin  1,000 mcg Oral Daily   DULoxetine  20 mg Oral Daily   feeding supplement  237 mL Oral BID BM   furosemide  20 mg Intravenous Once   furosemide  40 mg Oral Daily    Continuous Infusions:  sodium chloride      PRN Medications:  acetaminophen **OR** acetaminophen, hydrALAZINE, HYDROcodone-acetaminophen, ondansetron **OR** ondansetron (ZOFRAN) IV, senna-docusate  Antimicrobials from admission:  Anti-infectives (From admission, onward)    None           Data Reviewed:  I have personally reviewed the following...  CBC: Recent Labs  Lab 09/19/23 1036 09/19/23 1842 09/20/23 0250  WBC 63.2*  --  55.6*  HGB 5.7* 5.3* 4.7*  HCT 19.0* 17.2* 15.5*  MCV 116.6*  --  115.7*  PLT 188  --  156   Basic Metabolic Panel: Recent Labs  Lab 09/19/23 1036 09/20/23 0250  NA 134* 136  K 4.8 4.5  CL 107 106  CO2 20* 21*  GLUCOSE 105* 109*  BUN 45* 43*  CREATININE 1.76* 1.67*  CALCIUM 8.3* 8.1*   GFR: Estimated Creatinine Clearance: 17.8 mL/min (A) (by C-G formula based on SCr of 1.67 mg/dL (H)). Liver Function Tests: No results for input(s): "AST", "ALT", "ALKPHOS", "BILITOT", "PROT", "ALBUMIN" in the last 168 hours. No results for input(s): "LIPASE", "AMYLASE" in the last 168 hours. No results for input(s): "AMMONIA" in the last 168 hours. Coagulation Profile: No results for input(s): "INR", "PROTIME" in the last 168  hours. Cardiac Enzymes: No results for input(s): "CKTOTAL", "CKMB", "CKMBINDEX", "TROPONINI" in the last 168 hours. BNP (last 3 results) No results for input(s): "PROBNP" in the last 8760 hours. HbA1C: No results for input(s): "HGBA1C" in the last 72 hours. CBG: No results for input(s): "GLUCAP" in the last 168 hours. Lipid Profile: No results for input(s): "CHOL", "HDL", "LDLCALC", "TRIG", "CHOLHDL", "LDLDIRECT" in the last 72 hours. Thyroid Function Tests: No results for input(s): "TSH", "T4TOTAL", "FREET4", "T3FREE", "THYROIDAB" in the last 72 hours. Anemia Panel: Recent Labs    09/20/23 0250  FERRITIN 40  TIBC 239*  IRON 27*   Most Recent Urinalysis On File:     Component Value Date/Time   COLORURINE STRAW (A)  07/16/2022 2316   APPEARANCEUR CLEAR (A) 07/16/2022 2316   APPEARANCEUR Clear 03/06/2014 2053   LABSPEC 1.008 07/16/2022 2316   LABSPEC 1.008 03/06/2014 2053   PHURINE 5.0 07/16/2022 2316   GLUCOSEU NEGATIVE 07/16/2022 2316   GLUCOSEU Negative 03/06/2014 2053   HGBUR NEGATIVE 07/16/2022 2316   BILIRUBINUR NEGATIVE 07/16/2022 2316   BILIRUBINUR Negative 03/06/2014 2053   KETONESUR NEGATIVE 07/16/2022 2316   PROTEINUR NEGATIVE 07/16/2022 2316   NITRITE NEGATIVE 07/16/2022 2316   LEUKOCYTESUR NEGATIVE 07/16/2022 2316   LEUKOCYTESUR Negative 03/06/2014 2053   Sepsis Labs: @LABRCNTIP (procalcitonin:4,lacticidven:4) Microbiology: Recent Results (from the past 240 hour(s))  Resp panel by RT-PCR (RSV, Flu A&B, Covid) Anterior Nasal Swab     Status: None   Collection Time: 09/19/23 10:53 AM   Specimen: Anterior Nasal Swab  Result Value Ref Range Status   SARS Coronavirus 2 by RT PCR NEGATIVE NEGATIVE Final    Comment: (NOTE) SARS-CoV-2 target nucleic acids are NOT DETECTED.  The SARS-CoV-2 RNA is generally detectable in upper respiratory specimens during the acute phase of infection. The lowest concentration of SARS-CoV-2 viral copies this assay can detect  is 138 copies/mL. A negative result does not preclude SARS-Cov-2 infection and should not be used as the sole basis for treatment or other patient management decisions. A negative result may occur with  improper specimen collection/handling, submission of specimen other than nasopharyngeal swab, presence of viral mutation(s) within the areas targeted by this assay, and inadequate number of viral copies(<138 copies/mL). A negative result must be combined with clinical observations, patient history, and epidemiological information. The expected result is Negative.  Fact Sheet for Patients:  BloggerCourse.com  Fact Sheet for Healthcare Providers:  SeriousBroker.it  This test is no t yet approved or cleared by the [place] FDA and  has been authorized for detection and/or diagnosis of SARS-CoV-2 by FDA under an Emergency Use Authorization (EUA). This EUA will remain  in effect (meaning this test can be used) for the duration of the COVID-19 declaration under Section 564(b)(1) of the Act, 21 U.S.C.section 360bbb-3(b)(1), unless the authorization is terminated  or revoked sooner.       Influenza A by PCR NEGATIVE NEGATIVE Final   Influenza B by PCR NEGATIVE NEGATIVE Final    Comment: (NOTE) The Xpert Xpress SARS-CoV-2/FLU/RSV plus assay is intended as an aid in the diagnosis of influenza from Nasopharyngeal swab specimens and should not be used as a sole basis for treatment. Nasal washings and aspirates are unacceptable for Xpert Xpress SARS-CoV-2/FLU/RSV testing.  Fact Sheet for Patients: BloggerCourse.com  Fact Sheet for Healthcare Providers: SeriousBroker.it  This test is not yet approved or cleared by the [place] FDA and has been authorized for detection and/or diagnosis of SARS-CoV-2 by FDA under an Emergency Use Authorization (EUA). This EUA will remain in effect  (meaning this test can be used) for the duration of the COVID-19 declaration under Section 564(b)(1) of the Act, 21 U.S.C. section 360bbb-3(b)(1), unless the authorization is terminated or revoked.     Resp Syncytial Virus by PCR NEGATIVE NEGATIVE Final    Comment: (NOTE) Fact Sheet for Patients: BloggerCourse.com  Fact Sheet for Healthcare Providers: SeriousBroker.it  This test is not yet approved or cleared by the [place] FDA and has been authorized for detection and/or diagnosis of SARS-CoV-2 by FDA under an Emergency Use Authorization (EUA). This EUA will remain in effect (meaning this test can be used) for the duration of the COVID-19 declaration under Section 564(b)(1) of the Act,  21 U.S.C. section 360bbb-3(b)(1), unless the authorization is terminated or revoked.  Performed at [facility], [address] [place], [place] [address]       Radiology Studies last 3 days: DG Chest 2 View  Result Date: 09/19/2023 CLINICAL DATA:  Shortness of breath EXAM: CHEST - 2 VIEW COMPARISON:  Chest radiograph dated 06/18/2023 FINDINGS: Normal lung volumes. Patchy basilar opacities. Trace blunting of bilateral costophrenic angles. No pneumothorax. Similar mildly enlarged cardiomediastinal silhouette. No acute osseous abnormality. IMPRESSION: Trace blunting of bilateral costophrenic angles, which may represent trace pleural effusions or atelectasis. Electronically Signed   By: Agustin Cree M.D.   On: 09/19/2023 11:14          Sunnie Nielsen, DO [facility] 09/20/2023, 1:32 PM    Dictation software may have been used to generate the above note. Typos may occur and escape review in typed/dictated notes. Please contact Dr Lyn Hollingshead directly for clarity if needed.  Staff may message me via secure chat in Epic  but this may not receive an immediate response,  please page me for urgent matters!  If 7PM-7AM,  please contact night coverage [email or web address]

## 2023-09-20 NOTE — Plan of Care (Signed)

## 2023-09-21 ENCOUNTER — Telehealth: Payer: Self-pay | Admitting: Family

## 2023-09-21 DIAGNOSIS — D649 Anemia, unspecified: Secondary | ICD-10-CM | POA: Diagnosis not present

## 2023-09-21 LAB — CBC
HCT: 24.3 % — ABNORMAL LOW (ref 36.0–46.0)
Hemoglobin: 8.1 g/dL — ABNORMAL LOW (ref 12.0–15.0)
MCH: 31.8 pg (ref 26.0–34.0)
MCHC: 33.3 g/dL (ref 30.0–36.0)
MCV: 95.3 fL (ref 80.0–100.0)
Platelets: 154 10*3/uL (ref 150–400)
RBC: 2.55 MIL/uL — ABNORMAL LOW (ref 3.87–5.11)
RDW: 25.5 % — ABNORMAL HIGH (ref 11.5–15.5)
WBC: 62.5 10*3/uL (ref 4.0–10.5)
nRBC: 0 % (ref 0.0–0.2)

## 2023-09-21 LAB — BASIC METABOLIC PANEL
Anion gap: 8 (ref 5–15)
BUN: 42 mg/dL — ABNORMAL HIGH (ref 8–23)
CO2: 21 mmol/L — ABNORMAL LOW (ref 22–32)
Calcium: 8.1 mg/dL — ABNORMAL LOW (ref 8.9–10.3)
Chloride: 109 mmol/L (ref 98–111)
Creatinine, Ser: 1.59 mg/dL — ABNORMAL HIGH (ref 0.44–1.00)
GFR, Estimated: 31 mL/min — ABNORMAL LOW (ref >60)
Glucose, Bld: 95 mg/dL (ref 70–99)
Potassium: 3.8 mmol/L (ref 3.5–5.1)
Sodium: 138 mmol/L (ref 135–145)

## 2023-09-21 LAB — LACTATE DEHYDROGENASE: LDH: 122 U/L (ref 98–192)

## 2023-09-21 NOTE — Progress Notes (Signed)
Per Dr Sheppard Coil, dc tele monitoring

## 2023-09-21 NOTE — Plan of Care (Signed)

## 2023-09-21 NOTE — Care Management Important Message (Signed)
Important Message  Patient Details  Name: Lindsay [name] MRN: [id number] Date of Birth: Jul 08, 1934   Important Message Given:  Yes - Medicare IM     Johnell Comings 09/21/2023, 10:46 AM

## 2023-09-22 NOTE — Discharge Summary (Signed)
Physician Discharge Summary   Patient: Lindsay [name] MRN: [id number]  DOB: [birth year]   Admit:     Date of Admission: 09/19/2023 Admitted from: home   Discharge: Date of discharge: 09/21/2023 Disposition: Home health Condition at discharge: good  CODE STATUS: FULL CODE     Discharge Physician: Sunnie Nielsen, DO [facility]     PCP: Jerl Mina, MD  Recommendations for Outpatient Follow-up:  Follow up with PCP Jerl Mina, MD in 1-2 weeks Follow up with oncology - their ofice should call     Discharge Instructions     Diet - low sodium heart healthy   Complete by: As directed    Discharge instructions   Complete by: As directed    You should get a call from Dr. Sharlette Dense office about following up for the anemia, etc. If yo don't hear from them by Monday morning, please call them on Monday afternoon to confirm follow-up.  We are holding aspirin because of the anemia.  We are holding Entresto because of low blood pressure.  Stay off these medicines unless restarted by Dr Donneta Romberg or your PCP   Increase activity slowly   Complete by: As directed          Discharge Diagnoses: Principal Problem:   Symptomatic anemia Active Problems:   CLL (chronic lymphocytic leukemia) (HCC)   Essential hypertension   CKD stage 3b, GFR 30-44 ml/min (HCC)   Depression   Fibromyalgia   IBS (irritable bowel syndrome)   Increase in serum creatinine from prior measurement   DNR no code (do not resuscitate)       Hospital Course:  HPI: Lindsay [name] is a 89 year old female with history of hypertension, CLL, irritable bowel syndrome, history of macrocytic anemia, depression, anxiety, moderate protein calorie malnutrition, history of symptomatic anemia requiring blood transfusion, combined heart failure, who presents emergency department for chief concerns of shortness of breath.  Hospital course / significant events:  10/02: to ED. Hgb 5.7 -  1 unit PRBC ordered, pending transfusion d/t special phenotypic matching needed.  10/03: this AM awaiting transfusion - getting 2 units now. Rales on exam, will give IV lasix x1 following transfusion and try ambulating as well. Monitor overnight and if CBC ok in AM will plan on discharge  10/04: feeling well this morning, ambulating, Hgb stable   Consultants:  none  Procedures/Surgeries: none      ASSESSMENT & PLAN:   Symptomatic anemia baseline hemoglobin is 7.3-9.0 S/p 2 unit of PRBC  Follow CBC/HH Hem/Onc f/u outpatient - I confirmed via secure chat w/ Dr Donneta Romberg and his office will arrange f/u   Essential hypertension Home meds as below    Depression Fibromyalgia Home amitriptyline 20 mg nightly and duloxetine 20 mg daily resumed   Increase in serum creatinine from prior measurement, does not meet criteria for acute kidney injury CKD3b / borderline or potentially evolving to CKD4 GFR/Cr variable over record review  Monitor BMP outpatient  Avoid nephrotoxins as able  Chronic HFrEF  Echo 06/19/23: EF 40-45%, Grade II Diastolic df Hospitalized for exacerbation 06/2023 Lasix IV x1 following transfusion given SOB/rales --> improvement  Continue Lasix 40 mg po daily Restart Entresto lower dose given borderline BP   SIRS without infection Likely d/t anemia in setting of CLL  Tachypnea RR max 28, Tmax 100.6, WBC elevated d/t CLL Monitor VS, symptoms           Discharge Instructions  Allergies as of 09/21/2023  Reactions   Gabapentin Swelling   05/01/23: Pt stated she has facial swelling when taking Gabapentin.   Demerol [meperidine]         Medication List     STOP taking these medications    aspirin EC 81 MG tablet   Entresto 24-26 MG Generic drug: sacubitril-valsartan       TAKE these medications    amitriptyline 10 MG tablet Commonly known as: ELAVIL Take 20 mg by mouth at bedtime.   cyanocobalamin 1000 MCG tablet Commonly  known as: VITAMIN B12 Take 1,000 mcg by mouth daily.   cyanocobalamin 1000 MCG/ML injection Commonly known as: VITAMIN B12 Inject into the muscle every 14 (fourteen) days.   DULoxetine 20 MG capsule Commonly known as: CYMBALTA Take 20 mg by mouth daily.   feeding supplement Liqd Take 237 mLs by mouth 2 (two) times daily between meals.   furosemide 20 MG tablet Commonly known as: Lasix Take 2 tablets (40 mg total) by mouth daily.   HYDROcodone-acetaminophen 10-325 MG tablet Commonly known as: NORCO Take 1 tablet by mouth every 6 (six) hours as needed.          Allergies  Allergen Reactions   Gabapentin Swelling    05/01/23: Pt stated she has facial swelling when taking Gabapentin.   Demerol [Meperidine]      Subjective: pt reports better energy today, no other concerns at this time    Discharge Exam: BP 125/82 (BP Location: Left Arm)   Pulse 68   Temp 97.6 F (36.4 C) (Oral)   Resp 18   Ht 5\' 4"  (1.626 m)   Wt 47.6 kg   SpO2 99%   BMI 18.02 kg/m  General: Pt is alert, awake, not in acute distress Cardiovascular: RRR, S1/S2 +, no rubs, no gallops Respiratory: CTA bilaterally, no wheezing, no rhonchi Abdominal: Soft, NT, ND, bowel sounds + Extremities: no edema, no cyanosis     The results of significant diagnostics from this hospitalization (including imaging, microbiology, ancillary and laboratory) are listed below for reference.     Microbiology: Recent Results (from the past 240 hour(s))  Resp panel by RT-PCR (RSV, Flu A&B, Covid) Anterior Nasal Swab     Status: None   Collection Time: 09/19/23 10:53 AM   Specimen: Anterior Nasal Swab  Result Value Ref Range Status   SARS Coronavirus 2 by RT PCR NEGATIVE NEGATIVE Final    Comment: (NOTE) SARS-CoV-2 target nucleic acids are NOT DETECTED.  The SARS-CoV-2 RNA is generally detectable in upper respiratory specimens during the acute phase of infection. The lowest concentration of SARS-CoV-2 viral  copies this assay can detect is 138 copies/mL. A negative result does not preclude SARS-Cov-2 infection and should not be used as the sole basis for treatment or other patient management decisions. A negative result may occur with  improper specimen collection/handling, submission of specimen other than nasopharyngeal swab, presence of viral mutation(s) within the areas targeted by this assay, and inadequate number of viral copies(<138 copies/mL). A negative result must be combined with clinical observations, patient history, and epidemiological information. The expected result is Negative.  Fact Sheet for Patients:  BloggerCourse.com  Fact Sheet for Healthcare Providers:  SeriousBroker.it  This test is no t yet approved or cleared by the [place] FDA and  has been authorized for detection and/or diagnosis of SARS-CoV-2 by FDA under an Emergency Use Authorization (EUA). This EUA will remain  in effect (meaning this test can be used) for the duration of the COVID-19  declaration under Section 564(b)(1) of the Act, 21 U.S.C.section 360bbb-3(b)(1), unless the authorization is terminated  or revoked sooner.       Influenza A by PCR NEGATIVE NEGATIVE Final   Influenza B by PCR NEGATIVE NEGATIVE Final    Comment: (NOTE) The Xpert Xpress SARS-CoV-2/FLU/RSV plus assay is intended as an aid in the diagnosis of influenza from Nasopharyngeal swab specimens and should not be used as a sole basis for treatment. Nasal washings and aspirates are unacceptable for Xpert Xpress SARS-CoV-2/FLU/RSV testing.  Fact Sheet for Patients: BloggerCourse.com  Fact Sheet for Healthcare Providers: SeriousBroker.it  This test is not yet approved or cleared by the [place] FDA and has been authorized for detection and/or diagnosis of SARS-CoV-2 by FDA under an Emergency Use Authorization (EUA). This  EUA will remain in effect (meaning this test can be used) for the duration of the COVID-19 declaration under Section 564(b)(1) of the Act, 21 U.S.C. section 360bbb-3(b)(1), unless the authorization is terminated or revoked.     Resp Syncytial Virus by PCR NEGATIVE NEGATIVE Final    Comment: (NOTE) Fact Sheet for Patients: BloggerCourse.com  Fact Sheet for Healthcare Providers: SeriousBroker.it  This test is not yet approved or cleared by the [place] FDA and has been authorized for detection and/or diagnosis of SARS-CoV-2 by FDA under an Emergency Use Authorization (EUA). This EUA will remain in effect (meaning this test can be used) for the duration of the COVID-19 declaration under Section 564(b)(1) of the Act, 21 U.S.C. section 360bbb-3(b)(1), unless the authorization is terminated or revoked.  Performed at [facility], [address] [place], [place] [address]      Labs: BNP (last 3 results) Recent Labs    06/18/23 0631 07/02/23 1142 09/19/23 1036  BNP 1,254.1* 1,055.0* 1,417.2*   Basic Metabolic Panel: Recent Labs  Lab 09/19/23 1036 09/20/23 0250 09/21/23 0526  NA 134* 136 138  K 4.8 4.5 3.8  CL 107 106 109  CO2 20* 21* 21*  GLUCOSE 105* 109* 95  BUN 45* 43* 42*  CREATININE 1.76* 1.67* 1.59*  CALCIUM 8.3* 8.1* 8.1*   Liver Function Tests: No results for input(s): "AST", "ALT", "ALKPHOS", "BILITOT", "PROT", "ALBUMIN" in the last 168 hours. No results for input(s): "LIPASE", "AMYLASE" in the last 168 hours. No results for input(s): "AMMONIA" in the last 168 hours. CBC: Recent Labs  Lab 09/19/23 1036 09/19/23 1842 09/20/23 0250 09/20/23 1529 09/21/23 0526  WBC 63.2*  --  55.6*  --  62.5*  HGB 5.7* 5.3* 4.7* 9.3* 8.1*  HCT 19.0* 17.2* 15.5* 28.2* 24.3*  MCV 116.6*  --  115.7*  --  95.3  PLT 188  --  156  --  154   Cardiac Enzymes: No results for input(s): "CKTOTAL", "CKMB",  "CKMBINDEX", "TROPONINI" in the last 168 hours. BNP: Invalid input(s): "POCBNP" CBG: No results for input(s): "GLUCAP" in the last 168 hours. D-Dimer No results for input(s): "DDIMER" in the last 72 hours. Hgb A1c No results for input(s): "HGBA1C" in the last 72 hours. Lipid Profile No results for input(s): "CHOL", "HDL", "LDLCALC", "TRIG", "CHOLHDL", "LDLDIRECT" in the last 72 hours. Thyroid function studies No results for input(s): "TSH", "T4TOTAL", "T3FREE", "THYROIDAB" in the last 72 hours.  Invalid input(s): "FREET3" Anemia work up Recent Labs    09/20/23 0250  FERRITIN 40  TIBC 239*  IRON 27*   Urinalysis    Component Value Date/Time   COLORURINE STRAW (A) 07/16/2022 2316   APPEARANCEUR CLEAR (A) 07/16/2022 2316  APPEARANCEUR Clear 03/06/2014 2053   LABSPEC 1.008 07/16/2022 2316   LABSPEC 1.008 03/06/2014 2053   PHURINE 5.0 07/16/2022 2316   GLUCOSEU NEGATIVE 07/16/2022 2316   GLUCOSEU Negative 03/06/2014 2053   HGBUR NEGATIVE 07/16/2022 2316   BILIRUBINUR NEGATIVE 07/16/2022 2316   BILIRUBINUR Negative 03/06/2014 2053   KETONESUR NEGATIVE 07/16/2022 2316   PROTEINUR NEGATIVE 07/16/2022 2316   NITRITE NEGATIVE 07/16/2022 2316   LEUKOCYTESUR NEGATIVE 07/16/2022 2316   LEUKOCYTESUR Negative 03/06/2014 2053   Sepsis Labs Recent Labs  Lab 09/19/23 1036 09/20/23 0250 09/21/23 0526  WBC 63.2* 55.6* 62.5*   Microbiology Recent Results (from the past 240 hour(s))  Resp panel by RT-PCR (RSV, Flu A&B, Covid) Anterior Nasal Swab     Status: None   Collection Time: 09/19/23 10:53 AM   Specimen: Anterior Nasal Swab  Result Value Ref Range Status   SARS Coronavirus 2 by RT PCR NEGATIVE NEGATIVE Final    Comment: (NOTE) SARS-CoV-2 target nucleic acids are NOT DETECTED.  The SARS-CoV-2 RNA is generally detectable in upper respiratory specimens during the acute phase of infection. The lowest concentration of SARS-CoV-2 viral copies this assay can detect is 138  copies/mL. A negative result does not preclude SARS-Cov-2 infection and should not be used as the sole basis for treatment or other patient management decisions. A negative result may occur with  improper specimen collection/handling, submission of specimen other than nasopharyngeal swab, presence of viral mutation(s) within the areas targeted by this assay, and inadequate number of viral copies(<138 copies/mL). A negative result must be combined with clinical observations, patient history, and epidemiological information. The expected result is Negative.  Fact Sheet for Patients:  BloggerCourse.com  Fact Sheet for Healthcare Providers:  SeriousBroker.it  This test is no t yet approved or cleared by the [place] FDA and  has been authorized for detection and/or diagnosis of SARS-CoV-2 by FDA under an Emergency Use Authorization (EUA). This EUA will remain  in effect (meaning this test can be used) for the duration of the COVID-19 declaration under Section 564(b)(1) of the Act, 21 U.S.C.section 360bbb-3(b)(1), unless the authorization is terminated  or revoked sooner.       Influenza A by PCR NEGATIVE NEGATIVE Final   Influenza B by PCR NEGATIVE NEGATIVE Final    Comment: (NOTE) The Xpert Xpress SARS-CoV-2/FLU/RSV plus assay is intended as an aid in the diagnosis of influenza from Nasopharyngeal swab specimens and should not be used as a sole basis for treatment. Nasal washings and aspirates are unacceptable for Xpert Xpress SARS-CoV-2/FLU/RSV testing.  Fact Sheet for Patients: BloggerCourse.com  Fact Sheet for Healthcare Providers: SeriousBroker.it  This test is not yet approved or cleared by the [place] FDA and has been authorized for detection and/or diagnosis of SARS-CoV-2 by FDA under an Emergency Use Authorization (EUA). This EUA will remain in effect (meaning  this test can be used) for the duration of the COVID-19 declaration under Section 564(b)(1) of the Act, 21 U.S.C. section 360bbb-3(b)(1), unless the authorization is terminated or revoked.     Resp Syncytial Virus by PCR NEGATIVE NEGATIVE Final    Comment: (NOTE) Fact Sheet for Patients: BloggerCourse.com  Fact Sheet for Healthcare Providers: SeriousBroker.it  This test is not yet approved or cleared by the [place] FDA and has been authorized for detection and/or diagnosis of SARS-CoV-2 by FDA under an Emergency Use Authorization (EUA). This EUA will remain in effect (meaning this test can be used) for the duration of the COVID-19 declaration under  Section 564(b)(1) of the Act, 21 U.S.C. section 360bbb-3(b)(1), unless the authorization is terminated or revoked.  Performed at [facility], [address] [place], [place] [address]    Imaging DG Chest 2 View  Result Date: 09/19/2023 CLINICAL DATA:  Shortness of breath EXAM: CHEST - 2 VIEW COMPARISON:  Chest radiograph dated 06/18/2023 FINDINGS: Normal lung volumes. Patchy basilar opacities. Trace blunting of bilateral costophrenic angles. No pneumothorax. Similar mildly enlarged cardiomediastinal silhouette. No acute osseous abnormality. IMPRESSION: Trace blunting of bilateral costophrenic angles, which may represent trace pleural effusions or atelectasis. Electronically Signed   By: Agustin Cree M.D.   On: 09/19/2023 11:14      Time coordinating discharge: over 30 minutes  SIGNED:  Sunnie Nielsen DO [facility]

## 2023-09-23 LAB — PREPARE RBC (CROSSMATCH)

## 2023-09-24 ENCOUNTER — Other Ambulatory Visit: Payer: Self-pay

## 2023-09-24 DIAGNOSIS — C9110 Chronic lymphocytic leukemia of B-cell type not having achieved remission: Secondary | ICD-10-CM

## 2023-09-25 ENCOUNTER — Inpatient Hospital Stay (HOSPITAL_BASED_OUTPATIENT_CLINIC_OR_DEPARTMENT_OTHER): Payer: Medicare Other | Admitting: Internal Medicine

## 2023-09-25 ENCOUNTER — Inpatient Hospital Stay: Payer: Medicare Other | Attending: Internal Medicine

## 2023-09-25 ENCOUNTER — Encounter: Payer: Self-pay | Admitting: Internal Medicine

## 2023-09-25 VITALS — BP 153/39 | HR 80 | Temp 98.9°F | Ht 64.0 in | Wt 103.0 lb

## 2023-09-25 DIAGNOSIS — C9110 Chronic lymphocytic leukemia of B-cell type not having achieved remission: Secondary | ICD-10-CM | POA: Insufficient documentation

## 2023-09-25 DIAGNOSIS — I509 Heart failure, unspecified: Secondary | ICD-10-CM | POA: Insufficient documentation

## 2023-09-25 DIAGNOSIS — R0602 Shortness of breath: Secondary | ICD-10-CM | POA: Insufficient documentation

## 2023-09-25 DIAGNOSIS — Z79899 Other long term (current) drug therapy: Secondary | ICD-10-CM | POA: Diagnosis not present

## 2023-09-25 DIAGNOSIS — I110 Hypertensive heart disease with heart failure: Secondary | ICD-10-CM | POA: Diagnosis not present

## 2023-09-25 LAB — CBC WITH DIFFERENTIAL (CANCER CENTER ONLY)
Abs Immature Granulocytes: 0.43 10*3/uL — ABNORMAL HIGH (ref 0.00–0.07)
Basophils Absolute: 0.1 10*3/uL (ref 0.0–0.1)
Basophils Relative: 0 %
Eosinophils Absolute: 1.2 10*3/uL — ABNORMAL HIGH (ref 0.0–0.5)
Eosinophils Relative: 2 %
HCT: 28.7 % — ABNORMAL LOW (ref 36.0–46.0)
Hemoglobin: 9 g/dL — ABNORMAL LOW (ref 12.0–15.0)
Immature Granulocytes: 1 %
Lymphocytes Relative: 80 %
Lymphs Abs: 60.5 10*3/uL — ABNORMAL HIGH (ref 0.7–4.0)
MCH: 32 pg (ref 26.0–34.0)
MCHC: 31.4 g/dL (ref 30.0–36.0)
MCV: 102.1 fL — ABNORMAL HIGH (ref 80.0–100.0)
Monocytes Absolute: 2.9 10*3/uL — ABNORMAL HIGH (ref 0.1–1.0)
Monocytes Relative: 4 %
Neutro Abs: 9.5 10*3/uL — ABNORMAL HIGH (ref 1.7–7.7)
Neutrophils Relative %: 13 %
Platelet Count: 186 10*3/uL (ref 150–400)
RBC: 2.81 MIL/uL — ABNORMAL LOW (ref 3.87–5.11)
RDW: 21.5 % — ABNORMAL HIGH (ref 11.5–15.5)
Smear Review: NORMAL
WBC Count: 74.6 10*3/uL (ref 4.0–10.5)
nRBC: 0 % (ref 0.0–0.2)

## 2023-09-25 LAB — CMP (CANCER CENTER ONLY)
ALT: 8 U/L (ref 0–44)
AST: 15 U/L (ref 15–41)
Albumin: 4 g/dL (ref 3.5–5.0)
Alkaline Phosphatase: 101 U/L (ref 38–126)
Anion gap: 8 (ref 5–15)
BUN: 40 mg/dL — ABNORMAL HIGH (ref 8–23)
CO2: 22 mmol/L (ref 22–32)
Calcium: 8.7 mg/dL — ABNORMAL LOW (ref 8.9–10.3)
Chloride: 106 mmol/L (ref 98–111)
Creatinine: 1.51 mg/dL — ABNORMAL HIGH (ref 0.44–1.00)
GFR, Estimated: 33 mL/min — ABNORMAL LOW (ref >60)
Glucose, Bld: 97 mg/dL (ref 70–99)
Potassium: 5.1 mmol/L (ref 3.5–5.1)
Sodium: 136 mmol/L (ref 135–145)
Total Bilirubin: 1.1 mg/dL (ref 0.3–1.2)
Total Protein: 6.2 g/dL — ABNORMAL LOW (ref 6.5–8.1)

## 2023-09-25 LAB — SAMPLE TO BLOOD BANK

## 2023-09-25 MED ORDER — METHYLPREDNISOLONE 4 MG PO TBPK: ORAL_TABLET | ORAL | 1 refills | Status: DC

## 2023-09-25 NOTE — Progress Notes (Signed)
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 Low level hypermetabolism consistent with CLL.2. Splenomegaly but no focal splenic lesions.  # April 2022- 13 Q DEL; IGVH- UNMUTATED  # MID April 2022- IBRUTINIB 280 mg x3 days [STOPPED sec ER/hypertensive emergency- 191/129]  # MAY 26th, 2022- start acalabrutinib 100 mg once a day [reduced dose]; intermittent; discontinued  September mid 2022. [Muscle cramps.]  # MAY 1st week- 2024- Zanubrutinib 80 mg; stopped after 1-2 weeks- sec to acute renal failure hyperkalemia/extreme fatigue etc.  Patient declined further therapies.       CLL (chronic lymphocytic leukemia) (HCC)  02/14/2021 Initial Diagnosis   CLL (chronic lymphocytic leukemia) (HCC)   04/07/2021 Cancer Staging   Staging form: Chronic Lymphocytic Leukemia / Small Lymphocytic Lymphoma, AJCC 8th Edition - Clinical: Modified Rai Stage III (Modified Rai risk: High, Binet: Stage C) - Signed by Lindsay Coder, MD on 04/07/2021 Stage prefix: Initial diagnosis   12/16/2021 -  Chemotherapy   Patient is on Treatment Plan : CLL - Rituximab q 4 weeks      HISTORY OF PRESENTING ILLNESS: Frail-appearing Caucasian female patient/accompanied by daughter.  Ambulating independently.  Lindsay [name] 89 y.o.  female symptomatic CLL currently on on surveillance because of intolerance to  ibrutinib/acalabrutinib/zanubrutinib is here for follow-up.  Patient was recently admitted to hospital for shortness of breath/congestive heart failure likely secondary to severe anemia hemoglobin 5.7.  Patient s/p blood transfusion.   Complains of severe arthritis.   Also noticed significant increasing size of the neck lymph nodes/underarm lymph nodes.  Review of Systems  Constitutional:  Positive for malaise/fatigue and weight loss. Negative for chills, diaphoresis and fever.  HENT:  Negative for nosebleeds and sore throat.   Eyes:  Negative for double vision.  Respiratory:  Negative for cough, hemoptysis, sputum production, shortness of breath and wheezing.   Cardiovascular:  Negative for chest pain, palpitations, orthopnea and leg swelling.  Gastrointestinal:  Negative for abdominal pain, constipation, diarrhea, heartburn, melena, nausea and vomiting.  Genitourinary:  Negative for dysuria, frequency and urgency.  Musculoskeletal:  Positive for joint pain. Negative for back pain.  Skin: Negative.  Negative for itching and rash.  Neurological:  Negative for dizziness, tingling, focal weakness, weakness and headaches.  Endo/Heme/Allergies:  Bruises/bleeds easily.  Psychiatric/Behavioral:  Negative for depression. The patient is not nervous/anxious and does not have insomnia.      MEDICAL HISTORY:  Past Medical History:  Diagnosis Date   Arthritis    RHEUMATOID   CHF (congestive heart failure) (HCC)    CLL (chronic lymphocytic leukemia) (HCC)    Edema    MILD ANKLE OCCAS   Fibromyalgia    GERD (gastroesophageal reflux disease)    Hypertension    Kidney stones    RLS (restless legs syndrome)     SURGICAL HISTORY: Past Surgical History:  Procedure Laterality Date   ABDOMINAL HYSTERECTOMY     CATARACT EXTRACTION W/PHACO Left 02/07/2016   Procedure: CATARACT EXTRACTION PHACO AND INTRAOCULAR LENS PLACEMENT (IOC);  Surgeon:  Sallee Lange, MD;  Location: [facility] ORS;  Service:  Ophthalmology;  Laterality: Left;  [place] 01:33 AP% 24.7 CDE 39.57 fluid pack lot # [id number]   EXTRACORPOREAL SHOCK WAVE LITHOTRIPSY Right 07/03/2019   Procedure: EXTRACORPOREAL SHOCK WAVE LITHOTRIPSY (ESWL);  Surgeon: Orson Ape, MD;  Location: [facility] ORS;  Service: Urology;  Laterality: Right;   EYE SURGERY     HIP ARTHROPLASTY Left 04/20/2020   Procedure: ARTHROPLASTY BIPOLAR HIP (HEMIARTHROPLASTY);  Surgeon: Kennedy Bucker, MD;  Location: [facility] ORS;  Service: Orthopedics;  Laterality: Left;   LITHOTRIPSY     TONSILLECTOMY      SOCIAL HISTORY: Social History   Socioeconomic History   Marital status: Widowed    Spouse name: Not on file   Number of children: Not on file   Years of education: Not on file   Highest education level: Not on file  Occupational History   Not on file  Tobacco Use   Smoking status: Never   Smokeless tobacco: Never  Substance and Sexual Activity   Alcohol use: No   Drug use: Never   Sexual activity: Not Currently  Other Topics Concern   Not on file  Social History Narrative   Lives with daughter, in [place]. 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    Social Determinants of Health   Financial Resource Strain: Not on file  Food Insecurity: No Food Insecurity (09/19/2023)   Hunger Vital Sign    Worried About Running Out of Food in the Last Year: Never true    Ran Out of Food in the Last Year: Never true  Transportation Needs: No Transportation Needs (09/19/2023)   PRAPARE - Administrator, Civil Service (Medical): No    Lack of Transportation (Non-Medical): No  Physical Activity: Not on file  Stress: Not on file  Social Connections: Not on file  Intimate Partner Violence: Not At Risk (09/19/2023)   Humiliation, Afraid, Rape, and Kick questionnaire    Fear of Current or Ex-Partner: No    Emotionally Abused: No    Physically Abused: No    Sexually Abused: No    FAMILY HISTORY: Family History  Problem Relation Age of Onset    Cancer Mother        uterine cancer    ALLERGIES:  is allergic to gabapentin and demerol [meperidine].  MEDICATIONS:  Current Outpatient Medications  Medication Sig Dispense Refill   amitriptyline (ELAVIL) 10 MG tablet Take 20 mg by mouth at bedtime.     cyanocobalamin (,VITAMIN B-12,) 1000 MCG/ML injection Inject into the muscle every 14 (fourteen) days.     cyanocobalamin (VITAMIN B12) 1000 MCG tablet Take 1,000 mcg by mouth daily.     DULoxetine (CYMBALTA) 20 MG capsule Take 20 mg by mouth daily.     feeding supplement, ENSURE ENLIVE, (ENSURE ENLIVE) LIQD Take 237 mLs by mouth 2 (two) times daily between meals. 237 mL 12   furosemide (LASIX) 20 MG tablet Take 2 tablets (40 mg total) by mouth daily. 30 tablet 0   HYDROcodone-acetaminophen (NORCO) 10-325 MG tablet Take 1 tablet by mouth every 6 (six) hours as needed.     methylPREDNISolone (MEDROL DOSEPAK) 4 MG TBPK tablet Use as directed. 21 tablet 1   No current facility-administered medications for this visit.      Marland Kitchen  PHYSICAL EXAMINATION: ECOG PERFORMANCE STATUS: 0 - Asymptomatic  Vitals:   09/25/23 0904  BP: (!) 153/39  Pulse: 80  Temp: 98.9 F (37.2 C)  SpO2: 98%  Filed Weights   09/25/23 0904  Weight: 103 lb (46.7 kg)    Bulky bilateral neck adenopathy; underarm adenopathy.  Physical Exam HENT:     Head: Normocephalic and atraumatic.     Mouth/Throat:     Pharynx: No oropharyngeal exudate.  Eyes:     Pupils: Pupils are equal, round, and reactive to light.  Cardiovascular:     Rate and Rhythm: Normal rate and regular rhythm.  Pulmonary:     Effort: Pulmonary effort is normal. No respiratory distress.     Breath sounds: Normal breath sounds. No wheezing.  Abdominal:     General: Bowel sounds are normal. There is no distension.     Palpations: Abdomen is soft. There is no mass.     Tenderness: There is no abdominal tenderness. There is no guarding or rebound.  Musculoskeletal:        General: No  tenderness. Normal range of motion.     Cervical back: Normal range of motion and neck supple.  Skin:    General: Skin is warm.     Comments: Chronic multiple bruises noted.  Neurological:     Mental Status: She is alert and oriented to person, place, and time.  Psychiatric:        Mood and Affect: Affect normal.      LABORATORY DATA:  I have reviewed the data as listed Lab Results  Component Value Date   WBC 74.6 (HH) 09/25/2023   HGB 9.0 (L) 09/25/2023   HCT 28.7 (L) 09/25/2023   MCV 102.1 (H) 09/25/2023   PLT 186 09/25/2023   Recent Labs    05/07/23 1300 06/18/23 0631 06/19/23 0415 09/20/23 0250 09/21/23 0526 09/25/23 0855  NA 139 141   < > 136 138 136  K 3.9 3.9   < > 4.5 3.8 5.1  CL 109 113*   < > 106 109 106  CO2 21* 18*   < > 21* 21* 22  GLUCOSE 115* 116*   < > 109* 95 97  BUN 36* 32*   < > 43* 42* 40*  CREATININE 1.27* 1.33*   < > 1.67* 1.59* 1.51*  CALCIUM 8.2* 8.4*   < > 8.1* 8.1* 8.7*  GFRNONAA 41* 39*   < > 29* 31* 33*  PROT 5.8* 5.6*  --   --   --  6.2*  ALBUMIN 3.7 3.8  --   --   --  4.0  AST 15 16  --   --   --  15  ALT 9 10  --   --   --  8  ALKPHOS 79 84  --   --   --  101  BILITOT 0.5 0.7  --   --   --  1.1   < > = values in this interval not displayed.    RADIOGRAPHIC STUDIES: I have personally reviewed the radiological images as listed and agreed with the findings in the report. DG Chest 2 View  Result Date: 09/19/2023 CLINICAL DATA:  Shortness of breath EXAM: CHEST - 2 VIEW COMPARISON:  Chest radiograph dated 06/18/2023 FINDINGS: Normal lung volumes. Patchy basilar opacities. Trace blunting of bilateral costophrenic angles. No pneumothorax. Similar mildly enlarged cardiomediastinal silhouette. No acute osseous abnormality. IMPRESSION: Trace blunting of bilateral costophrenic angles, which may represent trace pleural effusions or atelectasis. Electronically Signed   By: Agustin Cree M.D.   On: 09/19/2023 11:14    ASSESSMENT & PLAN:   CLL  (chronic lymphocytic leukemia) (HCC) #  CLL: [56% OF NUCLEI POSITIVE FOR A 13Q DELETION]; April 2022-PET scan shows significant lymphadenopathy above and below diaphragm; splenomegaly. Discontinued Ibrutinib [hypertensive emergencies] acalabrutinib [leg cramps even with minimum dose].  # Patient tolerated zanubrutinib poorly-with hypokalemia loss and renal failure/extreme fatigue etc.  She discontinued zanubrutinib.   # Discussed option of rituximab infusion weekly x 4- and other options.  Again discussed the potential side effects including but not limited to acute reactions risk of infections renal failure etc.  After lengthy discussion patient and the daughter declined, and I agree patient is far more weaker that before.   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  Hemoglobin today is 9.0  Hold off any transfusion.  Will plan PRBC suportive care [grand baby in end of nov]. See discussion below  # Arthritis- worse- recommend medrol dose pack-   # Cardiac -history of CHF / elevated blood pressure-overall stable continue monitoring at home.  # Worsening renal function GFR 40s.  Underlying diuretic/CHF/possible tumor lysis- stable. .  # Prognosis: I had a long discussion the patient and daughter regarding overall poor prognosis of untreated progressive CLL.  Patient unfortunately has poor tolerance to therapy so far.  She declines any other alternative therapies.  Discussed regarding hospice. Previously declined hospice; currently open to hospice. However,  patient for now wants to  continue PRBC transfusion for next 1-2 months [given upcoming grand baby]  # DISPOSITION:  # follow up in 2 weeks- APP; labs- cbc/ bmp; BNP; HOLD tube; D-2- possible 1 unit PRBC- Dr.B   Dr.Hedrick   All questions were answered. The patient knows to call the clinic with any problems, questions or concerns.    Lindsay Coder, MD 09/25/2023 10:45 AM

## 2023-09-25 NOTE — Assessment & Plan Note (Addendum)
#   CLL: [56% OF NUCLEI POSITIVE FOR A 13Q DELETION]; April 2022-PET scan shows significant lymphadenopathy above and below diaphragm; splenomegaly. Discontinued Ibrutinib [hypertensive emergencies] acalabrutinib [leg cramps even with minimum dose].  # Patient tolerated zanubrutinib poorly-with hypokalemia loss and renal failure/extreme fatigue etc.  She discontinued zanubrutinib.   # Discussed option of rituximab infusion weekly x 4- and other options.  Again discussed the potential side effects including but not limited to acute reactions risk of infections renal failure etc.  After lengthy discussion patient and the daughter declined, and I agree patient is far more weaker that before.   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  Hemoglobin today is 9.0  Hold off any transfusion.  Will plan PRBC suportive care [grand baby in end of nov]. See discussion below  # Arthritis- worse- recommend medrol dose pack-   # Cardiac -history of CHF / elevated blood pressure-overall stable continue monitoring at home.  # Worsening renal function GFR 40s.  Underlying diuretic/CHF/possible tumor lysis- stable. .  # Prognosis: I had a long discussion the patient and daughter regarding overall poor prognosis of untreated progressive CLL.  Patient unfortunately has poor tolerance to therapy so far.  She declines any other alternative therapies.  Discussed regarding hospice. Previously declined hospice; currently open to hospice. However,  patient for now wants to  continue PRBC transfusion for next 1-2 months [given upcoming grand baby]  # DISPOSITION:  # follow up in 2 weeks- APP; labs- cbc/ bmp; BNP; HOLD tube; D-2- possible 1 unit PRBC- Dr.B   Dr.Hedrick

## 2023-09-25 NOTE — Progress Notes (Signed)
Patient started having shortness of breath about a week ago, she did go to ED on 09/19/2023. She is also wanting to maybe up the dosage of her Hydrocodone due to her hands bothering her so much (rates at a 10 on pain scale). Also she would like something to help get her constipation under control. While patient was in the hospital they took her off of her Blood pressure medication due to it being so low.

## 2023-09-25 NOTE — Progress Notes (Signed)
Critical WBC count of 74.6 Notified by Enzo Bi in lab. Read back. MD notified.

## 2023-09-26 ENCOUNTER — Other Ambulatory Visit: Payer: Self-pay | Admitting: Family

## 2023-09-26 ENCOUNTER — Other Ambulatory Visit: Payer: Self-pay

## 2023-09-27 ENCOUNTER — Ambulatory Visit: Payer: Medicare Other | Admitting: Podiatry

## 2023-09-27 NOTE — Telephone Encounter (Signed)
Discontinued by: Sunnie Nielsen, DO on 09/21/2023 16:06  Reason: Stop Taking at Discharge

## 2023-10-02 ENCOUNTER — Encounter: Payer: Medicare Other | Admitting: Family

## 2023-10-04 ENCOUNTER — Other Ambulatory Visit: Payer: Self-pay | Admitting: Family

## 2023-10-05 LAB — TYPE AND SCREEN
ABO/RH(D): O POS
Antibody Screen: POSITIVE
DAT, IgG: POSITIVE
DAT, complement: POSITIVE
Donor AG Type: NEGATIVE
Donor AG Type: NEGATIVE
Unit division: 0
Unit division: 0
Unit division: 0
Unit division: 0
Unit division: 0

## 2023-10-05 LAB — BPAM RBC
Blood Product Expiration Date: 202410092359
Blood Product Expiration Date: 202410312359
Blood Product Expiration Date: 202410312359
Blood Product Expiration Date: 202410312359
Blood Product Expiration Date: 202410312359
ISSUE DATE / TIME: 202410030529
ISSUE DATE / TIME: 202410031007
Unit Type and Rh: 5100
Unit Type and Rh: 5100
Unit Type and Rh: 5100
Unit Type and Rh: 5100
Unit Type and Rh: 9500

## 2023-10-09 ENCOUNTER — Encounter: Payer: Self-pay | Admitting: Nurse Practitioner

## 2023-10-09 ENCOUNTER — Inpatient Hospital Stay (HOSPITAL_BASED_OUTPATIENT_CLINIC_OR_DEPARTMENT_OTHER): Payer: Medicare Other | Admitting: Nurse Practitioner

## 2023-10-09 ENCOUNTER — Encounter: Payer: Self-pay | Admitting: Family

## 2023-10-09 ENCOUNTER — Other Ambulatory Visit: Payer: Self-pay | Admitting: *Deleted

## 2023-10-09 ENCOUNTER — Ambulatory Visit: Payer: Medicare Other | Attending: Family | Admitting: Family

## 2023-10-09 ENCOUNTER — Inpatient Hospital Stay: Payer: Medicare Other

## 2023-10-09 VITALS — BP 142/43 | HR 58 | Wt 101.0 lb

## 2023-10-09 VITALS — BP 142/44 | HR 64 | Temp 97.0°F | Wt 100.6 lb

## 2023-10-09 DIAGNOSIS — D519 Vitamin B12 deficiency anemia, unspecified: Secondary | ICD-10-CM

## 2023-10-09 DIAGNOSIS — C9110 Chronic lymphocytic leukemia of B-cell type not having achieved remission: Secondary | ICD-10-CM

## 2023-10-09 DIAGNOSIS — M797 Fibromyalgia: Secondary | ICD-10-CM | POA: Diagnosis not present

## 2023-10-09 DIAGNOSIS — I130 Hypertensive heart and chronic kidney disease with heart failure and stage 1 through stage 4 chronic kidney disease, or unspecified chronic kidney disease: Secondary | ICD-10-CM | POA: Insufficient documentation

## 2023-10-09 DIAGNOSIS — I5042 Chronic combined systolic (congestive) and diastolic (congestive) heart failure: Secondary | ICD-10-CM

## 2023-10-09 DIAGNOSIS — D649 Anemia, unspecified: Secondary | ICD-10-CM | POA: Diagnosis not present

## 2023-10-09 DIAGNOSIS — I428 Other cardiomyopathies: Secondary | ICD-10-CM | POA: Insufficient documentation

## 2023-10-09 DIAGNOSIS — I5020 Unspecified systolic (congestive) heart failure: Secondary | ICD-10-CM | POA: Insufficient documentation

## 2023-10-09 DIAGNOSIS — G2581 Restless legs syndrome: Secondary | ICD-10-CM | POA: Insufficient documentation

## 2023-10-09 DIAGNOSIS — I509 Heart failure, unspecified: Secondary | ICD-10-CM | POA: Diagnosis present

## 2023-10-09 DIAGNOSIS — K219 Gastro-esophageal reflux disease without esophagitis: Secondary | ICD-10-CM | POA: Diagnosis not present

## 2023-10-09 DIAGNOSIS — N189 Chronic kidney disease, unspecified: Secondary | ICD-10-CM | POA: Insufficient documentation

## 2023-10-09 DIAGNOSIS — I10 Essential (primary) hypertension: Secondary | ICD-10-CM | POA: Diagnosis not present

## 2023-10-09 DIAGNOSIS — D631 Anemia in chronic kidney disease: Secondary | ICD-10-CM | POA: Insufficient documentation

## 2023-10-09 DIAGNOSIS — I447 Left bundle-branch block, unspecified: Secondary | ICD-10-CM | POA: Diagnosis not present

## 2023-10-09 DIAGNOSIS — K589 Irritable bowel syndrome without diarrhea: Secondary | ICD-10-CM | POA: Diagnosis not present

## 2023-10-09 LAB — CBC WITH DIFFERENTIAL (CANCER CENTER ONLY)
Abs Immature Granulocytes: 0.21 10*3/uL — ABNORMAL HIGH (ref 0.00–0.07)
Basophils Absolute: 0.1 10*3/uL (ref 0.0–0.1)
Basophils Relative: 0 %
Eosinophils Absolute: 1.3 10*3/uL — ABNORMAL HIGH (ref 0.0–0.5)
Eosinophils Relative: 2 %
HCT: 25.6 % — ABNORMAL LOW (ref 36.0–46.0)
Hemoglobin: 7.9 g/dL — ABNORMAL LOW (ref 12.0–15.0)
Immature Granulocytes: 0 %
Lymphocytes Relative: 87 %
Lymphs Abs: 59.4 10*3/uL — ABNORMAL HIGH (ref 0.7–4.0)
MCH: 32.1 pg (ref 26.0–34.0)
MCHC: 30.9 g/dL (ref 30.0–36.0)
MCV: 104.1 fL — ABNORMAL HIGH (ref 80.0–100.0)
Monocytes Absolute: 2.8 10*3/uL — ABNORMAL HIGH (ref 0.1–1.0)
Monocytes Relative: 4 %
Neutro Abs: 4.5 10*3/uL (ref 1.7–7.7)
Neutrophils Relative %: 7 %
Platelet Count: 204 10*3/uL (ref 150–400)
RBC: 2.46 MIL/uL — ABNORMAL LOW (ref 3.87–5.11)
RDW: 19.5 % — ABNORMAL HIGH (ref 11.5–15.5)
Smear Review: NORMAL
WBC Count: 68.3 10*3/uL (ref 4.0–10.5)
nRBC: 0 % (ref 0.0–0.2)

## 2023-10-09 LAB — BRAIN NATRIURETIC PEPTIDE: B Natriuretic Peptide: 742.1 pg/mL — ABNORMAL HIGH (ref 0.0–100.0)

## 2023-10-09 LAB — BASIC METABOLIC PANEL - CANCER CENTER ONLY
Anion gap: 6 (ref 5–15)
BUN: 32 mg/dL — ABNORMAL HIGH (ref 8–23)
CO2: 25 mmol/L (ref 22–32)
Calcium: 8.4 mg/dL — ABNORMAL LOW (ref 8.9–10.3)
Chloride: 107 mmol/L (ref 98–111)
Creatinine: 1.33 mg/dL — ABNORMAL HIGH (ref 0.44–1.00)
GFR, Estimated: 39 mL/min — ABNORMAL LOW (ref >60)
Glucose, Bld: 110 mg/dL — ABNORMAL HIGH (ref 70–99)
Potassium: 4.4 mmol/L (ref 3.5–5.1)
Sodium: 138 mmol/L (ref 135–145)

## 2023-10-09 LAB — SAMPLE TO BLOOD BANK

## 2023-10-09 LAB — PREPARE RBC (CROSSMATCH)

## 2023-10-09 MED ORDER — FUROSEMIDE 20 MG PO TABS: 40.0000 mg | ORAL_TABLET | Freq: Every day | ORAL | 5 refills | Status: DC

## 2023-10-09 NOTE — Progress Notes (Signed)
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 Low level hypermetabolism consistent with CLL.2. Splenomegaly but no focal splenic lesions.  # April 2022- 13 Q DEL; IGVH- UNMUTATED  # MID April 2022- IBRUTINIB 280 mg x3 days [STOPPED sec ER/hypertensive emergency- 191/129]  # MAY 26th, 2022- start acalabrutinib 100 mg once a day [reduced dose]; intermittent; discontinued  September mid 2022. [Muscle cramps.]  # MAY 1st week- 2024- Zanubrutinib 80 mg; stopped after 1-2 weeks- sec to acute renal failure hyperkalemia/extreme fatigue etc.  Patient declined further therapies.       CLL (chronic lymphocytic leukemia) (HCC)  02/14/2021 Initial Diagnosis   CLL (chronic lymphocytic leukemia) (HCC)   04/07/2021 Cancer Staging   Staging form: Chronic Lymphocytic Leukemia / Small Lymphocytic Lymphoma, AJCC 8th Edition - Clinical: Modified Rai Stage III (Modified Rai risk: High, Binet: Stage C) - Signed by Earna Coder, MD on 04/07/2021 Stage prefix: Initial diagnosis   12/16/2021 -  Chemotherapy   Patient is on Treatment Plan : CLL - Rituximab q 4 weeks      HISTORY OF PRESENTING ILLNESS: Frail-appearing Caucasian female patient/accompanied by daughter.  Ambulating independently.  Lindsay [name] 89 y.o.  female symptomatic CLL currently on on surveillance because of intolerance to  ibrutinib/acalabrutinib/zanubrutinib is here for follow-up. No interval admissions but was admitted d/t shortness of breath on 09/19/23 with hemoglobin 5.7 s/p 2 units pRBCs and lasix. [name] was stopped d/t hypotension. She feels short of breath but overall improved since hospitalization. She's eager to live to birth of her great grand child later next month.    Review of Systems  Constitutional:  Positive for malaise/fatigue and weight loss. Negative for chills, diaphoresis and fever.  HENT:  Negative for nosebleeds and sore throat.   Eyes:  Negative for double vision.  Respiratory:  Positive for shortness of breath. Negative for cough, hemoptysis, sputum production and wheezing.   Cardiovascular:  Negative for chest pain, palpitations, orthopnea and leg swelling.  Gastrointestinal:  Negative for abdominal pain, blood in stool, constipation, diarrhea, heartburn, melena, nausea and vomiting.  Genitourinary:  Negative for dysuria, flank pain, frequency, hematuria and urgency.  Musculoskeletal:  Positive for joint pain. Negative for back pain, falls and myalgias.  Skin:  Negative for itching and rash.  Neurological:  Negative for dizziness, tingling, focal weakness, weakness and headaches.  Endo/Heme/Allergies:  Bruises/bleeds easily.  Psychiatric/Behavioral:  Negative for depression. The patient is not nervous/anxious and does not have insomnia.      MEDICAL HISTORY:  Past Medical History:  Diagnosis Date   Arthritis    RHEUMATOID   CHF (congestive heart failure) (HCC)    CLL (chronic lymphocytic leukemia) (HCC)    Edema    MILD ANKLE OCCAS   Fibromyalgia    GERD (gastroesophageal reflux disease)    Hypertension    Kidney stones    RLS (restless legs syndrome)     SURGICAL HISTORY: Past Surgical History:  Procedure Laterality Date   ABDOMINAL HYSTERECTOMY     CATARACT  EXTRACTION W/PHACO Left 02/07/2016   Procedure: CATARACT EXTRACTION PHACO AND INTRAOCULAR LENS PLACEMENT (IOC);   Surgeon: Sallee Lange, MD;  Location: [facility] ORS;  Service: Ophthalmology;  Laterality: Left;  [place] 01:33 AP% 24.7 CDE 39.57 fluid pack lot # [id number]   EXTRACORPOREAL SHOCK WAVE LITHOTRIPSY Right 07/03/2019   Procedure: EXTRACORPOREAL SHOCK WAVE LITHOTRIPSY (ESWL);  Surgeon: Orson Ape, MD;  Location: [facility] ORS;  Service: Urology;  Laterality: Right;   EYE SURGERY     HIP ARTHROPLASTY Left 04/20/2020   Procedure: ARTHROPLASTY BIPOLAR HIP (HEMIARTHROPLASTY);  Surgeon: Kennedy Bucker, MD;  Location: [facility] ORS;  Service: Orthopedics;  Laterality: Left;   LITHOTRIPSY     TONSILLECTOMY      SOCIAL HISTORY: Social History   Socioeconomic History   Marital status: Widowed    Spouse name: Not on file   Number of children: Not on file   Years of education: Not on file   Highest education level: Not on file  Occupational History   Not on file  Tobacco Use   Smoking status: Never   Smokeless tobacco: Never  Substance and Sexual Activity   Alcohol use: No   Drug use: Never   Sexual activity: Not Currently  Other Topics Concern   Not on file  Social History Narrative   Lives with daughter, in [place]. 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    Social Determinants of Health   Financial Resource Strain: Not on file  Food Insecurity: No Food Insecurity (09/19/2023)   Hunger Vital Sign    Worried About Running Out of Food in the Last Year: Never true    Ran Out of Food in the Last Year: Never true  Transportation Needs: No Transportation Needs (09/19/2023)   PRAPARE - Administrator, Civil Service (Medical): No    Lack of Transportation (Non-Medical): No  Physical Activity: Not on file  Stress: Not on file  Social Connections: Not on file  Intimate Partner Violence: Not At Risk (09/19/2023)   Humiliation, Afraid, Rape, and Kick questionnaire    Fear of Current or Ex-Partner: No    Emotionally Abused: No    Physically Abused: No    Sexually Abused: No     FAMILY HISTORY: Family History  Problem Relation Age of Onset   Cancer Mother        uterine cancer    ALLERGIES:  is allergic to gabapentin and demerol [meperidine].  MEDICATIONS:  Current Outpatient Medications  Medication Sig Dispense Refill   amitriptyline (ELAVIL) 10 MG tablet Take 20 mg by mouth at bedtime.     cyanocobalamin (,VITAMIN B-12,) 1000 MCG/ML injection Inject into the muscle every 14 (fourteen) days.     cyanocobalamin (VITAMIN B12) 1000 MCG tablet Take 1,000 mcg by mouth daily.     DULoxetine (CYMBALTA) 20 MG capsule Take 20 mg by mouth daily.     feeding supplement, ENSURE ENLIVE, (ENSURE ENLIVE) LIQD Take 237 mLs by mouth 2 (two) times daily between meals. 237 mL 12   furosemide (LASIX) 20 MG tablet Take 2 tablets (40 mg total) by mouth daily. 30 tablet 0   HYDROcodone-acetaminophen (NORCO) 10-325 MG tablet Take 1 tablet by mouth every 6 (six) hours as needed.     methylPREDNISolone (MEDROL DOSEPAK) 4 MG TBPK tablet Use as directed. 21 tablet 1   No current facility-administered medications for this visit.      Marland Kitchen  PHYSICAL EXAMINATION: ECOG PERFORMANCE STATUS: 2 - Symptomatic, <50% confined to bed  Vitals:   10/09/23 1005  BP: (!) 142/44  Pulse: 64  Temp: (!) 97 F (36.1 C)  SpO2: 100%   Filed Weights   10/09/23 1005  Weight: 100 lb 9.6 oz (45.6 kg)   Physical Exam Vitals reviewed.  Constitutional:      Appearance: She is not ill-appearing.     Comments: Frail appearing. Accompanied by daughter.   HENT:     Head: Normocephalic and atraumatic.     Mouth/Throat:     Pharynx: No oropharyngeal exudate.  Cardiovascular:     Rate and Rhythm: Normal rate and regular rhythm.  Pulmonary:     Effort: Pulmonary effort is normal. No respiratory distress.  Abdominal:     General: There is no distension.     Palpations: Abdomen is soft.     Tenderness: There is no abdominal tenderness. There is no guarding.  Musculoskeletal:        General: No  tenderness or deformity.  Lymphadenopathy:     Cervical: Cervical adenopathy present.     Upper Body:     Right upper body: Axillary adenopathy present.     Left upper body: Axillary adenopathy present.  Skin:    General: Skin is warm.     Coloration: Skin is pale.     Comments: Chronic multiple bruises noted.  Neurological:     Mental Status: She is alert and oriented to person, place, and time. Mental status is at baseline.  Psychiatric:        Mood and Affect: Mood and affect normal.        Behavior: Behavior normal.    LABORATORY DATA:  I have reviewed the data as listed Lab Results  Component Value Date   WBC 68.3 (HH) 10/09/2023   HGB 7.9 (L) 10/09/2023   HCT 25.6 (L) 10/09/2023   MCV 104.1 (H) 10/09/2023   PLT 204 10/09/2023   Recent Labs    05/07/23 1300 06/18/23 0631 06/19/23 0415 09/21/23 0526 09/25/23 0855 10/09/23 0954  NA 139 141   < > 138 136 138  K 3.9 3.9   < > 3.8 5.1 4.4  CL 109 113*   < > 109 106 107  CO2 21* 18*   < > 21* 22 25  GLUCOSE 115* 116*   < > 95 97 110*  BUN 36* 32*   < > 42* 40* 32*  CREATININE 1.27* 1.33*   < > 1.59* 1.51* 1.33*  CALCIUM 8.2* 8.4*   < > 8.1* 8.7* 8.4*  GFRNONAA 41* 39*   < > 31* 33* 39*  PROT 5.8* 5.6*  --   --  6.2*  --   ALBUMIN 3.7 3.8  --   --  4.0  --   AST 15 16  --   --  15  --   ALT 9 10  --   --  8  --   ALKPHOS 79 84  --   --  101  --   BILITOT 0.5 0.7  --   --  1.1  --    < > = values in this interval not displayed.    RADIOGRAPHIC STUDIES: I have personally reviewed the radiological images as listed and agreed with the findings in the report. DG Chest 2 View  Result Date: 09/19/2023 CLINICAL DATA:  Shortness of breath EXAM: CHEST - 2 VIEW COMPARISON:  Chest radiograph dated 06/18/2023 FINDINGS: Normal lung volumes. Patchy basilar opacities. Trace blunting of bilateral costophrenic angles. No pneumothorax.  Similar mildly enlarged cardiomediastinal silhouette. No acute osseous abnormality. IMPRESSION:  Trace blunting of bilateral costophrenic angles, which may represent trace pleural effusions or atelectasis. Electronically Signed   By: Agustin Cree M.D.   On: 09/19/2023 11:14    ASSESSMENT & PLAN:   CLL (chronic lymphocytic leukemia) (HCC) # CLL: [56% OF NUCLEI POSITIVE FOR A 13Q DELETION]; April 2022-PET scan shows significant lymphadenopathy above and below diaphragm; splenomegaly. Discontinued Ibrutinib [hypertensive emergencies] acalabrutinib [leg cramps even with minimum dose]. Tolerated zanubrutinib poorly-with hypokalemia loss and renal failure/extreme fatigue etc.  She discontinued zanubrutinib. Rituximab was discussed but ultimately patient declined given her overall worsening frailty and weakness. Dr Donneta Romberg in agreement. She continues transfusional support.   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  Hemoglobin today is 7.9. Proceed with 1 unit pRBCs tomorrow. Plan for continue transfusional support. Goal hemoglobin > 8. May require lasix given CHF if > 1 unit transfused.    # Arthritis- stable. Consider steroids if worsening symptoms.    # Cardiac -history of CHF / elevated blood pressure-overall stable continue monitoring at home. Off entresto.    # Worsening renal function GFR 40s.  Underlying diuretic/CHF/possible tumor lysis. GFR 39. Stable.   # Prognosis:  Poor prognosis. Hospice agreed upon however, patient wishes to continue transfusional support. Eager to meet great grand baby, expected late November 2024. Can transition to hospice at any time.    DISPOSITION: Blood tomorrow 2 weeks- labs (cbc, bmp, bnp, hold tube), see me Day later- possible 1 unit pRBCs- la    No problem-specific Assessment & Plan notes found for this encounter. All questions were answered. The patient knows to call the clinic with any problems, questions or concerns.   Alinda Dooms, NP 10/09/2023

## 2023-10-09 NOTE — Progress Notes (Signed)
PCP: Jerl Mina, MD (last seen 04/24) Primary Cardiologist: None  HPI:  Ms Buonocore is a 89 y/o female with a history of HTN, CKD, CLL (not taking chemo), anemia, LBBB, IBS, fibromyalgia, GERD, RLS and heart failure. She was unable to tolerate treatment of CLL with zanubrutinib due to hyperkalemia, renal failure and extreme fatigue and has elected to stop any other treatment for this.  Admitted 06/18/23 due to shortness of breath due to HF exacerbation. Chest x-ray showed cardiomegaly and interstitial edema. BNP 1254, troponin 80--> 116. IV diuresed. Echo showed slight drop in EF to 40-45% from previous 45-50%. Admitted 09/19/23 due to shortness of breath. Had SIRS without infection likely d/t anemia with history of CLL. Also found to be anemic with HG 5.7. Received 2 units of PRBC's with IV lasix afterwards. Symptoms improved. [name] was stopped due to hypotension.   Echo 05/30/22: EF of 45-50% along with mild/moderate MR Echo 06/19/23: EF 40-45% along with mild LVH, Grade II DD, mildly elevated PA pressure, mild/ moderate MR with moderate AS with Aortic valve area, by VTI measures 1.00 cm. Aortic valve mean gradient measures 13.0 mmHg.   She presents today for a HF follow-up visit with a chief complaint of minimal fatigue with exertion. Chronic in nature but she says that she's overall feeling "much better after getting transfusion. Denies shortness of breath, chest pain, cough, palpitations, abdominal distention, dizziness or difficulty sleeping.   Saw hematology earlier today and will be getting blood transfusion tomorrow.   ROS: All systems negative except as listed in HPI, PMH and Problem List.  SH:  Social History   Socioeconomic History   Marital status: Widowed    Spouse name: Not on file   Number of children: Not on file   Years of education: Not on file   Highest education level: Not on file  Occupational History   Not on file  Tobacco Use   Smoking status: Never   Smokeless  tobacco: Never  Substance and Sexual Activity   Alcohol use: No   Drug use: Never   Sexual activity: Not Currently  Other Topics Concern   Not on file  Social History Narrative   Lives with daughter, in [place]. 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    Social Determinants of Health   Financial Resource Strain: Not on file  Food Insecurity: No Food Insecurity (09/19/2023)   Hunger Vital Sign    Worried About Running Out of Food in the Last Year: Never true    Ran Out of Food in the Last Year: Never true  Transportation Needs: No Transportation Needs (09/19/2023)   PRAPARE - Administrator, Civil Service (Medical): No    Lack of Transportation (Non-Medical): No  Physical Activity: Not on file  Stress: Not on file  Social Connections: Not on file  Intimate Partner Violence: Not At Risk (09/19/2023)   Humiliation, Afraid, Rape, and Kick questionnaire    Fear of Current or Ex-Partner: No    Emotionally Abused: No    Physically Abused: No    Sexually Abused: No    FH:  Family History  Problem Relation Age of Onset   Cancer Mother        uterine cancer    Past Medical History:  Diagnosis Date   Arthritis    RHEUMATOID   CHF (congestive heart failure) (HCC)    CLL (chronic lymphocytic leukemia) (HCC)    Edema    MILD ANKLE OCCAS   Fibromyalgia  GERD (gastroesophageal reflux disease)    Hypertension    Kidney stones    RLS (restless legs syndrome)     Current Outpatient Medications  Medication Sig Dispense Refill   amitriptyline (ELAVIL) 10 MG tablet Take 20 mg by mouth at bedtime.     cyanocobalamin (,VITAMIN B-12,) 1000 MCG/ML injection Inject into the muscle every 14 (fourteen) days.     cyanocobalamin (VITAMIN B12) 1000 MCG tablet Take 1,000 mcg by mouth daily.     DULoxetine (CYMBALTA) 20 MG capsule Take 20 mg by mouth daily.     feeding supplement, ENSURE ENLIVE, (ENSURE ENLIVE) LIQD Take 237 mLs by mouth 2 (two) times daily between  meals. 237 mL 12   furosemide (LASIX) 20 MG tablet Take 2 tablets (40 mg total) by mouth daily. 30 tablet 0   HYDROcodone-acetaminophen (NORCO) 10-325 MG tablet Take 1 tablet by mouth every 6 (six) hours as needed.     methylPREDNISolone (MEDROL DOSEPAK) 4 MG TBPK tablet Use as directed. (Patient not taking: Reported on 10/09/2023) 21 tablet 1   No current facility-administered medications for this visit.   Vitals:   10/09/23 1117  BP: (!) 142/43  Pulse: (!) 58  SpO2: 100%  Weight: 101 lb (45.8 kg)   Wt Readings from Last 3 Encounters:  10/09/23 101 lb (45.8 kg)  10/09/23 100 lb 9.6 oz (45.6 kg)  09/25/23 103 lb (46.7 kg)   Lab Results  Component Value Date   CREATININE 1.33 (H) 10/09/2023   CREATININE 1.51 (H) 09/25/2023   CREATININE 1.59 (H) 09/21/2023   PHYSICAL EXAM:  General:  Well appearing. No resp difficulty HEENT: normal Neck: supple. JVP elevated. Enlarged lymph nodes bilaterally Cor: PMI normal. Regular rhythm, bradycardic. No rubs, gallops or murmurs. Lungs: clear Abdomen: soft, nontender, nondistended. No hepatosplenomegaly. No bruits or masses.  Extremities: no cyanosis, clubbing, rash, edema Neuro: alert & oriented x3, cranial nerves grossly intact. Moves all 4 extremities w/o difficulty. Affect pleasant.  ECG: not done   ASSESSMENT & PLAN:  1: NICM with mildly reduced ejection fraction- - likely HTN/ CLL - NYHA class II - euvolemic - weighing daily; reminded to call for an overnight weight gain of > 2 pounds or a weekly weight gain of > 5 pounds - weight down 2 pounds from last visit here 6 weeks ago - Echo 05/30/22: EF of 45-50% along with mild/moderate MR - Echo 06/19/23: EF 40-45% along with mild LVH, Grade II DD, mildly elevated PA pressure, mild/ moderate MR with moderate AS with Aortic valve area, by VTI measures 1.00 cm. Aortic valve mean gradient measures 13.0 mmHg.  - watching her sodium intake closely - continue furosemide 40mg  daily -  entresto remains on hold due to BP - has had elevated K+ in the past so unable to use spironolactone - history of recurrent UTI so will not be a candidate for SGLT2 - BNP 09/19/23 was 1417.2  2: HTN- - BP 142/43 - saw PCP Burnett Sheng) 04/24 - BMP 09/25/23 showed sodium 136, potassium 5.1, creatinine 1.51 and GFR 33  3: Anemia- - taking oral B12 daily and injection every 2 weeks - hemoglobin 09/25/23 was 9.0 - vitamin B12 level 04/30/23 was 1164 - had CBC drawn earlier today and may be getting blood transfusion tomorrow  4: CLL- - saw oncology Freida Busman) 10/24 - WBC 09/25/23 was 74.6  Return in 2 months, sooner if needed.

## 2023-10-10 ENCOUNTER — Other Ambulatory Visit: Payer: Self-pay

## 2023-10-10 ENCOUNTER — Inpatient Hospital Stay: Payer: Medicare Other

## 2023-10-10 DIAGNOSIS — D649 Anemia, unspecified: Secondary | ICD-10-CM

## 2023-10-10 DIAGNOSIS — C9110 Chronic lymphocytic leukemia of B-cell type not having achieved remission: Secondary | ICD-10-CM | POA: Diagnosis not present

## 2023-10-10 MED ORDER — SODIUM CHLORIDE 0.9% FLUSH
10.0000 mL | INTRAVENOUS | Status: AC | PRN
  Administered 2023-10-10: 10 mL
  Filled 2023-10-10: qty 10

## 2023-10-10 MED ORDER — DIPHENHYDRAMINE HCL 25 MG PO CAPS
25.0000 mg | ORAL_CAPSULE | Freq: Once | ORAL | Status: AC
  Administered 2023-10-10: 25 mg via ORAL
  Filled 2023-10-10: qty 1

## 2023-10-10 MED ORDER — SODIUM CHLORIDE 0.9% IV SOLUTION
250.0000 mL | INTRAVENOUS | Status: DC
  Administered 2023-10-10: 250 mL via INTRAVENOUS
  Filled 2023-10-10: qty 250

## 2023-10-10 MED ORDER — ACETAMINOPHEN 325 MG PO TABS
650.0000 mg | ORAL_TABLET | Freq: Once | ORAL | Status: AC
  Administered 2023-10-10: 650 mg via ORAL
  Filled 2023-10-10: qty 2

## 2023-10-10 NOTE — Patient Instructions (Signed)

## 2023-10-11 LAB — BPAM RBC
Blood Product Expiration Date: 202410312359
ISSUE DATE / TIME: 202410230940
Unit Type and Rh: 5100

## 2023-10-11 LAB — TYPE AND SCREEN
ABO/RH(D): O POS
Antibody Screen: POSITIVE
DAT, IgG: POSITIVE
DAT, complement: POSITIVE
Donor AG Type: NEGATIVE
Unit division: 0

## 2023-10-22 ENCOUNTER — Other Ambulatory Visit: Payer: Self-pay | Admitting: *Deleted

## 2023-10-22 DIAGNOSIS — I509 Heart failure, unspecified: Secondary | ICD-10-CM

## 2023-10-22 DIAGNOSIS — D649 Anemia, unspecified: Secondary | ICD-10-CM

## 2023-10-23 ENCOUNTER — Inpatient Hospital Stay: Payer: Medicare Other | Admitting: Nurse Practitioner

## 2023-10-23 ENCOUNTER — Inpatient Hospital Stay: Payer: Medicare Other

## 2023-10-24 ENCOUNTER — Other Ambulatory Visit: Payer: Medicare Other

## 2023-10-24 ENCOUNTER — Ambulatory Visit: Payer: Medicare Other | Admitting: Nurse Practitioner

## 2023-10-24 ENCOUNTER — Inpatient Hospital Stay: Payer: Medicare Other

## 2023-10-25 ENCOUNTER — Inpatient Hospital Stay: Payer: Medicare Other | Attending: Internal Medicine

## 2023-10-25 ENCOUNTER — Ambulatory Visit: Payer: Medicare Other

## 2023-10-25 ENCOUNTER — Encounter: Payer: Self-pay | Admitting: Nurse Practitioner

## 2023-10-25 ENCOUNTER — Inpatient Hospital Stay: Payer: Medicare Other | Admitting: Nurse Practitioner

## 2023-10-25 VITALS — BP 147/35 | HR 62 | Temp 97.9°F | Wt 98.0 lb

## 2023-10-25 DIAGNOSIS — I110 Hypertensive heart disease with heart failure: Secondary | ICD-10-CM | POA: Diagnosis not present

## 2023-10-25 DIAGNOSIS — M1990 Unspecified osteoarthritis, unspecified site: Secondary | ICD-10-CM | POA: Diagnosis not present

## 2023-10-25 DIAGNOSIS — D649 Anemia, unspecified: Secondary | ICD-10-CM | POA: Diagnosis not present

## 2023-10-25 DIAGNOSIS — Z79899 Other long term (current) drug therapy: Secondary | ICD-10-CM | POA: Insufficient documentation

## 2023-10-25 DIAGNOSIS — C9110 Chronic lymphocytic leukemia of B-cell type not having achieved remission: Secondary | ICD-10-CM

## 2023-10-25 DIAGNOSIS — I509 Heart failure, unspecified: Secondary | ICD-10-CM | POA: Diagnosis not present

## 2023-10-25 LAB — CBC WITH DIFFERENTIAL (CANCER CENTER ONLY)
Abs Immature Granulocytes: 0.15 10*3/uL — ABNORMAL HIGH (ref 0.00–0.07)
Basophils Absolute: 0.1 10*3/uL (ref 0.0–0.1)
Basophils Relative: 0 %
Eosinophils Absolute: 0.9 10*3/uL — ABNORMAL HIGH (ref 0.0–0.5)
Eosinophils Relative: 1 %
HCT: 29.8 % — ABNORMAL LOW (ref 36.0–46.0)
Hemoglobin: 9.2 g/dL — ABNORMAL LOW (ref 12.0–15.0)
Immature Granulocytes: 0 %
Lymphocytes Relative: 89 %
Lymphs Abs: 55.2 10*3/uL — ABNORMAL HIGH (ref 0.7–4.0)
MCH: 30.9 pg (ref 26.0–34.0)
MCHC: 30.9 g/dL (ref 30.0–36.0)
MCV: 100 fL (ref 80.0–100.0)
Monocytes Absolute: 2.5 10*3/uL — ABNORMAL HIGH (ref 0.1–1.0)
Monocytes Relative: 4 %
Neutro Abs: 3.6 10*3/uL (ref 1.7–7.7)
Neutrophils Relative %: 6 %
Platelet Count: 188 10*3/uL (ref 150–400)
RBC: 2.98 MIL/uL — ABNORMAL LOW (ref 3.87–5.11)
RDW: 20.8 % — ABNORMAL HIGH (ref 11.5–15.5)
Smear Review: NORMAL
WBC Count: 62.3 10*3/uL (ref 4.0–10.5)
nRBC: 0 % (ref 0.0–0.2)

## 2023-10-25 LAB — SAMPLE TO BLOOD BANK

## 2023-10-25 LAB — BASIC METABOLIC PANEL - CANCER CENTER ONLY
Anion gap: 8 (ref 5–15)
BUN: 36 mg/dL — ABNORMAL HIGH (ref 8–23)
CO2: 26 mmol/L (ref 22–32)
Calcium: 8.8 mg/dL — ABNORMAL LOW (ref 8.9–10.3)
Chloride: 108 mmol/L (ref 98–111)
Creatinine: 1.47 mg/dL — ABNORMAL HIGH (ref 0.44–1.00)
GFR, Estimated: 34 mL/min — ABNORMAL LOW (ref >60)
Glucose, Bld: 106 mg/dL — ABNORMAL HIGH (ref 70–99)
Potassium: 4.4 mmol/L (ref 3.5–5.1)
Sodium: 142 mmol/L (ref 135–145)

## 2023-10-25 LAB — BRAIN NATRIURETIC PEPTIDE: B Natriuretic Peptide: 719 pg/mL — ABNORMAL HIGH (ref 0.0–100.0)

## 2023-10-25 NOTE — Progress Notes (Signed)
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 Low level hypermetabolism consistent with CLL.2. Splenomegaly but no focal splenic lesions.  # April 2022- 13 Q DEL; IGVH- UNMUTATED  # MID April 2022- IBRUTINIB 280 mg x3 days [STOPPED sec ER/hypertensive emergency- 191/129]  # MAY 26th, 2022- start acalabrutinib 100 mg once a day [reduced dose]; intermittent; discontinued  September mid 2022. [Muscle cramps.]  # MAY 1st week- 2024- Zanubrutinib 80 mg; stopped after 1-2 weeks- sec to acute renal failure hyperkalemia/extreme fatigue etc.  Patient declined further therapies.       CLL (chronic lymphocytic leukemia) (HCC)  02/14/2021 Initial Diagnosis   CLL (chronic lymphocytic leukemia) (HCC)   04/07/2021 Cancer Staging   Staging form: Chronic Lymphocytic Leukemia / Small Lymphocytic Lymphoma, AJCC 8th Edition - Clinical: Modified Rai Stage III (Modified Rai risk: High, Binet: Stage C) - Signed by Earna Coder, MD on 04/07/2021 Stage prefix: Initial diagnosis   12/16/2021 -  Chemotherapy   Patient is on Treatment Plan : CLL - Rituximab q 4 weeks      HISTORY OF PRESENTING ILLNESS: Frail-appearing Caucasian female patient/accompanied by daughter.  Ambulating independently.  [name] 89 y.o. female patient with symptomatic CLL currently on on surveillance, supportive care, because of intolerance  to ibrutinib/acalabrutinib/zanubrutinib is here for follow-up and possible transfusion. Her great grandchild is due in 2 weeks. She feels well and denies new complaints. Her arthritis is chronic and unchanged. Continues to have increased lymphadenopathy under her arms which is relatively stable over past weeks. She has been eating lots of beets to hopefully avoid transfusions. Weight has decreased.    Review of Systems  Constitutional:  Positive for malaise/fatigue and weight loss. Negative for chills, diaphoresis and fever.  HENT:  Negative for nosebleeds and sore throat.   Eyes:  Negative for double vision.  Respiratory:  Negative for cough, hemoptysis, sputum production, shortness of breath and wheezing.   Cardiovascular:  Negative for chest pain, palpitations, orthopnea and leg swelling.  Gastrointestinal:  Negative for abdominal pain, constipation, diarrhea, heartburn, melena, nausea and vomiting.  Genitourinary:  Negative for dysuria, frequency and urgency.  Musculoskeletal:  Positive for joint pain. Negative for back pain.  Skin: Negative.  Negative for itching and rash.  Neurological:  Negative for dizziness, tingling, focal weakness, weakness and headaches.  Endo/Heme/Allergies:  Bruises/bleeds easily.  Psychiatric/Behavioral:  Negative for depression. The patient is not nervous/anxious and does not have insomnia.      MEDICAL HISTORY:  Past Medical History:  Diagnosis Date   Arthritis    RHEUMATOID   CHF (congestive heart failure) (HCC)    CLL (chronic lymphocytic leukemia) (HCC)    Edema    MILD ANKLE OCCAS   Fibromyalgia    GERD (gastroesophageal reflux disease)    Hypertension    Kidney stones    RLS (restless legs syndrome)     SURGICAL HISTORY: Past Surgical History:  Procedure Laterality Date   ABDOMINAL HYSTERECTOMY     CATARACT EXTRACTION W/PHACO Left 02/07/2016  Procedure: CATARACT EXTRACTION PHACO AND INTRAOCULAR LENS PLACEMENT (IOC);  Surgeon: Sallee Lange, MD;  Location: [facility];  Service: Ophthalmology;  Laterality: Left;  [place] 01:33 AP% 24.7 CDE 39.57 fluid pack lot # [id number]   EXTRACORPOREAL SHOCK WAVE LITHOTRIPSY Right 07/03/2019   Procedure: EXTRACORPOREAL SHOCK WAVE LITHOTRIPSY (ESWL);  Surgeon: Orson Ape, MD;  Location: [facility];  Service: Urology;  Laterality: Right;   EYE SURGERY     HIP ARTHROPLASTY Left 04/20/2020   Procedure: ARTHROPLASTY BIPOLAR HIP (HEMIARTHROPLASTY);  Surgeon: Kennedy Bucker, MD;  Location: [facility];  Service: Orthopedics;  Laterality: Left;   LITHOTRIPSY     TONSILLECTOMY      SOCIAL HISTORY: Social History   Socioeconomic History   Marital status: Widowed    Spouse name: Not on file   Number of children: Not on file   Years of education: Not on file   Highest education level: Not on file  Occupational History   Not on file  Tobacco Use   Smoking status: Never   Smokeless tobacco: Never  Substance and Sexual Activity   Alcohol use: No   Drug use: Never   Sexual activity: Not Currently  Other Topics Concern   Not on file  Social History Narrative   Lives with daughter, in [place]. 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    Social Determinants of Health   Financial Resource Strain: Not on file  Food Insecurity: No Food Insecurity (09/19/2023)   Hunger Vital Sign    Worried About Running Out of Food in the Last Year: Never true    Ran Out of Food in the Last Year: Never true  Transportation Needs: No Transportation Needs (09/19/2023)   PRAPARE - Administrator, Civil Service (Medical): No    Lack of Transportation (Non-Medical): No  Physical Activity: Not on file  Stress: Not on file  Social Connections: Not on file  Intimate Partner Violence: Not At Risk (09/19/2023)   Humiliation, Afraid, Rape, and Kick questionnaire    Fear of Current or Ex-Partner: No    Emotionally Abused: No    Physically Abused: No    Sexually Abused: No    FAMILY  HISTORY: Family History  Problem Relation Age of Onset   Cancer Mother        uterine cancer    ALLERGIES:  is allergic to gabapentin and demerol [meperidine].  MEDICATIONS:  Current Outpatient Medications  Medication Sig Dispense Refill   amitriptyline (ELAVIL) 10 MG tablet Take 20 mg by mouth at bedtime.     cyanocobalamin (,VITAMIN B-12,) 1000 MCG/ML injection Inject into the muscle every 14 (fourteen) days.     cyanocobalamin (VITAMIN B12) 1000 MCG tablet Take 1,000 mcg by mouth daily.     DULoxetine (CYMBALTA) 20 MG capsule Take 20 mg by mouth daily.     feeding supplement, ENSURE ENLIVE, (ENSURE ENLIVE) LIQD Take 237 mLs by mouth 2 (two) times daily between meals. 237 mL 12   furosemide (LASIX) 20 MG tablet Take 2 tablets (40 mg total) by mouth daily. 60 tablet 5   HYDROcodone-acetaminophen (NORCO) 10-325 MG tablet Take 1 tablet by mouth every 6 (six) hours as needed.     No current facility-administered medications for this visit.    PHYSICAL EXAMINATION: ECOG PERFORMANCE STATUS: 2 - Symptomatic, <50% confined to bed Vitals:   10/25/23 1058  BP: (!) 147/35  Pulse: 62  Temp: 97.9 F (36.6 C)  SpO2: 100%   Filed Weights  10/25/23 1058  Weight: 98 lb (44.5 kg)   Physical Exam Constitutional:      Appearance: She is not ill-appearing.  HENT:     Head: Normocephalic and atraumatic.  Cardiovascular:     Rate and Rhythm: Normal rate and regular rhythm.  Pulmonary:     Effort: Pulmonary effort is normal.  Abdominal:     Palpations: Abdomen is soft.     Tenderness: There is no abdominal tenderness.  Musculoskeletal:     Comments: ambulating  Lymphadenopathy:     Comments: Bulky bilateral neck adenopathy; underarm adenopathy.  Skin:    General: Skin is warm.     Coloration: Skin is pale.     Comments: Chronic multiple bruises noted.  Neurological:     Mental Status: She is alert and oriented to person, place, and time.  Psychiatric:        Mood and Affect:  Mood and affect normal.        Behavior: Behavior normal.    LABORATORY DATA:  I have reviewed the data as listed Lab Results  Component Value Date   WBC 62.3 (HH) 10/25/2023   HGB 9.2 (L) 10/25/2023   HCT 29.8 (L) 10/25/2023   MCV 100.0 10/25/2023   PLT 188 10/25/2023   Recent Labs    05/07/23 1300 06/18/23 0631 06/19/23 0415 09/25/23 0855 10/09/23 0954 10/25/23 1032  NA 139 141   < > 136 138 142  K 3.9 3.9   < > 5.1 4.4 4.4  CL 109 113*   < > 106 107 108  CO2 21* 18*   < > 22 25 26   GLUCOSE 115* 116*   < > 97 110* 106*  BUN 36* 32*   < > 40* 32* 36*  CREATININE 1.27* 1.33*   < > 1.51* 1.33* 1.47*  CALCIUM 8.2* 8.4*   < > 8.7* 8.4* 8.8*  GFRNONAA 41* 39*   < > 33* 39* 34*  PROT 5.8* 5.6*  --  6.2*  --   --   ALBUMIN 3.7 3.8  --  4.0  --   --   AST 15 16  --  15  --   --   ALT 9 10  --  8  --   --   ALKPHOS 79 84  --  101  --   --   BILITOT 0.5 0.7  --  1.1  --   --    < > = values in this interval not displayed.   Iron/TIBC/Ferritin/ %Sat    Component Value Date/Time   IRON 27 (L) 09/20/2023 0250   TIBC 239 (L) 09/20/2023 0250   FERRITIN 40 09/20/2023 0250   IRONPCTSAT 11 09/20/2023 0250     RADIOGRAPHIC STUDIES: I have personally reviewed the radiological images as listed and agreed with the findings in the report. No results found.  ASSESSMENT & PLAN:   CLL (chronic lymphocytic leukemia) (HCC) # CLL: [56% OF NUCLEI POSITIVE FOR A 13Q DELETION]; April 2022-PET scan shows significant lymphadenopathy above and below diaphragm; splenomegaly. Discontinued Ibrutinib [hypertensive emergencies] acalabrutinib [leg cramps even with minimum dose]. Patient tolerated zanubrutinib poorly-with hypokalemia loss and renal failure/extreme fatigue etc.  She discontinued zanubrutinib. Discussed option of rituximab infusion weekly x 4- and other options. Declined d/t concern of her frailty and potential side effects. No additional treatments plans. Opted for supportive care.     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  Hemoglobin today is 9.2  Hold  off any transfusion.  Will plan PRBC supportive care [grand baby in end of nov].    # Arthritis- waxes and wanes. Can consider steroids if needed for palliation.   # Cardiac -history of CHF / elevated blood pressure-overall stable continue monitoring at home.   # Worsening renal function GFR 40s.  Underlying diuretic/CHF/possible tumor lysis- stable.    # Prognosis: She plans for supportive care including transfusions for now and then will transition to hospice when she is ready or over next few months.    DISPOSITION: Cancel blood tomorrow 2 weeks (any day; prefers 11:30 appt time)- lab, see me Day later +/- pRBCs- la     No problem-specific Assessment & Plan notes found for this encounter. All questions were answered. The patient knows to call the clinic with any problems, questions or concerns.    Lindsay Dooms, NP 10/25/2023

## 2023-10-26 ENCOUNTER — Inpatient Hospital Stay: Payer: Medicare Other

## 2023-11-06 ENCOUNTER — Inpatient Hospital Stay: Payer: Medicare Other

## 2023-11-06 ENCOUNTER — Encounter: Payer: Self-pay | Admitting: Nurse Practitioner

## 2023-11-06 ENCOUNTER — Inpatient Hospital Stay (HOSPITAL_BASED_OUTPATIENT_CLINIC_OR_DEPARTMENT_OTHER): Payer: Medicare Other | Admitting: Nurse Practitioner

## 2023-11-06 VITALS — BP 137/37 | HR 64 | Temp 98.6°F | Resp 20 | Wt 101.1 lb

## 2023-11-06 DIAGNOSIS — D649 Anemia, unspecified: Secondary | ICD-10-CM | POA: Diagnosis not present

## 2023-11-06 DIAGNOSIS — C9110 Chronic lymphocytic leukemia of B-cell type not having achieved remission: Secondary | ICD-10-CM

## 2023-11-06 LAB — SAMPLE TO BLOOD BANK

## 2023-11-06 LAB — CBC WITH DIFFERENTIAL/PLATELET
Abs Immature Granulocytes: 0.1 10*3/uL — ABNORMAL HIGH (ref 0.00–0.07)
Basophils Absolute: 0.1 10*3/uL (ref 0.0–0.1)
Basophils Relative: 0 %
Eosinophils Absolute: 0.7 10*3/uL — ABNORMAL HIGH (ref 0.0–0.5)
Eosinophils Relative: 2 %
HCT: 28 % — ABNORMAL LOW (ref 36.0–46.0)
Hemoglobin: 8.7 g/dL — ABNORMAL LOW (ref 12.0–15.0)
Immature Granulocytes: 0 %
Lymphocytes Relative: 86 %
Lymphs Abs: 41.1 10*3/uL — ABNORMAL HIGH (ref 0.7–4.0)
MCH: 31.8 pg (ref 26.0–34.0)
MCHC: 31.1 g/dL (ref 30.0–36.0)
MCV: 102.2 fL — ABNORMAL HIGH (ref 80.0–100.0)
Monocytes Absolute: 2.1 10*3/uL — ABNORMAL HIGH (ref 0.1–1.0)
Monocytes Relative: 5 %
Neutro Abs: 3.4 10*3/uL (ref 1.7–7.7)
Neutrophils Relative %: 7 %
Platelets: 170 10*3/uL (ref 150–400)
RBC: 2.74 MIL/uL — ABNORMAL LOW (ref 3.87–5.11)
RDW: 20.5 % — ABNORMAL HIGH (ref 11.5–15.5)
Smear Review: NORMAL
WBC: 47.6 10*3/uL — ABNORMAL HIGH (ref 4.0–10.5)
nRBC: 0 % (ref 0.0–0.2)

## 2023-11-06 LAB — BASIC METABOLIC PANEL - CANCER CENTER ONLY
Anion gap: 11 (ref 5–15)
BUN: 30 mg/dL — ABNORMAL HIGH (ref 8–23)
CO2: 24 mmol/L (ref 22–32)
Calcium: 8.6 mg/dL — ABNORMAL LOW (ref 8.9–10.3)
Chloride: 105 mmol/L (ref 98–111)
Creatinine: 1.59 mg/dL — ABNORMAL HIGH (ref 0.44–1.00)
GFR, Estimated: 31 mL/min — ABNORMAL LOW (ref >60)
Glucose, Bld: 101 mg/dL — ABNORMAL HIGH (ref 70–99)
Potassium: 4.4 mmol/L (ref 3.5–5.1)
Sodium: 140 mmol/L (ref 135–145)

## 2023-11-06 LAB — BRAIN NATRIURETIC PEPTIDE: B Natriuretic Peptide: 674.9 pg/mL — ABNORMAL HIGH (ref 0.0–100.0)

## 2023-11-06 NOTE — Progress Notes (Unsigned)
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 Low level hypermetabolism consistent with CLL.2. Splenomegaly but no focal splenic lesions.  # April 2022- 13 Q DEL; IGVH- UNMUTATED  # MID April 2022- IBRUTINIB 280 mg x3 days [STOPPED sec ER/hypertensive emergency- 191/129]  # MAY 26th, 2022- start acalabrutinib 100 mg once a day [reduced dose]; intermittent; discontinued  September mid 2022. [Muscle cramps.]  # MAY 1st week- 2024- Zanubrutinib 80 mg; stopped after 1-2 weeks- sec to acute renal failure hyperkalemia/extreme fatigue etc.  Patient declined further therapies.       CLL (chronic lymphocytic leukemia) (HCC)  02/14/2021 Initial Diagnosis   CLL (chronic lymphocytic leukemia) (HCC)   04/07/2021 Cancer Staging   Staging form: Chronic Lymphocytic Leukemia / Small Lymphocytic Lymphoma, AJCC 8th Edition - Clinical: Modified Rai Stage III (Modified Rai risk: High, Binet: Stage C) - Signed by Earna Coder, MD on 04/07/2021 Stage prefix: Initial diagnosis   12/16/2021 -  Chemotherapy   Patient is on Treatment Plan : CLL - Rituximab q 4 weeks      HISTORY OF PRESENTING ILLNESS: Frail-appearing Caucasian female patient/accompanied by daughter.  Ambulating independently.  Lindsay [name] 89 y.o. female patient with symptomatic CLL currently on on surveillance, supportive care, because of intolerance  to ibrutinib/acalabrutinib/zanubrutinib is here for follow-up and possible transfusion. Her great grandchild is due in 2 weeks. She feels well and denies new complaints. Her arthritis is chronic and unchanged. Continues to have increased lymphadenopathy under her arms which is relatively stable over past weeks. She has been eating lots of beets to hopefully avoid transfusions. Weight has decreased.    Review of Systems  Constitutional:  Positive for malaise/fatigue and weight loss. Negative for chills, diaphoresis and fever.  HENT:  Negative for nosebleeds and sore throat.   Eyes:  Negative for double vision.  Respiratory:  Negative for cough, hemoptysis, sputum production, shortness of breath and wheezing.   Cardiovascular:  Negative for chest pain, palpitations, orthopnea and leg swelling.  Gastrointestinal:  Negative for abdominal pain, constipation, diarrhea, heartburn, melena, nausea and vomiting.  Genitourinary:  Negative for dysuria, frequency and urgency.  Musculoskeletal:  Positive for joint pain. Negative for back pain.  Skin: Negative.  Negative for itching and rash.  Neurological:  Negative for dizziness, tingling, focal weakness, weakness and headaches.  Endo/Heme/Allergies:  Bruises/bleeds easily.  Psychiatric/Behavioral:  Negative for depression. The patient is not nervous/anxious and does not have insomnia.      MEDICAL HISTORY:  Past Medical History:  Diagnosis Date   Arthritis    RHEUMATOID   CHF (congestive heart failure) (HCC)    CLL (chronic lymphocytic leukemia) (HCC)    Edema    MILD ANKLE OCCAS   Fibromyalgia    GERD (gastroesophageal reflux disease)    Hypertension    Kidney stones    RLS (restless legs syndrome)     SURGICAL HISTORY: Past Surgical History:  Procedure Laterality Date   ABDOMINAL HYSTERECTOMY     CATARACT EXTRACTION W/PHACO Left 02/07/2016  Procedure: CATARACT EXTRACTION PHACO AND INTRAOCULAR LENS PLACEMENT (IOC);  Surgeon: Sallee Lange, MD;  Location: [facility];  Service: Ophthalmology;  Laterality: Left;  [place] 01:33 AP% 24.7 CDE 39.57 fluid pack lot # [id number]   EXTRACORPOREAL SHOCK WAVE LITHOTRIPSY Right 07/03/2019   Procedure: EXTRACORPOREAL SHOCK WAVE LITHOTRIPSY (ESWL);  Surgeon: Orson Ape, MD;  Location: [facility];  Service: Urology;  Laterality: Right;   EYE SURGERY     HIP ARTHROPLASTY Left 04/20/2020   Procedure: ARTHROPLASTY BIPOLAR HIP (HEMIARTHROPLASTY);  Surgeon: Kennedy Bucker, MD;  Location: [facility] OR[facility];  Service: Orthopedics;  Laterality: Left;   LITHOTRIPSY     TONSILLECTOMY      SOCIAL HISTORY: Social History   Socioeconomic History   Marital status: Widowed    Spouse name: Not on file   Number of children: Not on file   Years of education: Not on file   Highest education level: Not on file  Occupational History   Not on file  Tobacco Use   Smoking status: Never   Smokeless tobacco: Never  Substance and Sexual Activity   Alcohol use: No   Drug use: Never   Sexual activity: Not Currently  Other Topics Concern   Not on file  Social History Narrative   Lives with daughter, in [place]. 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    Social Determinants of Health   Financial Resource Strain: Not on file  Food Insecurity: No Food Insecurity (09/19/2023)   Hunger Vital Sign    Worried About Running Out of Food in the Last Year: Never true    Ran Out of Food in the Last Year: Never true  Transportation Needs: No Transportation Needs (09/19/2023)   PRAPARE - Administrator, Civil Service (Medical): No    Lack of Transportation (Non-Medical): No  Physical Activity: Not on file  Stress: Not on file  Social Connections: Not on file  Intimate Partner Violence: Not At Risk (09/19/2023)   Humiliation, Afraid, Rape, and Kick questionnaire    Fear of Current or Ex-Partner: No    Emotionally Abused: No    Physically Abused: No    Sexually Abused: No    FAMILY  HISTORY: Family History  Problem Relation Age of Onset   Cancer Mother        uterine cancer    ALLERGIES:  is allergic to gabapentin and demerol [meperidine].  MEDICATIONS:  Current Outpatient Medications  Medication Sig Dispense Refill   amitriptyline (ELAVIL) 10 MG tablet Take 20 mg by mouth at bedtime.     cyanocobalamin (,VITAMIN B-12,) 1000 MCG/ML injection Inject into the muscle every 14 (fourteen) days.     cyanocobalamin (VITAMIN B12) 1000 MCG tablet Take 1,000 mcg by mouth daily.     DULoxetine (CYMBALTA) 20 MG capsule Take 20 mg by mouth daily.     feeding supplement, ENSURE ENLIVE, (ENSURE ENLIVE) LIQD Take 237 mLs by mouth 2 (two) times daily between meals. 237 mL 12   furosemide (LASIX) 20 MG tablet Take 2 tablets (40 mg total) by mouth daily. 60 tablet 5   HYDROcodone-acetaminophen (NORCO) 10-325 MG tablet Take 1 tablet by mouth every 6 (six) hours as needed.     No current facility-administered medications for this visit.    PHYSICAL EXAMINATION: ECOG PERFORMANCE STATUS: 2 - Symptomatic, <50% confined to bed There were no vitals filed for this visit.  There were no vitals filed for this visit.  Physical Exam Constitutional:  Appearance: She is not ill-appearing.  HENT:     Head: Normocephalic and atraumatic.  Cardiovascular:     Rate and Rhythm: Normal rate and regular rhythm.  Pulmonary:     Effort: Pulmonary effort is normal.  Abdominal:     Palpations: Abdomen is soft.     Tenderness: There is no abdominal tenderness.  Musculoskeletal:     Comments: ambulating  Lymphadenopathy:     Comments: Bulky bilateral neck adenopathy; underarm adenopathy.  Skin:    General: Skin is warm.     Coloration: Skin is pale.     Comments: Chronic multiple bruises noted.  Neurological:     Mental Status: She is alert and oriented to person, place, and time.  Psychiatric:        Mood and Affect: Mood and affect normal.        Behavior: Behavior normal.     LABORATORY DATA:  I have reviewed the data as listed Lab Results  Component Value Date   WBC 62.3 (HH) 10/25/2023   HGB 9.2 (L) 10/25/2023   HCT 29.8 (L) 10/25/2023   MCV 100.0 10/25/2023   PLT 188 10/25/2023   Recent Labs    05/07/23 1300 06/18/23 0631 06/19/23 0415 09/25/23 0855 10/09/23 0954 10/25/23 1032  NA 139 141   < > 136 138 142  K 3.9 3.9   < > 5.1 4.4 4.4  CL 109 113*   < > 106 107 108  CO2 21* 18*   < > 22 25 26   GLUCOSE 115* 116*   < > 97 110* 106*  BUN 36* 32*   < > 40* 32* 36*  CREATININE 1.27* 1.33*   < > 1.51* 1.33* 1.47*  CALCIUM 8.2* 8.4*   < > 8.7* 8.4* 8.8*  GFRNONAA 41* 39*   < > 33* 39* 34*  PROT 5.8* 5.6*  --  6.2*  --   --   ALBUMIN 3.7 3.8  --  4.0  --   --   AST 15 16  --  15  --   --   ALT 9 10  --  8  --   --   ALKPHOS 79 84  --  101  --   --   BILITOT 0.5 0.7  --  1.1  --   --    < > = values in this interval not displayed.   Iron/TIBC/Ferritin/ %Sat    Component Value Date/Time   IRON 27 (L) 09/20/2023 0250   TIBC 239 (L) 09/20/2023 0250   FERRITIN 40 09/20/2023 0250   IRONPCTSAT 11 09/20/2023 0250     RADIOGRAPHIC STUDIES: I have personally reviewed the radiological images as listed and agreed with the findings in the report. No results found.  ASSESSMENT & PLAN:   CLL (chronic lymphocytic leukemia) (HCC) # CLL: [56% OF NUCLEI POSITIVE FOR A 13Q DELETION]; April 2022-PET scan shows significant lymphadenopathy above and below diaphragm; splenomegaly. Discontinued Ibrutinib [hypertensive emergencies] acalabrutinib [leg cramps even with minimum dose]. Patient tolerated zanubrutinib poorly-with hypokalemia loss and renal failure/extreme fatigue etc.  She discontinued zanubrutinib. Discussed option of rituximab infusion weekly x 4- and other options. Declined d/t concern of her frailty and potential side effects. No additional treatments plans. Opted for supportive care.    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  Hemoglobin today is 9.2  Hold off any transfusion.  Will plan PRBC supportive care [grand baby in end of nov].    #  Arthritis- waxes and wanes. Can consider steroids if needed for palliation.   # Cardiac -history of CHF / elevated blood pressure-overall stable continue monitoring at home.   # Worsening renal function GFR 40s.  Underlying diuretic/CHF/possible tumor lysis- stable.    # Prognosis: She plans for supportive care including transfusions for now and then will transition to hospice when she is ready or over next few months.    DISPOSITION: Cancel blood tomorrow 2 weeks (any day; prefers 11:30 appt time)- lab, see me Day later +/- pRBCs- la     No problem-specific Assessment & Plan notes found for this encounter. All questions were answered. The patient knows to call the clinic with any problems, questions or concerns.    Alinda Dooms, NP 11/06/2023

## 2023-11-07 ENCOUNTER — Encounter: Payer: Self-pay | Admitting: Internal Medicine

## 2023-11-09 ENCOUNTER — Inpatient Hospital Stay: Payer: Medicare Other

## 2023-11-22 ENCOUNTER — Inpatient Hospital Stay (HOSPITAL_BASED_OUTPATIENT_CLINIC_OR_DEPARTMENT_OTHER): Payer: Medicare Other | Admitting: Nurse Practitioner

## 2023-11-22 ENCOUNTER — Encounter: Payer: Self-pay | Admitting: Nurse Practitioner

## 2023-11-22 ENCOUNTER — Inpatient Hospital Stay: Payer: Medicare Other | Attending: Nurse Practitioner

## 2023-11-22 VITALS — BP 146/38 | HR 71 | Temp 97.2°F | Wt 100.8 lb

## 2023-11-22 DIAGNOSIS — D649 Anemia, unspecified: Secondary | ICD-10-CM

## 2023-11-22 DIAGNOSIS — D630 Anemia in neoplastic disease: Secondary | ICD-10-CM | POA: Diagnosis not present

## 2023-11-22 DIAGNOSIS — Z79899 Other long term (current) drug therapy: Secondary | ICD-10-CM | POA: Diagnosis not present

## 2023-11-22 DIAGNOSIS — C9110 Chronic lymphocytic leukemia of B-cell type not having achieved remission: Secondary | ICD-10-CM | POA: Diagnosis present

## 2023-11-22 LAB — CBC WITH DIFFERENTIAL (CANCER CENTER ONLY)
Abs Immature Granulocytes: 0.12 10*3/uL — ABNORMAL HIGH (ref 0.00–0.07)
Basophils Absolute: 0.1 10*3/uL (ref 0.0–0.1)
Basophils Relative: 0 %
Eosinophils Absolute: 0.9 10*3/uL — ABNORMAL HIGH (ref 0.0–0.5)
Eosinophils Relative: 2 %
HCT: 28 % — ABNORMAL LOW (ref 36.0–46.0)
Hemoglobin: 8.8 g/dL — ABNORMAL LOW (ref 12.0–15.0)
Immature Granulocytes: 0 %
Lymphocytes Relative: 86 %
Lymphs Abs: 41.6 10*3/uL — ABNORMAL HIGH (ref 0.7–4.0)
MCH: 32.7 pg (ref 26.0–34.0)
MCHC: 31.4 g/dL (ref 30.0–36.0)
MCV: 104.1 fL — ABNORMAL HIGH (ref 80.0–100.0)
Monocytes Absolute: 2 10*3/uL — ABNORMAL HIGH (ref 0.1–1.0)
Monocytes Relative: 4 %
Neutro Abs: 3.8 10*3/uL (ref 1.7–7.7)
Neutrophils Relative %: 8 %
Platelet Count: 156 10*3/uL (ref 150–400)
RBC: 2.69 MIL/uL — ABNORMAL LOW (ref 3.87–5.11)
RDW: 18.6 % — ABNORMAL HIGH (ref 11.5–15.5)
Smear Review: NORMAL
WBC Count: 48.4 10*3/uL — ABNORMAL HIGH (ref 4.0–10.5)
nRBC: 0 % (ref 0.0–0.2)

## 2023-11-22 LAB — BASIC METABOLIC PANEL - CANCER CENTER ONLY
Anion gap: 6 (ref 5–15)
BUN: 32 mg/dL — ABNORMAL HIGH (ref 8–23)
CO2: 25 mmol/L (ref 22–32)
Calcium: 8.6 mg/dL — ABNORMAL LOW (ref 8.9–10.3)
Chloride: 110 mmol/L (ref 98–111)
Creatinine: 1.33 mg/dL — ABNORMAL HIGH (ref 0.44–1.00)
GFR, Estimated: 39 mL/min — ABNORMAL LOW (ref >60)
Glucose, Bld: 136 mg/dL — ABNORMAL HIGH (ref 70–99)
Potassium: 4 mmol/L (ref 3.5–5.1)
Sodium: 141 mmol/L (ref 135–145)

## 2023-11-22 LAB — SAMPLE TO BLOOD BANK

## 2023-11-22 NOTE — Progress Notes (Signed)
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 Low level hypermetabolism consistent with CLL.2. Splenomegaly but no focal splenic lesions.  # April 2022- 13 Q DEL; IGVH- UNMUTATED  # MID April 2022- IBRUTINIB 280 mg x3 days [STOPPED sec ER/hypertensive emergency- 191/129]  # MAY 26th, 2022- start acalabrutinib 100 mg once a day [reduced dose]; intermittent; discontinued  September mid 2022. [Muscle cramps.]  # MAY 1st week- 2024- Zanubrutinib 80 mg; stopped after 1-2 weeks- sec to acute renal failure hyperkalemia/extreme fatigue etc.  Patient declined further therapies.       CLL (chronic lymphocytic leukemia) (HCC)  02/14/2021 Initial Diagnosis   CLL (chronic lymphocytic leukemia) (HCC)   04/07/2021 Cancer Staging   Staging form: Chronic Lymphocytic Leukemia / Small Lymphocytic Lymphoma, AJCC 8th Edition - Clinical: Modified Rai Stage III (Modified Rai risk: High, Binet: Stage C) - Signed by Earna Coder, MD on 04/07/2021 Stage prefix: Initial diagnosis   12/16/2021 -  Chemotherapy   Patient is on Treatment Plan : CLL - Rituximab q 4 weeks      HISTORY OF PRESENTING ILLNESS: Frail-appearing Caucasian female patient/accompanied by daughter.  Ambulating independently.  Lindsay [name] 89 y.o. female patient with symptomatic CLL currently on on surveillance, supportive care, because of intolerance  to ibrutinib/acalabrutinib/zanubrutinib is here for follow-up and possible transfusion. She's feeling well and denies worsening symptoms. Her energy level has been better and she has been cleaning a lot. Weight is stable. Lymphadenopathy is stable. Denies fevers, chills, or bothersome night sweats.    Review of Systems  Constitutional:  Positive for malaise/fatigue. Negative for chills, diaphoresis, fever and weight loss.  HENT:  Negative for nosebleeds and sore throat.   Eyes:  Negative for double vision.  Respiratory:  Negative for cough, hemoptysis, sputum production, shortness of breath and wheezing.   Cardiovascular:  Negative for chest pain, palpitations, orthopnea and leg swelling.  Gastrointestinal:  Negative for abdominal pain, constipation, diarrhea, heartburn, melena, nausea and vomiting.  Genitourinary:  Negative for dysuria, frequency and urgency.  Musculoskeletal:  Positive for joint pain. Negative for back pain.  Skin: Negative.  Negative for itching and rash.  Neurological:  Negative for dizziness, tingling, focal weakness, weakness and headaches.  Endo/Heme/Allergies:  Bruises/bleeds easily.  Psychiatric/Behavioral:  Negative for depression. The patient is not nervous/anxious and does not have insomnia.      MEDICAL HISTORY:  Past Medical History:  Diagnosis Date   Arthritis    RHEUMATOID   CHF (congestive heart failure) (HCC)    CLL (chronic lymphocytic leukemia) (HCC)    Edema    MILD ANKLE OCCAS   Fibromyalgia    GERD (gastroesophageal reflux disease)    Hypertension    Kidney stones    RLS (restless legs syndrome)     SURGICAL HISTORY: Past Surgical History:  Procedure Laterality Date   ABDOMINAL HYSTERECTOMY     CATARACT EXTRACTION W/PHACO Left 02/07/2016   Procedure: CATARACT EXTRACTION PHACO AND INTRAOCULAR LENS PLACEMENT (IOC);  Surgeon: Sallee Lange, MD;  Location:  [facility] ORS;  Service: Ophthalmology;  Laterality: Left;  [place] 01:33 AP% 24.7 CDE  39.57 fluid pack lot # [id number]   EXTRACORPOREAL SHOCK WAVE LITHOTRIPSY Right 07/03/2019   Procedure: EXTRACORPOREAL SHOCK WAVE LITHOTRIPSY (ESWL);  Surgeon: Orson Ape, MD;  Location: [facility] ORS;  Service: Urology;  Laterality: Right;   EYE SURGERY     HIP ARTHROPLASTY Left 04/20/2020   Procedure: ARTHROPLASTY BIPOLAR HIP (HEMIARTHROPLASTY);  Surgeon: Kennedy Bucker, MD;  Location: [facility] ORS;  Service: Orthopedics;  Laterality: Left;   LITHOTRIPSY     TONSILLECTOMY      SOCIAL HISTORY: Social History   Socioeconomic History   Marital status: Widowed    Spouse name: Not on file   Number of children: Not on file   Years of education: Not on file   Highest education level: Not on file  Occupational History   Not on file  Tobacco Use   Smoking status: Never   Smokeless tobacco: Never  Substance and Sexual Activity   Alcohol use: No   Drug use: Never   Sexual activity: Not Currently  Other Topics Concern   Not on file  Social History Narrative   Lives with daughter, in [place]. 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    Social Determinants of Health   Financial Resource Strain: Not on file  Food Insecurity: No Food Insecurity (09/19/2023)   Hunger Vital Sign    Worried About Running Out of Food in the Last Year: Never true    Ran Out of Food in the Last Year: Never true  Transportation Needs: No Transportation Needs (09/19/2023)   PRAPARE - Administrator, Civil Service (Medical): No    Lack of Transportation (Non-Medical): No  Physical Activity: Not on file  Stress: Not on file  Social Connections: Not on file  Intimate Partner Violence: Not At Risk (09/19/2023)   Humiliation, Afraid, Rape, and Kick questionnaire    Fear of Current or Ex-Partner: No    Emotionally Abused: No    Physically Abused: No    Sexually Abused: No    FAMILY HISTORY: Family History  Problem Relation Age of Onset   Cancer Mother        uterine cancer    ALLERGIES:   is allergic to gabapentin and demerol [meperidine].  MEDICATIONS:  Current Outpatient Medications  Medication Sig Dispense Refill   amitriptyline (ELAVIL) 10 MG tablet Take 20 mg by mouth at bedtime.     cyanocobalamin (,VITAMIN B-12,) 1000 MCG/ML injection Inject into the muscle every 14 (fourteen) days.     cyanocobalamin (VITAMIN B12) 1000 MCG tablet Take 1,000 mcg by mouth daily.     DULoxetine (CYMBALTA) 20 MG capsule Take 20 mg by mouth daily.     feeding supplement, ENSURE ENLIVE, (ENSURE ENLIVE) LIQD Take 237 mLs by mouth 2 (two) times daily between meals. 237 mL 12   furosemide (LASIX) 20 MG tablet Take 2 tablets (40 mg total) by mouth daily. 60 tablet 5   HYDROcodone-acetaminophen (NORCO) 10-325 MG tablet Take 1 tablet by mouth every 6 (six) hours as needed.     No current facility-administered medications for this visit.    PHYSICAL EXAMINATION: ECOG PERFORMANCE STATUS: 2 - Symptomatic, <50% confined to bed Vitals:   11/22/23 1337  BP: (!) 146/38  Pulse: 71  Temp: (!) 97.2 F (36.2 C)  SpO2: 100%   Filed Weights   11/22/23 1337  Weight: 100 lb 12.8 oz (45.7 kg)   Physical Exam  Vitals reviewed.  Constitutional:      Appearance: She is not ill-appearing.  HENT:     Head: Normocephalic and atraumatic.  Cardiovascular:     Rate and Rhythm: Normal rate and regular rhythm.  Pulmonary:     Effort: Pulmonary effort is normal.  Abdominal:     Palpations: Abdomen is soft.     Tenderness: There is no abdominal tenderness.  Musculoskeletal:     Comments: ambulating  Lymphadenopathy:     Comments: Bulky bilateral neck adenopathy; underarm adenopathy.  Skin:    General: Skin is warm.     Coloration: Skin is not pale.     Comments: Chronic multiple bruises noted.  Neurological:     Mental Status: She is alert and oriented to person, place, and time.  Psychiatric:        Mood and Affect: Mood and affect normal.        Behavior: Behavior normal.    LABORATORY DATA:   I have reviewed the data as listed Lab Results  Component Value Date   WBC 48.4 (H) 11/22/2023   HGB 8.8 (L) 11/22/2023   HCT 28.0 (L) 11/22/2023   MCV 104.1 (H) 11/22/2023   PLT 156 11/22/2023   Recent Labs    05/07/23 1300 06/18/23 0631 06/19/23 0415 09/25/23 0855 10/09/23 0954 10/25/23 1032 11/06/23 1136 11/22/23 1317  NA 139 141   < > 136   < > 142 140 141  K 3.9 3.9   < > 5.1   < > 4.4 4.4 4.0  CL 109 113*   < > 106   < > 108 105 110  CO2 21* 18*   < > 22   < > 26 24 25   GLUCOSE 115* 116*   < > 97   < > 106* 101* 136*  BUN 36* 32*   < > 40*   < > 36* 30* 32*  CREATININE 1.27* 1.33*   < > 1.51*   < > 1.47* 1.59* 1.33*  CALCIUM 8.2* 8.4*   < > 8.7*   < > 8.8* 8.6* 8.6*  GFRNONAA 41* 39*   < > 33*   < > 34* 31* 39*  PROT 5.8* 5.6*  --  6.2*  --   --   --   --   ALBUMIN 3.7 3.8  --  4.0  --   --   --   --   AST 15 16  --  15  --   --   --   --   ALT 9 10  --  8  --   --   --   --   ALKPHOS 79 84  --  101  --   --   --   --   BILITOT 0.5 0.7  --  1.1  --   --   --   --    < > = values in this interval not displayed.   Iron/TIBC/Ferritin/ %Sat    Component Value Date/Time   IRON 27 (L) 09/20/2023 0250   TIBC 239 (L) 09/20/2023 0250   FERRITIN 40 09/20/2023 0250   IRONPCTSAT 11 09/20/2023 0250    RADIOGRAPHIC STUDIES: I have personally reviewed the radiological images as listed and agreed with the findings in the report. No results found.  ASSESSMENT & PLAN:   CLL (chronic lymphocytic leukemia)  # CLL: [56% OF NUCLEI POSITIVE FOR A 13Q DELETION]; April 2022-PET scan shows significant lymphadenopathy above and below diaphragm;  splenomegaly. Discontinued Ibrutinib [hypertensive emergencies] acalabrutinib [leg cramps even with minimum dose]. Patient tolerated zanubrutinib poorly-with hypokalemia loss and renal failure/extreme fatigue etc.  She discontinued zanubrutinib. Discussed option of rituximab infusion weekly x 4- and other options. Declined d/t concern of her  frailty and potential side effects. No additional treatments plans. Opted for supportive care.    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  Hemoglobin today is 8.8  Hold off any transfusion.  Will plan PRBC supportive care. Clinically she receives approximately once a month for hemoglobin < 8.    # Arthritis- waxes and wanes. Can consider steroids if needed for palliation.   # Cardiac - history of CHF. Diastolic decreased. Clinically she is asymptomatic. Follow up with cardiology.    # Worsening renal function GFR 40s.  Underlying diuretic/CHF/possible tumor lysis- stable.    # Prognosis: She plans for supportive care including transfusions for now and then will transition to hospice when she is ready or over next few months. She has excitedly welcomed the birth of her new great grand-son. Plan to add palliative care visit at next visit to provide ACP documents.    DISPOSITION: Cancel blood tomorrow 4 weeks- (any day; prefers 11:30 appt time)- lab, see me Day later +/- pRBCs- la    No problem-specific Assessment & Plan notes found for this encounter.  All questions were answered. The patient knows to call the clinic with any problems, questions or concerns.   Alinda Dooms, NP 11/22/2023

## 2023-11-23 ENCOUNTER — Inpatient Hospital Stay: Payer: Medicare Other

## 2023-11-23 ENCOUNTER — Other Ambulatory Visit: Payer: Self-pay

## 2023-11-29 ENCOUNTER — Encounter: Payer: Self-pay | Admitting: Family

## 2023-11-29 ENCOUNTER — Telehealth: Payer: Self-pay | Admitting: Family

## 2023-11-29 NOTE — Progress Notes (Signed)
PCP: Jerl Mina, MD (last seen 11/24) Primary Cardiologist: None  HPI:  Lindsay [name] is a 89 y/o female with a history of HTN, CKD, CLL (not taking chemo), anemia, LBBB, IBS, fibromyalgia, GERD, RLS and heart failure. She was unable to tolerate treatment of CLL with zanubrutinib due to hyperkalemia, renal failure and extreme fatigue and has elected to stop any other treatment for this.  Admitted 06/18/23 due to shortness of breath due to HF exacerbation. Chest x-ray showed cardiomegaly and interstitial edema. BNP 1254, troponin 80--> 116. IV diuresed. Echo showed slight drop in EF to 40-45% from previous 45-50%. Admitted 09/19/23 due to shortness of breath. Had SIRS without infection likely d/t anemia with history of CLL. Also found to be anemic with HG 5.7. Received 2 units of PRBC's with IV lasix afterwards. Symptoms improved. [name] was stopped due to hypotension.   Echo 05/30/22: EF of 45-50% along with mild/moderate MR Echo 06/19/23: EF 40-45% along with mild LVH, Grade II DD, mildly elevated PA pressure, mild/ moderate MR with moderate AS with Aortic valve area, by VTI measures 1.00 cm. Aortic valve mean gradient measures 13.0 mmHg.   She presents today for a HF follow-up visit with a chief complaint of increased shortness of breath with minimal exertion. She has noticed a worsening of her SOB for the last several days. Took an extra 20mg  furosemide 2 days ago with improvement of her symptoms. Has fatigue and chest heaviness at times when lying down along with this. Denies chest pain, palpitations, cough, abdominal distention, pedal edema, dizziness or change in sleeping habits. No change in weight, fluid intake or sodium intake.   ROS: All systems negative except as listed in HPI, PMH and Problem List.  SH:  Social History   Socioeconomic History   Marital status: Widowed    Spouse name: Not on file   Number of children: Not on file   Years of education: Not on file   Highest education  level: Not on file  Occupational History   Not on file  Tobacco Use   Smoking status: Never   Smokeless tobacco: Never  Substance and Sexual Activity   Alcohol use: No   Drug use: Never   Sexual activity: Not Currently  Other Topics Concern   Not on file  Social History Narrative   Lives with daughter, in [place]. 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    Social Drivers of Corporate investment banker Strain: Not on file  Food Insecurity: No Food Insecurity (09/19/2023)   Hunger Vital Sign    Worried About Running Out of Food in the Last Year: Never true    Ran Out of Food in the Last Year: Never true  Transportation Needs: No Transportation Needs (09/19/2023)   PRAPARE - Administrator, Civil Service (Medical): No    Lack of Transportation (Non-Medical): No  Physical Activity: Not on file  Stress: Not on file  Social Connections: Not on file  Intimate Partner Violence: Not At Risk (09/19/2023)   Humiliation, Afraid, Rape, and Kick questionnaire    Fear of Current or Ex-Partner: No    Emotionally Abused: No    Physically Abused: No    Sexually Abused: No    FH:  Family History  Problem Relation Age of Onset   Cancer Mother        uterine cancer    Past Medical History:  Diagnosis Date   Arthritis    RHEUMATOID   CHF (congestive  heart failure) (HCC)    CLL (chronic lymphocytic leukemia) (HCC)    Edema    MILD ANKLE OCCAS   Fibromyalgia    GERD (gastroesophageal reflux disease)    Hypertension    Kidney stones    RLS (restless legs syndrome)     Current Outpatient Medications  Medication Sig Dispense Refill   amitriptyline (ELAVIL) 10 MG tablet Take 20 mg by mouth at bedtime.     cyanocobalamin (,VITAMIN B-12,) 1000 MCG/ML injection Inject into the muscle every 14 (fourteen) days.     cyanocobalamin (VITAMIN B12) 1000 MCG tablet Take 1,000 mcg by mouth daily.     DULoxetine (CYMBALTA) 20 MG capsule Take 20 mg by mouth daily.      feeding supplement, ENSURE ENLIVE, (ENSURE ENLIVE) LIQD Take 237 mLs by mouth 2 (two) times daily between meals. 237 mL 12   furosemide (LASIX) 20 MG tablet Take 2 tablets (40 mg total) by mouth daily. 60 tablet 5   HYDROcodone-acetaminophen (NORCO) 10-325 MG tablet Take 1 tablet by mouth every 6 (six) hours as needed.     No current facility-administered medications for this visit.   Vitals:   11/30/23 1338  BP: (!) 143/48  Pulse: 68  SpO2: 99%  Weight: 100 lb (45.4 kg)   Wt Readings from Last 3 Encounters:  11/30/23 100 lb (45.4 kg)  11/22/23 100 lb 12.8 oz (45.7 kg)  11/06/23 101 lb 1.6 oz (45.9 kg)   Lab Results  Component Value Date   CREATININE 1.33 (H) 11/22/2023   CREATININE 1.59 (H) 11/06/2023   CREATININE 1.47 (H) 10/25/2023    PHYSICAL EXAM:  General:  Well appearing. No resp difficulty HEENT: normal Neck: supple. JVP elevated. Enlarged lymph nodes bilaterally Cor: PMI normal. Regular rhythm & rate. No rubs, gallops or murmurs. Lungs: clear Abdomen: soft, nontender, nondistended. No hepatosplenomegaly. No bruits or masses.  Extremities: no cyanosis, clubbing, rash, edema Neuro: alert & oriented x3, cranial nerves grossly intact. Moves all 4 extremities w/o difficulty. Affect pleasant.  ECG: not done  ReDs 36%   ASSESSMENT & PLAN:  1: NICM with mildly reduced ejection fraction- - likely HTN/ CLL - NYHA class III - euvolemic - weighing daily; reminded to call for an overnight weight gain of > 2 pounds or a weekly weight gain of > 5 pounds - weight stable from last visit here 2 months ago - ReDs 36% - Echo 05/30/22: EF of 45-50% along with mild/moderate MR - Echo 06/19/23: EF 40-45% along with mild LVH, Grade II DD, mildly elevated PA pressure, mild/ moderate MR with moderate AS with Aortic valve area, by VTI measures 1.00 cm. Aortic valve mean gradient measures 13.0 mmHg.  - watching her sodium intake closely - continue furosemide 40mg  daily - consider  resuming low dose entresto if BP continues to stabilize - has had elevated K+ in the past so unable to use spironolactone - history of recurrent UTI so will not be a candidate for SGLT2 - BNP 09/19/23 was 1417.2 - BNP/ BMET today and if renal function looks good, can take an extra 20mg  furosemide PRN  2: HTN- - BP 143/48 - saw PCP Burnett Sheng) 11/24 - BMP 11/22/23 showed sodium 141, potassium 4.0, creatinine 1.33 and GFR 39 - BMET today  3: Anemia- - taking oral B12 daily and injection every 2 weeks - hemoglobin 11/22/23 was 8.8 - vitamin B12 level 04/30/23 was 1164 - CBC today  4: CLL- - saw oncology Freida Busman) 12/24 - WBC 11/22/23 was  48.4   Return in 1 month, sooner if needed.

## 2023-11-29 NOTE — Telephone Encounter (Signed)
Pt confirmed appt for 11/30/23

## 2023-11-30 ENCOUNTER — Ambulatory Visit (HOSPITAL_BASED_OUTPATIENT_CLINIC_OR_DEPARTMENT_OTHER): Payer: Medicare Other | Admitting: Family

## 2023-11-30 ENCOUNTER — Telehealth: Payer: Self-pay | Admitting: Family

## 2023-11-30 ENCOUNTER — Other Ambulatory Visit
Admission: RE | Admit: 2023-11-30 | Discharge: 2023-11-30 | Disposition: A | Discharge status: Home / Self Care | Payer: Medicare Other | Source: Ambulatory Visit | Attending: Family | Admitting: Family

## 2023-11-30 ENCOUNTER — Encounter: Payer: Self-pay | Admitting: Family

## 2023-11-30 VITALS — BP 143/48 | HR 68 | Wt 100.0 lb

## 2023-11-30 DIAGNOSIS — C9110 Chronic lymphocytic leukemia of B-cell type not having achieved remission: Secondary | ICD-10-CM

## 2023-11-30 DIAGNOSIS — D519 Vitamin B12 deficiency anemia, unspecified: Secondary | ICD-10-CM

## 2023-11-30 DIAGNOSIS — I5022 Chronic systolic (congestive) heart failure: Secondary | ICD-10-CM

## 2023-11-30 DIAGNOSIS — I10 Essential (primary) hypertension: Secondary | ICD-10-CM | POA: Diagnosis not present

## 2023-11-30 LAB — BASIC METABOLIC PANEL
Anion gap: 10 (ref 5–15)
BUN: 37 mg/dL — ABNORMAL HIGH (ref 8–23)
CO2: 23 mmol/L (ref 22–32)
Calcium: 8.7 mg/dL — ABNORMAL LOW (ref 8.9–10.3)
Chloride: 107 mmol/L (ref 98–111)
Creatinine, Ser: 1.67 mg/dL — ABNORMAL HIGH (ref 0.44–1.00)
GFR, Estimated: 29 mL/min — ABNORMAL LOW (ref >60)
Glucose, Bld: 121 mg/dL — ABNORMAL HIGH (ref 70–99)
Potassium: 4.1 mmol/L (ref 3.5–5.1)
Sodium: 140 mmol/L (ref 135–145)

## 2023-11-30 LAB — CBC
HCT: 28.4 % — ABNORMAL LOW (ref 36.0–46.0)
Hemoglobin: 8.8 g/dL — ABNORMAL LOW (ref 12.0–15.0)
MCH: 32.6 pg (ref 26.0–34.0)
MCHC: 31 g/dL (ref 30.0–36.0)
MCV: 105.2 fL — ABNORMAL HIGH (ref 80.0–100.0)
Platelets: 165 10*3/uL (ref 150–400)
RBC: 2.7 MIL/uL — ABNORMAL LOW (ref 3.87–5.11)
RDW: 17.7 % — ABNORMAL HIGH (ref 11.5–15.5)
WBC: 54.8 10*3/uL (ref 4.0–10.5)
nRBC: 0 % (ref 0.0–0.2)

## 2023-11-30 LAB — BRAIN NATRIURETIC PEPTIDE: B Natriuretic Peptide: 770.3 pg/mL — ABNORMAL HIGH (ref 0.0–100.0)

## 2023-11-30 NOTE — Telephone Encounter (Signed)
Called and spoke with daughter, Gloris Manchester, regarding lab results obtained earlier today. WBC chronically elevated at 54.8 and Hg is stable at 8.8. Potassium stable at 4.1, renal function slightly worse and BNP elevated at 770.3.  Advised Traci to have patient take 60mg  furosemide daily X 3 days and then resume 40mg  furosemide daily.   Should this not help and she feels like she needs to be reassessed, she is to call back. Traci verbalized understanding and could repeat the instructions back to me. She was comfortable with this plan.

## 2023-11-30 NOTE — Patient Instructions (Signed)
 Go over to the [facility]. Go pass the gift shop and have your blood work completed.  We will only call you if the results are abnormal or if the provider would like to make medication changes.

## 2023-12-01 ENCOUNTER — Other Ambulatory Visit: Payer: Self-pay

## 2023-12-10 ENCOUNTER — Encounter: Payer: Medicare Other | Admitting: Family

## 2023-12-17 IMAGING — CT CT ANGIO CHEST
2 of 6 series · 18 of 46 positions shown · IV contrast (APPLIED)
Comparison: Chest radiograph dated 05/15/2022. PET-CT dated
03/22/2021.

CLINICAL DATA: Syncope.  Known lymphadenopathy (probable CLL).

EXAM:
CT ANGIOGRAPHY CHEST WITH CONTRAST
TECHNIQUE: Multidetector CT imaging of the chest was performed using the
standard protocol during bolus administration of intravenous
contrast. Multiplanar CT image reconstructions and MIPs were
obtained to evaluate the vascular anatomy.

[Series 6: thins · axial · 0.56mm/px · z∈[-712,-485]mm · 15 of 357 slices shown]
[im 16/357  lung]
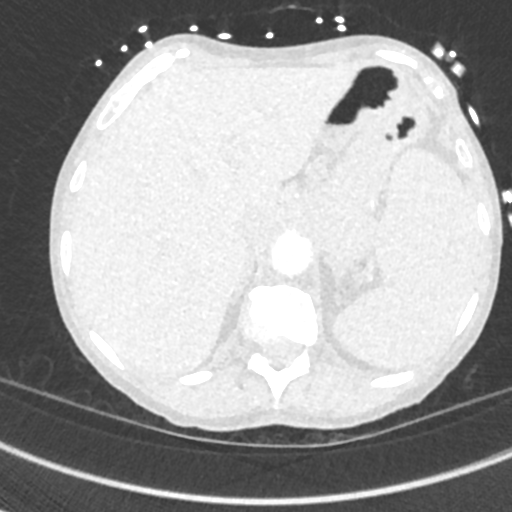
[im 47/357  soft-tissue]
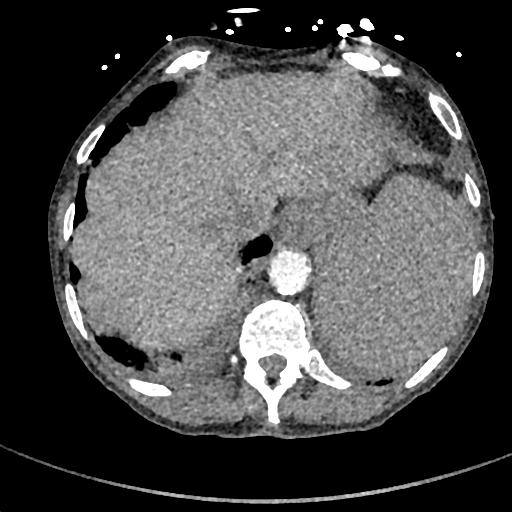
[im 62/357  lung]
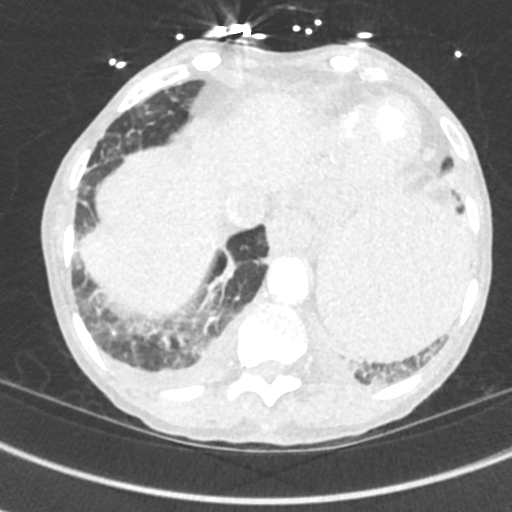
[im 93/357  soft-tissue]
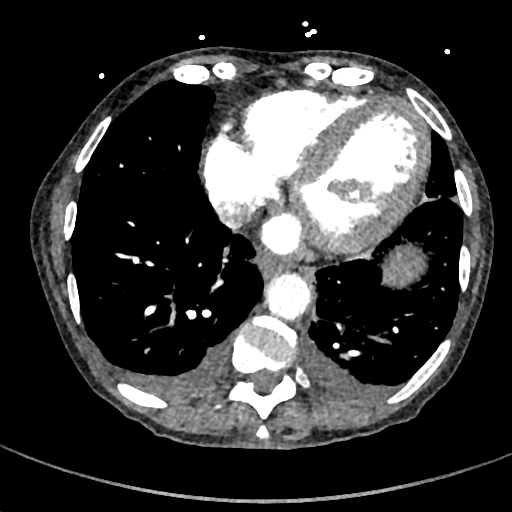
[im 109/357  lung]
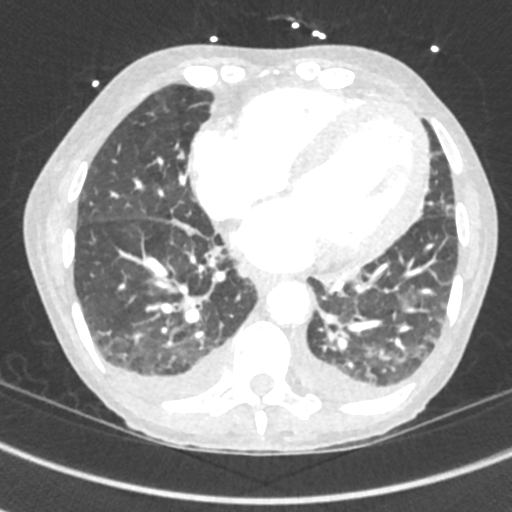
[im 140/357  soft-tissue]
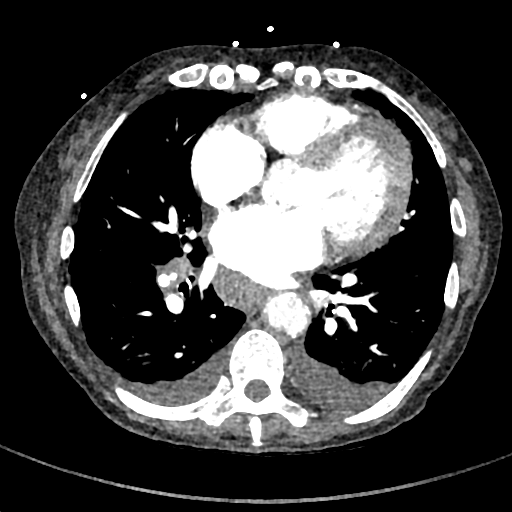
[im 155/357  lung]
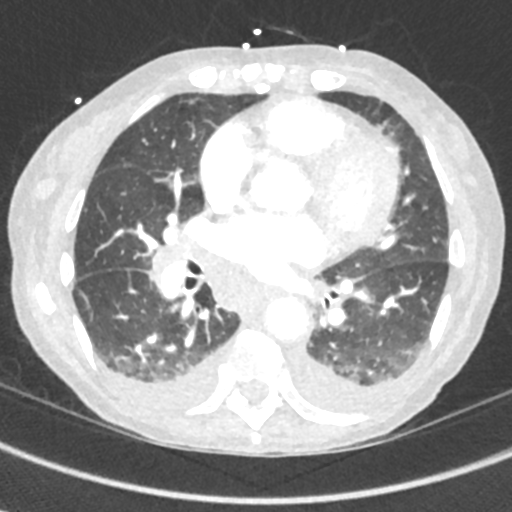
[im 186/357  soft-tissue]
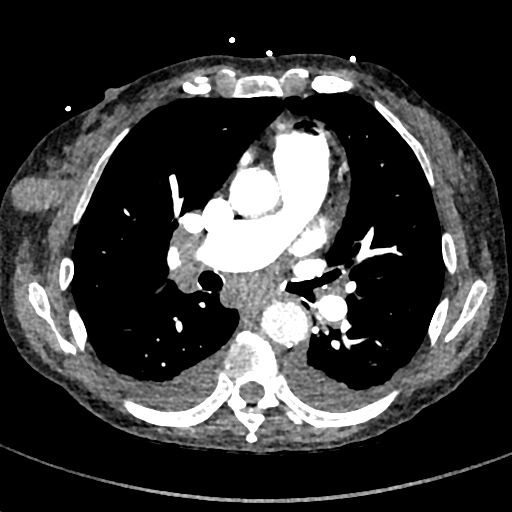
[im 202/357  lung]
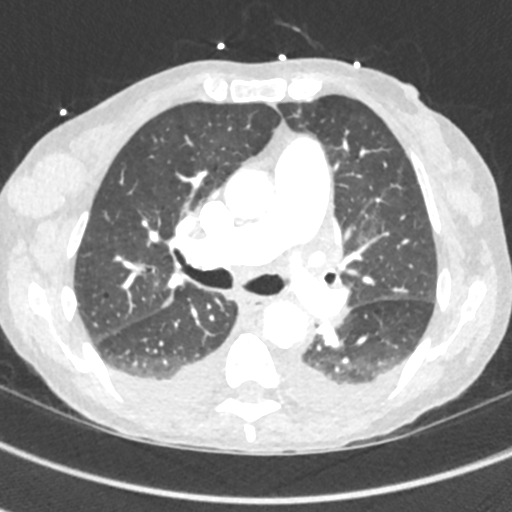
[im 217/357  soft-tissue]
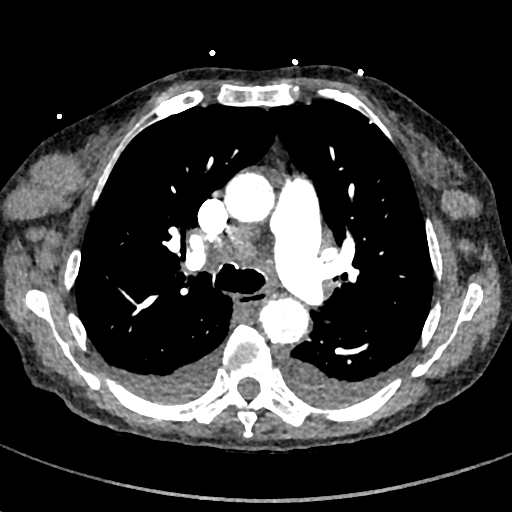
[im 248/357  lung]
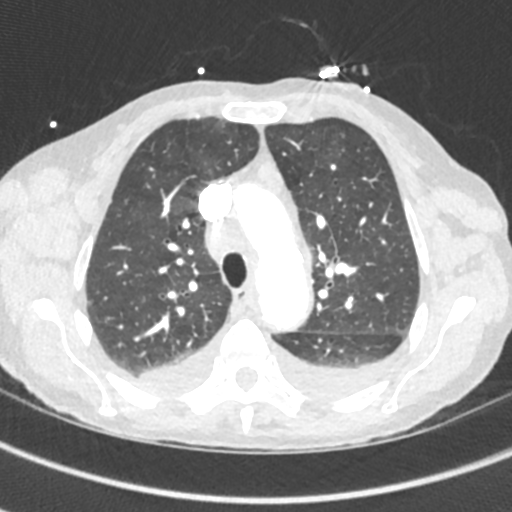
[im 264/357  soft-tissue]
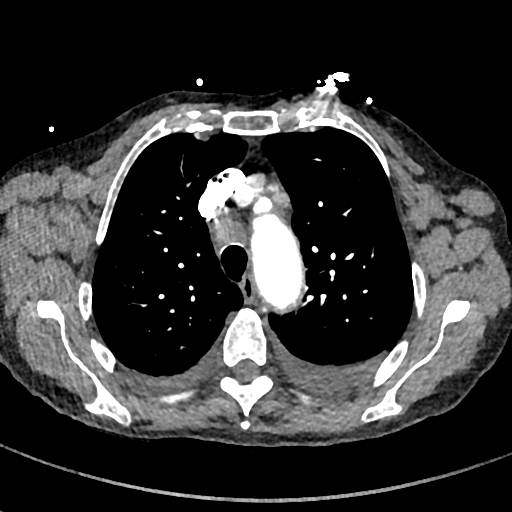
[im 295/357  lung]
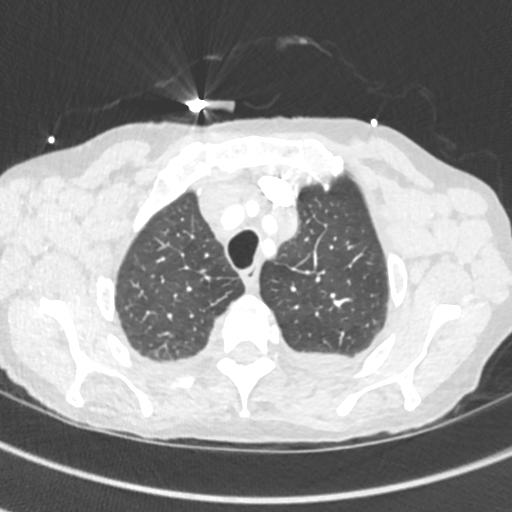
[im 310/357  soft-tissue]
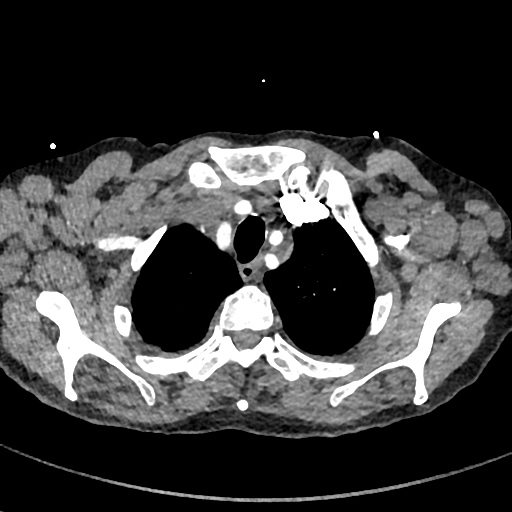
[im 341/357  lung]
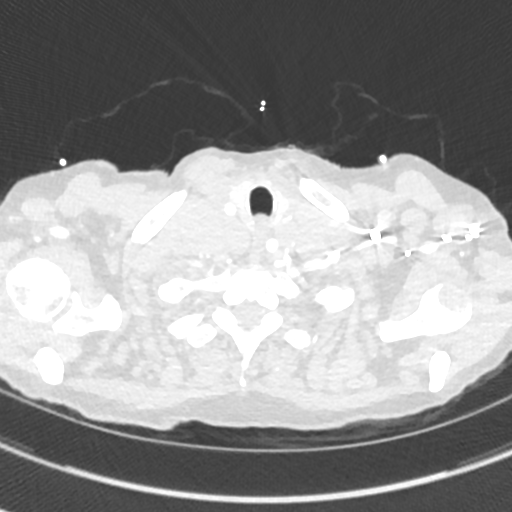

[Series 7: cor · coronal · 0.51mm/px · 3 of 122 slices shown]
[im 31/122  soft-tissue]
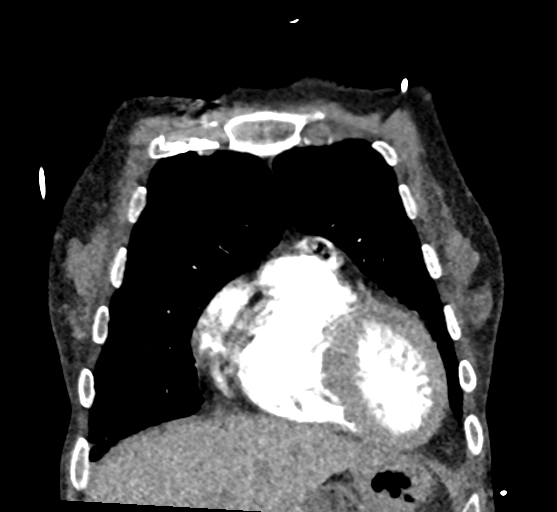
[im 61/122  soft-tissue]
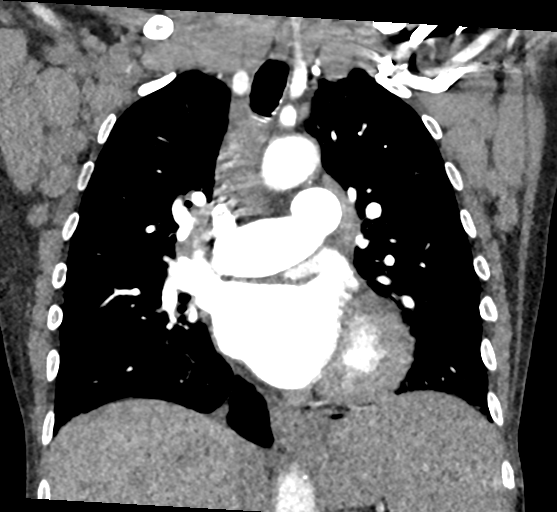
[im 91/122  soft-tissue]
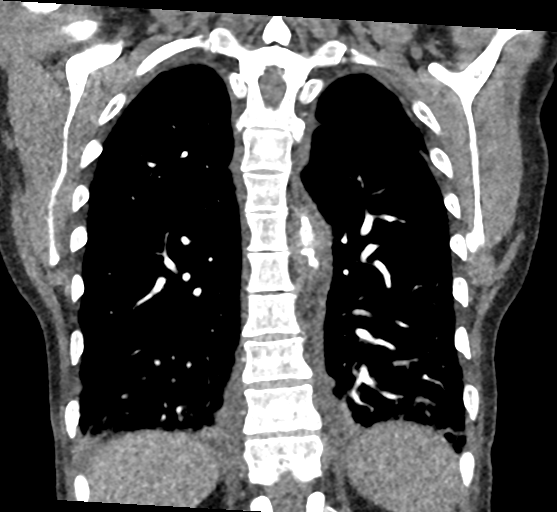

[18 of 46 positions shown; findings below may reference images not displayed]

RADIATION DOSE REDUCTION: This exam was performed according to the
departmental dose-optimization program which includes automated
exposure control, adjustment of the mA and/or kV according to
patient size and/or use of iterative reconstruction technique.

CONTRAST:  75mL OMNIPAQUE IOHEXOL 350 MG/ML SOLN
FINDINGS: Cardiovascular: Satisfactory opacification of the bilateral
pulmonary arteries to the segmental level. No evidence of pulmonary
embolism.

Although not tailored for evaluation of the thoracic aorta, there is
no evidence of thoracic aortic aneurysm or dissection.
Atherosclerotic calcifications of the aortic arch.

Heart is top-normal in size.  No pericardial effusion.

Mild coronary atherosclerosis of the LAD.

Mediastinum/Nodes: Extensive thoracic lymphadenopathy, similar to
the prior. Index nodes include bilateral axillary nodes measuring
2.0 cm short axis on the right (series 4/image 35) and 1.7 cm short
axis on the left (series 4/image 45). Bulky mediastinal nodes,
measuring 17 mm in the right lower paratracheal region (series
4/image 47) and 19 mm in the subcarinal region (series 4/image 62).
16 mm short axis right hilar node (series 4/image 60).

Visualized thyroid is unremarkable.

Lungs/Pleura: Small bilateral pleural effusions.

Scattered mild ground-glass opacity with faint interlobular septal
thickening in the upper lobes, suggesting mild interstitial edema.

No suspicious pulmonary nodules.

No pleural effusion or pneumothorax.

Upper Abdomen: Visualized upper abdomen is grossly unremarkable,
noting suspected splenomegaly, although incompletely visualized.

Musculoskeletal: Visualized osseous structures are within normal
limits.

Review of the MIP images confirms the above findings.
IMPRESSION: No evidence of pulmonary embolism.

Extensive thoracic lymphadenopathy, similar to the prior PET,
compatible with known CLL.

Small bilateral pleural effusions with suspected mild interstitial
edema.

Aortic Atherosclerosis ([id number].O).

## 2023-12-17 IMAGING — DX DG CHEST 1V PORT
1 series · 1 of 1 positions shown · non-contrast
Comparison: PA Lat 08/10/2021.

CLINICAL DATA: Shortness of breath and fevers.

EXAM:
PORTABLE CHEST 1 VIEW

[chest ap]
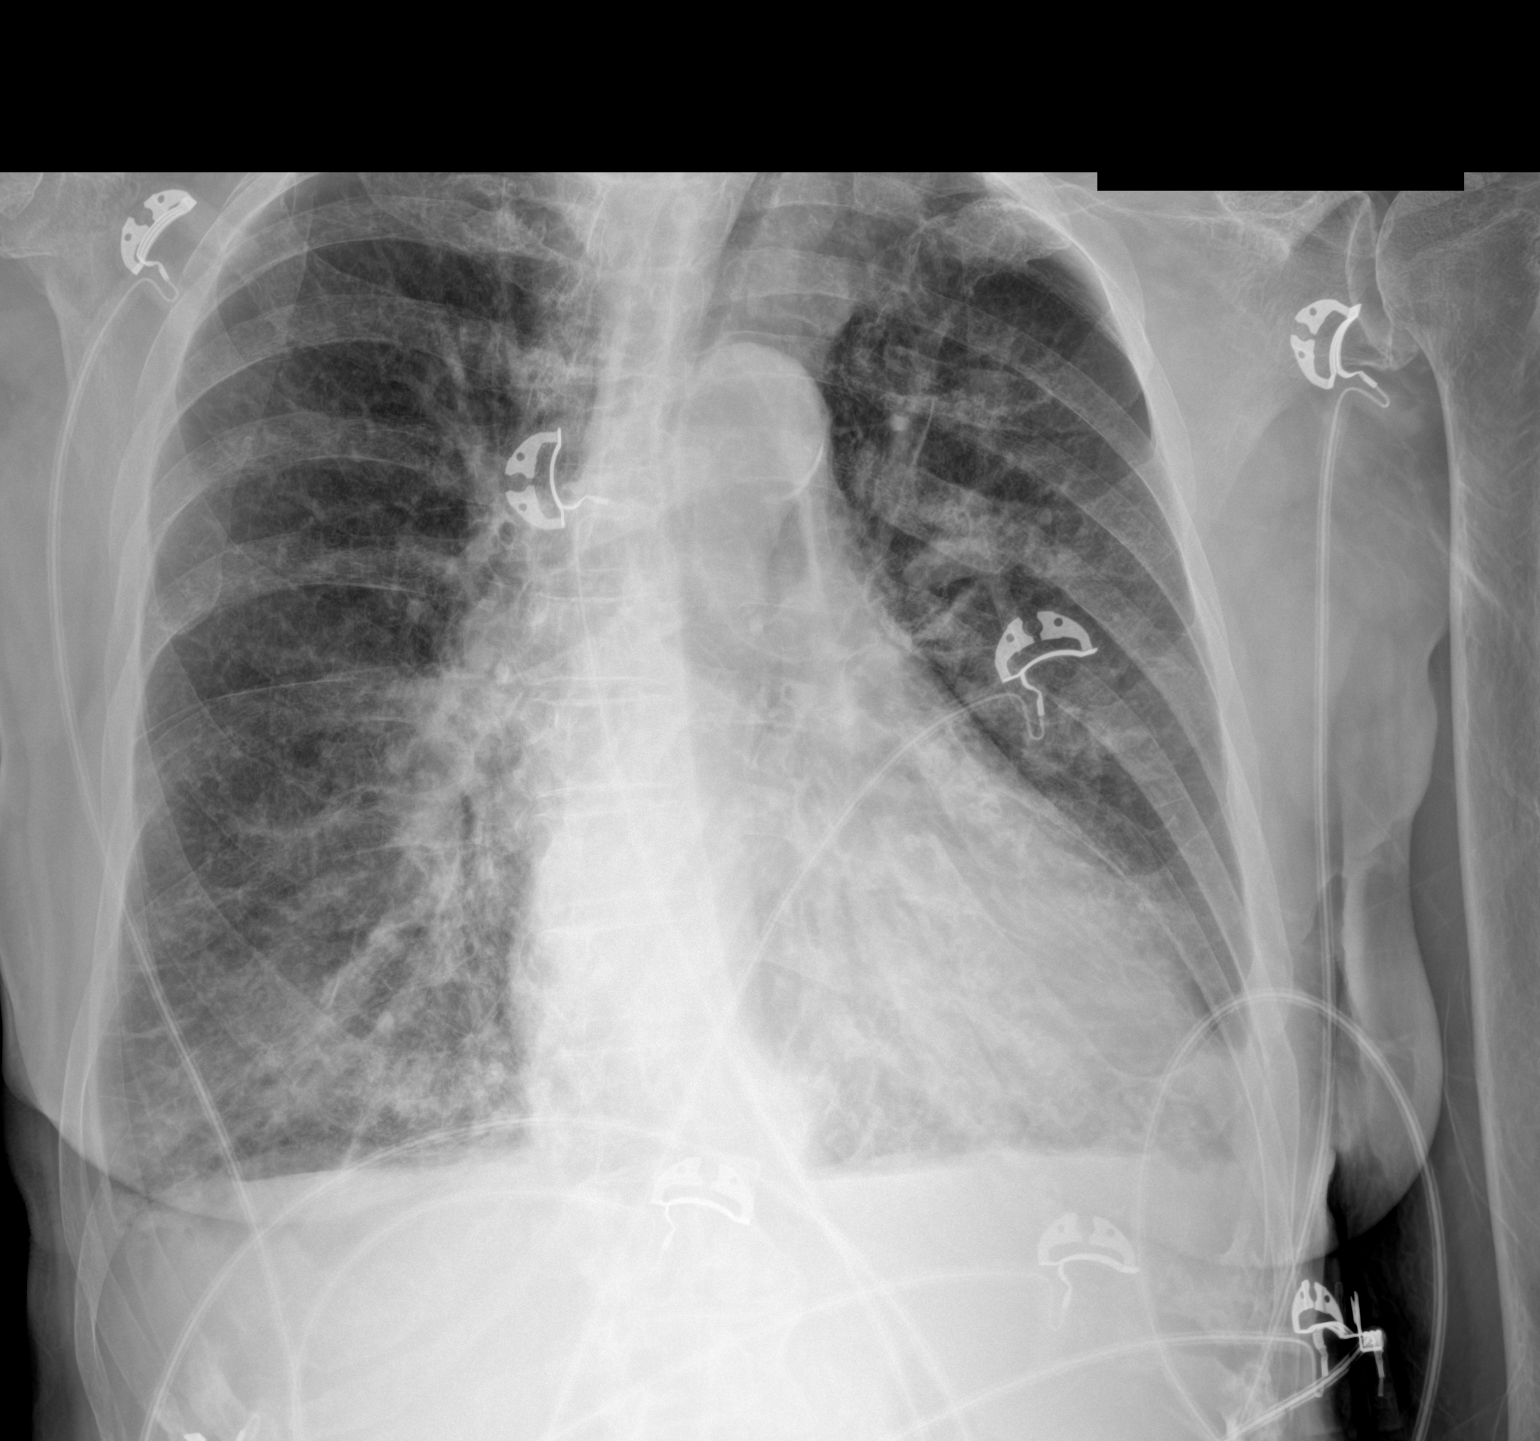

[1 of 1 positions shown; findings below may reference images not displayed]

FINDINGS: There is mild cardiomegaly, increased perihilar vascular congestion
and basilar interstitial edema, and small pleural effusions have
formed. There is hazy interstitial consolidation in the lower lung
fields which could be ground-glass edema, pneumonitis or
combination.

The mid and upper lung fields are clear with COPD change. The
mediastinum is stable with mild aortic atherosclerosis. Mild
thoracic dextroscoliosis and osteopenia.
IMPRESSION: Cardiomegaly with mild perihilar vascular congestion and basilar
interstitial edema.

Findings consistent with CHF or fluid overload, with small pleural
effusions.

Hazy opacities in the lower lung fields consistent with ground-glass
edema and/or pneumonitis.

Clinical correlation and radiographic follow-up recommended.

## 2023-12-20 ENCOUNTER — Inpatient Hospital Stay: Payer: Medicare Other | Attending: Nurse Practitioner

## 2023-12-20 ENCOUNTER — Inpatient Hospital Stay (HOSPITAL_BASED_OUTPATIENT_CLINIC_OR_DEPARTMENT_OTHER): Payer: Medicare Other | Admitting: Nurse Practitioner

## 2023-12-20 ENCOUNTER — Encounter: Payer: Self-pay | Admitting: Nurse Practitioner

## 2023-12-20 VITALS — BP 142/45 | HR 56 | Temp 97.0°F | Wt 97.5 lb

## 2023-12-20 DIAGNOSIS — I110 Hypertensive heart disease with heart failure: Secondary | ICD-10-CM | POA: Diagnosis not present

## 2023-12-20 DIAGNOSIS — D649 Anemia, unspecified: Secondary | ICD-10-CM | POA: Diagnosis not present

## 2023-12-20 DIAGNOSIS — C9110 Chronic lymphocytic leukemia of B-cell type not having achieved remission: Secondary | ICD-10-CM

## 2023-12-20 DIAGNOSIS — Z79899 Other long term (current) drug therapy: Secondary | ICD-10-CM | POA: Insufficient documentation

## 2023-12-20 DIAGNOSIS — E876 Hypokalemia: Secondary | ICD-10-CM | POA: Insufficient documentation

## 2023-12-20 DIAGNOSIS — I509 Heart failure, unspecified: Secondary | ICD-10-CM | POA: Diagnosis not present

## 2023-12-20 LAB — BASIC METABOLIC PANEL - CANCER CENTER ONLY
Anion gap: 9 (ref 5–15)
BUN: 61 mg/dL — ABNORMAL HIGH (ref 8–23)
CO2: 22 mmol/L (ref 22–32)
Calcium: 8.5 mg/dL — ABNORMAL LOW (ref 8.9–10.3)
Chloride: 106 mmol/L (ref 98–111)
Creatinine: 1.64 mg/dL — ABNORMAL HIGH (ref 0.44–1.00)
GFR, Estimated: 30 mL/min — ABNORMAL LOW (ref >60)
Glucose, Bld: 99 mg/dL (ref 70–99)
Potassium: 4.1 mmol/L (ref 3.5–5.1)
Sodium: 137 mmol/L (ref 135–145)

## 2023-12-20 LAB — CBC WITH DIFFERENTIAL (CANCER CENTER ONLY)
Abs Immature Granulocytes: 0.13 10*3/uL — ABNORMAL HIGH (ref 0.00–0.07)
Basophils Absolute: 0.1 10*3/uL (ref 0.0–0.1)
Basophils Relative: 0 %
Eosinophils Absolute: 1.5 10*3/uL — ABNORMAL HIGH (ref 0.0–0.5)
Eosinophils Relative: 2 %
HCT: 27.6 % — ABNORMAL LOW (ref 36.0–46.0)
Hemoglobin: 8.7 g/dL — ABNORMAL LOW (ref 12.0–15.0)
Immature Granulocytes: 0 %
Lymphocytes Relative: 90 %
Lymphs Abs: 60.6 10*3/uL — ABNORMAL HIGH (ref 0.7–4.0)
MCH: 33.3 pg (ref 26.0–34.0)
MCHC: 31.5 g/dL (ref 30.0–36.0)
MCV: 105.7 fL — ABNORMAL HIGH (ref 80.0–100.0)
Monocytes Absolute: 2.7 10*3/uL — ABNORMAL HIGH (ref 0.1–1.0)
Monocytes Relative: 4 %
Neutro Abs: 2.5 10*3/uL (ref 1.7–7.7)
Neutrophils Relative %: 4 %
Platelet Count: 140 10*3/uL — ABNORMAL LOW (ref 150–400)
RBC: 2.61 MIL/uL — ABNORMAL LOW (ref 3.87–5.11)
RDW: 14.6 % (ref 11.5–15.5)
Smear Review: NORMAL
WBC Count: 67.4 10*3/uL (ref 4.0–10.5)
nRBC: 0 % (ref 0.0–0.2)

## 2023-12-20 LAB — SAMPLE TO BLOOD BANK

## 2023-12-20 NOTE — Progress Notes (Signed)
 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 Low level hypermetabolism consistent with CLL.2. Splenomegaly but no focal splenic lesions.  # April 2022- 13 Q DEL; IGVH- UNMUTATED  # MID April 2022- IBRUTINIB  280 mg x3 days [STOPPED sec ER/hypertensive emergency- 191/129]  # MAY 26th, 2022- start acalabrutinib  100 mg once a day [reduced dose]; intermittent; discontinued  September mid 2022. [Muscle cramps.]  # MAY 1st week- 2024- Zanubrutinib  80 mg; stopped after 1-2 weeks- sec to acute renal failure hyperkalemia/extreme fatigue etc.  Patient declined further therapies.       CLL (chronic lymphocytic leukemia) (HCC)  02/14/2021 Initial Diagnosis   CLL (chronic lymphocytic leukemia) (HCC)   04/07/2021 Cancer Staging   Staging form: Chronic Lymphocytic Leukemia / Small Lymphocytic Lymphoma, AJCC 8th Edition - Clinical: Modified Rai Stage III (Modified Rai risk: High, Binet: Stage C) - Signed by Rennie Cindy SAUNDERS, MD on 04/07/2021 Stage prefix: Initial diagnosis   12/16/2021 -  Chemotherapy   Patient is on Treatment Plan : CLL - Rituximab q 4 weeks      HISTORY OF PRESENTING ILLNESS: Frail-appearing Caucasian female patient/accompanied by daughter.  Ambulating independently.  Lindsay [name] 89 y.o. female patient with symptomatic CLL currently on on surveillance, supportive care, because of intolerance  to ibrutinib /acalabrutinib /zanubrutinib  is here for follow-up and possible transfusion. She's feeling well and denies worsening symptoms. Her energy level has been better and she has been cleaning a lot. Weight is stable. Lymphadenopathy is stable. Denies fevers, chills, or bothersome night sweats.    Review of Systems  Constitutional:  Positive for malaise/fatigue. Negative for chills, diaphoresis, fever and weight loss.  HENT:  Negative for nosebleeds and sore throat.   Eyes:  Negative for double vision.  Respiratory:  Negative for cough, hemoptysis, sputum production, shortness of breath and wheezing.   Cardiovascular:  Negative for chest pain, palpitations, orthopnea and leg swelling.  Gastrointestinal:  Negative for abdominal pain, constipation, diarrhea, heartburn, melena, nausea and vomiting.  Genitourinary:  Negative for dysuria, frequency and urgency.  Musculoskeletal:  Positive for joint pain. Negative for back pain.  Skin: Negative.  Negative for itching and rash.  Neurological:  Negative for dizziness, tingling, focal weakness, weakness and headaches.  Endo/Heme/Allergies:  Bruises/bleeds easily.  Psychiatric/Behavioral:  Negative for depression. The patient is not nervous/anxious and does not have insomnia.      MEDICAL HISTORY:  Past Medical History:  Diagnosis Date   Arthritis    RHEUMATOID   CHF (congestive heart failure) (HCC)    CLL (chronic lymphocytic leukemia) (HCC)    Edema    MILD ANKLE OCCAS   Fibromyalgia    GERD (gastroesophageal reflux disease)    Hypertension    Kidney stones    RLS (restless legs syndrome)     SURGICAL HISTORY: Past Surgical History:  Procedure Laterality Date   ABDOMINAL HYSTERECTOMY     CATARACT EXTRACTION W/PHACO Left 02/07/2016   Procedure: CATARACT EXTRACTION PHACO AND INTRAOCULAR LENS PLACEMENT (IOC);  Surgeon: Steven Dingeldein, MD;  Location:  [facility] ORS;  Service: Ophthalmology;  Laterality: Left;  US  01:33 AP% 24.7 CDE  39.57 fluid pack lot # [id number]   EXTRACORPOREAL SHOCK WAVE LITHOTRIPSY Right 07/03/2019   Procedure: EXTRACORPOREAL SHOCK WAVE LITHOTRIPSY (ESWL);  Surgeon: Kassie Ozell SAUNDERS, MD;  Location: [facility] ORS;  Service: Urology;  Laterality: Right;   EYE SURGERY     HIP ARTHROPLASTY Left 04/20/2020   Procedure: ARTHROPLASTY BIPOLAR HIP (HEMIARTHROPLASTY);  Surgeon: Kathlynn Ozell, MD;  Location: [facility] ORS;  Service: Orthopedics;  Laterality: Left;   LITHOTRIPSY     TONSILLECTOMY      SOCIAL HISTORY: Social History   Socioeconomic History   Marital status: Widowed    Spouse name: Not on file   Number of children: Not on file   Years of education: Not on file   Highest education level: Not on file  Occupational History   Not on file  Tobacco Use   Smoking status: Never   Smokeless tobacco: Never  Substance and Sexual Activity   Alcohol use: No   Drug use: Never   Sexual activity: Not Currently  Other Topics Concern   Not on file  Social History Narrative   Lives with daughter, in [place]. Remote smoking; no alcohol. 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    Social Drivers of Corporate Investment Banker Strain: Not on file  Food Insecurity: No Food Insecurity (09/19/2023)   Hunger Vital Sign    Worried About Running Out of Food in the Last Year: Never true    Ran Out of Food in the Last Year: Never true  Transportation Needs: No Transportation Needs (09/19/2023)   PRAPARE - Administrator, Civil Service (Medical): No    Lack of Transportation (Non-Medical): No  Physical Activity: Not on file  Stress: Not on file  Social Connections: Not on file  Intimate Partner Violence: Not At Risk (09/19/2023)   Humiliation, Afraid, Rape, and Kick questionnaire    Fear of Current or Ex-Partner: No    Emotionally Abused: No    Physically Abused: No    Sexually Abused: No    FAMILY HISTORY: Family History  Problem Relation Age of Onset   Cancer Mother        uterine cancer    ALLERGIES:  is  allergic to gabapentin and demerol [meperidine].  MEDICATIONS:  Current Outpatient Medications  Medication Sig Dispense Refill   amitriptyline  (ELAVIL ) 10 MG tablet Take 20 mg by mouth at bedtime.     cyanocobalamin  (,VITAMIN B-12,) 1000 MCG/ML injection Inject into the muscle every 14 (fourteen) days.     cyanocobalamin  (VITAMIN B12) 1000 MCG tablet Take 1,000 mcg by mouth daily.     DULoxetine  (CYMBALTA ) 20 MG capsule Take 20 mg by mouth daily.     feeding supplement, ENSURE ENLIVE, (ENSURE ENLIVE) LIQD Take 237 mLs by mouth 2 (two) times daily between meals. 237 mL 12   furosemide  (LASIX ) 20 MG tablet Take 2 tablets (40 mg total) by mouth daily. 60 tablet 5   HYDROcodone -acetaminophen  (NORCO) 10-325 MG tablet Take 1 tablet by mouth every 6 (six) hours as needed.     No current facility-administered medications for this visit.    PHYSICAL EXAMINATION: ECOG PERFORMANCE STATUS: 2 - Symptomatic, <50% confined to bed Vitals:   12/20/23 1018  BP: (!) 142/45  Pulse: (!) 56  Temp: (!) 97 F (36.1 C)  SpO2: 100%   Filed Weights   12/20/23 1018  Weight: 97 lb 8 oz (44.2 kg)   Physical  Exam Vitals reviewed.  Constitutional:      Appearance: She is not ill-appearing.  HENT:     Head: Normocephalic and atraumatic.  Cardiovascular:     Rate and Rhythm: Normal rate and regular rhythm.  Pulmonary:     Effort: Pulmonary effort is normal.  Abdominal:     Palpations: Abdomen is soft.     Tenderness: There is no abdominal tenderness.  Musculoskeletal:     Comments: ambulating  Lymphadenopathy:     Comments: Bulky bilateral neck adenopathy; underarm adenopathy.  Skin:    General: Skin is warm.     Coloration: Skin is not pale.     Comments: Chronic multiple bruises noted.  Neurological:     Mental Status: She is alert and oriented to person, place, and time.  Psychiatric:        Mood and Affect: Mood and affect normal.        Behavior: Behavior normal.    LABORATORY DATA:  I  have reviewed the data as listed Lab Results  Component Value Date   WBC 67.4 (HH) 12/20/2023   HGB 8.7 (L) 12/20/2023   HCT 27.6 (L) 12/20/2023   MCV 105.7 (H) 12/20/2023   PLT 140 (L) 12/20/2023   Recent Labs    05/07/23 1300 06/18/23 0631 06/19/23 0415 09/25/23 0855 10/09/23 0954 11/22/23 1317 11/30/23 1425 12/20/23 0941  NA 139 141   < > 136   < > 141 140 137  K 3.9 3.9   < > 5.1   < > 4.0 4.1 4.1  CL 109 113*   < > 106   < > 110 107 106  CO2 21* 18*   < > 22   < > 25 23 22   GLUCOSE 115* 116*   < > 97   < > 136* 121* 99  BUN 36* 32*   < > 40*   < > 32* 37* 61*  CREATININE 1.27* 1.33*   < > 1.51*   < > 1.33* 1.67* 1.64*  CALCIUM  8.2* 8.4*   < > 8.7*   < > 8.6* 8.7* 8.5*  GFRNONAA 41* 39*   < > 33*   < > 39* 29* 30*  PROT 5.8* 5.6*  --  6.2*  --   --   --   --   ALBUMIN 3.7 3.8  --  4.0  --   --   --   --   AST 15 16  --  15  --   --   --   --   ALT 9 10  --  8  --   --   --   --   ALKPHOS 79 84  --  101  --   --   --   --   BILITOT 0.5 0.7  --  1.1  --   --   --   --    < > = values in this interval not displayed.   Iron/TIBC/Ferritin/ %Sat    Component Value Date/Time   IRON 27 (L) 09/20/2023 0250   TIBC 239 (L) 09/20/2023 0250   FERRITIN 40 09/20/2023 0250   IRONPCTSAT 11 09/20/2023 0250    RADIOGRAPHIC STUDIES: I have personally reviewed the radiological images as listed and agreed with the findings in the report. No results found.  ASSESSMENT & PLAN:   CLL (chronic lymphocytic leukemia)  # CLL: [56% OF NUCLEI POSITIVE FOR A 13Q DELETION]; April 2022-PET scan shows significant lymphadenopathy above and  below diaphragm; splenomegaly. Discontinued Ibrutinib  [hypertensive emergencies] acalabrutinib  [leg cramps even with minimum dose]. Patient tolerated zanubrutinib  poorly-with hypokalemia loss and renal failure/extreme fatigue etc.  She discontinued zanubrutinib . Discussed option of rituximab infusion weekly x 4- and other options. Declined d/t concern of her  frailty and potential side effects. No additional treatments plans. Opted for supportive care.    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  Hemoglobin today is 8.7  Hold off any transfusion.  Will plan PRBC supportive care. Clinically she receives approximately once a month for hemoglobin < 8.    # Arthritis- waxes and wanes. Can consider steroids if needed for palliation.   # Cardiac - history of CHF. Diastolic decreased. Clinically she is asymptomatic. Follow up with cardiology.    # Worsening renal function GFR 40s.  Underlying diuretic/CHF/possible tumor lysis- stable.    # Prognosis: She plans for supportive care including transfusions for now and then will transition to hospice when she is ready or over next few months. She has excitedly welcomed the birth of her new great grand-son. Plan to add palliative care visit at future visit to provide ACP documents.    DISPOSITION: No blood 4 weeks- lab, Dr Rennie D2 +/- blood- la   No problem-specific Assessment & Plan notes found for this encounter.  All questions were answered. The patient knows to call the clinic with any problems, questions or concerns.   Tinnie KANDICE Dawn, NP 12/20/2023

## 2023-12-21 ENCOUNTER — Other Ambulatory Visit: Payer: Self-pay

## 2023-12-21 ENCOUNTER — Inpatient Hospital Stay: Payer: Medicare Other

## 2023-12-27 NOTE — Progress Notes (Signed)
 PCP: Valora Agent, MD (last seen 01/25) Primary Cardiologist: None  Chief Complaint: fatigue  HPI:  Lindsay [name] is a 89 y/o female with a history of HTN, CKD, CLL (not taking chemo), anemia, LBBB, IBS, fibromyalgia, GERD, RLS and heart failure. She was unable to tolerate treatment of CLL with zanubrutinib  due to hyperkalemia, renal failure and extreme fatigue and has elected to stop any other treatment for this.  Admitted 06/18/23 due to shortness of breath due to HF exacerbation. Chest x-ray showed cardiomegaly and interstitial edema. BNP 1254, troponin 80--> 116. IV diuresed. Echo showed slight drop in EF to 40-45% from previous 45-50%. Admitted 09/19/23 due to shortness of breath. Had SIRS without infection likely d/t anemia with history of CLL. Also found to be anemic with HG 5.7. Received 2 units of PRBC's with IV lasix  afterwards. Symptoms improved. Entresto  was stopped due to hypotension.   Echo 05/30/22: EF of 45-50% along with mild/moderate MR Echo 06/19/23: EF 40-45% along with mild LVH, Grade II DD, mildly elevated PA pressure, mild/ moderate MR with moderate AS with Aortic valve area, by VTI measures 1.00 cm. Aortic valve mean gradient measures 13.0 mmHg.   She presents today for a HF follow-up visit with a chief complaint of minimal fatigue with exertion. Chronic in nature although is much better than it was. Has no other symptoms and specifically denies shortness of breath, chest pain, cough, palpitations, abdominal distention, pedal edema, difficulty sleeping or weight gain. Is no longer drinking ensure as she's eating better. Daughter has been trying to make sure she eats foods that can help keep her Hg and B12 levels elevated. Overall, she says that she feels much better.   ROS: All systems negative except as listed in HPI, PMH and Problem List.  SH:  Social History   Socioeconomic History   Marital status: Widowed    Spouse name: Not on file   Number of children: Not on file    Years of education: Not on file   Highest education level: Not on file  Occupational History   Not on file  Tobacco Use   Smoking status: Never   Smokeless tobacco: Never  Substance and Sexual Activity   Alcohol use: No   Drug use: Never   Sexual activity: Not Currently  Other Topics Concern   Not on file  Social History Narrative   Lives with daughter, in [place]. Remote smoking; no alcohol. 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    Social Drivers of Corporate Investment Banker Strain: Low Risk  (12/24/2023)   Received [facility]   Overall Financial Resource Strain (CARDIA)    Difficulty of Paying Living Expenses: Not hard at all  Food Insecurity: No Food Insecurity (12/24/2023)   Received from [facility]   Hunger Vital Sign    Worried About Running Out of Food in the Last Year: Never true    Ran Out of Food in the Last Year: Never true  Transportation Needs: No Transportation Needs (12/24/2023)   Received from [facility] - Transportation    In the past 12 months, has lack of transportation kept you from medical appointments or from getting medications?: No    Lack of Transportation (Non-Medical): No  Physical Activity: Not on file  Stress: Not on file  Social Connections: Not on file  Intimate Partner Violence: Not At Risk (09/19/2023)   Humiliation, Afraid, Rape, and Kick questionnaire    Fear of Current or  Ex-Partner: No    Emotionally Abused: No    Physically Abused: No    Sexually Abused: No    FH:  Family History  Problem Relation Age of Onset   Cancer Mother        uterine cancer    Past Medical History:  Diagnosis Date   Arthritis    RHEUMATOID   CHF (congestive heart failure) (HCC)    CLL (chronic lymphocytic leukemia) (HCC)    Edema    MILD ANKLE OCCAS   Fibromyalgia    GERD (gastroesophageal reflux disease)    Hypertension    Kidney stones    RLS (restless legs syndrome)     Current  Outpatient Medications  Medication Sig Dispense Refill   amitriptyline  (ELAVIL ) 10 MG tablet Take 20 mg by mouth at bedtime.     cyanocobalamin  (,VITAMIN B-12,) 1000 MCG/ML injection Inject into the muscle every 14 (fourteen) days.     cyanocobalamin  (VITAMIN B12) 1000 MCG tablet Take 1,000 mcg by mouth daily.     DULoxetine  (CYMBALTA ) 20 MG capsule Take 20 mg by mouth daily.     feeding supplement, ENSURE ENLIVE, (ENSURE ENLIVE) LIQD Take 237 mLs by mouth 2 (two) times daily between meals. 237 mL 12   furosemide  (LASIX ) 20 MG tablet Take 2 tablets (40 mg total) by mouth daily. 60 tablet 5   HYDROcodone -acetaminophen  (NORCO) 10-325 MG tablet Take 1 tablet by mouth every 6 (six) hours as needed.     No current facility-administered medications for this visit.   Vitals:   12/31/23 1339  BP: (!) 133/39  Pulse: 61  SpO2: 100%  Weight: 96 lb (43.5 kg)   Wt Readings from Last 3 Encounters:  12/31/23 96 lb (43.5 kg)  12/20/23 97 lb 8 oz (44.2 kg)  11/30/23 100 lb (45.4 kg)   Lab Results  Component Value Date   CREATININE 1.64 (H) 12/20/2023   CREATININE 1.67 (H) 11/30/2023   CREATININE 1.33 (H) 11/22/2023   PHYSICAL EXAM:  General:  Well appearing. No resp difficulty HEENT: normal Neck: supple. JVP elevated. Enlarged lymph nodes bilaterally Cor: PMI normal. Regular rhythm & rate. No rubs, gallops or murmurs. Lungs: clear Abdomen: soft, nontender, nondistended. No hepatosplenomegaly. No bruits or masses.  Extremities: no cyanosis, clubbing, rash, edema Neuro: alert & oriented x3, cranial nerves grossly intact. Moves all 4 extremities w/o difficulty. Affect pleasant.  ECG: not done   ASSESSMENT & PLAN:  1: NICM with mildly reduced ejection fraction- - likely due to HTN/ CLL - NYHA class II - euvolemic - weighing daily; reminded to call for an overnight weight gain of > 2 pounds or a weekly weight gain of > 5 pounds - weight down 4 pounds from last visit here 1 month ago -  Echo 05/30/22: EF of 45-50% along with mild/moderate MR - Echo 06/19/23: EF 40-45% along with mild LVH, Grade II DD, mildly elevated PA pressure, mild/ moderate MR with moderate AS with Aortic valve area, by VTI measures 1.00 cm. Aortic valve mean gradient measures 13.0 mmHg.  - watching her sodium intake closely - continue furosemide  40mg  daily - if BP allows in the future, can resume entresto  or try low dose losartan - has had elevated K+ in the past so unable to use spironolactone - history of recurrent UTI so will not be a candidate for SGLT2 - BNP 09/19/23 was 1417.2  2: HTN- - BP 133/39 - saw PCP Lindsay [name]) 01/25 - BMP 12/20/23 showed sodium 137, potassium 4.1,  creatinine 1.64 and GFR 30  3: Anemia- - taking oral B12 daily and injection every 2 weeks - hemoglobin 12/20/23 was 8.7 - vitamin B12 level 04/30/23 was 1164  4: CLL- - saw oncology Lindsay [name]) 01/25 - WBC 12/20/23 was 67.4   Return in 3 months, sooner if needed.

## 2023-12-31 ENCOUNTER — Encounter: Payer: Self-pay | Admitting: Family

## 2023-12-31 ENCOUNTER — Ambulatory Visit: Payer: Medicare Other | Attending: Family | Admitting: Family

## 2023-12-31 VITALS — BP 133/39 | HR 61 | Wt 96.0 lb

## 2023-12-31 DIAGNOSIS — I5022 Chronic systolic (congestive) heart failure: Secondary | ICD-10-CM

## 2023-12-31 DIAGNOSIS — I447 Left bundle-branch block, unspecified: Secondary | ICD-10-CM | POA: Insufficient documentation

## 2023-12-31 DIAGNOSIS — R5383 Other fatigue: Secondary | ICD-10-CM | POA: Diagnosis present

## 2023-12-31 DIAGNOSIS — D519 Vitamin B12 deficiency anemia, unspecified: Secondary | ICD-10-CM | POA: Diagnosis not present

## 2023-12-31 DIAGNOSIS — I509 Heart failure, unspecified: Secondary | ICD-10-CM | POA: Diagnosis not present

## 2023-12-31 DIAGNOSIS — Z79899 Other long term (current) drug therapy: Secondary | ICD-10-CM | POA: Diagnosis not present

## 2023-12-31 DIAGNOSIS — G2581 Restless legs syndrome: Secondary | ICD-10-CM | POA: Diagnosis not present

## 2023-12-31 DIAGNOSIS — N189 Chronic kidney disease, unspecified: Secondary | ICD-10-CM | POA: Insufficient documentation

## 2023-12-31 DIAGNOSIS — F17200 Nicotine dependence, unspecified, uncomplicated: Secondary | ICD-10-CM | POA: Insufficient documentation

## 2023-12-31 DIAGNOSIS — I130 Hypertensive heart and chronic kidney disease with heart failure and stage 1 through stage 4 chronic kidney disease, or unspecified chronic kidney disease: Secondary | ICD-10-CM | POA: Insufficient documentation

## 2023-12-31 DIAGNOSIS — D649 Anemia, unspecified: Secondary | ICD-10-CM | POA: Insufficient documentation

## 2023-12-31 DIAGNOSIS — I428 Other cardiomyopathies: Secondary | ICD-10-CM | POA: Insufficient documentation

## 2023-12-31 DIAGNOSIS — C9110 Chronic lymphocytic leukemia of B-cell type not having achieved remission: Secondary | ICD-10-CM | POA: Diagnosis not present

## 2023-12-31 DIAGNOSIS — I10 Essential (primary) hypertension: Secondary | ICD-10-CM

## 2023-12-31 DIAGNOSIS — M797 Fibromyalgia: Secondary | ICD-10-CM | POA: Insufficient documentation

## 2023-12-31 NOTE — Patient Instructions (Signed)
 It was great seeing you today!

## 2024-01-01 ENCOUNTER — Other Ambulatory Visit: Payer: Self-pay

## 2024-01-15 ENCOUNTER — Encounter: Payer: Self-pay | Admitting: Internal Medicine

## 2024-01-16 ENCOUNTER — Other Ambulatory Visit: Payer: Self-pay | Admitting: *Deleted

## 2024-01-16 DIAGNOSIS — I509 Heart failure, unspecified: Secondary | ICD-10-CM

## 2024-01-16 DIAGNOSIS — C9110 Chronic lymphocytic leukemia of B-cell type not having achieved remission: Secondary | ICD-10-CM

## 2024-01-17 ENCOUNTER — Inpatient Hospital Stay: Payer: Medicare Other

## 2024-01-17 ENCOUNTER — Encounter: Payer: Self-pay | Admitting: Internal Medicine

## 2024-01-17 ENCOUNTER — Inpatient Hospital Stay: Payer: Medicare Other | Admitting: Internal Medicine

## 2024-01-17 VITALS — BP 159/48 | HR 66 | Temp 98.2°F | Ht 64.0 in | Wt 96.0 lb

## 2024-01-17 DIAGNOSIS — C9110 Chronic lymphocytic leukemia of B-cell type not having achieved remission: Secondary | ICD-10-CM

## 2024-01-17 DIAGNOSIS — I509 Heart failure, unspecified: Secondary | ICD-10-CM

## 2024-01-17 LAB — CBC WITH DIFFERENTIAL (CANCER CENTER ONLY)
Abs Immature Granulocytes: 0.22 10*3/uL — ABNORMAL HIGH (ref 0.00–0.07)
Basophils Absolute: 0.1 10*3/uL (ref 0.0–0.1)
Basophils Relative: 0 %
Eosinophils Absolute: 1.3 10*3/uL — ABNORMAL HIGH (ref 0.0–0.5)
Eosinophils Relative: 2 %
HCT: 28.2 % — ABNORMAL LOW (ref 36.0–46.0)
Hemoglobin: 8.8 g/dL — ABNORMAL LOW (ref 12.0–15.0)
Immature Granulocytes: 0 %
Lymphocytes Relative: 92 %
Lymphs Abs: 77.5 10*3/uL — ABNORMAL HIGH (ref 0.7–4.0)
MCH: 32.7 pg (ref 26.0–34.0)
MCHC: 31.2 g/dL (ref 30.0–36.0)
MCV: 104.8 fL — ABNORMAL HIGH (ref 80.0–100.0)
Monocytes Absolute: 2.6 10*3/uL — ABNORMAL HIGH (ref 0.1–1.0)
Monocytes Relative: 3 %
Neutro Abs: 3 10*3/uL (ref 1.7–7.7)
Neutrophils Relative %: 3 %
Platelet Count: 135 10*3/uL — ABNORMAL LOW (ref 150–400)
RBC: 2.69 MIL/uL — ABNORMAL LOW (ref 3.87–5.11)
RDW: 13.8 % (ref 11.5–15.5)
Smear Review: NORMAL
WBC Count: 84.6 10*3/uL (ref 4.0–10.5)
nRBC: 0 % (ref 0.0–0.2)

## 2024-01-17 LAB — BASIC METABOLIC PANEL - CANCER CENTER ONLY
Anion gap: 9 (ref 5–15)
BUN: 62 mg/dL — ABNORMAL HIGH (ref 8–23)
CO2: 23 mmol/L (ref 22–32)
Calcium: 8.6 mg/dL — ABNORMAL LOW (ref 8.9–10.3)
Chloride: 105 mmol/L (ref 98–111)
Creatinine: 1.59 mg/dL — ABNORMAL HIGH (ref 0.44–1.00)
GFR, Estimated: 31 mL/min — ABNORMAL LOW (ref >60)
Glucose, Bld: 154 mg/dL — ABNORMAL HIGH (ref 70–99)
Potassium: 4.3 mmol/L (ref 3.5–5.1)
Sodium: 137 mmol/L (ref 135–145)

## 2024-01-17 LAB — BRAIN NATRIURETIC PEPTIDE: B Natriuretic Peptide: 294.6 pg/mL — ABNORMAL HIGH (ref 0.0–100.0)

## 2024-01-17 LAB — SAMPLE TO BLOOD BANK

## 2024-01-17 NOTE — Patient Instructions (Signed)
Recommend gentle iron [iron biglycinate; 28 mg ] 1 pill a day.  This pill is unlikely to cause stomach upset or cause constipation.

## 2024-01-17 NOTE — Progress Notes (Signed)
No concerns today

## 2024-01-17 NOTE — Assessment & Plan Note (Addendum)
#   CLL: [56% OF NUCLEI POSITIVE FOR A 13Q DELETION]; April 2022-PET scan shows significant lymphadenopathy above and below diaphragm; splenomegaly. Discontinued Ibrutinib [hypertensive emergencies] acalabrutinib [leg cramps even with minimum dose].Discontinued zanubrutinib. hypokalemia loss and renal failure/extreme fatigue etc.    # Progression of disease given the rising white count. Again declined Rituximab infusion weekly x 4- and other options. ? Chlorambucil-discuss.   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  #OCT 2024- I sat- 11%- Recommend gentle iron [iron biglycinate; 28 mg ] 1 pill a day.  This pill is unlikely to cause stomach upset or cause constipation.  Discussed regarding use of erythropoietin agents. Discussed use of erythropoietin stimulating agents like retacrit to stimulate the bone marrow.  Discussed the potential issues with erythropoietin estimating agents-given the risk of stroke thromboembolic events/elevated blood pressure.  However, most of the serious events did not happen when the goal hematocrit is 33/hemoglobin 30. Pt in agreement.   # Cardiac -history of CHF / elevated blood pressure-overall stable continue monitoring at home- stable   # Worsening renal function GFR 40s.  Underlying diuretic/CHF/possible tumor lysis- stable   # DISPOSITION: # No blood tomorrow # follow up in 4 weeks- MD; labs- cbc/ bmp;iron studies;ferritin; LDH; BNP; erythropoietin;possible Retacrit- HOLD tube; D-2- possible 1 unit PRBC- Dr.B

## 2024-01-17 NOTE — Progress Notes (Signed)
Critical lab called by Enzo Bi in lab; WBC 84.6 Readback; MD notified

## 2024-01-17 NOTE — Progress Notes (Signed)
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 Low level hypermetabolism consistent with CLL.2. Splenomegaly but no focal splenic lesions.  # April 2022- 13 Q DEL; IGVH- UNMUTATED  # MID April 2022- IBRUTINIB 280 mg x3 days [STOPPED sec ER/hypertensive emergency- 191/129]  # MAY 26th, 2022- start acalabrutinib 100 mg once a day [reduced dose]; intermittent; discontinued  September mid 2022. [Muscle cramps.]  # MAY 1st week- 2024- Zanubrutinib 80 mg; stopped after 1-2 weeks- sec to acute renal failure hyperkalemia/extreme fatigue etc.  Patient declined further therapies.       CLL (chronic lymphocytic leukemia) (HCC)  02/14/2021 Initial Diagnosis   CLL (chronic lymphocytic leukemia) (HCC)   04/07/2021 Cancer Staging   Staging form: Chronic Lymphocytic Leukemia / Small Lymphocytic Lymphoma, AJCC 8th Edition - Clinical: Modified Rai Stage III (Modified Rai risk: High, Binet: Stage C) - Signed by Earna Coder, MD on 04/07/2021 Stage prefix: Initial diagnosis   12/16/2021 -  Chemotherapy   Patient is on Treatment Plan : CLL - Rituximab q 4 weeks      HISTORY OF PRESENTING ILLNESS: Frail-appearing Caucasian female patient/accompanied by daughter.  Ambulating independently.  Lindsay [name] 89 y.o.  female symptomatic CLL currently on on surveillance because of intolerance to  ibrutinib/acalabrutinib/zanubrutinib is here for follow-up-currently on best supportive care with blood transfusions.  Patient has not had any hospitalizations over the last many months.  Patient s/p blood transfusion.   Complains of severe arthritis.   Also noticed significant increasing size of the neck lymph nodes/underarm lymph nodes.  Review of Systems  Constitutional:  Positive for malaise/fatigue and weight loss. Negative for chills, diaphoresis and fever.  HENT:  Negative for nosebleeds and sore throat.   Eyes:  Negative for double vision.  Respiratory:  Negative for cough, hemoptysis, sputum production, shortness of breath and wheezing.   Cardiovascular:  Negative for chest pain, palpitations, orthopnea and leg swelling.  Gastrointestinal:  Negative for abdominal pain, constipation, diarrhea, heartburn, melena, nausea and vomiting.  Genitourinary:  Negative for dysuria, frequency and urgency.  Musculoskeletal:  Positive for joint pain. Negative for back pain.  Skin: Negative.  Negative for itching and rash.  Neurological:  Negative for dizziness, tingling, focal weakness, weakness and headaches.  Endo/Heme/Allergies:  Bruises/bleeds easily.  Psychiatric/Behavioral:  Negative for depression. The patient is not nervous/anxious and does not have insomnia.      MEDICAL HISTORY:  Past Medical History:  Diagnosis Date   Arthritis    RHEUMATOID   CHF (congestive heart failure) (HCC)    CLL (chronic lymphocytic leukemia) (HCC)    Edema    MILD ANKLE OCCAS   Fibromyalgia    GERD (gastroesophageal reflux disease)    Hypertension    Kidney stones    RLS (restless legs syndrome)     SURGICAL HISTORY: Past Surgical History:  Procedure Laterality Date   ABDOMINAL HYSTERECTOMY     CATARACT EXTRACTION W/PHACO Left 02/07/2016   Procedure: CATARACT EXTRACTION PHACO AND INTRAOCULAR LENS PLACEMENT (IOC);  Surgeon: Viviann Spare  Dingeldein, MD;  Location: [facility] ORS;  Service: Ophthalmology;   Laterality: Left;  [place] 01:33 AP% 24.7 CDE 39.57 fluid pack lot # [id number]   EXTRACORPOREAL SHOCK WAVE LITHOTRIPSY Right 07/03/2019   Procedure: EXTRACORPOREAL SHOCK WAVE LITHOTRIPSY (ESWL);  Surgeon: Orson Ape, MD;  Location: [facility] OR[facility];  Service: Urology;  Laterality: Right;   EYE SURGERY     HIP ARTHROPLASTY Left 04/20/2020   Procedure: ARTHROPLASTY BIPOLAR HIP (HEMIARTHROPLASTY);  Surgeon: Kennedy Bucker, MD;  Location: [facility] ORS;  Service: Orthopedics;  Laterality: Left;   LITHOTRIPSY     TONSILLECTOMY      SOCIAL HISTORY: Social History   Socioeconomic History   Marital status: Widowed    Spouse name: Not on file   Number of children: Not on file   Years of education: Not on file   Highest education level: Not on file  Occupational History   Not on file  Tobacco Use   Smoking status: Never   Smokeless tobacco: Never  Substance and Sexual Activity   Alcohol use: No   Drug use: Never   Sexual activity: Not Currently  Other Topics Concern   Not on file  Social History Narrative   Lives with daughter, in [place]. 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    Social Drivers of Corporate investment banker Strain: Low Risk  (12/24/2023)   Received from [facility]   Overall Financial Resource Strain (CARDIA)    Difficulty of Paying Living Expenses: Not hard at all  Food Insecurity: No Food Insecurity (12/24/2023)   Received from [facility]   Hunger Vital Sign    Ran Out of Food in the Last Year: Never true    Worried About Running Out of Food in the Last Year: Never true  Transportation Needs: No Transportation Needs (12/24/2023)   Received from [facility]   PRAPARE - Transportation    Lack of Transportation (Non-Medical): No    In the past 12 months, has lack of transportation kept you from medical appointments or from getting medications?: No  Physical Activity: Not on file  Stress: Not on file  Social  Connections: Not on file  Intimate Partner Violence: Not At Risk (09/19/2023)   Humiliation, Afraid, Rape, and Kick questionnaire    Fear of Current or Ex-Partner: No    Emotionally Abused: No    Physically Abused: No    Sexually Abused: No    FAMILY HISTORY: Family History  Problem Relation Age of Onset   Cancer Mother        uterine cancer    ALLERGIES:  is allergic to gabapentin and demerol [meperidine].  MEDICATIONS:  Current Outpatient Medications  Medication Sig Dispense Refill   amitriptyline (ELAVIL) 10 MG tablet Take 20 mg by mouth at bedtime.     cyanocobalamin (,VITAMIN B-12,) 1000 MCG/ML injection Inject into the muscle every 14 (fourteen) days.     cyanocobalamin (VITAMIN B12) 1000 MCG tablet Take 1,000 mcg by mouth daily.     DULoxetine (CYMBALTA) 20 MG capsule Take 20 mg by mouth daily.     furosemide (LASIX) 20 MG tablet Take 2 tablets (40 mg total) by mouth daily. 60 tablet 5   HYDROcodone-acetaminophen (NORCO) 10-325 MG tablet Take 1 tablet by mouth every 6 (six) hours as needed.     No current facility-administered medications for this visit.      PHYSICAL EXAMINATION: ECOG PERFORMANCE STATUS: 0 - Asymptomatic  Vitals:   01/17/24  1007  BP: (!) 159/48  Pulse: 66  Temp: 98.2 F (36.8 C)  SpO2: 100%    Filed Weights   01/17/24 1007  Weight: 96 lb (43.5 kg)    Bulky bilateral neck adenopathy; underarm adenopathy.  Physical Exam HENT:     Head: Normocephalic and atraumatic.     Mouth/Throat:     Pharynx: No oropharyngeal exudate.  Eyes:     Pupils: Pupils are equal, round, and reactive to light.  Cardiovascular:     Rate and Rhythm: Normal rate and regular rhythm.  Pulmonary:     Effort: Pulmonary effort is normal. No respiratory distress.     Breath sounds: Normal breath sounds. No wheezing.  Abdominal:     General: Bowel sounds are normal. There is no distension.     Palpations: Abdomen is soft. There is no mass.     Tenderness: There  is no abdominal tenderness. There is no guarding or rebound.  Musculoskeletal:        General: No tenderness. Normal range of motion.     Cervical back: Normal range of motion and neck supple.  Skin:    General: Skin is warm.     Comments: Chronic multiple bruises noted.  Neurological:     Mental Status: She is alert and oriented to person, place, and time.  Psychiatric:        Mood and Affect: Affect normal.      LABORATORY DATA:  I have reviewed the data as listed Lab Results  Component Value Date   WBC 84.6 (HH) 01/17/2024   HGB 8.8 (L) 01/17/2024   HCT 28.2 (L) 01/17/2024   MCV 104.8 (H) 01/17/2024   PLT 135 (L) 01/17/2024   Recent Labs    05/07/23 1300 06/18/23 0631 06/19/23 0415 09/25/23 0855 10/09/23 0954 11/30/23 1425 12/20/23 0941 01/17/24 0954  NA 139 141   < > 136   < > 140 137 137  K 3.9 3.9   < > 5.1   < > 4.1 4.1 4.3  CL 109 113*   < > 106   < > 107 106 105  CO2 21* 18*   < > 22   < > 23 22 23   GLUCOSE 115* 116*   < > 97   < > 121* 99 154*  BUN 36* 32*   < > 40*   < > 37* 61* 62*  CREATININE 1.27* 1.33*   < > 1.51*   < > 1.67* 1.64* 1.59*  CALCIUM 8.2* 8.4*   < > 8.7*   < > 8.7* 8.5* 8.6*  GFRNONAA 41* 39*   < > 33*   < > 29* 30* 31*  PROT 5.8* 5.6*  --  6.2*  --   --   --   --   ALBUMIN 3.7 3.8  --  4.0  --   --   --   --   AST 15 16  --  15  --   --   --   --   ALT 9 10  --  8  --   --   --   --   ALKPHOS 79 84  --  101  --   --   --   --   BILITOT 0.5 0.7  --  1.1  --   --   --   --    < > = values in this interval not displayed.    RADIOGRAPHIC STUDIES: I have personally  reviewed the radiological images as listed and agreed with the findings in the report. No results found.  ASSESSMENT & PLAN:   CLL (chronic lymphocytic leukemia) (HCC) # CLL: [56% OF NUCLEI POSITIVE FOR A 13Q DELETION]; April 2022-PET scan shows significant lymphadenopathy above and below diaphragm; splenomegaly. Discontinued Ibrutinib [hypertensive emergencies]  acalabrutinib [leg cramps even with minimum dose].Discontinued zanubrutinib. hypokalemia loss and renal failure/extreme fatigue etc.    # Progression of disease given the rising white count. Again declined Rituximab infusion weekly x 4- and other options. ? Chlorambucil-discuss.   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  #OCT 2024- I sat- 11%- Recommend gentle iron [iron biglycinate; 28 mg ] 1 pill a day.  This pill is unlikely to cause stomach upset or cause constipation.  Discussed regarding use of erythropoietin agents. Discussed use of erythropoietin stimulating agents like retacrit to stimulate the bone marrow.  Discussed the potential issues with erythropoietin estimating agents-given the risk of stroke thromboembolic events/elevated blood pressure.  However, most of the serious events did not happen when the goal hematocrit is 33/hemoglobin 30. Pt in agreement.   # Cardiac -history of CHF / elevated blood pressure-overall stable continue monitoring at home- stable   # Worsening renal function GFR 40s.  Underlying diuretic/CHF/possible tumor lysis- stable   # DISPOSITION: # No blood tomorrow # follow up in 4 weeks- MD; labs- cbc/ bmp;iron studies;ferritin; LDH; BNP; erythropoietin;possible Retacrit- HOLD tube; D-2- possible 1 unit PRBC- Dr.B    All questions were answered. The patient knows to call the clinic with any problems, questions or concerns.    Earna Coder, MD 01/17/2024 11:23 AM

## 2024-01-18 ENCOUNTER — Inpatient Hospital Stay: Payer: Medicare Other

## 2024-02-14 ENCOUNTER — Inpatient Hospital Stay (HOSPITAL_BASED_OUTPATIENT_CLINIC_OR_DEPARTMENT_OTHER): Payer: Medicare Other | Admitting: Internal Medicine

## 2024-02-14 ENCOUNTER — Encounter: Payer: Self-pay | Admitting: Internal Medicine

## 2024-02-14 ENCOUNTER — Other Ambulatory Visit: Payer: Self-pay | Admitting: *Deleted

## 2024-02-14 ENCOUNTER — Inpatient Hospital Stay: Payer: Medicare Other

## 2024-02-14 ENCOUNTER — Inpatient Hospital Stay: Payer: Medicare Other | Attending: Nurse Practitioner

## 2024-02-14 VITALS — BP 136/38 | HR 67 | Temp 98.1°F | Ht 64.0 in | Wt 94.6 lb

## 2024-02-14 DIAGNOSIS — Z888 Allergy status to other drugs, medicaments and biological substances status: Secondary | ICD-10-CM | POA: Insufficient documentation

## 2024-02-14 DIAGNOSIS — E611 Iron deficiency: Secondary | ICD-10-CM

## 2024-02-14 DIAGNOSIS — Z90710 Acquired absence of both cervix and uterus: Secondary | ICD-10-CM | POA: Diagnosis not present

## 2024-02-14 DIAGNOSIS — R591 Generalized enlarged lymph nodes: Secondary | ICD-10-CM | POA: Insufficient documentation

## 2024-02-14 DIAGNOSIS — E876 Hypokalemia: Secondary | ICD-10-CM | POA: Diagnosis not present

## 2024-02-14 DIAGNOSIS — N1832 Chronic kidney disease, stage 3b: Secondary | ICD-10-CM | POA: Diagnosis not present

## 2024-02-14 DIAGNOSIS — R161 Splenomegaly, not elsewhere classified: Secondary | ICD-10-CM | POA: Diagnosis not present

## 2024-02-14 DIAGNOSIS — C9110 Chronic lymphocytic leukemia of B-cell type not having achieved remission: Secondary | ICD-10-CM

## 2024-02-14 DIAGNOSIS — I509 Heart failure, unspecified: Secondary | ICD-10-CM | POA: Diagnosis not present

## 2024-02-14 DIAGNOSIS — D638 Anemia in other chronic diseases classified elsewhere: Secondary | ICD-10-CM | POA: Insufficient documentation

## 2024-02-14 DIAGNOSIS — Z885 Allergy status to narcotic agent status: Secondary | ICD-10-CM | POA: Insufficient documentation

## 2024-02-14 DIAGNOSIS — Z79899 Other long term (current) drug therapy: Secondary | ICD-10-CM | POA: Insufficient documentation

## 2024-02-14 DIAGNOSIS — F17200 Nicotine dependence, unspecified, uncomplicated: Secondary | ICD-10-CM | POA: Diagnosis not present

## 2024-02-14 DIAGNOSIS — I130 Hypertensive heart and chronic kidney disease with heart failure and stage 1 through stage 4 chronic kidney disease, or unspecified chronic kidney disease: Secondary | ICD-10-CM | POA: Diagnosis not present

## 2024-02-14 DIAGNOSIS — D649 Anemia, unspecified: Secondary | ICD-10-CM

## 2024-02-14 DIAGNOSIS — E860 Dehydration: Secondary | ICD-10-CM

## 2024-02-14 LAB — CBC WITH DIFFERENTIAL (CANCER CENTER ONLY)
Abs Immature Granulocytes: 0.61 10*3/uL — ABNORMAL HIGH (ref 0.00–0.07)
Basophils Absolute: 0.2 10*3/uL — ABNORMAL HIGH (ref 0.0–0.1)
Basophils Relative: 0 %
Eosinophils Absolute: 2 10*3/uL — ABNORMAL HIGH (ref 0.0–0.5)
Eosinophils Relative: 1 %
HCT: 25.9 % — ABNORMAL LOW (ref 36.0–46.0)
Hemoglobin: 7.8 g/dL — ABNORMAL LOW (ref 12.0–15.0)
Immature Granulocytes: 0 %
Lymphocytes Relative: 93 %
Lymphs Abs: 136.4 10*3/uL — ABNORMAL HIGH (ref 0.7–4.0)
MCH: 32 pg (ref 26.0–34.0)
MCHC: 30.1 g/dL (ref 30.0–36.0)
MCV: 106.1 fL — ABNORMAL HIGH (ref 80.0–100.0)
Monocytes Absolute: 4.7 10*3/uL — ABNORMAL HIGH (ref 0.1–1.0)
Monocytes Relative: 3 %
Neutro Abs: 4.4 10*3/uL (ref 1.7–7.7)
Neutrophils Relative %: 3 %
Platelet Count: 260 10*3/uL (ref 150–400)
RBC: 2.44 MIL/uL — ABNORMAL LOW (ref 3.87–5.11)
RDW: 15.3 % (ref 11.5–15.5)
Smear Review: NORMAL
WBC Count: 148.2 10*3/uL (ref 4.0–10.5)
nRBC: 0 % (ref 0.0–0.2)

## 2024-02-14 LAB — BASIC METABOLIC PANEL
Anion gap: 11 (ref 5–15)
BUN: 57 mg/dL — ABNORMAL HIGH (ref 8–23)
CO2: 23 mmol/L (ref 22–32)
Calcium: 8.6 mg/dL — ABNORMAL LOW (ref 8.9–10.3)
Chloride: 105 mmol/L (ref 98–111)
Creatinine, Ser: 1.83 mg/dL — ABNORMAL HIGH (ref 0.44–1.00)
GFR, Estimated: 26 mL/min — ABNORMAL LOW (ref >60)
Glucose, Bld: 134 mg/dL — ABNORMAL HIGH (ref 70–99)
Potassium: 4.3 mmol/L (ref 3.5–5.1)
Sodium: 139 mmol/L (ref 135–145)

## 2024-02-14 LAB — FERRITIN: Ferritin: 54 ng/mL (ref 11–307)

## 2024-02-14 LAB — LACTATE DEHYDROGENASE: LDH: 143 U/L (ref 98–192)

## 2024-02-14 LAB — IRON AND TIBC
Iron: 75 ug/dL (ref 28–170)
Saturation Ratios: 28 % (ref 10.4–31.8)
TIBC: 270 ug/dL (ref 250–450)
UIBC: 195 ug/dL

## 2024-02-14 LAB — BRAIN NATRIURETIC PEPTIDE: B Natriuretic Peptide: 335.5 pg/mL — ABNORMAL HIGH (ref 0.0–100.0)

## 2024-02-14 MED ORDER — EPOETIN ALFA-EPBX 20000 UNIT/ML IJ SOLN
20000.0000 [IU] | Freq: Once | INTRAMUSCULAR | Status: AC
  Administered 2024-02-14: 20000 [IU] via SUBCUTANEOUS

## 2024-02-14 NOTE — Progress Notes (Signed)
 C/o hand pain 10/10, sees Dr. Burnett Sheng.  No appetite, no supplement drinks.

## 2024-02-14 NOTE — Assessment & Plan Note (Addendum)
#   CLL: [56% OF NUCLEI POSITIVE FOR A 13Q DELETION]; April 2022-PET scan shows significant lymphadenopathy above and below diaphragm; splenomegaly. Discontinued Ibrutinib [hypertensive emergencies] acalabrutinib [leg cramps even with minimum dose].Discontinued zanubrutinib. hypokalemia loss and renal failure/extreme fatigue etc.    # Progression of disease given the rising white count. Again declined Rituximab infusion weekly x 4- and other options.   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  #OCT 2024- I sat- 11%- on  gentle iron [iron biglycinate; 28 mg ] 1 pill a day.   Again discussed regarding use of erythropoietin agents. Discussed use of erythropoietin stimulating agents like retacrit to stimulate the bone marrow.  Discussed the potential issues with erythropoietin estimating agents-given the risk of stroke thromboembolic events/elevated blood pressure.  However, most of the serious events did not happen when the goal hematocrit is 33/hemoglobin 30. Procced with retacrit today.   # Cardiac -history of CHF / elevated blood pressure-overall stable continue monitoring at home- stable   # CKD stage GFR 30- 40s.  Underlying diuretic/CHF/possible tumor lysis- stable   # DISPOSITION: # retacrit today # 1 unit of blood tomorrow # follow up in 4 weeks- MD; labs- cbc/ bmp;iron studies;ferritin; LDH; BNP;Uric acid;mag  possible Retacrit- HOLD tube; D-2- possible 1 unit PRBC- Dr.B

## 2024-02-14 NOTE — Progress Notes (Signed)
 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 Low level hypermetabolism consistent with CLL.2. Splenomegaly but no focal splenic lesions.  # April 2022- 13 Q DEL; IGVH- UNMUTATED  # MID April 2022- IBRUTINIB 280 mg x3 days [STOPPED sec ER/hypertensive emergency- 191/129]  # MAY 26th, 2022- start acalabrutinib 100 mg once a day [reduced dose]; intermittent; discontinued  September mid 2022. [Muscle cramps.]  # MAY 1st week- 2024- Zanubrutinib 80 mg; stopped after 1-2 weeks- sec to acute renal failure hyperkalemia/extreme fatigue etc.  Patient declined further therapies.       CLL (chronic lymphocytic leukemia) (HCC)  02/14/2021 Initial Diagnosis   CLL (chronic lymphocytic leukemia) (HCC)   04/07/2021 Cancer Staging   Staging form: Chronic Lymphocytic Leukemia / Small Lymphocytic Lymphoma, AJCC 8th Edition - Clinical: Modified Rai Stage III (Modified Rai risk: High, Binet: Stage C) - Signed by Earna Coder, MD on 04/07/2021 Stage prefix: Initial diagnosis   12/16/2021 - 12/16/2021 Chemotherapy   Patient is on Treatment Plan : CLL - Rituximab q 4 weeks      HISTORY OF PRESENTING ILLNESS: Frail-appearing Caucasian female patient/accompanied by daughter.  Ambulating independently.  Lindsay [name] 89 y.o.  female symptomatic CLL currently on on surveillance because of intolerance to  ibrutinib/acalabrutinib/zanubrutinib is here for follow-up-currently on best supportive care with blood transfusions.  C/o hand pain bilateral wrist pain sec to arthritis. Currently taking hydrocodone upto 4 times a day. Not helping. Awaiting to see PCP.    No appetite, no supplement drink. Patient has not had any hospitalizations over the last many months.  Patient s/p blood transfusion.   Also noticed significant increasing size of the neck lymph nodes/underarm lymph nodes.  Review of Systems  Constitutional:  Positive for malaise/fatigue and weight loss. Negative for chills, diaphoresis and fever.  HENT:  Negative for nosebleeds and sore throat.   Eyes:  Negative for double vision.  Respiratory:  Negative for cough, hemoptysis, sputum production, shortness of breath and wheezing.   Cardiovascular:  Negative for chest pain, palpitations, orthopnea and leg swelling.  Gastrointestinal:  Negative for abdominal pain, constipation, diarrhea, heartburn, melena, nausea and vomiting.  Genitourinary:  Negative for dysuria, frequency and urgency.  Musculoskeletal:  Positive for joint pain. Negative for back pain.  Skin: Negative.  Negative for itching and rash.  Neurological:  Negative for dizziness, tingling, focal weakness, weakness and headaches.  Endo/Heme/Allergies:  Bruises/bleeds easily.  Psychiatric/Behavioral:  Negative for depression. The patient is not nervous/anxious and does not have insomnia.      MEDICAL HISTORY:  Past Medical History:  Diagnosis Date   Arthritis    RHEUMATOID   CHF (congestive heart failure) (HCC)    CLL (chronic lymphocytic leukemia) (HCC)    Edema    MILD ANKLE OCCAS   Fibromyalgia    GERD (gastroesophageal reflux disease)    Hypertension    Kidney stones    RLS (restless legs syndrome)     SURGICAL HISTORY: Past Surgical History:  Procedure Laterality Date  ABDOMINAL HYSTERECTOMY     CATARACT EXTRACTION W/PHACO Left 02/07/2016   Procedure:  CATARACT EXTRACTION PHACO AND INTRAOCULAR LENS PLACEMENT (IOC);  Surgeon: Sallee Lange, MD;  Location: [facility] ORS;  Service: Ophthalmology;  Laterality: Left;  [place] 01:33 AP% 24.7 CDE 39.57 fluid pack lot # [id number]   EXTRACORPOREAL SHOCK WAVE LITHOTRIPSY Right 07/03/2019   Procedure: EXTRACORPOREAL SHOCK WAVE LITHOTRIPSY (ESWL);  Surgeon: Orson Ape, MD;  Location: [facility] ORS;  Service: Urology;  Laterality: Right;   EYE SURGERY     HIP ARTHROPLASTY Left 04/20/2020   Procedure: ARTHROPLASTY BIPOLAR HIP (HEMIARTHROPLASTY);  Surgeon: Kennedy Bucker, MD;  Location: [facility]S;  Service: Orthopedics;  Laterality: Left;   LITHOTRIPSY     TONSILLECTOMY      SOCIAL HISTORY: Social History   Socioeconomic History   Marital status: Widowed    Spouse name: Not on file   Number of children: Not on file   Years of education: Not on file   Highest education level: Not on file  Occupational History   Not on file  Tobacco Use   Smoking status: Never   Smokeless tobacco: Never  Substance and Sexual Activity   Alcohol use: No   Drug use: Never   Sexual activity: Not Currently  Other Topics Concern   Not on file  Social History Narrative   Lives with daughter, in [place]. 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    Social Drivers of Corporate investment banker Strain: Low Risk  (12/24/2023)   Received from [facility]   Overall Financial Resource Strain (CARDIA)    Difficulty of Paying Living Expenses: Not hard at all  Food Insecurity: No Food Insecurity (12/24/2023)   Received from [facility]   Hunger Vital Sign    Ran Out of Food in the Last Year: Never true    Worried About Running Out of Food in the Last Year: Never true  Transportation Needs: No Transportation Needs (12/24/2023)   Received from [facility]   PRAPARE - Transportation    Lack of Transportation (Non-Medical): No    In the past 12 months, has lack of  transportation kept you from medical appointments or from getting medications?: No  Physical Activity: Not on file  Stress: Not on file  Social Connections: Not on file  Intimate Partner Violence: Not At Risk (09/19/2023)   Humiliation, Afraid, Rape, and Kick questionnaire    Fear of Current or Ex-Partner: No    Emotionally Abused: No    Physically Abused: No    Sexually Abused: No    FAMILY HISTORY: Family History  Problem Relation Age of Onset   Cancer Mother        uterine cancer    ALLERGIES:  is allergic to gabapentin and demerol [meperidine].  MEDICATIONS:  Current Outpatient Medications  Medication Sig Dispense Refill   amitriptyline (ELAVIL) 10 MG tablet Take 20 mg by mouth at bedtime.     cyanocobalamin (,VITAMIN B-12,) 1000 MCG/ML injection Inject into the muscle every 14 (fourteen) days.     cyanocobalamin (VITAMIN B12) 1000 MCG tablet Take 1,000 mcg by mouth daily.     DULoxetine (CYMBALTA) 20 MG capsule Take 20 mg by mouth daily.     Ferrous Sulfate 28 MG TABS Take 28 mg by mouth daily.     furosemide (LASIX) 20 MG tablet Take 2 tablets (40 mg total) by mouth daily. 60 tablet 5   HYDROcodone-acetaminophen (NORCO) 10-325 MG tablet  Take 1 tablet by mouth every 6 (six) hours as needed.     No current facility-administered medications for this visit.   Facility-Administered Medications Ordered in Other Visits  Medication Dose Route Frequency Provider Last Rate Last Admin   epoetin alfa-epbx (RETACRIT) injection 20,000 Units  20,000 Units Subcutaneous Once Earna Coder, MD        PHYSICAL EXAMINATION: ECOG PERFORMANCE STATUS: 0 - Asymptomatic  Vitals:   02/14/24 0958  BP: (!) 136/38  Pulse: 67  Temp: 98.1 F (36.7 C)  SpO2: 99%     Filed Weights   02/14/24 0958  Weight: 94 lb 9.6 oz (42.9 kg)   Bulky bilateral neck adenopathy; underarm adenopathy.  Physical Exam HENT:     Head: Normocephalic and atraumatic.     Mouth/Throat:     Pharynx:  No oropharyngeal exudate.  Eyes:     Pupils: Pupils are equal, round, and reactive to light.  Cardiovascular:     Rate and Rhythm: Normal rate and regular rhythm.  Pulmonary:     Effort: Pulmonary effort is normal. No respiratory distress.     Breath sounds: Normal breath sounds. No wheezing.  Abdominal:     General: Bowel sounds are normal. There is no distension.     Palpations: Abdomen is soft. There is no mass.     Tenderness: There is no abdominal tenderness. There is no guarding or rebound.  Musculoskeletal:        General: No tenderness. Normal range of motion.     Cervical back: Normal range of motion and neck supple.  Skin:    General: Skin is warm.     Comments: Chronic multiple bruises noted.  Neurological:     Mental Status: She is alert and oriented to person, place, and time.  Psychiatric:        Mood and Affect: Affect normal.      LABORATORY DATA:  I have reviewed the data as listed Lab Results  Component Value Date   WBC 148.2 (HH) 02/14/2024   HGB 7.8 (L) 02/14/2024   HCT 25.9 (L) 02/14/2024   MCV 106.1 (H) 02/14/2024   PLT 260 02/14/2024   Recent Labs    05/07/23 1300 06/18/23 0631 06/19/23 0415 09/25/23 0855 10/09/23 0954 12/20/23 0941 01/17/24 0954 02/14/24 0939  NA 139 141   < > 136   < > 137 137 139  K 3.9 3.9   < > 5.1   < > 4.1 4.3 4.3  CL 109 113*   < > 106   < > 106 105 105  CO2 21* 18*   < > 22   < > 22 23 23   GLUCOSE 115* 116*   < > 97   < > 99 154* 134*  BUN 36* 32*   < > 40*   < > 61* 62* 57*  CREATININE 1.27* 1.33*   < > 1.51*   < > 1.64* 1.59* 1.83*  CALCIUM 8.2* 8.4*   < > 8.7*   < > 8.5* 8.6* 8.6*  GFRNONAA 41* 39*   < > 33*   < > 30* 31* 26*  PROT 5.8* 5.6*  --  6.2*  --   --   --   --   ALBUMIN 3.7 3.8  --  4.0  --   --   --   --   AST 15 16  --  15  --   --   --   --  ALT 9 10  --  8  --   --   --   --   ALKPHOS 79 84  --  101  --   --   --   --   BILITOT 0.5 0.7  --  1.1  --   --   --   --    < > = values in this  interval not displayed.    RADIOGRAPHIC STUDIES: I have personally reviewed the radiological images as listed and agreed with the findings in the report. No results found.  ASSESSMENT & PLAN:   CLL (chronic lymphocytic leukemia) (HCC) # CLL: [56% OF NUCLEI POSITIVE FOR A 13Q DELETION]; April 2022-PET scan shows significant lymphadenopathy above and below diaphragm; splenomegaly. Discontinued Ibrutinib [hypertensive emergencies] acalabrutinib [leg cramps even with minimum dose].Discontinued zanubrutinib. hypokalemia loss and renal failure/extreme fatigue etc.    # Progression of disease given the rising white count. Again declined Rituximab infusion weekly x 4- and other options.   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  #OCT 2024- I sat- 11%- on  gentle iron [iron biglycinate; 28 mg ] 1 pill a day.   Again discussed regarding use of erythropoietin agents. Discussed use of erythropoietin stimulating agents like retacrit to stimulate the bone marrow.  Discussed the potential issues with erythropoietin estimating agents-given the risk of stroke thromboembolic events/elevated blood pressure.  However, most of the serious events did not happen when the goal hematocrit is 33/hemoglobin 30. Procced with retacrit today.   # Cardiac -history of CHF / elevated blood pressure-overall stable continue monitoring at home- stable   # CKD stage GFR 30- 40s.  Underlying diuretic/CHF/possible tumor lysis- stable   # DISPOSITION: # retacrit today # 1 unit of blood tomorrow # follow up in 4 weeks- MD; labs- cbc/ bmp;iron studies;ferritin; LDH; BNP;Uric acid;mag  possible Retacrit- HOLD tube; D-2- possible 1 unit PRBC- Dr.B    All questions were answered. The patient knows to call the clinic with any problems, questions or concerns.    Earna Coder, MD 02/14/2024 10:54 AM

## 2024-02-15 ENCOUNTER — Inpatient Hospital Stay: Payer: Medicare Other

## 2024-02-15 DIAGNOSIS — D649 Anemia, unspecified: Secondary | ICD-10-CM

## 2024-02-15 DIAGNOSIS — C9110 Chronic lymphocytic leukemia of B-cell type not having achieved remission: Secondary | ICD-10-CM | POA: Diagnosis not present

## 2024-02-15 LAB — PREPARE RBC (CROSSMATCH)

## 2024-02-15 LAB — ERYTHROPOIETIN: Erythropoietin: 15.8 m[IU]/mL (ref 2.6–18.5)

## 2024-02-15 MED ORDER — SODIUM CHLORIDE 0.9% IV SOLUTION
250.0000 mL | INTRAVENOUS | Status: DC
  Administered 2024-02-15: 50 mL via INTRAVENOUS
  Filled 2024-02-15: qty 250

## 2024-02-15 MED ORDER — ACETAMINOPHEN 325 MG PO TABS
650.0000 mg | ORAL_TABLET | Freq: Once | ORAL | Status: AC
  Administered 2024-02-15: 650 mg via ORAL
  Filled 2024-02-15: qty 2

## 2024-02-15 MED ORDER — DIPHENHYDRAMINE HCL 25 MG PO CAPS
25.0000 mg | ORAL_CAPSULE | Freq: Once | ORAL | Status: AC
  Administered 2024-02-15: 25 mg via ORAL
  Filled 2024-02-15: qty 1

## 2024-02-15 NOTE — Patient Instructions (Signed)
 [facility] - A DEPT OF [facility]  Discharge Instructions: Thank you for choosing [facility] to provide your oncology and hematology care.  If you have a lab appointment with the Cancer Center, please go directly to the Cancer Center and check in at the registration area.  Wear comfortable clothing and clothing appropriate for easy access to any Portacath or PICC line.   We strive to give you quality time with your provider. You may need to reschedule your appointment if you arrive late (15 or more minutes).  Arriving late affects you and other patients whose appointments are after yours.  Also, if you miss three or more appointments without notifying the office, you may be dismissed from the clinic at the provider's discretion.      For prescription refill requests, have your pharmacy contact our office and allow 72 hours for refills to be completed.    Today you received the following agents blood transfusion.      To help prevent nausea and vomiting after your treatment, we encourage you to take your nausea medication as directed.  BELOW ARE SYMPTOMS THAT SHOULD BE REPORTED IMMEDIATELY: *FEVER GREATER THAN 100.4 F (38 C) OR HIGHER *CHILLS OR SWEATING *NAUSEA AND VOMITING THAT IS NOT CONTROLLED WITH YOUR NAUSEA MEDICATION *UNUSUAL SHORTNESS OF BREATH *UNUSUAL BRUISING OR BLEEDING *URINARY PROBLEMS (pain or burning when urinating, or frequent urination) *BOWEL PROBLEMS (unusual diarrhea, constipation, pain near the anus) TENDERNESS IN MOUTH AND THROAT WITH OR WITHOUT PRESENCE OF ULCERS (sore throat, sores in mouth, or a toothache) UNUSUAL RASH, SWELLING OR PAIN  UNUSUAL VAGINAL DISCHARGE OR ITCHING   Items with * indicate a potential emergency and should be followed up as soon as possible or go to the Emergency Department if any problems should occur.  Please show the CHEMOTHERAPY ALERT CARD or IMMUNOTHERAPY ALERT CARD at check-in to  the Emergency Department and triage nurse.  Should you have questions after your visit or need to cancel or reschedule your appointment, please contact [facility] - A DEPT OF [facility]  [phone] and follow the prompts.  Office hours are 8:00 a.m. to 4:30 p.m. Monday - Friday. Please note that voicemails left after 4:00 p.m. may not be returned until the following business day.  We are closed weekends and major holidays. You have access to a nurse at all times for urgent questions. Please call the main number to the clinic [phone] and follow the prompts.  For any non-urgent questions, you may also contact your provider using MyChart. We now offer e-Visits for anyone 96 and older to request care online for non-urgent symptoms. For details visit [email or web address].   Also download the MyChart app! Go to the app store, search "MyChart", open the app, select [place], and log in with your MyChart username and password.

## 2024-02-16 LAB — TYPE AND SCREEN
ABO/RH(D): O POS
Antibody Screen: POSITIVE
DAT, IgG: POSITIVE
DAT, complement: NEGATIVE
Donor AG Type: NEGATIVE
Unit division: 0

## 2024-02-16 LAB — BPAM RBC
Blood Product Expiration Date: 202503152359
ISSUE DATE / TIME: 202502280918
Unit Type and Rh: 5100

## 2024-02-25 ENCOUNTER — Telehealth: Payer: Self-pay

## 2024-02-25 NOTE — Telephone Encounter (Signed)
 Patient's daughter called wanting to know if Retacrit could cause hearing loss.    Ms Graven was seen by PCP on 02/18/24 with concerns of worsening hearing loss in left ear that is improving.

## 2024-02-25 NOTE — Telephone Encounter (Signed)
 Thank you.  Message on daughter's confidential voicemail informing her hearing loss is not a known side effect

## 2024-03-12 ENCOUNTER — Encounter: Payer: Self-pay | Admitting: Internal Medicine

## 2024-03-12 ENCOUNTER — Inpatient Hospital Stay (HOSPITAL_BASED_OUTPATIENT_CLINIC_OR_DEPARTMENT_OTHER): Payer: Medicare Other | Admitting: Internal Medicine

## 2024-03-12 ENCOUNTER — Other Ambulatory Visit: Payer: Self-pay | Admitting: *Deleted

## 2024-03-12 ENCOUNTER — Inpatient Hospital Stay: Payer: Medicare Other | Attending: Nurse Practitioner

## 2024-03-12 ENCOUNTER — Inpatient Hospital Stay: Payer: Medicare Other

## 2024-03-12 VITALS — BP 138/35 | HR 78 | Temp 98.7°F | Resp 20 | Ht 64.0 in | Wt 95.2 lb

## 2024-03-12 DIAGNOSIS — N1832 Chronic kidney disease, stage 3b: Secondary | ICD-10-CM | POA: Diagnosis not present

## 2024-03-12 DIAGNOSIS — R634 Abnormal weight loss: Secondary | ICD-10-CM | POA: Diagnosis not present

## 2024-03-12 DIAGNOSIS — I130 Hypertensive heart and chronic kidney disease with heart failure and stage 1 through stage 4 chronic kidney disease, or unspecified chronic kidney disease: Secondary | ICD-10-CM | POA: Diagnosis not present

## 2024-03-12 DIAGNOSIS — R54 Age-related physical debility: Secondary | ICD-10-CM | POA: Diagnosis not present

## 2024-03-12 DIAGNOSIS — M25531 Pain in right wrist: Secondary | ICD-10-CM | POA: Insufficient documentation

## 2024-03-12 DIAGNOSIS — E611 Iron deficiency: Secondary | ICD-10-CM

## 2024-03-12 DIAGNOSIS — C9110 Chronic lymphocytic leukemia of B-cell type not having achieved remission: Secondary | ICD-10-CM | POA: Diagnosis not present

## 2024-03-12 DIAGNOSIS — D638 Anemia in other chronic diseases classified elsewhere: Secondary | ICD-10-CM | POA: Insufficient documentation

## 2024-03-12 DIAGNOSIS — I509 Heart failure, unspecified: Secondary | ICD-10-CM | POA: Diagnosis not present

## 2024-03-12 DIAGNOSIS — Z885 Allergy status to narcotic agent status: Secondary | ICD-10-CM | POA: Insufficient documentation

## 2024-03-12 DIAGNOSIS — D649 Anemia, unspecified: Secondary | ICD-10-CM

## 2024-03-12 DIAGNOSIS — R161 Splenomegaly, not elsewhere classified: Secondary | ICD-10-CM | POA: Diagnosis not present

## 2024-03-12 DIAGNOSIS — Z79899 Other long term (current) drug therapy: Secondary | ICD-10-CM | POA: Diagnosis not present

## 2024-03-12 DIAGNOSIS — Z90710 Acquired absence of both cervix and uterus: Secondary | ICD-10-CM | POA: Diagnosis not present

## 2024-03-12 DIAGNOSIS — Z888 Allergy status to other drugs, medicaments and biological substances status: Secondary | ICD-10-CM | POA: Insufficient documentation

## 2024-03-12 DIAGNOSIS — M25532 Pain in left wrist: Secondary | ICD-10-CM | POA: Diagnosis not present

## 2024-03-12 DIAGNOSIS — E876 Hypokalemia: Secondary | ICD-10-CM | POA: Insufficient documentation

## 2024-03-12 DIAGNOSIS — E860 Dehydration: Secondary | ICD-10-CM

## 2024-03-12 LAB — CBC WITH DIFFERENTIAL (CANCER CENTER ONLY)
Abs Immature Granulocytes: 0.74 10*3/uL — ABNORMAL HIGH (ref 0.00–0.07)
Basophils Absolute: 0.1 10*3/uL (ref 0.0–0.1)
Basophils Relative: 0 %
Eosinophils Absolute: 2.1 10*3/uL — ABNORMAL HIGH (ref 0.0–0.5)
Eosinophils Relative: 1 %
HCT: 27 % — ABNORMAL LOW (ref 36.0–46.0)
Hemoglobin: 7.8 g/dL — ABNORMAL LOW (ref 12.0–15.0)
Immature Granulocytes: 1 %
Lymphocytes Relative: 92 %
Lymphs Abs: 150.7 10*3/uL — ABNORMAL HIGH (ref 0.7–4.0)
MCH: 31 pg (ref 26.0–34.0)
MCHC: 28.9 g/dL — ABNORMAL LOW (ref 30.0–36.0)
MCV: 107.1 fL — ABNORMAL HIGH (ref 80.0–100.0)
Monocytes Absolute: 3.2 10*3/uL — ABNORMAL HIGH (ref 0.1–1.0)
Monocytes Relative: 2 %
Neutro Abs: 5.7 10*3/uL (ref 1.7–7.7)
Neutrophils Relative %: 4 %
Platelet Count: 166 10*3/uL (ref 150–400)
RBC: 2.52 MIL/uL — ABNORMAL LOW (ref 3.87–5.11)
RDW: 16.7 % — ABNORMAL HIGH (ref 11.5–15.5)
Smear Review: NORMAL
WBC Count: 162.5 10*3/uL (ref 4.0–10.5)
nRBC: 0 % (ref 0.0–0.2)

## 2024-03-12 LAB — BASIC METABOLIC PANEL - CANCER CENTER ONLY
Anion gap: 12 (ref 5–15)
BUN: 43 mg/dL — ABNORMAL HIGH (ref 8–23)
CO2: 24 mmol/L (ref 22–32)
Calcium: 8.7 mg/dL — ABNORMAL LOW (ref 8.9–10.3)
Chloride: 101 mmol/L (ref 98–111)
Creatinine: 1.89 mg/dL — ABNORMAL HIGH (ref 0.44–1.00)
GFR, Estimated: 25 mL/min — ABNORMAL LOW (ref >60)
Glucose, Bld: 151 mg/dL — ABNORMAL HIGH (ref 70–99)
Potassium: 4.4 mmol/L (ref 3.5–5.1)
Sodium: 137 mmol/L (ref 135–145)

## 2024-03-12 LAB — URIC ACID: Uric Acid, Serum: 9.5 mg/dL — ABNORMAL HIGH (ref 2.5–7.1)

## 2024-03-12 LAB — FERRITIN: Ferritin: 74 ng/mL (ref 11–307)

## 2024-03-12 LAB — IRON AND TIBC
Iron: 94 ug/dL (ref 28–170)
Saturation Ratios: 41 % — ABNORMAL HIGH (ref 10.4–31.8)
TIBC: 231 ug/dL — ABNORMAL LOW (ref 250–450)
UIBC: 137 ug/dL

## 2024-03-12 LAB — LACTATE DEHYDROGENASE: LDH: 180 U/L (ref 98–192)

## 2024-03-12 LAB — MAGNESIUM: Magnesium: 2.5 mg/dL — ABNORMAL HIGH (ref 1.7–2.4)

## 2024-03-12 LAB — BRAIN NATRIURETIC PEPTIDE: B Natriuretic Peptide: 403.3 pg/mL — ABNORMAL HIGH (ref 0.0–100.0)

## 2024-03-12 MED ORDER — EPOETIN ALFA-EPBX 10000 UNIT/ML IJ SOLN
20000.0000 [IU] | Freq: Once | INTRAMUSCULAR | Status: AC
  Administered 2024-03-12: 20000 [IU] via SUBCUTANEOUS
  Filled 2024-03-12: qty 2

## 2024-03-12 NOTE — Progress Notes (Signed)
 Critical Lab received by [facility] in lab. WBC 162.5. Readback. Dr Donneta Romberg notified.

## 2024-03-12 NOTE — Progress Notes (Signed)
 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 Low level hypermetabolism consistent with CLL.2. Splenomegaly but no focal splenic lesions.  # April 2022- 13 Q DEL; IGVH- UNMUTATED  # MID April 2022- IBRUTINIB 280 mg x3 days [STOPPED sec ER/hypertensive emergency- 191/129]  # MAY 26th, 2022- start acalabrutinib 100 mg once a day [reduced dose]; intermittent; discontinued  September mid 2022. [Muscle cramps.]  # MAY 1st week- 2024- Zanubrutinib 80 mg; stopped after 1-2 weeks- sec to acute renal failure hyperkalemia/extreme fatigue etc.  Patient declined further therapies.       CLL (chronic lymphocytic leukemia) (HCC)  02/14/2021 Initial Diagnosis   CLL (chronic lymphocytic leukemia) (HCC)   04/07/2021 Cancer Staging   Staging form: Chronic Lymphocytic Leukemia / Small Lymphocytic Lymphoma, AJCC 8th Edition - Clinical: Modified Rai Stage III (Modified Rai risk: High, Binet: Stage C) - Signed by Earna Coder, MD on 04/07/2021 Stage prefix: Initial diagnosis   12/16/2021 - 12/16/2021 Chemotherapy   Patient is on Treatment Plan : CLL - Rituximab q 4 weeks      HISTORY OF PRESENTING ILLNESS: Frail-appearing Caucasian female patient/accompanied by daughter.  Ambulating independently.  Lindsay [name] 89 y.o.  female symptomatic CLL currently on on surveillance because of intolerance to  ibrutinib/acalabrutinib/zanubrutinib is here for follow-up-currently on best supportive care with blood transfusions.  Pt drinking one ensure clear- low sodium  per day, daughter concerned with sodium in it due to to her CHF.   C/o hand pain bilateral wrist pain sec to arthritis. Currently taking hydrocodone upto 4 times a day. No appetite, no supplement drink. Patient has not had any hospitalizations over the last many months.  Patient s/p blood transfusion.   Also noticed significant increasing size of the neck lymph nodes/underarm lymph nodes.  Review of Systems  Constitutional:  Positive for malaise/fatigue and weight loss. Negative for chills, diaphoresis and fever.  HENT:  Negative for nosebleeds and sore throat.   Eyes:  Negative for double vision.  Respiratory:  Negative for cough, hemoptysis, sputum production, shortness of breath and wheezing.   Cardiovascular:  Negative for chest pain, palpitations, orthopnea and leg swelling.  Gastrointestinal:  Negative for abdominal pain, constipation, diarrhea, heartburn, melena, nausea and vomiting.  Genitourinary:  Negative for dysuria, frequency and urgency.  Musculoskeletal:  Positive for joint pain. Negative for back pain.  Skin: Negative.  Negative for itching and rash.  Neurological:  Negative for dizziness, tingling, focal weakness, weakness and headaches.  Endo/Heme/Allergies:  Bruises/bleeds easily.  Psychiatric/Behavioral:  Negative for depression. The patient is not nervous/anxious and does not have insomnia.      MEDICAL HISTORY:  Past Medical History:  Diagnosis Date   Arthritis    RHEUMATOID   CHF (congestive heart failure) (HCC)    CLL (chronic lymphocytic leukemia) (HCC)    Edema    MILD ANKLE OCCAS   Fibromyalgia    GERD (gastroesophageal reflux disease)    Hypertension    Kidney stones    RLS (restless legs syndrome)  SURGICAL HISTORY: Past Surgical History:  Procedure Laterality Date   ABDOMINAL  HYSTERECTOMY     CATARACT EXTRACTION W/PHACO Left 02/07/2016   Procedure: CATARACT EXTRACTION PHACO AND INTRAOCULAR LENS PLACEMENT (IOC);  Surgeon: Sallee Lange, MD;  Location: [facility] ORS;  Service: Ophthalmology;  Laterality: Left;  [place] 01:33 AP% 24.7 CDE 39.57 fluid pack lot # [id number]   EXTRACORPOREAL SHOCK WAVE LITHOTRIPSY Right 07/03/2019   Procedure: EXTRACORPOREAL SHOCK WAVE LITHOTRIPSY (ESWL);  Surgeon: Orson Ape, MD;  Location: [facility] ORS;  Service: Urology;  Laterality: Right;   EYE SURGERY     HIP ARTHROPLASTY Left 04/20/2020   Procedure: ARTHROPLASTY BIPOLAR HIP (HEMIARTHROPLASTY);  Surgeon: Kennedy Bucker, MD;  Location: [facility] ORS;  Service: Orthopedics;  Laterality: Left;   LITHOTRIPSY     TONSILLECTOMY      SOCIAL HISTORY: Social History   Socioeconomic History   Marital status: Widowed    Spouse name: Not on file   Number of children: Not on file   Years of education: Not on file   Highest education level: Not on file  Occupational History   Not on file  Tobacco Use   Smoking status: Never   Smokeless tobacco: Never  Substance and Sexual Activity   Alcohol use: No   Drug use: Never   Sexual activity: Not Currently  Other Topics Concern   Not on file  Social History Narrative   Lives with daughter, in [place]. 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    Social Drivers of Corporate investment banker Strain: Low Risk  (12/24/2023)   Received from [facility]   Overall Financial Resource Strain (CARDIA)    Difficulty of Paying Living Expenses: Not hard at all  Food Insecurity: No Food Insecurity (12/24/2023)   Received from [facility]   Hunger Vital Sign    Ran Out of Food in the Last Year: Never true    Worried About Running Out of Food in the Last Year: Never true  Transportation Needs: No Transportation Needs (12/24/2023)   Received from [facility]   PRAPARE - Transportation    Lack of  Transportation (Non-Medical): No    In the past 12 months, has lack of transportation kept you from medical appointments or from getting medications?: No  Physical Activity: Not on file  Stress: Not on file  Social Connections: Not on file  Intimate Partner Violence: Not At Risk (09/19/2023)   Humiliation, Afraid, Rape, and Kick questionnaire    Fear of Current or Ex-Partner: No    Emotionally Abused: No    Physically Abused: No    Sexually Abused: No    FAMILY HISTORY: Family History  Problem Relation Age of Onset   Cancer Mother        uterine cancer    ALLERGIES:  is allergic to gabapentin and demerol [meperidine].  MEDICATIONS:  Current Outpatient Medications  Medication Sig Dispense Refill   amitriptyline (ELAVIL) 10 MG tablet Take 20 mg by mouth at bedtime.     cyanocobalamin (,VITAMIN B-12,) 1000 MCG/ML injection Inject into the muscle every 14 (fourteen) days.     cyanocobalamin (VITAMIN B12) 1000 MCG tablet Take 1,000 mcg by mouth daily.     DULoxetine (CYMBALTA) 20 MG capsule Take 20 mg by mouth daily.     Ferrous Sulfate 28 MG TABS Take 28 mg by mouth daily.     furosemide (LASIX) 20 MG tablet Take 2 tablets (40 mg total) by mouth  daily. 60 tablet 5   HYDROcodone-acetaminophen (NORCO) 10-325 MG tablet Take 1 tablet by mouth every 6 (six) hours as needed.     MELATONIN GUMMIES PO Take 3 mg by mouth at bedtime as needed.     No current facility-administered medications for this visit.    PHYSICAL EXAMINATION: ECOG PERFORMANCE STATUS: 0 - Asymptomatic  Vitals:   03/12/24 1010  BP: (!) 138/35  Pulse: 78  Resp: 20  Temp: 98.7 F (37.1 C)  SpO2: 100%     Filed Weights   03/12/24 1010  Weight: 95 lb 3.2 oz (43.2 kg)   Bulky bilateral neck adenopathy; underarm adenopathy.  Physical Exam HENT:     Head: Normocephalic and atraumatic.     Mouth/Throat:     Pharynx: No oropharyngeal exudate.  Eyes:     Pupils: Pupils are equal, round, and reactive to  light.  Cardiovascular:     Rate and Rhythm: Normal rate and regular rhythm.  Pulmonary:     Effort: Pulmonary effort is normal. No respiratory distress.     Breath sounds: Normal breath sounds. No wheezing.  Abdominal:     General: Bowel sounds are normal. There is no distension.     Palpations: Abdomen is soft. There is no mass.     Tenderness: There is no abdominal tenderness. There is no guarding or rebound.  Musculoskeletal:        General: No tenderness. Normal range of motion.     Cervical back: Normal range of motion and neck supple.  Skin:    General: Skin is warm.     Comments: Chronic multiple bruises noted.  Neurological:     Mental Status: She is alert and oriented to person, place, and time.  Psychiatric:        Mood and Affect: Affect normal.      LABORATORY DATA:  I have reviewed the data as listed Lab Results  Component Value Date   WBC 162.5 (HH) 03/12/2024   HGB 7.8 (L) 03/12/2024   HCT 27.0 (L) 03/12/2024   MCV 107.1 (H) 03/12/2024   PLT 166 03/12/2024   Recent Labs    05/07/23 1300 06/18/23 0631 06/19/23 0415 09/25/23 0855 10/09/23 0954 01/17/24 0954 02/14/24 0939 03/12/24 0959  NA 139 141   < > 136   < > 137 139 137  K 3.9 3.9   < > 5.1   < > 4.3 4.3 4.4  CL 109 113*   < > 106   < > 105 105 101  CO2 21* 18*   < > 22   < > 23 23 24   GLUCOSE 115* 116*   < > 97   < > 154* 134* 151*  BUN 36* 32*   < > 40*   < > 62* 57* 43*  CREATININE 1.27* 1.33*   < > 1.51*   < > 1.59* 1.83* 1.89*  CALCIUM 8.2* 8.4*   < > 8.7*   < > 8.6* 8.6* 8.7*  GFRNONAA 41* 39*   < > 33*   < > 31* 26* 25*  PROT 5.8* 5.6*  --  6.2*  --   --   --   --   ALBUMIN 3.7 3.8  --  4.0  --   --   --   --   AST 15 16  --  15  --   --   --   --   ALT 9 10  --  8  --   --   --   --  ALKPHOS 79 84  --  101  --   --   --   --   BILITOT 0.5 0.7  --  1.1  --   --   --   --    < > = values in this interval not displayed.    RADIOGRAPHIC STUDIES: I have personally reviewed the  radiological images as listed and agreed with the findings in the report. No results found.  ASSESSMENT & PLAN:   CLL (chronic lymphocytic leukemia) (HCC) # CLL: [56% OF NUCLEI POSITIVE FOR A 13Q DELETION]; April 2022-PET scan shows significant lymphadenopathy above and below diaphragm; splenomegaly. Discontinued Ibrutinib [hypertensive emergencies] acalabrutinib [leg cramps even with minimum dose].Discontinued zanubrutinib. hypokalemia loss and renal failure/extreme fatigue etc.    # Progression of disease given the rising white count. Again declined any treatment options.   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  #OCT 2024- I sat- 11%- on  gentle iron [iron biglycinate; 28 mg ] 1 pill a day.  Proceed with retacrit today.   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   # DISPOSITION: # retacrit today # 1 unit of blood tomorrow # follow up in 4 weeks- MD; labs- cbc/ bmp;iron studies;ferritin; LDH; BNP;Uric acid;mag  possible Retacrit- HOLD tube; D-2- possible 1 unit PRBC- Dr.B    All questions were answered. The patient knows to call the clinic with any problems, questions or concerns.    Earna Coder, MD 03/12/2024 11:24 AM

## 2024-03-12 NOTE — Progress Notes (Signed)
 Pt drinking one ensure clear per day, daughter concerned with sodium in it due to to her CHF. Can she have more than one?

## 2024-03-12 NOTE — Assessment & Plan Note (Signed)
#   CLL: [56% OF NUCLEI POSITIVE FOR A 13Q DELETION]; April 2022-PET scan shows significant lymphadenopathy above and below diaphragm; splenomegaly. Discontinued Ibrutinib [hypertensive emergencies] acalabrutinib [leg cramps even with minimum dose].Discontinued zanubrutinib. hypokalemia loss and renal failure/extreme fatigue etc.    # Progression of disease given the rising white count. Again declined any treatment options.   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  #OCT 2024- I sat- 11%- on  gentle iron [iron biglycinate; 28 mg ] 1 pill a day.  Proceed with retacrit today.   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   # DISPOSITION: # retacrit today # 1 unit of blood tomorrow # follow up in 4 weeks- MD; labs- cbc/ bmp;iron studies;ferritin; LDH; BNP;Uric acid;mag  possible Retacrit- HOLD tube; D-2- possible 1 unit PRBC- Dr.B

## 2024-03-13 ENCOUNTER — Inpatient Hospital Stay: Payer: Medicare Other

## 2024-03-13 DIAGNOSIS — C9110 Chronic lymphocytic leukemia of B-cell type not having achieved remission: Secondary | ICD-10-CM | POA: Diagnosis not present

## 2024-03-13 DIAGNOSIS — D649 Anemia, unspecified: Secondary | ICD-10-CM

## 2024-03-13 LAB — PREPARE RBC (CROSSMATCH)

## 2024-03-13 MED ORDER — ACETAMINOPHEN 325 MG PO TABS
650.0000 mg | ORAL_TABLET | Freq: Once | ORAL | Status: AC
  Administered 2024-03-13: 650 mg via ORAL
  Filled 2024-03-13: qty 2

## 2024-03-13 MED ORDER — SODIUM CHLORIDE 0.9% IV SOLUTION
250.0000 mL | INTRAVENOUS | Status: DC
Start: 2024-03-13 — End: 2024-03-13
  Administered 2024-03-13: 100 mL via INTRAVENOUS
  Filled 2024-03-13: qty 250

## 2024-03-13 MED ORDER — DIPHENHYDRAMINE HCL 25 MG PO CAPS
25.0000 mg | ORAL_CAPSULE | Freq: Once | ORAL | Status: AC
Start: 2024-03-13 — End: 2024-03-13
  Administered 2024-03-13: 25 mg via ORAL
  Filled 2024-03-13: qty 1

## 2024-03-15 LAB — TYPE AND SCREEN
ABO/RH(D): O POS
Antibody Screen: POSITIVE
DAT, IgG: POSITIVE
DAT, complement: POSITIVE
Donor AG Type: NEGATIVE
Unit division: 0

## 2024-03-15 LAB — BPAM RBC
Blood Product Expiration Date: 202504172359
ISSUE DATE / TIME: 202503270937
Unit Type and Rh: 5100

## 2024-03-26 ENCOUNTER — Telehealth: Payer: Self-pay | Admitting: Family

## 2024-03-26 NOTE — Telephone Encounter (Signed)
 Called to confirm/remind patient of their appointment at the [facility] on 03/26/24.   Appointment:   [x] Confirmed  [] Left mess   [] No answer/No voice mail  [] Phone not in service  Patient reminded to bring all medications and/or complete list.  Confirmed patient has transportation. Gave directions, instructed to utilize valet parking.

## 2024-03-27 ENCOUNTER — Encounter: Payer: Self-pay | Admitting: Family

## 2024-03-27 ENCOUNTER — Ambulatory Visit: Attending: Family | Admitting: Family

## 2024-03-27 VITALS — BP 124/30 | HR 67 | Wt 95.0 lb

## 2024-03-27 DIAGNOSIS — D519 Vitamin B12 deficiency anemia, unspecified: Secondary | ICD-10-CM | POA: Diagnosis not present

## 2024-03-27 DIAGNOSIS — M797 Fibromyalgia: Secondary | ICD-10-CM | POA: Insufficient documentation

## 2024-03-27 DIAGNOSIS — Z79899 Other long term (current) drug therapy: Secondary | ICD-10-CM | POA: Insufficient documentation

## 2024-03-27 DIAGNOSIS — E875 Hyperkalemia: Secondary | ICD-10-CM | POA: Insufficient documentation

## 2024-03-27 DIAGNOSIS — I5022 Chronic systolic (congestive) heart failure: Secondary | ICD-10-CM | POA: Diagnosis not present

## 2024-03-27 DIAGNOSIS — I10 Essential (primary) hypertension: Secondary | ICD-10-CM

## 2024-03-27 DIAGNOSIS — I428 Other cardiomyopathies: Secondary | ICD-10-CM | POA: Insufficient documentation

## 2024-03-27 DIAGNOSIS — Z87440 Personal history of urinary (tract) infections: Secondary | ICD-10-CM | POA: Insufficient documentation

## 2024-03-27 DIAGNOSIS — D649 Anemia, unspecified: Secondary | ICD-10-CM | POA: Insufficient documentation

## 2024-03-27 DIAGNOSIS — I130 Hypertensive heart and chronic kidney disease with heart failure and stage 1 through stage 4 chronic kidney disease, or unspecified chronic kidney disease: Secondary | ICD-10-CM | POA: Insufficient documentation

## 2024-03-27 DIAGNOSIS — G2581 Restless legs syndrome: Secondary | ICD-10-CM | POA: Diagnosis not present

## 2024-03-27 DIAGNOSIS — C9110 Chronic lymphocytic leukemia of B-cell type not having achieved remission: Secondary | ICD-10-CM | POA: Diagnosis not present

## 2024-03-27 DIAGNOSIS — I340 Nonrheumatic mitral (valve) insufficiency: Secondary | ICD-10-CM | POA: Insufficient documentation

## 2024-03-27 NOTE — Progress Notes (Signed)
 [facility] - [facility] FAILURE CLINIC - PHARMACIST COUNSELING NOTE  Adherence Assessment  Do you ever forget to take your medication? [] Yes [x] No  Do you ever skip doses due to side effects?  [] Yes [x] No  Do you have trouble affording your medicines? [] Yes [x] No  Are you ever unable to pick up your medication due to transportation difficulties? [] Yes [x] No  Do you ever stop taking your medications because you don't believe they are helping? [] Yes [x] No  Do you check your weight daily?  [] Yes [x] No  Do you check your blood pressure daily? Normal reading for her  [] Yes [x] No  Adherence strategy: Pill box   Barriers to obtaining medications: None reported    Vital signs: HR 67, BP 124/30, weight 95 lbs.  ECHO: Date 06/19/23, EF 40-45% Renal function: Date 03/12/24, GFR 25  Current Guideline-Directed Medical Therapy/Evidence Based Medicine  ACE/ARB/ARNI: N/A Target dose: N/A  Beta Blocker:  N/A Target dose: N/A  Aldosterone Antagonist:  Not appropriate given current renal function / history of hyperkalemia Target dose: N/A  SGLT2i:  Not appropriate given history of recurrent UTIs Target dose: N/A  Diuretic: Furosemide 40 mg daily  ASSESSMENT 89 year old female who presents to the HF clinic for a 3 month follow up appointment. PMH is significant for HTN, CKD, CLL (not taking chemo), anemia, LBBB, IBS, fibromyalgia, GERD, RLS and heart failure.   Recent ED visit or hospitalization (past 6 months): None  PLAN Continue current regimen as directed by NP Can consider a RAAS inhibitor in the future if needed for BP management  Annual ECHO due in July   Time spent: 20 minutes   Littie Deeds, PharmD Pharmacy Resident  03/27/2024 1:08 PM

## 2024-03-27 NOTE — Progress Notes (Signed)
 Advanced [facility] Failure Clinic Note    PCP: Jerl Mina, MD (last seen 03/25) Primary Cardiologist: None  Chief Complaint:   HPI:  Lindsay [name] is a 89 y/o female with a history of HTN, CKD, CLL (not taking chemo), anemia, LBBB, IBS, fibromyalgia, GERD, RLS and heart failure. She was unable to tolerate treatment of CLL with zanubrutinib due to hyperkalemia, renal failure and extreme fatigue and has elected to stop any other treatment for this.  Echo 05/30/22: EF of 45-50% along with mild/moderate MR  Admitted 06/18/23 due to shortness of breath due to HF exacerbation. Chest x-ray showed cardiomegaly and interstitial edema. BNP 1254, troponin 80--> 116. IV diuresed. Echo 06/19/23: EF 40-45% along with mild LVH, Grade II DD, mildly elevated PA pressure, mild/ moderate MR with moderate AS with Aortic valve area, by VTI measures 1.00 cm. Aortic valve mean gradient measures 13.0 mmHg.   Admitted 09/19/23 due to shortness of breath. Had SIRS without infection likely d/t anemia with history of CLL. Also found to be anemic with HG 5.7. Received 2 units of PRBC's with IV lasix afterwards. Symptoms improved. Lindsay [name] was stopped due to hypotension.   She presents today for a HF follow-up visit with a chief complaint of very little fatigue with exertion. Has no other complaints and specifically denies shortness of breath, chest pain, palpitations, abdominal distention, pedal edema, dizziness, weight gain or difficulty sleeping.   Continues to decline treatment of her CLL. Has had side effects from previous medications and doesn't desire to go through that again.   ROS: All systems negative except what is listed in HPI, PMH and Problem List    Past Medical History:  Diagnosis Date   Arthritis    RHEUMATOID   CHF (congestive heart failure) (HCC)    CLL (chronic lymphocytic leukemia) (HCC)    Edema    MILD ANKLE OCCAS   Fibromyalgia    GERD (gastroesophageal reflux disease)    Hypertension     Kidney stones    RLS (restless legs syndrome)     Current Outpatient Medications  Medication Sig Dispense Refill   amitriptyline (ELAVIL) 10 MG tablet Take 20 mg by mouth at bedtime.     cyanocobalamin (,VITAMIN B-12,) 1000 MCG/ML injection Inject into the muscle every 14 (fourteen) days.     cyanocobalamin (VITAMIN B12) 1000 MCG tablet Take 1,000 mcg by mouth daily.     DULoxetine (CYMBALTA) 20 MG capsule Take 20 mg by mouth daily.     Ferrous Sulfate 28 MG TABS Take 28 mg by mouth daily.     furosemide (LASIX) 20 MG tablet Take 2 tablets (40 mg total) by mouth daily. 60 tablet 5   HYDROcodone-acetaminophen (NORCO) 10-325 MG tablet Take 1 tablet by mouth every 6 (six) hours as needed.     MELATONIN GUMMIES PO Take 3 mg by mouth at bedtime as needed.     No current facility-administered medications for this visit.    Allergies  Allergen Reactions   Gabapentin Swelling    05/01/23: Pt stated she has facial swelling when taking Gabapentin.   Demerol [Meperidine]       Social History   Socioeconomic History   Marital status: Widowed    Spouse name: Not on file   Number of children: Not on file   Years of education: Not on file   Highest education level: Not on file  Occupational History   Not on file  Tobacco Use   Smoking status: Never   Smokeless  tobacco: Never  Substance and Sexual Activity   Alcohol use: No   Drug use: Never   Sexual activity: Not Currently  Other Topics Concern   Not on file  Social History Narrative   Lives with daughter, in [place]. 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    Social Drivers of Corporate investment banker Strain: Low Risk  (12/24/2023)   Received from [facility]   Overall Financial Resource Strain (CARDIA)    Difficulty of Paying Living Expenses: Not hard at all  Food Insecurity: No Food Insecurity (12/24/2023)   Received from [facility]   Hunger Vital Sign    Ran Out of Food in  the Last Year: Never true    Worried About Running Out of Food in the Last Year: Never true  Transportation Needs: No Transportation Needs (12/24/2023)   Received from [facility]   PRAPARE - Transportation    Lack of Transportation (Non-Medical): No    In the past 12 months, has lack of transportation kept you from medical appointments or from getting medications?: No  Physical Activity: Not on file  Stress: Not on file  Social Connections: Not on file  Intimate Partner Violence: Not At Risk (09/19/2023)   Humiliation, Afraid, Rape, and Kick questionnaire    Fear of Current or Ex-Partner: No    Emotionally Abused: No    Physically Abused: No    Sexually Abused: No      Family History  Problem Relation Age of Onset   Cancer Mother        uterine cancer   Vitals:   03/27/24 0909  BP: (!) 124/30  Pulse: 67  SpO2: 99%  Weight: 95 lb (43.1 kg)   Wt Readings from Last 3 Encounters:  03/27/24 95 lb (43.1 kg)  03/12/24 95 lb 3.2 oz (43.2 kg)  02/14/24 94 lb 9.6 oz (42.9 kg)   Lab Results  Component Value Date   CREATININE 1.89 (H) 03/12/2024   CREATININE 1.83 (H) 02/14/2024   CREATININE 1.59 (H) 01/17/2024    PHYSICAL EXAM:  General: Well appearing. No resp difficulty HEENT: normal Neck: supple, no JVD Cor: Regular rhythm, rate. No rubs, gallops or murmurs Lungs: clear Abdomen: soft, nontender, nondistended. Extremities: no cyanosis, clubbing, rash, edema; knuckles on fingers enlarged Neuro: alert & oriented X 3. Moves all 4 extremities w/o difficulty. Affect pleasant   ECG: not done   ASSESSMENT & PLAN:  1: NICM with mildly reduced ejection fraction- - likely due to HTN/ CLL - NYHA class II - euvolemic - weighing daily; reminded to call for an overnight weight gain of > 2 pounds or a weekly weight gain of > 5 pounds - weight stable from last visit here 3 months ago - Echo 05/30/22: EF of 45-50% along with mild/moderate MR - Echo 06/19/23: EF  40-45% along with mild LVH, Grade II DD, mildly elevated PA pressure, mild/ moderate MR with moderate AS with Aortic valve area, by VTI measures 1.00 cm. Aortic valve mean gradient measures 13.0 mmHg.  - continue furosemide 40mg  daily - has had hypotension in the past so may not be able to tolerate ARB/ ARNi - has had elevated K+ in the past so unable to use spironolactone - history of recurrent UTI so will not be a candidate for SGLT2 - will make cardiology referral so she can be established; she prefers [facility] so referral made to them - not candidate for advanced therapies due  to progressing CLL - BNP 09/19/23 was 1417.2  2: HTN- - BP 124/30 - saw PCP Lindsay [name]) 03/25 - BMP 03/12/24 reviewed: sodium 137, potassium 4.4, creatinine 1.89 and GFR 25  3: CLL- - saw oncology Lindsay [name]) 03/26 - WBC 03/12/24 was 162.5 - disease progressing with rising WBC count; continues to decline treatment  4: 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   Delma Freeze, FNP 03/27/24

## 2024-03-27 NOTE — Patient Instructions (Signed)
 Referrals:  We have placed a referral today to General Cardiology. They should reach out to you within 1-2 weeks. If they do not, please reach out to them. Their information is below on this AVS. You can also go down to Level Lower to get scheduled if you prefer.   Follow-Up in: 3 months with Clarisa Kindred, FNP.  At the [facility], you and your health needs are our priority. We have a designated team specialized in the treatment of Heart Failure. This Care Team includes your primary Heart Failure Specialized Cardiologist (physician), Advanced Practice Providers (APPs- Physician Assistants and Nurse Practitioners), and Pharmacist who all work together to provide you with the care you need, when you need it.   You may see any of the following providers on your designated Care Team at your next follow up:  Dr. Arvilla Meres Dr. Marca Ancona Dr. Dorthula Nettles Dr. Theresia Bough Clarisa Kindred, FNP Enos Fling, RPH-CPP  Please be sure to bring in all your medications bottles to every appointment.   Need to Contact us:  If you have any questions or concerns before your next appointment please send [place] a message through [place] or call our office at [phone].    TO LEAVE A MESSAGE FOR THE NURSE SELECT OPTION 2, PLEASE LEAVE A MESSAGE INCLUDING: YOUR NAME DATE OF BIRTH CALL BACK NUMBER REASON FOR CALL**this is important as we prioritize the call backs  YOU WILL RECEIVE A CALL BACK THE SAME DAY AS LONG AS YOU CALL BEFORE 4:00 PM

## 2024-03-31 ENCOUNTER — Inpatient Hospital Stay

## 2024-03-31 ENCOUNTER — Inpatient Hospital Stay (HOSPITAL_BASED_OUTPATIENT_CLINIC_OR_DEPARTMENT_OTHER): Admitting: Internal Medicine

## 2024-03-31 ENCOUNTER — Inpatient Hospital Stay: Attending: Nurse Practitioner

## 2024-03-31 ENCOUNTER — Encounter: Payer: Self-pay | Admitting: Internal Medicine

## 2024-03-31 ENCOUNTER — Other Ambulatory Visit: Payer: Self-pay | Admitting: *Deleted

## 2024-03-31 VITALS — BP 142/35 | HR 68 | Temp 98.2°F | Resp 14 | Wt 95.6 lb

## 2024-03-31 DIAGNOSIS — I130 Hypertensive heart and chronic kidney disease with heart failure and stage 1 through stage 4 chronic kidney disease, or unspecified chronic kidney disease: Secondary | ICD-10-CM | POA: Insufficient documentation

## 2024-03-31 DIAGNOSIS — C9110 Chronic lymphocytic leukemia of B-cell type not having achieved remission: Secondary | ICD-10-CM

## 2024-03-31 DIAGNOSIS — E876 Hypokalemia: Secondary | ICD-10-CM | POA: Insufficient documentation

## 2024-03-31 DIAGNOSIS — I509 Heart failure, unspecified: Secondary | ICD-10-CM | POA: Diagnosis not present

## 2024-03-31 DIAGNOSIS — D649 Anemia, unspecified: Secondary | ICD-10-CM

## 2024-03-31 DIAGNOSIS — E611 Iron deficiency: Secondary | ICD-10-CM

## 2024-03-31 DIAGNOSIS — Z79899 Other long term (current) drug therapy: Secondary | ICD-10-CM | POA: Insufficient documentation

## 2024-03-31 DIAGNOSIS — N183 Chronic kidney disease, stage 3 unspecified: Secondary | ICD-10-CM | POA: Diagnosis not present

## 2024-03-31 DIAGNOSIS — E860 Dehydration: Secondary | ICD-10-CM

## 2024-03-31 LAB — CBC WITH DIFFERENTIAL (CANCER CENTER ONLY)
Abs Immature Granulocytes: 0.58 10*3/uL — ABNORMAL HIGH (ref 0.00–0.07)
Basophils Absolute: 0.1 10*3/uL (ref 0.0–0.1)
Basophils Relative: 0 %
Eosinophils Absolute: 1.8 10*3/uL — ABNORMAL HIGH (ref 0.0–0.5)
Eosinophils Relative: 1 %
HCT: 21.4 % — ABNORMAL LOW (ref 36.0–46.0)
Hemoglobin: 6.4 g/dL — CL (ref 12.0–15.0)
Immature Granulocytes: 0 %
Lymphocytes Relative: 91 %
Lymphs Abs: 122.7 10*3/uL — ABNORMAL HIGH (ref 0.7–4.0)
MCH: 30.5 pg (ref 26.0–34.0)
MCHC: 29.9 g/dL — ABNORMAL LOW (ref 30.0–36.0)
MCV: 101.9 fL — ABNORMAL HIGH (ref 80.0–100.0)
Monocytes Absolute: 5.8 10*3/uL — ABNORMAL HIGH (ref 0.1–1.0)
Monocytes Relative: 4 %
Neutro Abs: 5.2 10*3/uL (ref 1.7–7.7)
Neutrophils Relative %: 4 %
Platelet Count: 173 10*3/uL (ref 150–400)
RBC: 2.1 MIL/uL — ABNORMAL LOW (ref 3.87–5.11)
RDW: 22.5 % — ABNORMAL HIGH (ref 11.5–15.5)
Smear Review: NORMAL
WBC Count: 136.1 10*3/uL (ref 4.0–10.5)
nRBC: 0 % (ref 0.0–0.2)

## 2024-03-31 LAB — IRON AND TIBC
Iron: 104 ug/dL (ref 28–170)
Saturation Ratios: 41 % — ABNORMAL HIGH (ref 10.4–31.8)
TIBC: 253 ug/dL (ref 250–450)
UIBC: 149 ug/dL

## 2024-03-31 LAB — BASIC METABOLIC PANEL - CANCER CENTER ONLY
Anion gap: 10 (ref 5–15)
BUN: 77 mg/dL — ABNORMAL HIGH (ref 8–23)
CO2: 19 mmol/L — ABNORMAL LOW (ref 22–32)
Calcium: 8 mg/dL — ABNORMAL LOW (ref 8.9–10.3)
Chloride: 107 mmol/L (ref 98–111)
Creatinine: 1.78 mg/dL — ABNORMAL HIGH (ref 0.44–1.00)
GFR, Estimated: 27 mL/min — ABNORMAL LOW (ref >60)
Glucose, Bld: 102 mg/dL — ABNORMAL HIGH (ref 70–99)
Potassium: 3.5 mmol/L (ref 3.5–5.1)
Sodium: 136 mmol/L (ref 135–145)

## 2024-03-31 LAB — URIC ACID: Uric Acid, Serum: 10.7 mg/dL — ABNORMAL HIGH (ref 2.5–7.1)

## 2024-03-31 LAB — MAGNESIUM: Magnesium: 2.4 mg/dL (ref 1.7–2.4)

## 2024-03-31 LAB — LACTATE DEHYDROGENASE: LDH: 161 U/L (ref 98–192)

## 2024-03-31 LAB — FERRITIN: Ferritin: 70 ng/mL (ref 11–307)

## 2024-03-31 LAB — BRAIN NATRIURETIC PEPTIDE: B Natriuretic Peptide: 636.2 pg/mL — ABNORMAL HIGH (ref 0.0–100.0)

## 2024-03-31 MED ORDER — EPOETIN ALFA-EPBX 10000 UNIT/ML IJ SOLN
20000.0000 [IU] | Freq: Once | INTRAMUSCULAR | Status: AC
  Administered 2024-03-31: 20000 [IU] via SUBCUTANEOUS
  Filled 2024-03-31: qty 2

## 2024-03-31 NOTE — Progress Notes (Unsigned)
 [facility] CONSULT NOTE  Patient Care Team: Jerl Mina, MD as PCP - General (Family Medicine) Earna Coder, MD as Consulting Physician (Internal Medicine)  CHIEF COMPLAINTS/PURPOSE OF CONSULTATION: CLL  #  Oncology History Overview Note  # LYMPHOCYTOSIS [25,K]/ANEMIA 9-10]; platelets-N; IgVH Somatic Hypermutation was not detected.56% OF NUCLEI POSITIVE FOR A 13Q DELETION; The phenotype is most consistent with a diagnosis of 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 Low level hypermetabolism consistent with CLL.2. Splenomegaly but no focal splenic lesions.  # April 2022- 13 Q DEL; IGVH- UNMUTATED  # MID April 2022- IBRUTINIB 280 mg x3 days [STOPPED sec ER/hypertensive emergency- 191/129]  # MAY 26th, 2022- start acalabrutinib 100 mg once a day [reduced dose]; intermittent; discontinued  September mid 2022. [Muscle cramps.]  # MAY 1st week- 2024- Zanubrutinib 80 mg; stopped after 1-2 weeks- sec to acute renal failure hyperkalemia/extreme fatigue etc.  Patient declined further therapies.       CLL (chronic lymphocytic leukemia) (HCC)  02/14/2021 Initial Diagnosis   CLL (chronic lymphocytic leukemia) (HCC)   04/07/2021 Cancer Staging   Staging form: Chronic Lymphocytic Leukemia / Small Lymphocytic Lymphoma, AJCC 8th Edition - Clinical: Modified Rai Stage III (Modified Rai risk: High, Binet: Stage C) - Signed by Earna Coder, MD on 04/07/2021 Stage prefix: Initial diagnosis   12/16/2021 - 12/16/2021 Chemotherapy   Patient is on Treatment Plan : CLL - Rituximab q 4 weeks      HISTORY OF PRESENTING ILLNESS: Frail-appearing Caucasian female patient/accompanied by daughter.  Ambulating independently.  [name] 89 y.o.  female symptomatic CLL currently on on surveillance because of intolerance to  ibrutinib/acalabrutinib/zanubrutinib is here for follow-up-currently on best supportive care with blood transfusions.  Pt drinking one ensure clear- low sodium  per day, daughter concerned with sodium in it due to to her CHF.   C/o hand pain bilateral wrist pain sec to arthritis. Currently taking hydrocodone upto 4 times a day. No appetite, no supplement drink. Patient has not had any hospitalizations over the last many months.  Patient s/p blood transfusion.   Also noticed significant increasing size of the neck lymph nodes/underarm lymph nodes.  Review of Systems  Constitutional:  Positive for malaise/fatigue and weight loss. Negative for chills, diaphoresis and fever.  HENT:  Negative for nosebleeds and sore throat.   Eyes:  Negative for double vision.  Respiratory:  Negative for cough, hemoptysis, sputum production, shortness of breath and wheezing.   Cardiovascular:  Negative for chest pain, palpitations, orthopnea and leg swelling.  Gastrointestinal:  Negative for abdominal pain, constipation, diarrhea, heartburn, melena, nausea and vomiting.  Genitourinary:  Negative for dysuria, frequency and urgency.  Musculoskeletal:  Positive for joint pain. Negative for back pain.  Skin: Negative.  Negative for itching and rash.  Neurological:  Negative for dizziness, tingling, focal weakness, weakness and headaches.  Endo/Heme/Allergies:  Bruises/bleeds easily.  Psychiatric/Behavioral:  Negative for depression. The patient is not nervous/anxious and does not have insomnia.      MEDICAL HISTORY:  Past Medical History:  Diagnosis Date   Arthritis    RHEUMATOID   CHF (congestive heart failure) (HCC)    CLL (chronic lymphocytic leukemia) (HCC)    Edema    MILD ANKLE OCCAS   Fibromyalgia    GERD (gastroesophageal reflux disease)    Hypertension    Kidney stones    RLS (restless legs syndrome)  SURGICAL HISTORY: Past Surgical History:  Procedure Laterality Date   ABDOMINAL  HYSTERECTOMY     CATARACT EXTRACTION W/PHACO Left 02/07/2016   Procedure: CATARACT EXTRACTION PHACO AND INTRAOCULAR LENS PLACEMENT (IOC);  Surgeon: Sallee Lange, MD;  Location: [facility] ORS;  Service: Ophthalmology;  Laterality: Left;  [place] 01:33 AP% 24.7 CDE 39.57 fluid pack lot # [id number]   EXTRACORPOREAL SHOCK WAVE LITHOTRIPSY Right 07/03/2019   Procedure: EXTRACORPOREAL SHOCK WAVE LITHOTRIPSY (ESWL);  Surgeon: Orson Ape, MD;  Location: [facility] ORS;  Service: Urology;  Laterality: Right;   EYE SURGERY     HIP ARTHROPLASTY Left 04/20/2020   Procedure: ARTHROPLASTY BIPOLAR HIP (HEMIARTHROPLASTY);  Surgeon: Kennedy Bucker, MD;  Location: [facility] ORS;  Service: Orthopedics;  Laterality: Left;   LITHOTRIPSY     TONSILLECTOMY      SOCIAL HISTORY: Social History   Socioeconomic History   Marital status: Widowed    Spouse name: Not on file   Number of children: Not on file   Years of education: Not on file   Highest education level: Not on file  Occupational History   Not on file  Tobacco Use   Smoking status: Never   Smokeless tobacco: Never  Substance and Sexual Activity   Alcohol use: No   Drug use: Never   Sexual activity: Not Currently  Other Topics Concern   Not on file  Social History Narrative   Lives with daughter, in [place]. 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    Social Drivers of Corporate investment banker Strain: Low Risk  (12/24/2023)   Received from [facility]   Overall Financial Resource Strain (CARDIA)    Difficulty of Paying Living Expenses: Not hard at all  Food Insecurity: No Food Insecurity (12/24/2023)   Received from [facility]   Hunger Vital Sign    Ran Out of Food in the Last Year: Never true    Worried About Running Out of Food in the Last Year: Never true  Transportation Needs: No Transportation Needs (12/24/2023)   Received from [facility]   PRAPARE - Transportation    Lack of  Transportation (Non-Medical): No    In the past 12 months, has lack of transportation kept you from medical appointments or from getting medications?: No  Physical Activity: Not on file  Stress: Not on file  Social Connections: Not on file  Intimate Partner Violence: Not At Risk (09/19/2023)   Humiliation, Afraid, Rape, and Kick questionnaire    Fear of Current or Ex-Partner: No    Emotionally Abused: No    Physically Abused: No    Sexually Abused: No    FAMILY HISTORY: Family History  Problem Relation Age of Onset   Cancer Mother        uterine cancer    ALLERGIES:  is allergic to gabapentin and demerol [meperidine].  MEDICATIONS:  Current Outpatient Medications  Medication Sig Dispense Refill   amitriptyline (ELAVIL) 10 MG tablet Take 20 mg by mouth at bedtime.     cyanocobalamin (,VITAMIN B-12,) 1000 MCG/ML injection Inject into the muscle every 14 (fourteen) days. Next injection 4/16     cyanocobalamin (VITAMIN B12) 1000 MCG tablet Take 1,000 mcg by mouth daily.     DULoxetine (CYMBALTA) 20 MG capsule Take 20 mg by mouth daily.     Ferrous Sulfate 28 MG TABS Take 28 mg by mouth daily.     furosemide (LASIX) 20 MG tablet Take 2 tablets (40 mg  total) by mouth daily. 60 tablet 5   HYDROcodone-acetaminophen (NORCO) 10-325 MG tablet Take 1 tablet by mouth every 6 (six) hours as needed.     MELATONIN GUMMIES PO Take 3 mg by mouth at bedtime as needed.     No current facility-administered medications for this visit.   Facility-Administered Medications Ordered in Other Visits  Medication Dose Route Frequency Provider Last Rate Last Admin   0.9 %  sodium chloride infusion (Manually program via Guardrails IV Fluids)  250 mL Intravenous Continuous Earna Coder, MD   Stopped at 04/01/24 1100    PHYSICAL EXAMINATION: ECOG PERFORMANCE STATUS: 0 - Asymptomatic  Vitals:   03/31/24 1545  BP: (!) 142/35  Pulse: 68  Resp: 14  Temp: 98.2 F (36.8 C)  SpO2: 100%      Filed Weights   03/31/24 1545  Weight: 95 lb 9.6 oz (43.4 kg)   Bulky bilateral neck adenopathy; underarm adenopathy.  Physical Exam HENT:     Head: Normocephalic and atraumatic.     Mouth/Throat:     Pharynx: No oropharyngeal exudate.  Eyes:     Pupils: Pupils are equal, round, and reactive to light.  Cardiovascular:     Rate and Rhythm: Normal rate and regular rhythm.  Pulmonary:     Effort: Pulmonary effort is normal. No respiratory distress.     Breath sounds: Normal breath sounds. No wheezing.  Abdominal:     General: Bowel sounds are normal. There is no distension.     Palpations: Abdomen is soft. There is no mass.     Tenderness: There is no abdominal tenderness. There is no guarding or rebound.  Musculoskeletal:        General: No tenderness. Normal range of motion.     Cervical back: Normal range of motion and neck supple.  Skin:    General: Skin is warm.     Comments: Chronic multiple bruises noted.  Neurological:     Mental Status: She is alert and oriented to person, place, and time.  Psychiatric:        Mood and Affect: Affect normal.      LABORATORY DATA:  I have reviewed the data as listed Lab Results  Component Value Date   WBC 136.1 (HH) 03/31/2024   HGB 6.4 (LL) 03/31/2024   HCT 21.4 (L) 03/31/2024   MCV 101.9 (H) 03/31/2024   PLT 173 03/31/2024   Recent Labs    05/07/23 1300 06/18/23 0631 06/19/23 0415 09/25/23 0855 10/09/23 0954 02/14/24 0939 03/12/24 0959 03/31/24 1530  NA 139 141   < > 136   < > 139 137 136  K 3.9 3.9   < > 5.1   < > 4.3 4.4 3.5  CL 109 113*   < > 106   < > 105 101 107  CO2 21* 18*   < > 22   < > 23 24 19*  GLUCOSE 115* 116*   < > 97   < > 134* 151* 102*  BUN 36* 32*   < > 40*   < > 57* 43* 77*  CREATININE 1.27* 1.33*   < > 1.51*   < > 1.83* 1.89* 1.78*  CALCIUM 8.2* 8.4*   < > 8.7*   < > 8.6* 8.7* 8.0*  GFRNONAA 41* 39*   < > 33*   < > 26* 25* 27*  PROT 5.8* 5.6*  --  6.2*  --   --   --   --  ALBUMIN  3.7 3.8  --  4.0  --   --   --   --   AST 15 16  --  15  --   --   --   --   ALT 9 10  --  8  --   --   --   --   ALKPHOS 79 84  --  101  --   --   --   --   BILITOT 0.5 0.7  --  1.1  --   --   --   --    < > = values in this interval not displayed.    RADIOGRAPHIC STUDIES: I have personally reviewed the radiological images as listed and agreed with the findings in the report. No results found.  ASSESSMENT & PLAN:   CLL (chronic lymphocytic leukemia) (HCC) # CLL: [56% OF NUCLEI POSITIVE FOR A 13Q DELETION]; April 2022-PET scan shows significant lymphadenopathy above and below diaphragm; splenomegaly. Discontinued Ibrutinib [hypertensive emergencies] acalabrutinib [leg cramps even with minimum dose].Discontinued zanubrutinib. hypokalemia loss and renal failure/extreme fatigue etc.    # Progression of disease given the rising white count. Again declined any treatment options.   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  #OCT 2024- I sat- 11%- on  gentle iron [iron biglycinate; 28 mg ] 1 pill a day.  Proceed with retacrit today.   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   # DISPOSITION: # retacrit today # 1 unit of blood tomorrow # in 2 weeks- labs- cbc/HOLD tube; retacrit- D-2 possible 1 unit PRBC # follow up in 4 weeks- MD; labs- cbc/ bmp; LDH; possible Retacrit- HOLD tube; D-2- possible 1 unit PRBC- Dr.B All questions were answered. The patient knows to call the clinic with any problems, questions or concerns.    Gwyn Leos, MD 04/01/2024 2:51 PM

## 2024-03-31 NOTE — Assessment & Plan Note (Signed)
#   CLL: [56% OF NUCLEI POSITIVE FOR A 13Q DELETION]; April 2022-PET scan shows significant lymphadenopathy above and below diaphragm; splenomegaly. Discontinued Ibrutinib [hypertensive emergencies] acalabrutinib [leg cramps even with minimum dose].Discontinued zanubrutinib. hypokalemia loss and renal failure/extreme fatigue etc.    # Progression of disease given the rising white count. Again declined any treatment options.   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  #OCT 2024- I sat- 11%- on  gentle iron [iron biglycinate; 28 mg ] 1 pill a day.  Proceed with retacrit today.   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   # DISPOSITION: # retacrit today # 1 unit of blood tomorrow # in 2 weeks- labs- cbc/HOLD tube; retacrit- D-2 possible 1 unit PRBC # follow up in 4 weeks- MD; labs- cbc/ bmp; LDH; possible Retacrit- HOLD tube; D-2- possible 1 unit PRBC- Dr.B

## 2024-03-31 NOTE — Progress Notes (Unsigned)
 Patient is doing well, no new questions or concerns for the doctor today.

## 2024-03-31 NOTE — Progress Notes (Unsigned)
 Critical labs were obtained from [facility] Costa in lab; WBC 136.1 and hemoglobin 6.4 . Readback. Dr B was notified at time of call from lab.

## 2024-04-01 ENCOUNTER — Encounter: Payer: Self-pay | Admitting: Internal Medicine

## 2024-04-01 ENCOUNTER — Encounter: Payer: Medicare Other | Admitting: Family

## 2024-04-01 ENCOUNTER — Other Ambulatory Visit: Payer: Self-pay | Admitting: *Deleted

## 2024-04-01 ENCOUNTER — Inpatient Hospital Stay

## 2024-04-01 DIAGNOSIS — D649 Anemia, unspecified: Secondary | ICD-10-CM

## 2024-04-01 DIAGNOSIS — C9110 Chronic lymphocytic leukemia of B-cell type not having achieved remission: Secondary | ICD-10-CM | POA: Diagnosis not present

## 2024-04-01 LAB — PREPARE RBC (CROSSMATCH)

## 2024-04-01 MED ORDER — ACETAMINOPHEN 325 MG PO TABS
650.0000 mg | ORAL_TABLET | Freq: Once | ORAL | Status: AC
Start: 2024-04-01 — End: 2024-04-01
  Administered 2024-04-01: 650 mg via ORAL
  Filled 2024-04-01: qty 2

## 2024-04-01 MED ORDER — SODIUM CHLORIDE 0.9% IV SOLUTION
250.0000 mL | INTRAVENOUS | Status: DC
  Administered 2024-04-01: 250 mL via INTRAVENOUS
  Filled 2024-04-01: qty 250

## 2024-04-01 MED ORDER — DIPHENHYDRAMINE HCL 25 MG PO CAPS
25.0000 mg | ORAL_CAPSULE | Freq: Once | ORAL | Status: AC
  Administered 2024-04-01: 25 mg via ORAL
  Filled 2024-04-01: qty 1

## 2024-04-01 NOTE — Progress Notes (Unsigned)
 Called the patient's daughter as the results of the blood work elevated uric acid. I left a voicemail to call back to discuss.  GB

## 2024-04-02 LAB — TYPE AND SCREEN
ABO/RH(D): O POS
Antibody Screen: POSITIVE
DAT, IgG: POSITIVE
DAT, complement: POSITIVE
Donor AG Type: NEGATIVE
Unit division: 0

## 2024-04-02 LAB — BPAM RBC
Blood Product Expiration Date: 202505122359
ISSUE DATE / TIME: 202504150920
Unit Type and Rh: 5100

## 2024-04-07 ENCOUNTER — Ambulatory Visit: Admitting: Internal Medicine

## 2024-04-07 ENCOUNTER — Other Ambulatory Visit

## 2024-04-07 ENCOUNTER — Ambulatory Visit

## 2024-04-08 ENCOUNTER — Inpatient Hospital Stay: Admitting: Dietician

## 2024-04-08 ENCOUNTER — Telehealth: Payer: Self-pay | Admitting: *Deleted

## 2024-04-08 NOTE — Progress Notes (Signed)
 Again I tried to reach out to the patient's daughter unable to reach her.  Left a voicemail to call us  back

## 2024-04-08 NOTE — Progress Notes (Signed)
 Nutrition Assessment I called patient at home telephone # and spoke with Lindsay [name] (daughter) as patient was sleeping during call.  Reason for Assessment: MST screen for weight loss.    ASSESSMENT: Patient is an 89 year old female with chronic  lymphocytic leukemia/small lymphocytic lymphoma.  She is currently on surveillance because of intolerance to ibrutinib /acalabrutinib /zanubrutinib   and is followed by Dr. Valentine Gasmen. Daughter relays patient lives with her and she tries really hard to encouraged eating but poor appetite, nausea sometimes but no vomiting, and very picky eater limits success.  Daughter says patient loves sweets, will snack just doesn't like large meals.  Oatmeal, apples, love yogurt, likes, shredded wheat crunched up with yogurts, apples sauce. Fluids:  Ensure clear 12oz, water 12oz.   Anthropometrics: patient down 6# (6%) past 6 months.  Height: 64" Weight: 95.6# UBW: "been small all her life" has been losing BMI: 16.41    NUTRITION DIAGNOSIS: Inadequate PO intake to meet increased nutrient needs, r/t cancer diagnosis  INTERVENTION:  Relayed that nutrition services are wrap around service provided at no charge and encouraged continued communication if experiencing continued weight loss or any nutritional impact symptoms (NIS).  Educated on importance of adequate calorie and protein energy intake  with nutrient dense foods when possible to maintain weight/strength  Discussed sodium restrictions with CHF 1500-2000 mg/day.  Encouraged small frequent feeds with snacking and use of favorites to boost intake of nutrients and fluids. Suggested increase to 2-3 oral nutrition supplement daily   Emailed Nutrition Tip sheet  for  High Protein High Calorie Snacking with contact information provided to preferred email   [email or web address]     MONITORING, EVALUATION, GOAL: weight, PO intake, Nutrition Impact Symptoms, labs Goal is weight maintenance or 1-2#/month weight  gain  Next Visit: remote next month  Lindsay [name], RDN, LDN Registered Dietitian, [facility] Part Time Remote (Usual office hours: Tuesday-Thursday) Cell: [phone]

## 2024-04-09 ENCOUNTER — Ambulatory Visit

## 2024-04-09 ENCOUNTER — Encounter: Payer: Self-pay | Admitting: Internal Medicine

## 2024-04-09 ENCOUNTER — Other Ambulatory Visit

## 2024-04-09 ENCOUNTER — Ambulatory Visit: Admitting: Internal Medicine

## 2024-04-10 ENCOUNTER — Ambulatory Visit

## 2024-04-10 ENCOUNTER — Encounter: Payer: Self-pay | Admitting: Internal Medicine

## 2024-04-10 NOTE — Progress Notes (Signed)
 I again called the patient's daughter at 2 different numbers.  Unable to reach left a voicemail to call us  back in the morning tomorrow.   GB

## 2024-04-11 ENCOUNTER — Encounter: Payer: Self-pay | Admitting: Internal Medicine

## 2024-04-11 NOTE — Telephone Encounter (Signed)
 Discussed  that pt is doing poorly. Life expectancy few weeks- months.  Recommend hospice.  However wants to wait until next visit= on Monday- 4/28 Add APP- appt for 4/28 .

## 2024-04-12 ENCOUNTER — Emergency Department

## 2024-04-12 ENCOUNTER — Other Ambulatory Visit: Payer: Self-pay

## 2024-04-12 ENCOUNTER — Encounter: Payer: Self-pay | Admitting: Medical Oncology

## 2024-04-12 ENCOUNTER — Emergency Department
Admission: EM | Admit: 2024-04-12 | Discharge: 2024-04-12 | Disposition: A | Discharge status: Home / Self Care | Attending: Emergency Medicine | Admitting: Emergency Medicine

## 2024-04-12 DIAGNOSIS — K5900 Constipation, unspecified: Secondary | ICD-10-CM | POA: Diagnosis not present

## 2024-04-12 DIAGNOSIS — R109 Unspecified abdominal pain: Secondary | ICD-10-CM | POA: Diagnosis present

## 2024-04-12 DIAGNOSIS — I509 Heart failure, unspecified: Secondary | ICD-10-CM | POA: Insufficient documentation

## 2024-04-12 DIAGNOSIS — R079 Chest pain, unspecified: Secondary | ICD-10-CM | POA: Diagnosis not present

## 2024-04-12 DIAGNOSIS — M545 Low back pain, unspecified: Secondary | ICD-10-CM | POA: Diagnosis not present

## 2024-04-12 DIAGNOSIS — I110 Hypertensive heart disease with heart failure: Secondary | ICD-10-CM | POA: Insufficient documentation

## 2024-04-12 DIAGNOSIS — D649 Anemia, unspecified: Secondary | ICD-10-CM | POA: Insufficient documentation

## 2024-04-12 LAB — COMPREHENSIVE METABOLIC PANEL WITH GFR
ALT: 9 U/L (ref 0–44)
AST: 16 U/L (ref 15–41)
Albumin: 3.8 g/dL (ref 3.5–5.0)
Alkaline Phosphatase: 101 U/L (ref 38–126)
Anion gap: 8 (ref 5–15)
BUN: 62 mg/dL — ABNORMAL HIGH (ref 8–23)
CO2: 21 mmol/L — ABNORMAL LOW (ref 22–32)
Calcium: 8.8 mg/dL — ABNORMAL LOW (ref 8.9–10.3)
Chloride: 111 mmol/L (ref 98–111)
Creatinine, Ser: 1.75 mg/dL — ABNORMAL HIGH (ref 0.44–1.00)
GFR, Estimated: 28 mL/min — ABNORMAL LOW (ref >60)
Glucose, Bld: 103 mg/dL — ABNORMAL HIGH (ref 70–99)
Potassium: 4.4 mmol/L (ref 3.5–5.1)
Sodium: 140 mmol/L (ref 135–145)
Total Bilirubin: 1.2 mg/dL (ref 0.0–1.2)
Total Protein: 6 g/dL — ABNORMAL LOW (ref 6.5–8.1)

## 2024-04-12 LAB — CBC
HCT: 22.7 % — ABNORMAL LOW (ref 36.0–46.0)
Hemoglobin: 6.7 g/dL — ABNORMAL LOW (ref 12.0–15.0)
MCH: 30.3 pg (ref 26.0–34.0)
MCHC: 29.5 g/dL — ABNORMAL LOW (ref 30.0–36.0)
MCV: 102.7 fL — ABNORMAL HIGH (ref 80.0–100.0)
Platelets: 158 10*3/uL (ref 150–400)
RBC: 2.21 MIL/uL — ABNORMAL LOW (ref 3.87–5.11)
RDW: 24 % — ABNORMAL HIGH (ref 11.5–15.5)
WBC: 119.6 10*3/uL (ref 4.0–10.5)
nRBC: 0 % (ref 0.0–0.2)

## 2024-04-12 LAB — PREPARE RBC (CROSSMATCH)

## 2024-04-12 LAB — LIPASE, BLOOD: Lipase: 35 U/L (ref 11–51)

## 2024-04-12 MED ORDER — SODIUM CHLORIDE 0.9 % IV BOLUS
500.0000 mL | Freq: Once | INTRAVENOUS | Status: AC
  Administered 2024-04-12: 500 mL via INTRAVENOUS

## 2024-04-12 MED ORDER — POLYETHYLENE GLYCOL 3350 17 GM/SCOOP PO POWD: 17.0000 g | Freq: Two times a day (BID) | ORAL | 0 refills | Status: DC | PRN

## 2024-04-12 MED ORDER — SODIUM CHLORIDE 0.9 % IV SOLN
10.0000 mL/h | Freq: Once | INTRAVENOUS | Status: AC
  Administered 2024-04-12: 10 mL/h via INTRAVENOUS

## 2024-04-12 MED ORDER — HYDROCODONE-ACETAMINOPHEN 10-325 MG PO TABS
1.0000 | ORAL_TABLET | Freq: Once | ORAL | Status: AC
  Administered 2024-04-12: 1 via ORAL
  Filled 2024-04-12: qty 1

## 2024-04-12 NOTE — ED Triage Notes (Addendum)
 Pt from home with daughter who reports pt began yesterday complaining of epigastric pain that radiates into back. Pt has CLL and wasn't a candidate for treatment. Is supposed to start hospice care on Monday.

## 2024-04-12 NOTE — ED Notes (Signed)
 Answered call bell. Pt/ family verbalize "hit call button by accident". Deny needs or complaints, sx or questions. Pt eating, alert, NAD, calm, interactive.

## 2024-04-12 NOTE — Discharge Instructions (Addendum)
 Your workup today has not shown any significant finding besides anemia, you have been transfused 1 unit of blood.  Please discuss this with your hematologist/oncologist.  Your CT scan does show constipation, please take MiraLAX up to twice daily as needed for constipation, as this may be contributing to your abdominal discomfort.  Return to the emergency department for any worsening abdominal pain, fever or any other symptom personally concerning to yourself.

## 2024-04-12 NOTE — ED Provider Notes (Signed)
 [facility] Provider Note    Event Date/Time   First MD Initiated Contact with Patient 04/12/24 1042     (approximate)  History   Chief Complaint: Abdominal Pain  HPI  Lindsay [name] is a 89 y.o. female with a past medical history of arthritis, CHF, CLL, fibromyalgia, hypertension, presents to the emergency department for chest pain and back pain.  According to the patient and family patient has advanced CLL was in the process of starting hospice treatment.  They state the patient has been experiencing worsening abdominal pain over the last few days as well as back pain since Friday.  She states a history of abdominal pain which she relates to her CLL, however states the back pain is new.  Patient states a history of back pain but this particular back pain is new.  Son states the patient did not seem clean the floor which he believes could have worsened the back pain.  Patient is describing pain in her lower back mostly in the left side of her lower back.  No urinary symptoms.  No fever.  Per family patient has been found to be recently anemic by her oncologist Dr. Valentine Gasmen and they believe that could be contributing to her symptoms as well.  They have an appointment for blood transfusion but not until this coming week.  Physical Exam   Triage Vital Signs: ED Triage Vitals [04/12/24 1015]  Encounter Vitals Group     BP (!) 137/39     Systolic BP Percentile      Diastolic BP Percentile      Pulse Rate 72     Resp 16     Temp 98.5 F (36.9 C)     Temp Source Oral     SpO2 96 %     Weight 94 lb 12.8 oz (43 kg)     Height 5\' 4"  (1.626 m)     Head Circumference      Peak Flow      Pain Score 9     Pain Loc      Pain Education      Exclude from Growth Chart     Most recent vital signs: Vitals:   04/12/24 1015  BP: (!) 137/39  Pulse: 72  Resp: 16  Temp: 98.5 F (36.9 C)  SpO2: 96%    General: Awake, no distress.  CV:  Good peripheral  perfusion.  Regular rate and rhythm  Resp:  Normal effort.  Equal breath sounds bilaterally.  Abd:  No distention.  Soft, nontender.  No rebound or guarding.  No reaction to abdominal palpation. Other:  Mild tenderness to palpation mostly in the left paraspinal lumbar area.  ED Results / Procedures / Treatments   EKG  EKG viewed and interpreted by myself shows a normal sinus rhythm at 72 bpm with a widened QRS, left axis deviation, largely normal intervals with nonspecific ST changes.  Most consistent with left bundle branch block.  RADIOLOGY  I have reviewed and interpreted CT images of the abdomen.  No significant abnormality besides moderate constipation on my evaluation. Radiology states increasing lymphadenopathy there is also splenomegaly.   MEDICATIONS ORDERED IN ED: Medications  0.9 %  sodium chloride  infusion (has no administration in time range)     IMPRESSION / MDM / ASSESSMENT AND PLAN / ED COURSE  I reviewed the triage vital signs and the nursing notes.  Patient's presentation is most consistent with acute presentation with potential threat  to life or bodily function.  Patient presents to the emergency department for abdominal pain and back pain.  Patient has a history of CLL and states the abdominal pain is somewhat chronic however the back pain comes and goes but has been worse over the last 3 days.  Son does note that the patient steam cleaned her floors which she believes could have exacerbated her pain.  Patient has been more weak and fatigued recently, has been diagnosed with anemia and has a blood transfusion scheduled for this coming week.  Patient's lab work today shows significant anemia with a hemoglobin of 6.7.  We will transfuse 1 unit of packed red blood cells in the emergency department.  Remainder of the lab work is pending.  Given the patient's history comorbidities as well as back and abdominal pain we will obtain a CT scan without contrast given the  patient's history of CKD.  Patient's workup today shows overall reassuring chemistry with chronic renal insufficiency largely unchanged from baseline.  Patient CBC shows continued leukocytosis consistent with the patient's CLL diagnosis.  Her hemoglobin is down to 6.7 we will transfuse 1 unit of packed red blood cells.  Patient and family agreeable to plan.  CT scan shows no significant acute finding patient does appear to be quite constipated will place on MiraLAX.  Patient does have enlarging lymph nodes and splenomegaly which could cause some discomfort for the patient as well.  Patient is entering hospice care for her CLL.  After the transfusion is complete we will discharge patient home with MiraLAX to be used for constipation.  Patient has follow-up with Dr. Valentine Gasmen this week.  CRITICAL CARE Performed by: Ruth Cove   Total critical care time: 30 minutes  Critical care time was exclusive of separately billable procedures and treating other patients.  Critical care was necessary to treat or prevent imminent or life-threatening deterioration.  Critical care was time spent personally by me on the following activities: development of treatment plan with patient and/or surrogate as well as nursing, discussions with consultants, evaluation of patient's response to treatment, examination of patient, obtaining history from patient or surrogate, ordering and performing treatments and interventions, ordering and review of laboratory studies, ordering and review of radiographic studies, pulse oximetry and re-evaluation of patient's condition.  FINAL CLINICAL IMPRESSION(S) / ED DIAGNOSES   Abdominal pain Back pain Constipation   Note:  This document was prepared using Dragon voice recognition software and may include unintentional dictation errors.   Ruth Cove, MD 04/12/24 1444

## 2024-04-13 ENCOUNTER — Telehealth: Payer: Self-pay | Admitting: Oncology

## 2024-04-13 ENCOUNTER — Emergency Department
Admission: EM | Admit: 2024-04-13 | Discharge: 2024-04-13 | Disposition: A | Discharge status: Home / Self Care | Attending: Emergency Medicine | Admitting: Emergency Medicine

## 2024-04-13 ENCOUNTER — Other Ambulatory Visit: Payer: Self-pay

## 2024-04-13 DIAGNOSIS — K5903 Drug induced constipation: Secondary | ICD-10-CM | POA: Diagnosis not present

## 2024-04-13 DIAGNOSIS — M545 Low back pain, unspecified: Secondary | ICD-10-CM | POA: Insufficient documentation

## 2024-04-13 DIAGNOSIS — R109 Unspecified abdominal pain: Secondary | ICD-10-CM | POA: Diagnosis present

## 2024-04-13 LAB — COMPREHENSIVE METABOLIC PANEL WITH GFR
ALT: 8 U/L (ref 0–44)
AST: 19 U/L (ref 15–41)
Albumin: 4 g/dL (ref 3.5–5.0)
Alkaline Phosphatase: 107 U/L (ref 38–126)
Anion gap: 6 (ref 5–15)
BUN: 51 mg/dL — ABNORMAL HIGH (ref 8–23)
CO2: 20 mmol/L — ABNORMAL LOW (ref 22–32)
Calcium: 8.4 mg/dL — ABNORMAL LOW (ref 8.9–10.3)
Chloride: 111 mmol/L (ref 98–111)
Creatinine, Ser: 1.54 mg/dL — ABNORMAL HIGH (ref 0.44–1.00)
GFR, Estimated: 32 mL/min — ABNORMAL LOW (ref >60)
Glucose, Bld: 172 mg/dL — ABNORMAL HIGH (ref 70–99)
Potassium: 4 mmol/L (ref 3.5–5.1)
Sodium: 137 mmol/L (ref 135–145)
Total Bilirubin: 1 mg/dL (ref 0.0–1.2)
Total Protein: 6.4 g/dL — ABNORMAL LOW (ref 6.5–8.1)

## 2024-04-13 LAB — LIPASE, BLOOD: Lipase: 31 U/L (ref 11–51)

## 2024-04-13 LAB — CBC
HCT: 30.2 % — ABNORMAL LOW (ref 36.0–46.0)
Hemoglobin: 9.3 g/dL — ABNORMAL LOW (ref 12.0–15.0)
MCH: 31 pg (ref 26.0–34.0)
MCHC: 30.8 g/dL (ref 30.0–36.0)
MCV: 100.7 fL — ABNORMAL HIGH (ref 80.0–100.0)
Platelets: 152 10*3/uL (ref 150–400)
RBC: 3 MIL/uL — ABNORMAL LOW (ref 3.87–5.11)
RDW: 23.5 % — ABNORMAL HIGH (ref 11.5–15.5)
WBC: 132.7 10*3/uL (ref 4.0–10.5)
nRBC: 0 % (ref 0.0–0.2)

## 2024-04-13 MED ORDER — OXYCODONE-ACETAMINOPHEN 5-325 MG PO TABS
1.0000 | ORAL_TABLET | Freq: Three times a day (TID) | ORAL | 0 refills | Status: DC | PRN
Start: 2024-04-13 — End: 2025-04-13

## 2024-04-13 NOTE — Telephone Encounter (Signed)
 Daughter called that patient has abdominal pain. She was in ER with same complaint and had work up including CT scan. Results were reviewed, consistent with progressing lymphadenopathy and splenomegaly. She received PRBC transfusion. Patient has appt to meet with palliative care provider on 04/14/24.  Patient takes Norco PRN, but daughter feels pain is not well controlled.  Discuss with daughter about facility policy that narcotics can not be prescribed by on call physician during off hours and weekend. If patient experiences severe pain, recommend pt to go to ER for further evaluation.

## 2024-04-13 NOTE — ED Triage Notes (Signed)
 Pt to ED with daughter for bilateral mid back pain since 2 days ago. Pt seen here yesterday for same. Pt has CLL and according to last triage note is supposed to start on hospice care this week. Pt had bloodwork here yesterday and was given 1 unit PRBC. Pt states she also has abdominal pain.

## 2024-04-13 NOTE — ED Notes (Signed)
 Patient and family state they are here for pain control. Dr. Martina Sledge aware.

## 2024-04-13 NOTE — ED Notes (Signed)
 Lab called to let this RN know that pt's WBC was elevated, Dr. Martina Sledge aware, this appears to be normal for this pt, MD states no call to this pt at this time.

## 2024-04-13 NOTE — ED Provider Notes (Signed)
 [facility] Provider Note    Event Date/Time   First MD Initiated Contact with Patient 04/13/24 1127     (approximate)   History   Back Pain and Abdominal Pain   HPI  Lindsay [name] is a 89 y.o. female with history of CLL, entering hospice with palliative care tomorrow who presents because of difficulty sleeping last night secondary to pain.  Primarily abdominal pain but also some back pain.  Family feels strongly that the Vicodin that she has taken for some time is not sufficient for her pain.  Notably she also has some constipation on her CT scan from yesterday, unclear whether this is causing her pain     Physical Exam   Triage Vital Signs: ED Triage Vitals  Encounter Vitals Group     BP 04/13/24 1120 (!) 156/45     Systolic BP Percentile --      Diastolic BP Percentile --      Pulse Rate 04/13/24 1120 69     Resp 04/13/24 1120 20     Temp 04/13/24 1120 97.9 F (36.6 C)     Temp Source 04/13/24 1120 Oral     SpO2 04/13/24 1120 100 %     Weight 04/13/24 1122 43 kg (94 lb 12.8 oz)     Height 04/13/24 1122 1.626 m (5\' 4" )     Head Circumference --      Peak Flow --      Pain Score 04/13/24 1119 10     Pain Loc --      Pain Education --      Exclude from Growth Chart --     Most recent vital signs: Vitals:   04/13/24 1120 04/13/24 1135  BP: (!) 156/45 (!) 161/42  Pulse: 69 67  Resp: 20 18  Temp: 97.9 F (36.6 C)   SpO2: 100% 99%     General: Awake, no distress.  CV:  Good peripheral perfusion.  Resp:  Normal effort.  Abd:  No distention.  Other:     ED Results / Procedures / Treatments   Labs (all labs ordered are listed, but only abnormal results are displayed) Labs Reviewed  LIPASE, BLOOD  COMPREHENSIVE METABOLIC PANEL WITH GFR  CBC  URINALYSIS, ROUTINE W REFLEX MICROSCOPIC     EKG     RADIOLOGY Reviewed CT scan from yesterday    PROCEDURES:  Critical Care performed:   Procedures   MEDICATIONS  ORDERED IN ED: Medications - No data to display   IMPRESSION / MDM / ASSESSMENT AND PLAN / ED COURSE  I reviewed the triage vital signs and the nursing notes. Patient's presentation is most consistent with exacerbation of chronic illness.  Patient presents with abdominal and back pain as detailed above, had CT scan and workup yesterday as well as received 1 unit PRBCs then.  Family is here primarily for improvement with pain control, they feel her Vicodin is not controlling her pain  Discussed with them the need for twice daily MiraLAX for constipation, we will certainly increase her pain medication to Percocet which they agree with.  They have excellent outpatient follow-up        FINAL CLINICAL IMPRESSION(S) / ED DIAGNOSES   Final diagnoses:  Drug-induced constipation  Bilateral low back pain, unspecified chronicity, unspecified whether sciatica present     Rx / DC Orders   ED Discharge Orders          Ordered    oxyCODONE -acetaminophen  (PERCOCET) 5-325  MG tablet  Every 8 hours PRN        04/13/24 1155             Note:  This document was prepared using Dragon voice recognition software and may include unintentional dictation errors.   Bryson Carbine, MD 04/13/24 1158

## 2024-04-14 ENCOUNTER — Inpatient Hospital Stay

## 2024-04-14 ENCOUNTER — Encounter: Payer: Self-pay | Admitting: Hospice and Palliative Medicine

## 2024-04-14 ENCOUNTER — Inpatient Hospital Stay (HOSPITAL_BASED_OUTPATIENT_CLINIC_OR_DEPARTMENT_OTHER): Admitting: Hospice and Palliative Medicine

## 2024-04-14 VITALS — BP 148/47 | HR 61 | Temp 97.8°F | Resp 18 | Wt 95.2 lb

## 2024-04-14 DIAGNOSIS — C9110 Chronic lymphocytic leukemia of B-cell type not having achieved remission: Secondary | ICD-10-CM

## 2024-04-14 DIAGNOSIS — Z515 Encounter for palliative care: Secondary | ICD-10-CM | POA: Diagnosis not present

## 2024-04-14 DIAGNOSIS — G893 Neoplasm related pain (acute) (chronic): Secondary | ICD-10-CM

## 2024-04-14 LAB — TYPE AND SCREEN
ABO/RH(D): O POS
Antibody Screen: POSITIVE
DAT, IgG: POSITIVE
DAT, complement: POSITIVE
Donor AG Type: NEGATIVE
Unit division: 0
Unit division: 0
Unit division: 0

## 2024-04-14 LAB — BPAM RBC
Blood Product Expiration Date: 202505072359
Blood Product Expiration Date: 202505072359
Blood Product Expiration Date: 202505202359
ISSUE DATE / TIME: 202504261846
Unit Type and Rh: 5100
Unit Type and Rh: 5100
Unit Type and Rh: 5100

## 2024-04-14 NOTE — Progress Notes (Signed)
 Palliative Medicine [facility] at [facility] [phone]) [phone] Fax:([phone]   Name: Lindsay [name] Date: 04/14/2024 MRN: [id number]  DOB: [birth year]  Patient Care Team: Lyle San, MD as PCP - General (Family Medicine) Gwyn Leos, MD as Consulting Physician (Internal Medicine)    REASON FOR CONSULTATION: Lindsay [name] is a 89 y.o. female with multiple medical problems including CLL currently on surveillance because of intolerance to previous treatments, transfusion dependence, history of CHF and CKD.  Patient has had rising white counts.  Was referred to palliative care to address goals and manage ongoing symptoms.  SOCIAL HISTORY:     reports that she has never smoked. She has never used smokeless tobacco. She reports that she does not drink alcohol and does not use drugs.  ADVANCE DIRECTIVES:    CODE STATUS: DNR/DNI (DNR order signed on/28/25)  PAST MEDICAL HISTORY: Past Medical History:  Diagnosis Date   Arthritis    RHEUMATOID   CHF (congestive heart failure) (HCC)    CLL (chronic lymphocytic leukemia) (HCC)    Edema    MILD ANKLE OCCAS   Fibromyalgia    GERD (gastroesophageal reflux disease)    Hypertension    Kidney stones    RLS (restless legs syndrome)     PAST SURGICAL HISTORY:  Past Surgical History:  Procedure Laterality Date   ABDOMINAL HYSTERECTOMY     CATARACT EXTRACTION W/PHACO Left 02/07/2016   Procedure: CATARACT EXTRACTION PHACO AND INTRAOCULAR LENS PLACEMENT (IOC);  Surgeon: Steven Dingeldein, MD;  Location: [facility] ORS;  Service: Ophthalmology;  Laterality: Left;  US  01:33 AP% 24.7 CDE 39.57 fluid pack lot # [id number]   EXTRACORPOREAL SHOCK WAVE LITHOTRIPSY Right 07/03/2019   Procedure: EXTRACORPOREAL SHOCK WAVE LITHOTRIPSY (ESWL);  Surgeon: Rea Cambridge, MD;  Location: [facility] ORS;  Service: Urology;  Laterality: Right;   EYE SURGERY     HIP ARTHROPLASTY Left 04/20/2020   Procedure:  ARTHROPLASTY BIPOLAR HIP (HEMIARTHROPLASTY);  Surgeon: Molli Angelucci, MD;  Location: [facility] ORS;  Service: Orthopedics;  Laterality: Left;   LITHOTRIPSY     TONSILLECTOMY      HEMATOLOGY/ONCOLOGY HISTORY:  Oncology History Overview Note  # LYMPHOCYTOSIS [25,K]/ANEMIA 9-10]; platelets-N; IgVH Somatic Hypermutation was not detected.56% OF NUCLEI POSITIVE FOR A 13Q DELETION; The phenotype is most consistent with a diagnosis of 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 Low level hypermetabolism consistent with CLL.2. Splenomegaly but no focal splenic lesions.  # April 2022- 13 Jamelle Mcalpine; Claudean Crumbly- UNMUTATED  # MID April 2022- IBRUTINIB  280 mg x3 days [STOPPED sec ER/hypertensive emergency- 191/129]  # MAY 26th, 2022- start acalabrutinib  100 mg once a day [reduced dose]; intermittent; discontinued  September mid 2022. [Muscle cramps.]  # MAY 1st week- 2024- Zanubrutinib  80 mg; stopped after 1-2 weeks- sec to acute renal failure hyperkalemia/extreme fatigue etc.  Patient declined further therapies.       CLL (chronic lymphocytic leukemia) (HCC)  02/14/2021 Initial Diagnosis   CLL (chronic lymphocytic leukemia) (HCC)   04/07/2021 Cancer Staging   Staging form: Chronic Lymphocytic Leukemia / Small Lymphocytic Lymphoma, AJCC 8th Edition - Clinical: Modified Rai Stage III (Modified Rai risk: High, Binet: Stage C) - Signed by Gwyn Leos, MD on 04/07/2021 Stage prefix: Initial diagnosis   12/16/2021 - 12/16/2021 Chemotherapy   Patient is on Treatment Plan : CLL - Rituximab q 4 weeks  ALLERGIES:  is allergic to gabapentin and demerol [meperidine].  MEDICATIONS:  Current Outpatient Medications  Medication Sig Dispense Refill   amitriptyline  (ELAVIL ) 10 MG tablet Take 20 mg by mouth at bedtime.     cyanocobalamin  (,VITAMIN B-12,) 1000 MCG/ML injection  Inject into the muscle every 14 (fourteen) days. Next injection 4/16     cyanocobalamin  (VITAMIN B12) 1000 MCG tablet Take 1,000 mcg by mouth daily.     DULoxetine  (CYMBALTA ) 20 MG capsule Take 20 mg by mouth daily.     Ferrous Sulfate 28 MG TABS Take 28 mg by mouth daily.     furosemide  (LASIX ) 20 MG tablet Take 2 tablets (40 mg total) by mouth daily. 60 tablet 5   MELATONIN GUMMIES PO Take 3 mg by mouth at bedtime as needed.     oxyCODONE -acetaminophen  (PERCOCET) 5-325 MG tablet Take 1-2 tablets by mouth every 8 (eight) hours as needed for severe pain (pain score 7-10). 20 tablet 0   polyethylene glycol powder (GLYCOLAX/MIRALAX) 17 GM/SCOOP powder Take 17 g by mouth 2 (two) times daily as needed for moderate constipation. 255 g 0   No current facility-administered medications for this visit.    VITAL SIGNS: BP (!) 148/47 (BP Location: Left Arm, Patient Position: Sitting)   Pulse 61   Temp 97.8 F (36.6 C) (Tympanic)   Resp 18   Wt 95 lb 3.2 oz (43.2 kg)   BMI 16.34 kg/m  Filed Weights   04/14/24 1045  Weight: 95 lb 3.2 oz (43.2 kg)    Estimated body mass index is 16.34 kg/m as calculated from the following:   Height as of 04/13/24: 5\' 4"  (1.626 m).   Weight as of this encounter: 95 lb 3.2 oz (43.2 kg).  LABS: CBC:    Component Value Date/Time   WBC 132.7 (HH) 04/13/2024 1123   HGB 9.3 (L) 04/13/2024 1123   HGB 6.4 (LL) 03/31/2024 1530   HGB 14.1 03/06/2014 2049   HCT 30.2 (L) 04/13/2024 1123   HCT 42.9 03/06/2014 2049   PLT 152 04/13/2024 1123   PLT 173 03/31/2024 1530   PLT 240 03/06/2014 2049   MCV 100.7 (H) 04/13/2024 1123   MCV 88 03/06/2014 2049   NEUTROABS 5.2 03/31/2024 1530   NEUTROABS 7.3 (H) 03/06/2014 2049   LYMPHSABS 122.7 (H) 03/31/2024 1530   LYMPHSABS 6.6 (H) 03/06/2014 2049   MONOABS 5.8 (H) 03/31/2024 1530   MONOABS 1.3 (H) 03/06/2014 2049   EOSABS 1.8 (H) 03/31/2024 1530   EOSABS 0.4 03/06/2014 2049   BASOSABS 0.1 03/31/2024 1530   BASOSABS 0.1  03/06/2014 2049   Comprehensive Metabolic Panel:    Component Value Date/Time   NA 137 04/13/2024 1123   NA 138 09/04/2023 1154   NA 138 03/06/2014 2049   K 4.0 04/13/2024 1123   K 3.6 03/06/2014 2049   CL 111 04/13/2024 1123   CL 107 03/06/2014 2049   CO2 20 (L) 04/13/2024 1123   CO2 29 03/06/2014 2049   BUN 51 (H) 04/13/2024 1123   BUN 58 (H) 09/04/2023 1154   BUN 14 03/06/2014 2049   CREATININE 1.54 (H) 04/13/2024 1123   CREATININE 1.78 (H) 03/31/2024 1530   CREATININE 0.75 03/06/2014 2049   GLUCOSE 172 (H) 04/13/2024 1123   GLUCOSE 103 (H) 03/06/2014 2049   CALCIUM  8.4 (L) 04/13/2024 1123   CALCIUM  8.9 03/06/2014 2049   AST 19 04/13/2024 1123   AST 15 09/25/2023 0855   ALT 8 04/13/2024 1123   ALT  8 09/25/2023 0855   ALKPHOS 107 04/13/2024 1123   BILITOT 1.0 04/13/2024 1123   BILITOT 1.1 09/25/2023 0855   PROT 6.4 (L) 04/13/2024 1123   ALBUMIN 4.0 04/13/2024 1123    RADIOGRAPHIC STUDIES: CT ABDOMEN PELVIS WO CONTRAST Result Date: 04/12/2024 CLINICAL DATA:  Epigastric abdominal pain radiating into back and history of CLL/SLL. EXAM: CT ABDOMEN AND PELVIS WITHOUT CONTRAST TECHNIQUE: Multidetector CT imaging of the abdomen and pelvis was performed following the standard protocol without IV contrast. RADIATION DOSE REDUCTION: This exam was performed according to the departmental dose-optimization program which includes automated exposure control, adjustment of the mA and/or kV according to patient size and/or use of iterative reconstruction technique. COMPARISON:  PET scan on 03/22/2021 FINDINGS: Lower chest: Scarring and atelectasis at both lung bases. Trace bilateral pleural fluid, left greater than right. Hepatobiliary: Unremarkable unenhanced appearance of the liver. The gallbladder contains several small calcified gallstones. No evidence of biliary ductal dilatation. Pancreas: Unremarkable. No pancreatic ductal dilatation or surrounding inflammatory changes. Spleen: Increased  splenomegaly since the prior PET scan with estimated volume of 709 mL. Adrenals/Urinary Tract: Adrenal glands are unremarkable. Kidneys are normal, without renal calculi, focal lesion, or hydronephrosis. Bladder is unremarkable. Stomach/Bowel: Moderate fecal material in the colon. Diffuse colonic diverticulosis. No evidence of bowel obstruction or free intraperitoneal air. Vascular/Lymphatic: Atherosclerosis of the abdominal aorta and iliac arteries without aneurysm. Degree chronic lymphadenopathy in the retroperitoneum and bilateral iliac chains and inguinal regions is increased in prominence compared to the prior PET scan. At the level of the distal aorta previous estimated maximum transverse diameter diffuse retroperitoneal lymph node mass was approximately 8.5 cm and is now 9.5 cm. Bilateral iliac lymphadenopathy shows significant increase in size, especially on the right with dominant external iliac node now measuring up to 4.8 x 7.6 cm, previously roughly 3.2 cm in short axis. Reproductive: Status post hysterectomy. No adnexal masses. Other: No abdominal wall hernia or abnormality. No abdominopelvic ascites. Musculoskeletal: Degenerative disc disease throughout the visualized spine. Mild loss of height of the T12 vertebral body at the level of the superior endplate. No visualized bony lesions. IMPRESSION: 1. Increased prominence of retroperitoneal, bilateral iliac and inguinal lymphadenopathy compared to the prior PET scan on 03/22/2021. Findings are consistent with worsening/transformation of known CLL/SLL. 2. Increased splenomegaly since the prior PET scan with estimated volume of 709 mL. 3. Cholelithiasis. 4. Diffuse colonic diverticulosis. 5. Trace bilateral pleural fluid, left greater than right. 6. Aortic atherosclerosis. Electronically Signed   By: Erica Hau M.D.   On: 04/12/2024 11:50    PERFORMANCE STATUS (ECOG) : 1 - Symptomatic but completely ambulatory  Review of Systems Unless  otherwise noted, a complete review of systems is negative.  Physical Exam General: NAD Pulmonary: Unlabored Extremities: no edema, no joint deformities Skin: no rashes Neurological: Weakness but otherwise nonfocal  IMPRESSION: Patient presented to the emergency department twice over the weekend with severe back pain.  Was found to have constipation.  CT of the abdomen and pelvis showed increased prominence of retroperitoneal, bilateral iliac and inguinal lymphadenopathy suggestive of worsening CLL.  Patient received blood transfusion while in the emergency department.  She presents to clinic today for follow-up and discussion of goals.  Patient states that she is feeling significantly improved posttransfusion.  Pain is also improved after initiation of Percocet, which patient is taking sparingly.  Daughter says that patient was able to steam cleaning the floors in their home.  Both patient and daughter are aware that CLL appears to  be worsening.  Patient is not interested in treatment options for that.  However, she does wish to continue transfusions as she generally feels much improved afterwards.  We did discuss the possible future option of hospice but transfusions would likely be a barrier for hospice enrollment at this time.  Both patient and daughter are interested in palliative care following at home and I will send that referral.  Patient states that she has lived a good life and is ready for death whenever that may come.  Her primary goal is comfort and quality of life.  She is not interested in resuscitation nor would she want her life prolonged artificially on machines.  I signed a DNR order for her to take home today.  PLAN: -Continue current scope of treatment - Continue Percocet as needed for pain - Liberalize bowel regimen to include daily senna - Referral to community palliative care - DNR/DNI - Follow-up 1 to 2 weeks  Case and plan discussed with Dr.  Valentine Gasmen  Patient expressed understanding and was in agreement with this plan. She also understands that She can call the clinic at any time with any questions, concerns, or complaints.     Time Total: 20 minutes  Visit consisted of counseling and education dealing with the complex and emotionally intense issues of symptom management and palliative care in the setting of serious and potentially life-threatening illness.Greater than 50%  of this time was spent counseling and coordinating care related to the above assessment and plan.  Signed by: Gerilyn Kobus, PhD, NP-C

## 2024-04-14 NOTE — Progress Notes (Signed)
 Patient reports low back pain 10/10 today.  Did take Oxycodone  this morning 3 hours ago.

## 2024-04-15 ENCOUNTER — Inpatient Hospital Stay

## 2024-04-16 ENCOUNTER — Telehealth: Payer: Self-pay | Admitting: *Deleted

## 2024-04-16 ENCOUNTER — Other Ambulatory Visit: Payer: Self-pay | Admitting: Hospice and Palliative Medicine

## 2024-04-16 MED ORDER — OXYCODONE-ACETAMINOPHEN 5-325 MG PO TABS
1.0000 | ORAL_TABLET | Freq: Four times a day (QID) | ORAL | 0 refills | Status: DC | PRN
Start: 2024-04-16 — End: 2024-04-23

## 2024-04-16 NOTE — Telephone Encounter (Signed)
 I sent a message to [facility] what he wanted to do and he put the orders in and the oxycodone  was sent to [facility] pharmacy and then I called her daughter to let her know that it has been sent to the pharmacy that they needed daughter knows that because I just called her.

## 2024-04-18 ENCOUNTER — Other Ambulatory Visit: Payer: Self-pay | Admitting: Family

## 2024-04-23 ENCOUNTER — Other Ambulatory Visit: Payer: Self-pay | Admitting: Hospice and Palliative Medicine

## 2024-04-28 ENCOUNTER — Other Ambulatory Visit: Payer: Self-pay | Admitting: *Deleted

## 2024-04-28 ENCOUNTER — Inpatient Hospital Stay

## 2024-04-28 ENCOUNTER — Inpatient Hospital Stay (HOSPITAL_BASED_OUTPATIENT_CLINIC_OR_DEPARTMENT_OTHER): Admitting: Internal Medicine

## 2024-04-28 ENCOUNTER — Inpatient Hospital Stay: Attending: Nurse Practitioner

## 2024-04-28 ENCOUNTER — Encounter: Payer: Self-pay | Admitting: Internal Medicine

## 2024-04-28 ENCOUNTER — Inpatient Hospital Stay (HOSPITAL_BASED_OUTPATIENT_CLINIC_OR_DEPARTMENT_OTHER): Admitting: Hospice and Palliative Medicine

## 2024-04-28 VITALS — BP 161/35 | HR 66 | Temp 98.4°F | Resp 18 | Ht 64.0 in | Wt 91.7 lb

## 2024-04-28 DIAGNOSIS — C9110 Chronic lymphocytic leukemia of B-cell type not having achieved remission: Secondary | ICD-10-CM

## 2024-04-28 DIAGNOSIS — N183 Chronic kidney disease, stage 3 unspecified: Secondary | ICD-10-CM | POA: Diagnosis not present

## 2024-04-28 DIAGNOSIS — Z79899 Other long term (current) drug therapy: Secondary | ICD-10-CM | POA: Insufficient documentation

## 2024-04-28 DIAGNOSIS — I130 Hypertensive heart and chronic kidney disease with heart failure and stage 1 through stage 4 chronic kidney disease, or unspecified chronic kidney disease: Secondary | ICD-10-CM | POA: Diagnosis not present

## 2024-04-28 DIAGNOSIS — E611 Iron deficiency: Secondary | ICD-10-CM

## 2024-04-28 DIAGNOSIS — Z515 Encounter for palliative care: Secondary | ICD-10-CM | POA: Diagnosis not present

## 2024-04-28 DIAGNOSIS — E860 Dehydration: Secondary | ICD-10-CM

## 2024-04-28 DIAGNOSIS — D649 Anemia, unspecified: Secondary | ICD-10-CM

## 2024-04-28 DIAGNOSIS — Z66 Do not resuscitate: Secondary | ICD-10-CM | POA: Insufficient documentation

## 2024-04-28 LAB — CBC WITH DIFFERENTIAL (CANCER CENTER ONLY)
Abs Immature Granulocytes: 0.41 10*3/uL — ABNORMAL HIGH (ref 0.00–0.07)
Basophils Absolute: 0.1 10*3/uL (ref 0.0–0.1)
Basophils Relative: 0 %
Eosinophils Absolute: 1.9 10*3/uL — ABNORMAL HIGH (ref 0.0–0.5)
Eosinophils Relative: 2 %
HCT: 23.9 % — ABNORMAL LOW (ref 36.0–46.0)
Hemoglobin: 7.3 g/dL — ABNORMAL LOW (ref 12.0–15.0)
Immature Granulocytes: 0 %
Lymphocytes Relative: 91 %
Lymphs Abs: 102.3 10*3/uL — ABNORMAL HIGH (ref 0.7–4.0)
MCH: 31.5 pg (ref 26.0–34.0)
MCHC: 30.5 g/dL (ref 30.0–36.0)
MCV: 103 fL — ABNORMAL HIGH (ref 80.0–100.0)
Monocytes Absolute: 3.9 10*3/uL — ABNORMAL HIGH (ref 0.1–1.0)
Monocytes Relative: 4 %
Neutro Abs: 3.6 10*3/uL (ref 1.7–7.7)
Neutrophils Relative %: 3 %
Platelet Count: 178 10*3/uL (ref 150–400)
RBC: 2.32 MIL/uL — ABNORMAL LOW (ref 3.87–5.11)
RDW: 21.3 % — ABNORMAL HIGH (ref 11.5–15.5)
Smear Review: NORMAL
WBC Count: 112.2 10*3/uL (ref 4.0–10.5)
nRBC: 0 % (ref 0.0–0.2)

## 2024-04-28 LAB — BASIC METABOLIC PANEL - CANCER CENTER ONLY
Anion gap: 9 (ref 5–15)
BUN: 65 mg/dL — ABNORMAL HIGH (ref 8–23)
CO2: 20 mmol/L — ABNORMAL LOW (ref 22–32)
Calcium: 8.3 mg/dL — ABNORMAL LOW (ref 8.9–10.3)
Chloride: 108 mmol/L (ref 98–111)
Creatinine: 1.9 mg/dL — ABNORMAL HIGH (ref 0.44–1.00)
GFR, Estimated: 25 mL/min — ABNORMAL LOW (ref >60)
Glucose, Bld: 122 mg/dL — ABNORMAL HIGH (ref 70–99)
Potassium: 4.8 mmol/L (ref 3.5–5.1)
Sodium: 137 mmol/L (ref 135–145)

## 2024-04-28 LAB — LACTATE DEHYDROGENASE: LDH: 158 U/L (ref 98–192)

## 2024-04-28 MED ORDER — EPOETIN ALFA-EPBX 20000 UNIT/ML IJ SOLN
20000.0000 [IU] | Freq: Once | INTRAMUSCULAR | Status: AC
  Administered 2024-04-28: 20000 [IU] via SUBCUTANEOUS
  Filled 2024-04-28: qty 1

## 2024-04-28 NOTE — Progress Notes (Signed)
 She has some red patches on her left arm since last visit. Daughter states it's coming from the leukemia.  C/o pain in her abdomen and back, 9/10.  No appetite, 2 ensure per day.

## 2024-04-28 NOTE — Assessment & Plan Note (Addendum)
#   CLL: [56% OF NUCLEI POSITIVE FOR A 13Q DELETION]; April 2022-PET scan shows significant lymphadenopathy above and below diaphragm; splenomegaly. Discontinued Ibrutinib  [hypertensive emergencies] acalabrutinib  [leg cramps even with minimum dose].Discontinued zanubrutinib . hypokalemia loss and renal failure/extreme fatigue etc.    # Progression of disease given the rising white count. Again declined any treatment options.   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  #OCT 2024- I sat- 11%- on  gentle iron [iron biglycinate; 28 mg ] 1 pill a day.  Proceed with retacrit  today. WBC 112; HB -7.3; PLATELETS- PENDING.   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   #  s/p pallaitive care-as per patient wishes continue follow-up hospice.  Continue supportive care.  Discussed with Dr. Annelle Kiel.  # ACP- DNR   # DISPOSITION: # retacrit  today # 1 unit of blood tomorrow # in 2 weeks- labs- cbc/HOLD tube; retacrit - D-2 possible 1 unit PRBC # follow up in 4 weeks- MD; labs- cbc/ bmp; LDH; possible Retacrit - HOLD tube; D-2- possible 1 unit PRBC- Dr.B

## 2024-04-28 NOTE — Progress Notes (Signed)
  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 Low level hypermetabolism consistent with CLL.2. Splenomegaly but no focal splenic lesions.  # April 2022- 13 Jamelle Mcalpine; Claudean Crumbly- UNMUTATED  # MID April 2022- IBRUTINIB  280 mg x3 days [STOPPED sec ER/hypertensive emergency- 191/129]  # MAY 26th, 2022- start acalabrutinib  100 mg once a day [reduced dose]; intermittent; discontinued  September mid 2022. [Muscle cramps.]  # MAY 1st week- 2024- Zanubrutinib  80 mg; stopped after 1-2 weeks- sec to acute renal failure hyperkalemia/extreme fatigue etc.  Patient declined further therapies.       CLL (chronic lymphocytic leukemia) (HCC)  02/14/2021 Initial Diagnosis   CLL (chronic lymphocytic leukemia) (HCC)   04/07/2021 Cancer Staging   Staging form: Chronic Lymphocytic Leukemia / Small Lymphocytic Lymphoma, AJCC 8th Edition - Clinical: Modified Rai Stage III (Modified Rai risk: High, Binet: Stage C) - Signed by Gwyn Leos, MD on 04/07/2021 Stage prefix: Initial diagnosis   12/16/2021 - 12/16/2021 Chemotherapy   Patient is on Treatment Plan : CLL - Rituximab q 4 weeks      HISTORY OF PRESENTING ILLNESS: Frail-appearing Caucasian female patient/accompanied by daughter.  Ambulating independently.  Audie Leander 89 y.o.  female symptomatic CLL currently on on surveillance because of intolerance to  ibrutinib /acalabrutinib /zanubrutinib  is here for follow-up-currently on best supportive care with blood transfusions.  In the interim-evaluated in the emergency room for abdominal pain.  CT scan showed progression of disease.  Patient fentanyl  oxycodone  as needed.  Complains of poor appetite.  Positive for weight loss.   No appetite, no supplement drink.  Patient s/p blood transfusion.   Also noticed significant increasing size of the neck lymph nodes/underarm lymph nodes.  Review of Systems  Constitutional:  Positive for malaise/fatigue and weight loss. Negative for chills, diaphoresis and fever.  HENT:  Negative for nosebleeds and sore throat.   Eyes:  Negative for double vision.  Respiratory:  Negative for cough, hemoptysis, sputum production, shortness of breath and wheezing.   Cardiovascular:  Negative for chest pain, palpitations, orthopnea and leg swelling.  Gastrointestinal:  Negative for abdominal pain, constipation, diarrhea, heartburn, melena, nausea and vomiting.  Genitourinary:  Negative for dysuria, frequency and urgency.  Musculoskeletal:  Positive for joint pain. Negative for back pain.  Skin: Negative.  Negative for itching and rash.  Neurological:  Negative for dizziness, tingling, focal weakness, weakness and headaches.  Endo/Heme/Allergies:  Bruises/bleeds easily.  Psychiatric/Behavioral:  Negative for depression. The patient is not nervous/anxious and does not have insomnia.      MEDICAL HISTORY:  Past Medical History:  Diagnosis Date   Arthritis    RHEUMATOID   CHF (congestive heart failure) (HCC)    CLL (chronic lymphocytic leukemia) (HCC)    Edema    MILD ANKLE OCCAS   Fibromyalgia    GERD (gastroesophageal reflux disease)    Hypertension    Kidney stones    RLS (restless legs syndrome)     SURGICAL HISTORY: Past Surgical History:  Procedure Laterality Date   ABDOMINAL HYSTERECTOMY  CATARACT EXTRACTION W/PHACO Left 02/07/2016   Procedure: CATARACT  EXTRACTION PHACO AND INTRAOCULAR LENS PLACEMENT (IOC);  Surgeon: Steven Dingeldein, MD;  Location: [facility] ORS;  Service: Ophthalmology;  Laterality: Left;  US  01:33 AP% 24.7 CDE 39.57 fluid pack lot # [id number]   EXTRACORPOREAL SHOCK WAVE LITHOTRIPSY Right 07/03/2019   Procedure: EXTRACORPOREAL SHOCK WAVE LITHOTRIPSY (ESWL);  Surgeon: Rea Cambridge, MD;  Location: [facility] OR[facility];  Service: Urology;  Laterality: Right;   EYE SURGERY     HIP ARTHROPLASTY Left 04/20/2020   Procedure: ARTHROPLASTY BIPOLAR HIP (HEMIARTHROPLASTY);  Surgeon: Molli Angelucci, MD;  Location: [facility] ORS;  Service: Orthopedics;  Laterality: Left;   LITHOTRIPSY     TONSILLECTOMY      SOCIAL HISTORY: Social History   Socioeconomic History   Marital status: Widowed    Spouse name: Not on file   Number of children: Not on file   Years of education: Not on file   Highest education level: Not on file  Occupational History   Not on file  Tobacco Use   Smoking status: Never   Smokeless tobacco: Never  Substance and Sexual Activity   Alcohol use: No   Drug use: Never   Sexual activity: Not Currently  Other Topics Concern   Not on file  Social History Narrative   Lives with daughter, in [place]. 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    Social Drivers of Corporate investment banker Strain: Low Risk  (12/24/2023)   Received from [facility]   Overall Financial Resource Strain (CARDIA)    Difficulty of Paying Living Expenses: Not hard at all  Food Insecurity: No Food Insecurity (12/24/2023)   Received from [facility]   Hunger Vital Sign    Ran Out of Food in the Last Year: Never true    Worried About Running Out of Food in the Last Year: Never true  Transportation Needs: No Transportation Needs (12/24/2023)   Received from [facility]   PRAPARE - Transportation    Lack of Transportation (Non-Medical): No    In the past 12 months, has lack of transportation  kept you from medical appointments or from getting medications?: No  Physical Activity: Not on file  Stress: Not on file  Social Connections: Not on file  Intimate Partner Violence: Not At Risk (09/19/2023)   Humiliation, Afraid, Rape, and Kick questionnaire    Fear of Current or Ex-Partner: No    Emotionally Abused: No    Physically Abused: No    Sexually Abused: No    FAMILY HISTORY: Family History  Problem Relation Age of Onset   Cancer Mother        uterine cancer    ALLERGIES:  is allergic to gabapentin and demerol [meperidine].  MEDICATIONS:  Current Outpatient Medications  Medication Sig Dispense Refill   amitriptyline  (ELAVIL ) 10 MG tablet Take 20 mg by mouth at bedtime.     cyanocobalamin  (,VITAMIN B-12,) 1000 MCG/ML injection Inject into the muscle every 14 (fourteen) days. Next injection 4/16     cyanocobalamin  (VITAMIN B12) 1000 MCG tablet Take 1,000 mcg by mouth daily.     Ferrous Sulfate 28 MG TABS Take 28 mg by mouth daily.     furosemide  (LASIX ) 20 MG tablet TAKE TWO TABLETS (40 MG TOTAL) BY MOUTH DAILY. 60 tablet 5   MELATONIN GUMMIES PO Take 3 mg by mouth at bedtime as needed.     oxyCODONE -acetaminophen  (PERCOCET/ROXICET) 5-325 MG tablet Take 1-2  tablets by mouth every 4 (four) hours as needed for severe pain (pain score 7-10). 60 tablet 0   polyethylene glycol powder (GLYCOLAX /MIRALAX ) 17 GM/SCOOP powder Take 17 g by mouth 2 (two) times daily as needed for moderate constipation. 255 g 0   DULoxetine  (CYMBALTA ) 20 MG capsule Take 20 mg by mouth daily.     No current facility-administered medications for this visit.    PHYSICAL EXAMINATION: ECOG PERFORMANCE STATUS: 0 - Asymptomatic  Vitals:   04/28/24 1437 04/28/24 1509  BP: (!) 162/33 (!) 161/35  Pulse: 66   Resp: 18   Temp: 98.4 F (36.9 C)   SpO2: 100%      Filed Weights   04/28/24 1437  Weight: 91 lb 11.2 oz (41.6 kg)   Bulky bilateral neck adenopathy; underarm adenopathy.  Physical  Exam HENT:     Head: Normocephalic and atraumatic.     Mouth/Throat:     Pharynx: No oropharyngeal exudate.  Eyes:     Pupils: Pupils are equal, round, and reactive to light.  Cardiovascular:     Rate and Rhythm: Normal rate and regular rhythm.  Pulmonary:     Effort: Pulmonary effort is normal. No respiratory distress.     Breath sounds: Normal breath sounds. No wheezing.  Abdominal:     General: Bowel sounds are normal. There is no distension.     Palpations: Abdomen is soft. There is no mass.     Tenderness: There is no abdominal tenderness. There is no guarding or rebound.  Musculoskeletal:        General: No tenderness. Normal range of motion.     Cervical back: Normal range of motion and neck supple.  Skin:    General: Skin is warm.     Comments: Chronic multiple bruises noted.  Neurological:     Mental Status: She is alert and oriented to person, place, and time.  Psychiatric:        Mood and Affect: Affect normal.      LABORATORY DATA:  I have reviewed the data as listed Lab Results  Component Value Date   WBC 112.2 (HH) 04/28/2024   HGB 7.3 (L) 04/28/2024   HCT 23.9 (L) 04/28/2024   MCV 103.0 (H) 04/28/2024   PLT 178 04/28/2024   Recent Labs    09/25/23 0855 10/09/23 0954 04/12/24 1018 04/13/24 1123 04/28/24 1446  NA 136   < > 140 137 137  K 5.1   < > 4.4 4.0 4.8  CL 106   < > 111 111 108  CO2 22   < > 21* 20* 20*  GLUCOSE 97   < > 103* 172* 122*  BUN 40*   < > 62* 51* 65*  CREATININE 1.51*   < > 1.75* 1.54* 1.90*  CALCIUM  8.7*   < > 8.8* 8.4* 8.3*  GFRNONAA 33*   < > 28* 32* 25*  PROT 6.2*  --  6.0* 6.4*  --   ALBUMIN 4.0  --  3.8 4.0  --   AST 15  --  16 19  --   ALT 8  --  9 8  --   ALKPHOS 101  --  101 107  --   BILITOT 1.1  --  1.2 1.0  --    < > = values in this interval not displayed.    RADIOGRAPHIC STUDIES: I have personally reviewed the radiological images as listed and agreed with the findings in the report. CT ABDOMEN PELVIS WO  CONTRAST Result Date: 04/12/2024 CLINICAL DATA:  Epigastric abdominal pain radiating into back and history of CLL/SLL. EXAM: CT ABDOMEN AND PELVIS WITHOUT CONTRAST TECHNIQUE: Multidetector CT imaging of the abdomen and pelvis was performed following the standard protocol without IV contrast. RADIATION DOSE REDUCTION: This exam was performed according to the departmental dose-optimization program which includes automated exposure control, adjustment of the mA and/or kV according to patient size and/or use of iterative reconstruction technique. COMPARISON:  PET scan on 03/22/2021 FINDINGS: Lower chest: Scarring and atelectasis at both lung bases. Trace bilateral pleural fluid, left greater than right. Hepatobiliary: Unremarkable unenhanced appearance of the liver. The gallbladder contains several small calcified gallstones. No evidence of biliary ductal dilatation. Pancreas: Unremarkable. No pancreatic ductal dilatation or surrounding inflammatory changes. Spleen: Increased splenomegaly since the prior PET scan with estimated volume of 709 mL. Adrenals/Urinary Tract: Adrenal glands are unremarkable. Kidneys are normal, without renal calculi, focal lesion, or hydronephrosis. Bladder is unremarkable. Stomach/Bowel: Moderate fecal material in the colon. Diffuse colonic diverticulosis. No evidence of bowel obstruction or free intraperitoneal air. Vascular/Lymphatic: Atherosclerosis of the abdominal aorta and iliac arteries without aneurysm. Degree chronic lymphadenopathy in the retroperitoneum and bilateral iliac chains and inguinal regions is increased in prominence compared to the prior PET scan. At the level of the distal aorta previous estimated maximum transverse diameter diffuse retroperitoneal lymph node mass was approximately 8.5 cm and is now 9.5 cm. Bilateral iliac lymphadenopathy shows significant increase in size, especially on the right with dominant external iliac node now measuring up to 4.8 x 7.6 cm,  previously roughly 3.2 cm in short axis. Reproductive: Status post hysterectomy. No adnexal masses. Other: No abdominal wall hernia or abnormality. No abdominopelvic ascites. Musculoskeletal: Degenerative disc disease throughout the visualized spine. Mild loss of height of the T12 vertebral body at the level of the superior endplate. No visualized bony lesions. IMPRESSION: 1. Increased prominence of retroperitoneal, bilateral iliac and inguinal lymphadenopathy compared to the prior PET scan on 03/22/2021. Findings are consistent with worsening/transformation of known CLL/SLL. 2. Increased splenomegaly since the prior PET scan with estimated volume of 709 mL. 3. Cholelithiasis. 4. Diffuse colonic diverticulosis. 5. Trace bilateral pleural fluid, left greater than right. 6. Aortic atherosclerosis. Electronically Signed   By: Erica Hau M.D.   On: 04/12/2024 11:50    ASSESSMENT & PLAN:   CLL (chronic lymphocytic leukemia) (HCC) # CLL: [56% OF NUCLEI POSITIVE FOR A 13Q DELETION]; April 2022-PET scan shows significant lymphadenopathy above and below diaphragm; splenomegaly. Discontinued Ibrutinib  [hypertensive emergencies] acalabrutinib  [leg cramps even with minimum dose].Discontinued zanubrutinib . hypokalemia loss and renal failure/extreme fatigue etc.    # Progression of disease given the rising white count. Again declined any treatment options.   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  #OCT 2024- I sat- 11%- on  gentle iron [iron biglycinate; 28 mg ] 1 pill a day.  Proceed with retacrit  today. WBC 112; HB -7.3; PLATELETS- PENDING.   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   #  s/p pallaitive care-as per patient wishes continue follow-up hospice.  Continue supportive care.  Discussed with Dr. Annelle Kiel.  # ACP- DNR   #  DISPOSITION: # retacrit  today # 1 unit of blood tomorrow # in 2 weeks- labs- cbc/HOLD tube; retacrit - D-2 possible 1 unit PRBC # follow up in 4 weeks- MD; labs- cbc/ bmp; LDH;  possible Retacrit - HOLD tube; D-2- possible 1 unit PRBC- Dr.B All questions were answered. The patient knows to call the clinic with any problems, questions or concerns.    Gwyn Leos, MD 04/28/2024 3:58 PM

## 2024-04-28 NOTE — Progress Notes (Signed)
 Palliative Medicine [facility] at [facility] Telephone:([phone]) [phone] Fax:(336) [phone]   Name: Lindsay [name] Date: 04/28/2024 MRN: [id number]  DOB: 08/03/1934  Patient Care Team: Lyle San, MD as PCP - General (Family Medicine) Gwyn Leos, MD as Consulting Physician (Internal Medicine)    REASON FOR CONSULTATION: Lindsay [name] is a 89 y.o. female with multiple medical problems including CLL currently on surveillance because of intolerance to previous treatments, transfusion dependence, history of CHF and CKD.  Patient has had rising white counts.  Was referred to palliative care to address goals and manage ongoing symptoms.  SOCIAL HISTORY:     reports that she has never smoked. She has never used smokeless tobacco. She reports that she does not drink alcohol and does not use drugs.  ADVANCE DIRECTIVES:    CODE STATUS: DNR/DNI (DNR order signed on 04/14/24)  PAST MEDICAL HISTORY: Past Medical History:  Diagnosis Date   Arthritis    RHEUMATOID   CHF (congestive heart failure) (HCC)    CLL (chronic lymphocytic leukemia) (HCC)    Edema    MILD ANKLE OCCAS   Fibromyalgia    GERD (gastroesophageal reflux disease)    Hypertension    Kidney stones    RLS (restless legs syndrome)     PAST SURGICAL HISTORY:  Past Surgical History:  Procedure Laterality Date   ABDOMINAL HYSTERECTOMY     CATARACT EXTRACTION W/PHACO Left 02/07/2016   Procedure: CATARACT EXTRACTION PHACO AND INTRAOCULAR LENS PLACEMENT (IOC);  Surgeon: Steven Dingeldein, MD;  Location: [facility] ORS;  Service: Ophthalmology;  Laterality: Left;  US  01:33 AP% 24.7 CDE 39.57 fluid pack lot # [id number]   EXTRACORPOREAL SHOCK WAVE LITHOTRIPSY Right 07/03/2019   Procedure: EXTRACORPOREAL SHOCK WAVE LITHOTRIPSY (ESWL);  Surgeon: Rea Cambridge, MD;  Location: [facility] ORS;  Service: Urology;  Laterality: Right;   EYE SURGERY     HIP ARTHROPLASTY Left 04/20/2020   Procedure:  ARTHROPLASTY BIPOLAR HIP (HEMIARTHROPLASTY);  Surgeon: Molli Angelucci, MD;  Location: [facility] ORS;  Service: Orthopedics;  Laterality: Left;   LITHOTRIPSY     TONSILLECTOMY      HEMATOLOGY/ONCOLOGY HISTORY:  Oncology History Overview Note  # LYMPHOCYTOSIS [25,K]/ANEMIA 9-10]; platelets-N; IgVH Somatic Hypermutation was not detected.56% OF NUCLEI POSITIVE FOR A 13Q DELETION; The phenotype is most consistent with a diagnosis of 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 Low level hypermetabolism consistent with CLL.2. Splenomegaly but no focal splenic lesions.  # April 2022- 13 Q DEL; IGVH- UNMUTATED  # MID April 2022- IBRUTINIB  280 mg x3 days [STOPPED sec ER/hypertensive emergency- 191/129]  # MAY 26th, 2022- start acalabrutinib  100 mg once a day [reduced dose]; intermittent; discontinued  September mid 2022. [Muscle cramps.]  # MAY 1st week- 2024- Zanubrutinib  80 mg; stopped after 1-2 weeks- sec to acute renal failure hyperkalemia/extreme fatigue etc.  Patient declined further therapies.       CLL (chronic lymphocytic leukemia) (HCC)  02/14/2021 Initial Diagnosis   CLL (chronic lymphocytic leukemia) (HCC)   04/07/2021 Cancer Staging   Staging form: Chronic Lymphocytic Leukemia / Small Lymphocytic Lymphoma, AJCC 8th Edition - Clinical: Modified Rai Stage III (Modified Rai risk: High, Binet: Stage C) - Signed by Gwyn Leos, MD on 04/07/2021 Stage prefix: Initial diagnosis   12/16/2021 - 12/16/2021 Chemotherapy   Patient is on Treatment Plan : CLL - Rituximab q 4 weeks  ALLERGIES:  is allergic to gabapentin and demerol [meperidine].  MEDICATIONS:  Current Outpatient Medications  Medication Sig Dispense Refill   amitriptyline  (ELAVIL ) 10 MG tablet Take 20 mg by mouth at bedtime.     cyanocobalamin  (,VITAMIN B-12,) 1000 MCG/ML injection  Inject into the muscle every 14 (fourteen) days. Next injection 4/16     cyanocobalamin  (VITAMIN B12) 1000 MCG tablet Take 1,000 mcg by mouth daily.     DULoxetine  (CYMBALTA ) 20 MG capsule Take 20 mg by mouth daily.     Ferrous Sulfate 28 MG TABS Take 28 mg by mouth daily.     furosemide  (LASIX ) 20 MG tablet TAKE TWO TABLETS (40 MG TOTAL) BY MOUTH DAILY. 60 tablet 5   MELATONIN GUMMIES PO Take 3 mg by mouth at bedtime as needed.     oxyCODONE -acetaminophen  (PERCOCET/ROXICET) 5-325 MG tablet Take 1-2 tablets by mouth every 4 (four) hours as needed for severe pain (pain score 7-10). 60 tablet 0   polyethylene glycol powder (GLYCOLAX /MIRALAX ) 17 GM/SCOOP powder Take 17 g by mouth 2 (two) times daily as needed for moderate constipation. 255 g 0   No current facility-administered medications for this visit.    VITAL SIGNS: There were no vitals taken for this visit. There were no vitals filed for this visit.   Estimated body mass index is 15.74 kg/m as calculated from the following:   Height as of an earlier encounter on 04/28/24: 5\' 4"  (1.626 m).   Weight as of an earlier encounter on 04/28/24: 91 lb 11.2 oz (41.6 kg).  LABS: CBC:    Component Value Date/Time   WBC 132.7 (HH) 04/13/2024 1123   HGB 9.3 (L) 04/13/2024 1123   HGB 6.4 (LL) 03/31/2024 1530   HGB 14.1 03/06/2014 2049   HCT 30.2 (L) 04/13/2024 1123   HCT 42.9 03/06/2014 2049   PLT 152 04/13/2024 1123   PLT 173 03/31/2024 1530   PLT 240 03/06/2014 2049   MCV 100.7 (H) 04/13/2024 1123   MCV 88 03/06/2014 2049   NEUTROABS 5.2 03/31/2024 1530   NEUTROABS 7.3 (H) 03/06/2014 2049   LYMPHSABS 122.7 (H) 03/31/2024 1530   LYMPHSABS 6.6 (H) 03/06/2014 2049   MONOABS 5.8 (H) 03/31/2024 1530   MONOABS 1.3 (H) 03/06/2014 2049   EOSABS 1.8 (H) 03/31/2024 1530   EOSABS 0.4 03/06/2014 2049   BASOSABS 0.1 03/31/2024 1530   BASOSABS 0.1 03/06/2014 2049   Comprehensive Metabolic Panel:    Component Value Date/Time   NA 137  04/28/2024 1446   NA 138 09/04/2023 1154   NA 138 03/06/2014 2049   K 4.8 04/28/2024 1446   K 3.6 03/06/2014 2049   CL 108 04/28/2024 1446   CL 107 03/06/2014 2049   CO2 20 (L) 04/28/2024 1446   CO2 29 03/06/2014 2049   BUN 65 (H) 04/28/2024 1446   BUN 58 (H) 09/04/2023 1154   BUN 14 03/06/2014 2049   CREATININE 1.90 (H) 04/28/2024 1446   CREATININE 0.75 03/06/2014 2049   GLUCOSE 122 (H) 04/28/2024 1446   GLUCOSE 103 (H) 03/06/2014 2049   CALCIUM  8.3 (L) 04/28/2024 1446   CALCIUM  8.9 03/06/2014 2049   AST 19 04/13/2024 1123   AST 15 09/25/2023 0855   ALT 8 04/13/2024 1123   ALT 8 09/25/2023 0855   ALKPHOS 107 04/13/2024 1123   BILITOT 1.0 04/13/2024 1123   BILITOT 1.1 09/25/2023 0855   PROT 6.4 (L) 04/13/2024 1123   ALBUMIN 4.0 04/13/2024 1123    RADIOGRAPHIC STUDIES: CT  ABDOMEN PELVIS WO CONTRAST Result Date: 04/12/2024 CLINICAL DATA:  Epigastric abdominal pain radiating into back and history of CLL/SLL. EXAM: CT ABDOMEN AND PELVIS WITHOUT CONTRAST TECHNIQUE: Multidetector CT imaging of the abdomen and pelvis was performed following the standard protocol without IV contrast. RADIATION DOSE REDUCTION: This exam was performed according to the departmental dose-optimization program which includes automated exposure control, adjustment of the mA and/or kV according to patient size and/or use of iterative reconstruction technique. COMPARISON:  PET scan on 03/22/2021 FINDINGS: Lower chest: Scarring and atelectasis at both lung bases. Trace bilateral pleural fluid, left greater than right. Hepatobiliary: Unremarkable unenhanced appearance of the liver. The gallbladder contains several small calcified gallstones. No evidence of biliary ductal dilatation. Pancreas: Unremarkable. No pancreatic ductal dilatation or surrounding inflammatory changes. Spleen: Increased splenomegaly since the prior PET scan with estimated volume of 709 mL. Adrenals/Urinary Tract: Adrenal glands are unremarkable.  Kidneys are normal, without renal calculi, focal lesion, or hydronephrosis. Bladder is unremarkable. Stomach/Bowel: Moderate fecal material in the colon. Diffuse colonic diverticulosis. No evidence of bowel obstruction or free intraperitoneal air. Vascular/Lymphatic: Atherosclerosis of the abdominal aorta and iliac arteries without aneurysm. Degree chronic lymphadenopathy in the retroperitoneum and bilateral iliac chains and inguinal regions is increased in prominence compared to the prior PET scan. At the level of the distal aorta previous estimated maximum transverse diameter diffuse retroperitoneal lymph node mass was approximately 8.5 cm and is now 9.5 cm. Bilateral iliac lymphadenopathy shows significant increase in size, especially on the right with dominant external iliac node now measuring up to 4.8 x 7.6 cm, previously roughly 3.2 cm in short axis. Reproductive: Status post hysterectomy. No adnexal masses. Other: No abdominal wall hernia or abnormality. No abdominopelvic ascites. Musculoskeletal: Degenerative disc disease throughout the visualized spine. Mild loss of height of the T12 vertebral body at the level of the superior endplate. No visualized bony lesions. IMPRESSION: 1. Increased prominence of retroperitoneal, bilateral iliac and inguinal lymphadenopathy compared to the prior PET scan on 03/22/2021. Findings are consistent with worsening/transformation of known CLL/SLL. 2. Increased splenomegaly since the prior PET scan with estimated volume of 709 mL. 3. Cholelithiasis. 4. Diffuse colonic diverticulosis. 5. Trace bilateral pleural fluid, left greater than right. 6. Aortic atherosclerosis. Electronically Signed   By: Erica Hau M.D.   On: 04/12/2024 11:50    PERFORMANCE STATUS (ECOG) : 1 - Symptomatic but completely ambulatory  Review of Systems Unless otherwise noted, a complete review of systems is negative.  Physical Exam General: NAD Pulmonary: Unlabored Extremities: no edema,  no joint deformities Skin: no rashes Neurological: Weakness but otherwise nonfocal  IMPRESSION: Follow-up visit.  Patient accompanied by daughter.  Patient reported doing reasonably well.  Daughter states that pain is well-controlled on every 4 hour dosing of Percocet.  Appetite remains poor.  Constipation has improved.  Patient would like to continue transfusions for now.  She did establish with palliative care and plan to transition in the future to hospice when she is no longer able to receive transfusions.  PLAN: -Continue current scope of treatment - Continue Percocet as needed for pain - Daily bowel regimen - Continue palliative care with plan for future hospice - DNR/DNI - Follow-up 2 to 3 weeks  Case and plan discussed with Dr. Valentine Gasmen  Patient expressed understanding and was in agreement with this plan. She also understands that She can call the clinic at any time with any questions, concerns, or complaints.     Time Total: 15 minutes  Visit consisted of  counseling and education dealing with the complex and emotionally intense issues of symptom management and palliative care in the setting of serious and potentially life-threatening illness.Greater than 50%  of this time was spent counseling and coordinating care related to the above assessment and plan.  Signed by: Gerilyn Kobus, PhD, NP-C

## 2024-04-29 ENCOUNTER — Inpatient Hospital Stay

## 2024-04-29 DIAGNOSIS — C9110 Chronic lymphocytic leukemia of B-cell type not having achieved remission: Secondary | ICD-10-CM

## 2024-04-29 DIAGNOSIS — D649 Anemia, unspecified: Secondary | ICD-10-CM

## 2024-04-29 LAB — PREPARE RBC (CROSSMATCH)

## 2024-04-29 MED ORDER — ACETAMINOPHEN 325 MG PO TABS
650.0000 mg | ORAL_TABLET | Freq: Once | ORAL | Status: AC
  Administered 2024-04-29: 650 mg via ORAL
  Filled 2024-04-29: qty 2

## 2024-04-29 MED ORDER — SODIUM CHLORIDE 0.9% IV SOLUTION
250.0000 mL | INTRAVENOUS | Status: DC
  Administered 2024-04-29: 100 mL via INTRAVENOUS
  Filled 2024-04-29: qty 250

## 2024-04-29 NOTE — Patient Instructions (Signed)

## 2024-04-30 LAB — TYPE AND SCREEN
ABO/RH(D): O POS
Antibody Screen: POSITIVE
DAT, IgG: POSITIVE
DAT, complement: POSITIVE
Donor AG Type: NEGATIVE
Unit division: 0

## 2024-04-30 LAB — BPAM RBC
Blood Product Expiration Date: 202506092359
ISSUE DATE / TIME: 202505130937
PRODUCT CODE: 202506092359
Unit Type and Rh: 5100
Unit Type and Rh: 5100

## 2024-05-01 ENCOUNTER — Telehealth: Payer: Self-pay | Admitting: Dietician

## 2024-05-01 ENCOUNTER — Inpatient Hospital Stay: Admitting: Dietician

## 2024-05-01 ENCOUNTER — Other Ambulatory Visit: Payer: Self-pay | Admitting: Hospice and Palliative Medicine

## 2024-05-01 NOTE — Telephone Encounter (Signed)
 Attempted to reach patient's daughter for a scheduled remote nutrition consult. Provided my cell# on voice mail to return call for her mom's follow up nutrition consult.  Carleen Chary, RDN, LDN Registered Dietitian, [facility] Part Time Remote (Usual office hours: Tuesday-Thursday) Cell: [phone]

## 2024-05-08 ENCOUNTER — Telehealth: Payer: Self-pay | Admitting: *Deleted

## 2024-05-08 ENCOUNTER — Inpatient Hospital Stay: Admitting: Nurse Practitioner

## 2024-05-08 ENCOUNTER — Other Ambulatory Visit: Payer: Self-pay | Admitting: *Deleted

## 2024-05-08 ENCOUNTER — Other Ambulatory Visit: Payer: Self-pay | Admitting: Hospice and Palliative Medicine

## 2024-05-08 ENCOUNTER — Inpatient Hospital Stay

## 2024-05-08 DIAGNOSIS — D649 Anemia, unspecified: Secondary | ICD-10-CM

## 2024-05-08 DIAGNOSIS — C9110 Chronic lymphocytic leukemia of B-cell type not having achieved remission: Secondary | ICD-10-CM

## 2024-05-08 LAB — CBC WITH DIFFERENTIAL (CANCER CENTER ONLY)
Abs Immature Granulocytes: 0.35 10*3/uL — ABNORMAL HIGH (ref 0.00–0.07)
Basophils Absolute: 0.1 10*3/uL (ref 0.0–0.1)
Basophils Relative: 0 %
Eosinophils Absolute: 1 10*3/uL — ABNORMAL HIGH (ref 0.0–0.5)
Eosinophils Relative: 1 %
HCT: 24.5 % — ABNORMAL LOW (ref 36.0–46.0)
Hemoglobin: 7.5 g/dL — ABNORMAL LOW (ref 12.0–15.0)
Immature Granulocytes: 0 %
Lymphocytes Relative: 91 %
Lymphs Abs: 82.6 10*3/uL — ABNORMAL HIGH (ref 0.7–4.0)
MCH: 30.6 pg (ref 26.0–34.0)
MCHC: 30.6 g/dL (ref 30.0–36.0)
MCV: 100 fL (ref 80.0–100.0)
Monocytes Absolute: 2.8 10*3/uL — ABNORMAL HIGH (ref 0.1–1.0)
Monocytes Relative: 3 %
Neutro Abs: 4.7 10*3/uL (ref 1.7–7.7)
Neutrophils Relative %: 5 %
Platelet Count: 146 10*3/uL — ABNORMAL LOW (ref 150–400)
RBC: 2.45 MIL/uL — ABNORMAL LOW (ref 3.87–5.11)
RDW: 21.8 % — ABNORMAL HIGH (ref 11.5–15.5)
Smear Review: NORMAL
WBC Count: 91.5 10*3/uL (ref 4.0–10.5)
nRBC: 0 % (ref 0.0–0.2)

## 2024-05-08 NOTE — Progress Notes (Signed)
 Virtual Visit Progress Note  Symptom Management Clinic  [facility] at [facility] A Department of the [facility] [address], [address] [place], [place] [address] [phone] (phone) [phone] (fax)  I connected with Lindsay [name] on 05/08/24 at  2:00 PM EDT by telephone visit and verified that I am speaking with the correct person using two identifiers.   I discussed the limitations, risks, security and privacy concerns of performing an evaluation and management service by telemedicine and the availability of in-person appointments. I also discussed with the patient that there may be a patient responsible charge related to this service. The patient expressed understanding and agreed to proceed.   Other persons participating in the visit and their role in the encounter: daughter Lindsay [name]   Patient's location: daughter's house  Provider's location: clinic   Chief Complaint: Symptomatic anemia    Patient Care Team: Lyle San, MD as PCP - General (Family Medicine) Gwyn Leos, MD as Consulting Physician (Internal Medicine)   Name of the patient: Lindsay [name]  [id number]  November 14, 1934   Date of visit: 05/08/24  Diagnosis- CLL  Heme/Onc history:  Oncology History Overview Note  # LYMPHOCYTOSIS [25,K]/ANEMIA 9-10]; platelets-N; IgVH Somatic Hypermutation was not detected.56% OF NUCLEI POSITIVE FOR A 13Q DELETION; The phenotype is most consistent with a diagnosis of 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 Low level hypermetabolism consistent with CLL.2. Splenomegaly but no focal splenic lesions.  # April 2022- 13 Q DEL; IGVH- UNMUTATED  # MID April 2022- IBRUTINIB  280 mg x3 days [STOPPED sec ER/hypertensive emergency- 191/129]  # MAY 26th, 2022- start acalabrutinib  100 mg once  a day [reduced dose]; intermittent; discontinued  September mid 2022. [Muscle cramps.]  # MAY 1st week- 2024- Zanubrutinib  80 mg; stopped after 1-2 weeks- sec to acute renal failure hyperkalemia/extreme fatigue etc.  Patient declined further therapies.       CLL (chronic lymphocytic leukemia) (HCC)  02/14/2021 Initial Diagnosis   CLL (chronic lymphocytic leukemia) (HCC)   04/07/2021 Cancer Staging   Staging form: Chronic Lymphocytic Leukemia / Small Lymphocytic Lymphoma, AJCC 8th Edition - Clinical: Modified Rai Stage III (Modified Rai risk: High, Binet: Stage C) - Signed by Gwyn Leos, MD on 04/07/2021 Stage prefix: Initial diagnosis   12/16/2021 - 12/16/2021 Chemotherapy   Patient is on Treatment Plan : CLL - Rituximab q 4 weeks       Interval history- Lindsay [name] is a 89 y.o. female with above history of CLL who agrees to telephone visit to review lab results. Hmg decreased to 7.5. Increased symptoms including lethargy and malaise. Appetite decreased. Continues ensure. Suspects she needs blood transfusion.   Review of systems- Review of Systems  Constitutional:  Positive for malaise/fatigue.  Respiratory:  Positive for shortness of breath.   Gastrointestinal:  Negative for blood in stool and melena.  Genitourinary:  Negative for hematuria.  Psychiatric/Behavioral:  Negative for depression.      Allergies  Allergen Reactions   Gabapentin Swelling    05/01/23: Pt stated she has facial swelling when taking Gabapentin.   Demerol [Meperidine]     Past Medical History:  Diagnosis Date   Arthritis    RHEUMATOID   CHF (congestive heart failure) (HCC)    CLL (chronic lymphocytic leukemia) (HCC)    Edema    MILD ANKLE OCCAS   Fibromyalgia  GERD (gastroesophageal reflux disease)    Hypertension    Kidney stones    RLS (restless legs syndrome)     Past Surgical History:  Procedure Laterality Date   ABDOMINAL HYSTERECTOMY     CATARACT EXTRACTION W/PHACO  Left 02/07/2016   Procedure: CATARACT EXTRACTION PHACO AND INTRAOCULAR LENS PLACEMENT (IOC);  Surgeon: Steven Dingeldein, MD;  Location: [facility] ORS;  Service: Ophthalmology;  Laterality: Left;  US  01:33 AP% 24.7 CDE 39.57 fluid pack lot # [id number]   EXTRACORPOREAL SHOCK WAVE LITHOTRIPSY Right 07/03/2019   Procedure: EXTRACORPOREAL SHOCK WAVE LITHOTRIPSY (ESWL);  Surgeon: Rea Cambridge, MD;  Location: [facility] ORS;  Service: Urology;  Laterality: Right;   EYE SURGERY     HIP ARTHROPLASTY Left 04/20/2020   Procedure: ARTHROPLASTY BIPOLAR HIP (HEMIARTHROPLASTY);  Surgeon: Molli Angelucci, MD;  Location: [facility] ORS;  Service: Orthopedics;  Laterality: Left;   LITHOTRIPSY     TONSILLECTOMY      Current Outpatient Medications:    amitriptyline  (ELAVIL ) 10 MG tablet, Take 20 mg by mouth at bedtime., Disp: , Rfl:    cyanocobalamin  (,VITAMIN B-12,) 1000 MCG/ML injection, Inject into the muscle every 14 (fourteen) days. Next injection 4/16, Disp: , Rfl:    cyanocobalamin  (VITAMIN B12) 1000 MCG tablet, Take 1,000 mcg by mouth daily., Disp: , Rfl:    DULoxetine  (CYMBALTA ) 20 MG capsule, Take 20 mg by mouth daily., Disp: , Rfl:    Ferrous Sulfate 28 MG TABS, Take 28 mg by mouth daily., Disp: , Rfl:    furosemide  (LASIX ) 20 MG tablet, TAKE TWO TABLETS (40 MG TOTAL) BY MOUTH DAILY., Disp: 60 tablet, Rfl: 5   MELATONIN GUMMIES PO, Take 3 mg by mouth at bedtime as needed., Disp: , Rfl:    oxyCODONE -acetaminophen  (PERCOCET/ROXICET) 5-325 MG tablet, Take 1-2 tablets by mouth every 4 (four) hours as needed for severe pain (pain score 7-10)., Disp: 60 tablet, Rfl: 0   polyethylene glycol powder (GLYCOLAX /MIRALAX ) 17 GM/SCOOP powder, Take 17 g by mouth 2 (two) times daily as needed for moderate constipation., Disp: 255 g, Rfl: 0  Physical exam: Exam limited due to telemedicine  There were no vitals filed for this visit. Physical Exam Pulmonary:     Effort: No respiratory distress.  Neurological:     Mental Status:  She is oriented to person, place, and time.        Latest Ref Rng & Units 05/08/2024   11:49 AM  CBC  WBC 4.0 - 10.5 K/uL 91.5   Hemoglobin 12.0 - 15.0 g/dL 7.5   Hematocrit 45.4 - 46.0 % 24.5   Platelets 150 - 400 K/uL 146    Assessment and plan- Patient is a 89 y.o. female diagnosed with CLL, receiving supportive care and transfusions, who presents for:    1) Symptomatic anemia- Hmg 7.5. Due to CLL. Stable over past 10 days however, increasing symptoms of weakness. Plan for 1 unit pRBCs tomorrow.   Follow up as planned with Dr Valentine Gasmen  Visit Diagnosis 1. Symptomatic anemia    Patient expressed understanding and was in agreement with this plan. She also understands that She can call clinic at any time with any questions, concerns, or complaints.   I discussed the assessment and treatment plan with the patient. The patient was provided an opportunity to ask questions and all were answered. The patient agreed with the plan and demonstrated an understanding of the instructions.   The patient was advised to call back or seek an in-person evaluation if the symptoms  worsen or if the condition fails to improve as anticipated.   I spent 10 minutes on this telephone encounter.   Thank you for allowing me to participate in the care of this very pleasant patient.   Kenney Peacemaker, DNP, AGNP-C [facility]

## 2024-05-08 NOTE — Telephone Encounter (Signed)
 Critical Lab called by Dino Frank in lab. WBC is 91.5 today. Readback . Notified lauren Leighton Punches NP. Hgb is 7.5. Pt will get 1 unit of PRBC's tomorrow.

## 2024-05-09 ENCOUNTER — Inpatient Hospital Stay

## 2024-05-09 DIAGNOSIS — C9110 Chronic lymphocytic leukemia of B-cell type not having achieved remission: Secondary | ICD-10-CM

## 2024-05-09 DIAGNOSIS — D649 Anemia, unspecified: Secondary | ICD-10-CM

## 2024-05-09 LAB — PREPARE RBC (CROSSMATCH)

## 2024-05-09 MED ORDER — ACETAMINOPHEN 325 MG PO TABS
650.0000 mg | ORAL_TABLET | Freq: Once | ORAL | Status: AC
  Administered 2024-05-09: 650 mg via ORAL
  Filled 2024-05-09: qty 2

## 2024-05-09 MED ORDER — SODIUM CHLORIDE 0.9% IV SOLUTION
250.0000 mL | INTRAVENOUS | Status: DC
  Administered 2024-05-09: 50 mL via INTRAVENOUS
  Filled 2024-05-09: qty 250

## 2024-05-09 MED ORDER — DIPHENHYDRAMINE HCL 25 MG PO CAPS
25.0000 mg | ORAL_CAPSULE | Freq: Once | ORAL | Status: AC
  Administered 2024-05-09: 25 mg via ORAL
  Filled 2024-05-09: qty 1

## 2024-05-09 NOTE — Patient Instructions (Signed)

## 2024-05-11 LAB — TYPE AND SCREEN
ABO/RH(D): O POS
Antibody Screen: POSITIVE
DAT, IgG: POSITIVE
DAT, complement: POSITIVE
Donor AG Type: NEGATIVE
Unit division: 0

## 2024-05-11 LAB — BPAM RBC
Blood Product Expiration Date: 202506192359
ISSUE DATE / TIME: 202505231357
Unit Type and Rh: 5100

## 2024-05-13 ENCOUNTER — Other Ambulatory Visit

## 2024-05-14 ENCOUNTER — Ambulatory Visit

## 2024-05-15 ENCOUNTER — Other Ambulatory Visit: Payer: Self-pay | Admitting: Hospice and Palliative Medicine

## 2024-05-16 ENCOUNTER — Encounter: Payer: Self-pay | Admitting: Internal Medicine

## 2024-05-21 ENCOUNTER — Ambulatory Visit: Attending: Cardiology | Admitting: Cardiology

## 2024-05-21 ENCOUNTER — Encounter: Payer: Self-pay | Admitting: Cardiology

## 2024-05-21 VITALS — BP 124/42 | HR 67 | Ht 64.0 in | Wt 93.2 lb

## 2024-05-21 DIAGNOSIS — I5022 Chronic systolic (congestive) heart failure: Secondary | ICD-10-CM | POA: Diagnosis not present

## 2024-05-21 DIAGNOSIS — I350 Nonrheumatic aortic (valve) stenosis: Secondary | ICD-10-CM | POA: Diagnosis not present

## 2024-05-21 NOTE — Progress Notes (Signed)
 Cardiology Office Note:    Date:  05/21/2024   ID:  [name], DOB June 08, 1934, MRN [id number]  PCP:  Lindsay San, MD   [facility] Providers Cardiologist:  None     Referring MD: Lindsay Console, FNP   Chief Complaint  Patient presents with   Hypertension    Referred for chronic systolic heart failure and hypertension. Patient is doing well on today. Meds reviewed.     History of Present Illness:    Lindsay [name] is a 89 y.o. female with a hx of hypertension, cardiomyopathy EF 40 to 45%, moderate aortic valve stenosis, mild to moderate MR, CKD, RLS, CLL diagnosed in 2022 (stopped chemo due to chemo side effects-renal failure, hyperkalemia, fatigue), anemia due to CLL presenting due to CHF.  Patient has a history of CLL diagnosed back in 2022.  Follows up with oncologist, started on chemotherapy.  Could not tolerate due to hyperkalemia, fatigue.  Patient eventually decided to stop.  Interim evaluation by oncology via CT scan showed progression of disease.  Recent blood work showed significant anemia, hemoglobin 6.4 .  Transfused PRBCs per oncology team.  Follows up at heart failure clinic, currently on Lasix  40 mg daily with good effect.  History of hyperkalemia while on chemo.  Low blood pressures preventing use of GDMT at this time.  She denies bleeding.  Denies shortness of breath, chest pain or edema.  Tolerating Lasix  40 mg daily as prescribed.  Doing well today,  Echo 06/2023 EF 40 to 45%, moderate AS, mild to moderate MR  Past Medical History:  Diagnosis Date   Arthritis    RHEUMATOID   CHF (congestive heart failure) (HCC)    CLL (chronic lymphocytic leukemia) (HCC)    Edema    MILD ANKLE OCCAS   Fibromyalgia    GERD (gastroesophageal reflux disease)    Hypertension    Kidney stones    RLS (restless legs syndrome)     Past Surgical History:  Procedure Laterality Date   ABDOMINAL HYSTERECTOMY     CATARACT EXTRACTION W/PHACO Left 02/07/2016    Procedure: CATARACT EXTRACTION PHACO AND INTRAOCULAR LENS PLACEMENT (IOC);  Surgeon: Lindsay Dingeldein, MD;  Location: [facility] ORS;  Service: Ophthalmology;  Laterality: Left;  US  01:33 AP% 24.7 CDE 39.57 fluid pack lot # [id number]   EXTRACORPOREAL SHOCK WAVE LITHOTRIPSY Right 07/03/2019   Procedure: EXTRACORPOREAL SHOCK WAVE LITHOTRIPSY (ESWL);  Surgeon: Lindsay Cambridge, MD;  Location: [facility] ORS;  Service: Urology;  Laterality: Right;   EYE SURGERY     HIP ARTHROPLASTY Left 04/20/2020   Procedure: ARTHROPLASTY BIPOLAR HIP (HEMIARTHROPLASTY);  Surgeon: Lindsay Angelucci, MD;  Location: [facility] ORS;  Service: Orthopedics;  Laterality: Left;   LITHOTRIPSY     TONSILLECTOMY      Current Medications: Current Meds  Medication Sig   amitriptyline  (ELAVIL ) 10 MG tablet Take 20 mg by mouth at bedtime.   cyanocobalamin  (,VITAMIN B-12,) 1000 MCG/ML injection Inject into the muscle every 14 (fourteen) days. Next injection 4/16   cyanocobalamin  (VITAMIN B12) 1000 MCG tablet Take 1,000 mcg by mouth daily.   DULoxetine  (CYMBALTA ) 20 MG capsule Take 20 mg by mouth daily.   Ferrous Sulfate 28 MG TABS Take 28 mg by mouth daily.   furosemide  (LASIX ) 20 MG tablet TAKE TWO TABLETS (40 MG TOTAL) BY MOUTH DAILY.   MELATONIN GUMMIES PO Take 3 mg by mouth at bedtime as needed.   oxyCODONE -acetaminophen  (PERCOCET/ROXICET) 5-325 MG tablet Take 1-2 tablets by mouth every 4 (  four) hours as needed for severe pain (pain score 7-10).   polyethylene glycol powder (GLYCOLAX /MIRALAX ) 17 GM/SCOOP powder Take 17 g by mouth 2 (two) times daily as needed for moderate constipation.     Allergies:   Gabapentin and Demerol [meperidine]   Social History   Socioeconomic History   Marital status: Widowed    Spouse name: Not on file   Number of children: Not on file   Years of education: Not on file   Highest education level: Not on file  Occupational History   Not on file  Tobacco Use   Smoking status: Never   Smokeless tobacco:  Never  Substance and Sexual Activity   Alcohol use: No   Drug use: Never   Sexual activity: Not Currently  Other Topics Concern   Not on file  Social History Narrative   Lives with daughter, in [place]. 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    Social Drivers of Corporate investment banker Strain: Low Risk  (12/24/2023)   Received from [facility]   Overall Financial Resource Strain (CARDIA)    Difficulty of Paying Living Expenses: Not hard at all  Food Insecurity: No Food Insecurity (12/24/2023)   Received from [facility]   Hunger Vital Sign    Ran Out of Food in the Last Year: Never true    Worried About Running Out of Food in the Last Year: Never true  Transportation Needs: No Transportation Needs (12/24/2023)   Received from [facility]   PRAPARE - Transportation    Lack of Transportation (Non-Medical): No    In the past 12 months, has lack of transportation kept you from medical appointments or from getting medications?: No  Physical Activity: Not on file  Stress: Not on file  Social Connections: Not on file     Family History: The patient's family history includes Cancer in her mother.  ROS:   Please see the history of present illness.     All other systems reviewed and are negative.  EKGs/Labs/Other Studies Reviewed:    The following studies were reviewed today:  EKG Interpretation Date/Time:  Wednesday May 21 2024 11:04:30 EDT Ventricular Rate:  67 PR Interval:  170 QRS Duration:  152 QT Interval:  464 QTC Calculation: 490 R Axis:   -34  Text Interpretation: Normal sinus rhythm Left axis deviation Left bundle branch block Confirmed by Lindsay [name] ([id number]) on 05/21/2024 11:13:15 AM    Recent Labs: 03/31/2024: B Natriuretic Peptide 636.2; Magnesium  2.4 04/13/2024: ALT 8 04/28/2024: BUN 65; Creatinine 1.90; Potassium 4.8; Sodium 137 05/08/2024: Hemoglobin 7.5; Platelet Count 146  Recent Lipid  Panel    Component Value Date/Time   CHOL 137 06/19/2023 0415   TRIG 53 06/19/2023 0415   HDL 36 (L) 06/19/2023 0415   CHOLHDL 3.8 06/19/2023 0415   VLDL 11 06/19/2023 0415   LDLCALC 90 06/19/2023 0415     Risk Assessment/Calculations:             Physical Exam:    VS:  BP (!) 124/42   Pulse 67   Ht 5\' 4"  (1.626 m)   Wt 93 lb 3.2 oz (42.3 kg)   SpO2 100%   BMI 16.00 kg/m     Wt Readings from Last 3 Encounters:  05/21/24 93 lb 3.2 oz (42.3 kg)  04/28/24 91 lb 11.2 oz (41.6 kg)  04/14/24 95 lb 3.2 oz (43.2 kg)     GEN:  Well  nourished, well developed in no acute distress HEENT: Normal NECK: No JVD; No carotid bruits CARDIAC: RRR, 2/6 systolic murmur RESPIRATORY:  Clear to auscultation without rales, wheezing or rhonchi  ABDOMEN: Soft, non-tender, non-distended MUSCULOSKELETAL:  No edema; No deformity  SKIN: Warm and dry NEUROLOGIC:  Alert and oriented x 3 PSYCHIATRIC:  Normal affect   ASSESSMENT:    1. Chronic systolic heart failure (HCC)   2. Aortic valve stenosis, etiology of cardiac valve disease unspecified    PLAN:    In order of problems listed above:  Cardiomyopathy EF 40 to 45%.  Appears euvolemic, continue Lasix  40 mg daily.  Low normal BP preventing use of GDMT at this time.  Given overall clinical situation, comorbidities including progressing CLL, anemia requiring blood transfusion, will not recommend any invasive workup or therapy.  If BP becomes low, will consider midodrine. Moderate aortic valve stenosis, patient not a candidate for invasive therapy due to comorbidities as mentioned in #1 above.  Last echo 06/2023 EF 40 to 45%, moderate aortic valve stenosis.  Will refrain from monitoring as this will not change management plan and overall goals of care.  Follow-up abdominal CT 2 months ago showed progression of CLL.  Follow-up in 6 months      Medication Adjustments/Labs and Tests Ordered: Current medicines are reviewed at length with the  patient today.  Concerns regarding medicines are outlined above.  Orders Placed This Encounter  Procedures   EKG 12-Lead   No orders of the defined types were placed in this encounter.   Patient Instructions  Medication Instructions:  Your physician recommends that you continue on your current medications as directed. Please refer to the Current Medication list given to you today.   *If you need a refill on your cardiac medications before your next appointment, please call your pharmacy*  Lab Work: No labs ordered today  If you have labs (blood work) drawn today and your tests are completely normal, you will receive your results only by: MyChart Message (if you have MyChart) OR A paper copy in the mail If you have any lab test that is abnormal or we need to change your treatment, we will call you to review the results.  Testing/Procedures: No test ordered today   Follow-Up: At [facility], you and your health needs are our priority.  As part of our continuing mission to provide you with exceptional heart care, our providers are all part of one team.  This team includes your primary Cardiologist (physician) and Advanced Practice Providers or APPs (Physician Assistants and Nurse Practitioners) who all work together to provide you with the care you need, when you need it.  Your next appointment:   6 month(s)         Signed, Lindsay Delton, MD  05/21/2024 12:56 PM    [facility]

## 2024-05-21 NOTE — Patient Instructions (Signed)
 Medication Instructions:  Your physician recommends that you continue on your current medications as directed. Please refer to the Current Medication list given to you today.   *If you need a refill on your cardiac medications before your next appointment, please call your pharmacy*  Lab Work: No labs ordered today  If you have labs (blood work) drawn today and your tests are completely normal, you will receive your results only by: MyChart Message (if you have MyChart) OR A paper copy in the mail If you have any lab test that is abnormal or we need to change your treatment, we will call you to review the results.  Testing/Procedures: No test ordered today   Follow-Up: At [facility], you and your health needs are our priority.  As part of our continuing mission to provide you with exceptional heart care, our providers are all part of one team.  This team includes your primary Cardiologist (physician) and Advanced Practice Providers or APPs (Physician Assistants and Nurse Practitioners) who all work together to provide you with the care you need, when you need it.  Your next appointment:   6 month(s)

## 2024-05-22 ENCOUNTER — Other Ambulatory Visit: Payer: Self-pay | Admitting: Hospice and Palliative Medicine

## 2024-05-23 ENCOUNTER — Other Ambulatory Visit: Payer: Self-pay | Admitting: *Deleted

## 2024-05-23 DIAGNOSIS — D649 Anemia, unspecified: Secondary | ICD-10-CM

## 2024-05-23 DIAGNOSIS — C9110 Chronic lymphocytic leukemia of B-cell type not having achieved remission: Secondary | ICD-10-CM

## 2024-05-26 ENCOUNTER — Inpatient Hospital Stay (HOSPITAL_BASED_OUTPATIENT_CLINIC_OR_DEPARTMENT_OTHER): Admitting: Internal Medicine

## 2024-05-26 ENCOUNTER — Inpatient Hospital Stay: Attending: Nurse Practitioner

## 2024-05-26 ENCOUNTER — Inpatient Hospital Stay (HOSPITAL_BASED_OUTPATIENT_CLINIC_OR_DEPARTMENT_OTHER): Admitting: Hospice and Palliative Medicine

## 2024-05-26 ENCOUNTER — Encounter: Payer: Self-pay | Admitting: Internal Medicine

## 2024-05-26 ENCOUNTER — Other Ambulatory Visit: Payer: Self-pay | Admitting: *Deleted

## 2024-05-26 ENCOUNTER — Inpatient Hospital Stay

## 2024-05-26 VITALS — BP 154/54 | HR 67 | Temp 97.8°F | Resp 17 | Ht 64.0 in | Wt 92.0 lb

## 2024-05-26 DIAGNOSIS — D649 Anemia, unspecified: Secondary | ICD-10-CM | POA: Diagnosis not present

## 2024-05-26 DIAGNOSIS — F17200 Nicotine dependence, unspecified, uncomplicated: Secondary | ICD-10-CM | POA: Insufficient documentation

## 2024-05-26 DIAGNOSIS — G893 Neoplasm related pain (acute) (chronic): Secondary | ICD-10-CM

## 2024-05-26 DIAGNOSIS — I130 Hypertensive heart and chronic kidney disease with heart failure and stage 1 through stage 4 chronic kidney disease, or unspecified chronic kidney disease: Secondary | ICD-10-CM | POA: Insufficient documentation

## 2024-05-26 DIAGNOSIS — E860 Dehydration: Secondary | ICD-10-CM

## 2024-05-26 DIAGNOSIS — E876 Hypokalemia: Secondary | ICD-10-CM | POA: Diagnosis not present

## 2024-05-26 DIAGNOSIS — C9110 Chronic lymphocytic leukemia of B-cell type not having achieved remission: Secondary | ICD-10-CM

## 2024-05-26 DIAGNOSIS — Z79899 Other long term (current) drug therapy: Secondary | ICD-10-CM | POA: Insufficient documentation

## 2024-05-26 DIAGNOSIS — D696 Thrombocytopenia, unspecified: Secondary | ICD-10-CM | POA: Diagnosis not present

## 2024-05-26 DIAGNOSIS — Z515 Encounter for palliative care: Secondary | ICD-10-CM | POA: Diagnosis not present

## 2024-05-26 DIAGNOSIS — N183 Chronic kidney disease, stage 3 unspecified: Secondary | ICD-10-CM | POA: Insufficient documentation

## 2024-05-26 DIAGNOSIS — E611 Iron deficiency: Secondary | ICD-10-CM

## 2024-05-26 LAB — CBC WITH DIFFERENTIAL (CANCER CENTER ONLY)
Abs Immature Granulocytes: 0.25 10*3/uL — ABNORMAL HIGH (ref 0.00–0.07)
Basophils Absolute: 0.1 10*3/uL (ref 0.0–0.1)
Basophils Relative: 0 %
Eosinophils Absolute: 1.3 10*3/uL — ABNORMAL HIGH (ref 0.0–0.5)
Eosinophils Relative: 1 %
HCT: 22.3 % — ABNORMAL LOW (ref 36.0–46.0)
Hemoglobin: 6.6 g/dL — CL (ref 12.0–15.0)
Immature Granulocytes: 0 %
Lymphocytes Relative: 93 %
Lymphs Abs: 110.6 10*3/uL — ABNORMAL HIGH (ref 0.7–4.0)
MCH: 30.3 pg (ref 26.0–34.0)
MCHC: 29.6 g/dL — ABNORMAL LOW (ref 30.0–36.0)
MCV: 102.3 fL — ABNORMAL HIGH (ref 80.0–100.0)
Monocytes Absolute: 3.4 10*3/uL — ABNORMAL HIGH (ref 0.1–1.0)
Monocytes Relative: 3 %
Neutro Abs: 3.6 10*3/uL (ref 1.7–7.7)
Neutrophils Relative %: 3 %
Platelet Count: 135 10*3/uL — ABNORMAL LOW (ref 150–400)
RBC: 2.18 MIL/uL — ABNORMAL LOW (ref 3.87–5.11)
RDW: 23.5 % — ABNORMAL HIGH (ref 11.5–15.5)
Smear Review: NORMAL
WBC Count: 119.2 10*3/uL (ref 4.0–10.5)
nRBC: 0 % (ref 0.0–0.2)

## 2024-05-26 LAB — BASIC METABOLIC PANEL - CANCER CENTER ONLY
Anion gap: 9 (ref 5–15)
BUN: 61 mg/dL — ABNORMAL HIGH (ref 8–23)
CO2: 20 mmol/L — ABNORMAL LOW (ref 22–32)
Calcium: 8.1 mg/dL — ABNORMAL LOW (ref 8.9–10.3)
Chloride: 110 mmol/L (ref 98–111)
Creatinine: 1.79 mg/dL — ABNORMAL HIGH (ref 0.44–1.00)
GFR, Estimated: 27 mL/min — ABNORMAL LOW (ref >60)
Glucose, Bld: 101 mg/dL — ABNORMAL HIGH (ref 70–99)
Potassium: 4.6 mmol/L (ref 3.5–5.1)
Sodium: 139 mmol/L (ref 135–145)

## 2024-05-26 LAB — LACTATE DEHYDROGENASE: LDH: 161 U/L (ref 98–192)

## 2024-05-26 MED ORDER — EPOETIN ALFA-EPBX 20000 UNIT/ML IJ SOLN
20000.0000 [IU] | Freq: Once | INTRAMUSCULAR | Status: DC
  Filled 2024-05-26: qty 1

## 2024-05-26 MED ORDER — ONDANSETRON HCL 8 MG PO TABS: 8.0000 mg | ORAL_TABLET | Freq: Three times a day (TID) | ORAL | 0 refills | Status: DC | PRN

## 2024-05-26 NOTE — Progress Notes (Signed)
 Palliative Medicine [facility] at [facility] [phone] [phone]   Name: Lindsay [name] Date: 05/26/2024 MRN: [id number]  DOB: 04-01-1934  Patient Care Team: Lyle San, MD as PCP - General (Family Medicine) Gwyn Leos, MD as Consulting Physician (Internal Medicine)    REASON FOR CONSULTATION: Lindsay [name] is a 89 y.o. female with multiple medical problems including CLL currently on surveillance because of intolerance to previous treatments, transfusion dependence, history of CHF and CKD.  Patient has had rising white counts.  Was referred to palliative care to address goals and manage ongoing symptoms.  SOCIAL HISTORY:     reports that she has never smoked. She has never used smokeless tobacco. She reports that she does not drink alcohol and does not use drugs.  ADVANCE DIRECTIVES:    CODE STATUS: DNR/DNI (DNR order signed on 04/14/24)  PAST MEDICAL HISTORY: Past Medical History:  Diagnosis Date   Arthritis    RHEUMATOID   CHF (congestive heart failure) (HCC)    CLL (chronic lymphocytic leukemia) (HCC)    Edema    MILD ANKLE OCCAS   Fibromyalgia    GERD (gastroesophageal reflux disease)    Hypertension    Kidney stones    RLS (restless legs syndrome)     PAST SURGICAL HISTORY:  Past Surgical History:  Procedure Laterality Date   ABDOMINAL HYSTERECTOMY     CATARACT EXTRACTION W/PHACO Left 02/07/2016   Procedure: CATARACT EXTRACTION PHACO AND INTRAOCULAR LENS PLACEMENT (IOC);  Surgeon: Steven Dingeldein, MD;  Location: [facility] ORS;  Service: Ophthalmology;  Laterality: Left;  US  01:33 AP% 24.7 CDE 39.57 fluid pack lot # [id number]   EXTRACORPOREAL SHOCK WAVE LITHOTRIPSY Right 07/03/2019   Procedure: EXTRACORPOREAL SHOCK WAVE LITHOTRIPSY (ESWL);  Surgeon: Rea Cambridge, MD;  Location: [facility] ORS;  Service: Urology;  Laterality: Right;   EYE SURGERY     HIP ARTHROPLASTY Left 04/20/2020   Procedure:  ARTHROPLASTY BIPOLAR HIP (HEMIARTHROPLASTY);  Surgeon: Molli Angelucci, MD;  Location: [facility] ORS;  Service: Orthopedics;  Laterality: Left;   LITHOTRIPSY     TONSILLECTOMY      HEMATOLOGY/ONCOLOGY HISTORY:  Oncology History Overview Note  # LYMPHOCYTOSIS [25,K]/ANEMIA 9-10]; platelets-N; IgVH Somatic Hypermutation was not detected.56% OF NUCLEI POSITIVE FOR A 13Q DELETION; The phenotype is most consistent with a diagnosis of 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 Low level hypermetabolism consistent with CLL.2. Splenomegaly but no focal splenic lesions.  # April 2022- 13 Lindsay [name]; Lindsay [name]- UNMUTATED  # MID April 2022- IBRUTINIB  280 mg x3 days [STOPPED sec ER/hypertensive emergency- 191/129]  # MAY 26th, 2022- start acalabrutinib  100 mg once a day [reduced dose]; intermittent; discontinued  September mid 2022. [Muscle cramps.]  # MAY 1st week- 2024- Zanubrutinib  80 mg; stopped after 1-2 weeks- sec to acute renal failure hyperkalemia/extreme fatigue etc.  Patient declined further therapies.       CLL (chronic lymphocytic leukemia) (HCC)  02/14/2021 Initial Diagnosis   CLL (chronic lymphocytic leukemia) (HCC)   04/07/2021 Cancer Staging   Staging form: Chronic Lymphocytic Leukemia / Small Lymphocytic Lymphoma, AJCC 8th Edition - Clinical: Modified Rai Stage III (Modified Rai risk: High, Binet: Stage C) - Signed by Gwyn Leos, MD on 04/07/2021 Stage prefix: Initial diagnosis   12/16/2021 - 12/16/2021 Chemotherapy   Patient is on Treatment Plan : CLL - Rituximab q 4 weeks  ALLERGIES:  is allergic to gabapentin and demerol [meperidine].  MEDICATIONS:  Current Outpatient Medications  Medication Sig Dispense Refill   ondansetron  (ZOFRAN ) 8 MG tablet Take 1 tablet (8 mg total) by mouth every 8 (eight) hours as needed for nausea or vomiting. 20  tablet 0   amitriptyline  (ELAVIL ) 10 MG tablet Take 20 mg by mouth at bedtime.     cyanocobalamin  (,VITAMIN B-12,) 1000 MCG/ML injection Inject into the muscle every 14 (fourteen) days. Next injection 4/16     cyanocobalamin  (VITAMIN B12) 1000 MCG tablet Take 1,000 mcg by mouth daily.     DULoxetine  (CYMBALTA ) 20 MG capsule Take 20 mg by mouth daily.     Ferrous Sulfate 28 MG TABS Take 28 mg by mouth daily.     furosemide  (LASIX ) 20 MG tablet TAKE TWO TABLETS (40 MG TOTAL) BY MOUTH DAILY. 60 tablet 5   MELATONIN GUMMIES PO Take 3 mg by mouth at bedtime as needed.     oxyCODONE -acetaminophen  (PERCOCET/ROXICET) 5-325 MG tablet Take 1-2 tablets by mouth every 4 (four) hours as needed for severe pain (pain score 7-10). 60 tablet 0   polyethylene glycol powder (GLYCOLAX /MIRALAX ) 17 GM/SCOOP powder Take 17 g by mouth 2 (two) times daily as needed for moderate constipation. 255 g 0   No current facility-administered medications for this visit.    VITAL SIGNS: There were no vitals taken for this visit. There were no vitals filed for this visit.   Estimated body mass index is 15.79 kg/m as calculated from the following:   Height as of an earlier encounter on 05/26/24: 5\' 4"  (1.626 m).   Weight as of an earlier encounter on 05/26/24: 92 lb (41.7 kg).  LABS: CBC:    Component Value Date/Time   WBC 119.2 (HH) 05/26/2024 1438   WBC 132.7 (HH) 04/13/2024 1123   HGB 6.6 (LL) 05/26/2024 1438   HGB 14.1 03/06/2014 2049   HCT 22.3 (L) 05/26/2024 1438   HCT 42.9 03/06/2014 2049   PLT 135 (L) 05/26/2024 1438   PLT 240 03/06/2014 2049   MCV 102.3 (H) 05/26/2024 1438   MCV 88 03/06/2014 2049   NEUTROABS 3.6 05/26/2024 1438   NEUTROABS 7.3 (H) 03/06/2014 2049   LYMPHSABS 110.6 (H) 05/26/2024 1438   LYMPHSABS 6.6 (H) 03/06/2014 2049   MONOABS 3.4 (H) 05/26/2024 1438   MONOABS 1.3 (H) 03/06/2014 2049   EOSABS 1.3 (H) 05/26/2024 1438   EOSABS 0.4 03/06/2014 2049   BASOSABS 0.1 05/26/2024 1438    BASOSABS 0.1 03/06/2014 2049   Comprehensive Metabolic Panel:    Component Value Date/Time   NA 139 05/26/2024 1438   NA 138 09/04/2023 1154   NA 138 03/06/2014 2049   K 4.6 05/26/2024 1438   K 3.6 03/06/2014 2049   CL 110 05/26/2024 1438   CL 107 03/06/2014 2049   CO2 20 (L) 05/26/2024 1438   CO2 29 03/06/2014 2049   BUN 61 (H) 05/26/2024 1438   BUN 58 (H) 09/04/2023 1154   BUN 14 03/06/2014 2049   CREATININE 1.79 (H) 05/26/2024 1438   CREATININE 0.75 03/06/2014 2049   GLUCOSE 101 (H) 05/26/2024 1438   GLUCOSE 103 (H) 03/06/2014 2049   CALCIUM  8.1 (L) 05/26/2024 1438   CALCIUM  8.9 03/06/2014 2049   AST 19 04/13/2024 1123   AST 15 09/25/2023 0855   ALT 8 04/13/2024 1123   ALT 8 09/25/2023 0855   ALKPHOS 107 04/13/2024 1123   BILITOT 1.0 04/13/2024 1123   BILITOT 1.1  09/25/2023 0855   PROT 6.4 (L) 04/13/2024 1123   ALBUMIN 4.0 04/13/2024 1123    RADIOGRAPHIC STUDIES: No results found.   PERFORMANCE STATUS (ECOG) : 1 - Symptomatic but completely ambulatory  Review of Systems Unless otherwise noted, a complete review of systems is negative.  Physical Exam General: NAD Pulmonary: Unlabored Extremities: no edema, no joint deformities Skin: no rashes Neurological: Weakness but otherwise nonfocal  IMPRESSION: Follow-up visit.  Patient accompanied by daughter.  Reportedly, patient doing well.  She denies any significant changes or concerns.  Reportedly, pain is stable.  Health, patient is taking 1 to 2 tablets every 4 hours around-the-clock.  We did discuss option of starting patient on a long-acting opioid to try to lessen utilization of Percocet.  Patient and daughter will think about this.  Denies any adverse effects from medications.  Patient does endorse intermittent nausea.  Will send Rx for ondansetron .  PLAN: -Continue current scope of treatment - Continue Percocet as needed for pain - Daily bowel regimen -Ondansetron  as needed for nausea - Continue  palliative care with plan for future hospice - DNR/DNI - Follow-up telephone visit 1 to 2 months   Patient expressed understanding and was in agreement with this plan. She also understands that She can call the clinic at any time with any questions, concerns, or complaints.     Time Total: 15 minutes  Visit consisted of counseling and education dealing with the complex and emotionally intense issues of symptom management and palliative care in the setting of serious and potentially life-threatening illness.Greater than 50%  of this time was spent counseling and coordinating care related to the above assessment and plan.  Signed by: Gerilyn Kobus, PhD, NP-C

## 2024-05-26 NOTE — Progress Notes (Unsigned)
 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 Low level hypermetabolism consistent with CLL.2. Splenomegaly but no focal splenic lesions.  # April 2022- 13 Q DEL; IGVH- UNMUTATED  # MID April 2022- IBRUTINIB  280 mg x3 days [STOPPED sec ER/hypertensive emergency- 191/129]  # MAY 26th, 2022- start acalabrutinib  100 mg once a day [reduced dose]; intermittent; discontinued  September mid 2022. [Muscle cramps.]  # MAY 1st week- 2024- Zanubrutinib  80 mg; stopped after 1-2 weeks- sec to acute renal failure hyperkalemia/extreme fatigue etc.  Patient declined further therapies.       CLL (chronic lymphocytic leukemia) (HCC)  02/14/2021 Initial Diagnosis   CLL (chronic lymphocytic leukemia) (HCC)   04/07/2021 Cancer Staging   Staging form: Chronic Lymphocytic Leukemia / Small Lymphocytic Lymphoma, AJCC 8th Edition - Clinical: Modified Rai Stage III (Modified Rai risk: High, Binet: Stage C) - Signed by Gwyn Leos, MD on 04/07/2021 Stage prefix: Initial diagnosis   12/16/2021 - 12/16/2021 Chemotherapy   Patient is on Treatment Plan : CLL - Rituximab q 4 weeks      HISTORY OF PRESENTING ILLNESS: Lindsay [name] patient/accompanied by daughter.  Ambulating independently.  Lindsay [name] 89 y.o.  [name] symptomatic CLL currently on on surveillance because of intolerance to  ibrutinib /acalabrutinib /zanubrutinib  is here for follow-up-currently on best supportive care with blood transfusions.  patient here for heme follow-up appointment, expresses no complaints or concerns at this time except for ongoing shortness of breath on exertion and fatigue.  Complains of poor appetite.  Positive for weight loss.   No appetite, no supplement drink.   Also noticed significant increasing size of the neck lymph nodes/underarm lymph nodes.  Review of Systems  Constitutional:  Positive for malaise/fatigue and weight loss. Negative for chills, diaphoresis and fever.  HENT:  Negative for nosebleeds and sore throat.   Eyes:  Negative for double vision.  Respiratory:  Negative for cough, hemoptysis, sputum production, shortness of breath and wheezing.   Cardiovascular:  Negative for chest pain, palpitations, orthopnea and leg swelling.  Gastrointestinal:  Negative for abdominal pain, constipation, diarrhea, heartburn, melena, nausea and vomiting.  Genitourinary:  Negative for dysuria, frequency and urgency.  Musculoskeletal:  Positive for joint pain. Negative for back pain.  Skin: Negative.  Negative for itching and rash.  Neurological:  Negative for dizziness, tingling, focal weakness, weakness and headaches.  Endo/Heme/Allergies:  Bruises/bleeds easily.  Psychiatric/Behavioral:  Negative for depression. The patient is not nervous/anxious and does not have insomnia.      MEDICAL HISTORY:  Past Medical History:  Diagnosis Date   Arthritis    RHEUMATOID   CHF (congestive heart failure) (HCC)    CLL (chronic lymphocytic leukemia) (HCC)    Edema    MILD ANKLE OCCAS   Fibromyalgia    GERD (gastroesophageal reflux disease)    Hypertension    Kidney stones    RLS (restless legs syndrome)     SURGICAL HISTORY: Past Surgical History:  Procedure Laterality Date   ABDOMINAL HYSTERECTOMY  CATARACT EXTRACTION W/PHACO Left 02/07/2016   Procedure: CATARACT EXTRACTION PHACO  AND INTRAOCULAR LENS PLACEMENT (IOC);  Surgeon: Steven Dingeldein, MD;  Location: [facility] ORS;  Service: Ophthalmology;  Laterality: Left;  US  01:33 AP% 24.7 CDE 39.57 fluid pack lot # [id number]   EXTRACORPOREAL SHOCK WAVE LITHOTRIPSY Right 07/03/2019   Procedure: EXTRACORPOREAL SHOCK WAVE LITHOTRIPSY (ESWL);  Surgeon: Rea Cambridge, MD;  Location: [facility] ORS;  Service: Urology;  Laterality: Right;   EYE SURGERY     HIP ARTHROPLASTY Left 04/20/2020   Procedure: ARTHROPLASTY BIPOLAR HIP (HEMIARTHROPLASTY);  Surgeon: Molli Angelucci, MD;  Location: [facility] ORS;  Service: Orthopedics;  Laterality: Left;   LITHOTRIPSY     TONSILLECTOMY      SOCIAL HISTORY: Social History   Socioeconomic History   Marital status: Widowed    Spouse name: Not on file   Number of children: Not on file   Years of education: Not on file   Highest education level: Not on file  Occupational History   Not on file  Tobacco Use   Smoking status: Never   Smokeless tobacco: Never  Substance and Sexual Activity   Alcohol use: No   Drug use: Never   Sexual activity: Not Currently  Other Topics Concern   Not on file  Social History Narrative   Lives with daughter, in [place]. 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    Social Drivers of Corporate investment banker Strain: Low Risk  (12/24/2023)   Received from [facility]   Overall Financial Resource Strain (CARDIA)    Difficulty of Paying Living Expenses: Not hard at all  Food Insecurity: No Food Insecurity (12/24/2023)   Received from [facility]   Hunger Vital Sign    Ran Out of Food in the Last Year: Never true    Worried About Running Out of Food in the Last Year: Never true  Transportation Needs: No Transportation Needs (12/24/2023)   Received from [facility]   PRAPARE - Transportation    Lack of Transportation (Non-Medical): No    In the past 12 months, has lack of transportation kept you from  medical appointments or from getting medications?: No  Physical Activity: Not on file  Stress: Not on file  Social Connections: Not on file  Intimate Partner Violence: Not At Risk (09/19/2023)   Humiliation, Afraid, Rape, and Kick questionnaire    Fear of Current or Ex-Partner: No    Emotionally Abused: No    Physically Abused: No    Sexually Abused: No    FAMILY HISTORY: Family History  Problem Relation Age of Onset   Cancer Mother        uterine cancer    ALLERGIES:  is allergic to gabapentin and demerol [meperidine].  MEDICATIONS:  Current Outpatient Medications  Medication Sig Dispense Refill   amitriptyline  (ELAVIL ) 10 MG tablet Take 20 mg by mouth at bedtime.     cyanocobalamin  (,VITAMIN B-12,) 1000 MCG/ML injection Inject into the muscle every 14 (fourteen) days. Next injection 4/16     cyanocobalamin  (VITAMIN B12) 1000 MCG tablet Take 1,000 mcg by mouth daily.     DULoxetine  (CYMBALTA ) 20 MG capsule Take 20 mg by mouth daily.     Ferrous Sulfate 28 MG TABS Take 28 mg by mouth daily.     furosemide  (LASIX ) 20 MG tablet TAKE TWO TABLETS (40 MG TOTAL) BY MOUTH DAILY. 60 tablet 5   MELATONIN GUMMIES PO Take 3 mg by mouth  at bedtime as needed.     oxyCODONE -acetaminophen  (PERCOCET/ROXICET) 5-325 MG tablet Take 1-2 tablets by mouth every 4 (four) hours as needed for severe pain (pain score 7-10). 60 tablet 0   polyethylene glycol powder (GLYCOLAX /MIRALAX ) 17 GM/SCOOP powder Take 17 g by mouth 2 (two) times daily as needed for moderate constipation. 255 g 0   ondansetron  (ZOFRAN ) 8 MG tablet Take 1 tablet (8 mg total) by mouth every 8 (eight) hours as needed for nausea or vomiting. 20 tablet 0   No current facility-administered medications for this visit.    PHYSICAL EXAMINATION: ECOG PERFORMANCE STATUS: 0 - Asymptomatic  Vitals:   05/26/24 1456 05/26/24 1502  BP: (!) 158/60 (!) 154/54  Pulse: 64 67  Resp: 17   Temp: 97.8 F (36.6 C)   SpO2: 98%      Filed Weights    05/26/24 1456  Weight: 92 lb (41.7 kg)   Bulky bilateral neck adenopathy; underarm adenopathy.  Physical Exam HENT:     Head: Normocephalic and atraumatic.     Mouth/Throat:     Pharynx: No oropharyngeal exudate.  Eyes:     Pupils: Pupils are equal, round, and reactive to light.  Cardiovascular:     Rate and Rhythm: Normal rate and regular rhythm.  Pulmonary:     Effort: Pulmonary effort is normal. No respiratory distress.     Breath sounds: Normal breath sounds. No wheezing.  Abdominal:     General: Bowel sounds are normal. There is no distension.     Palpations: Abdomen is soft. There is no mass.     Tenderness: There is no abdominal tenderness. There is no guarding or rebound.  Musculoskeletal:        General: No tenderness. Normal range of motion.     Cervical back: Normal range of motion and neck supple.  Skin:    General: Skin is warm.     Comments: Chronic multiple bruises noted.  Neurological:     Mental Status: She is alert and oriented to person, place, and time.  Psychiatric:        Mood and Affect: Affect normal.      LABORATORY DATA:  I have reviewed the data as listed Lab Results  Component Value Date   WBC 119.2 (HH) 05/26/2024   HGB 6.6 (LL) 05/26/2024   HCT 22.3 (L) 05/26/2024   MCV 102.3 (H) 05/26/2024   PLT 135 (L) 05/26/2024   Recent Labs    09/25/23 0855 10/09/23 0954 04/12/24 1018 04/13/24 1123 04/28/24 1446 05/26/24 1438  NA 136   < > 140 137 137 139  K 5.1   < > 4.4 4.0 4.8 4.6  CL 106   < > 111 111 108 110  CO2 22   < > 21* 20* 20* 20*  GLUCOSE 97   < > 103* 172* 122* 101*  BUN 40*   < > 62* 51* 65* 61*  CREATININE 1.51*   < > 1.75* 1.54* 1.90* 1.79*  CALCIUM  8.7*   < > 8.8* 8.4* 8.3* 8.1*  GFRNONAA 33*   < > 28* 32* 25* 27*  PROT 6.2*  --  6.0* 6.4*  --   --   ALBUMIN 4.0  --  3.8 4.0  --   --   AST 15  --  16 19  --   --   ALT 8  --  9 8  --   --   ALKPHOS 101  --  101 107  --   --  BILITOT 1.1  --  1.2 1.0  --   --     < > = values in this interval not displayed.    RADIOGRAPHIC STUDIES: I have personally reviewed the radiological images as listed and agreed with the findings in the report. No results found.   ASSESSMENT & PLAN:   CLL (chronic lymphocytic leukemia) (HCC) # CLL: [56% OF NUCLEI POSITIVE FOR A 13Q DELETION]; April 2022-PET scan shows significant lymphadenopathy above and below diaphragm; splenomegaly. Discontinued Ibrutinib  [hypertensive emergencies] acalabrutinib  [leg cramps even with minimum dose].Discontinued zanubrutinib . hypokalemia loss and renal failure/extreme fatigue etc.    # Progression of disease given the rising white count. Again declined any treatment options.   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  #OCT 2024- I sat- 11%- on  gentle iron [iron biglycinate; 28 mg ] 1 pill a day. HOLD retacrit  today-given patient's preference/lack of improvement. WBC 119; HB -6.6  PLATELETS- 130 [ok with dental work/extractions]-continue supportive transfusions.  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   #  s/p pallaitive care-as per patient wishes continue follow-up hospice.  Continue supportive care.  Discussed with Dr. Annelle Kiel.  # ACP- DNR   # DISPOSITION: # HOLD retacrit  today # 1 unit of blood tomorrow # in 2 weeks- labs- cbc/HOLD tube; D-2 possible 1 unit PRBC # follow up in 4 weeks- MD; labs- cbc/ bmp; LDH; HOLD tube; D-2- possible 1 unit PRBC- Dr.B  All questions were answered. The patient knows to call the clinic with any problems, questions or concerns.    Gwyn Leos, MD 05/28/2024 10:11 AM

## 2024-05-26 NOTE — Progress Notes (Unsigned)
 Patient here for heme follow-up appointment, expresses no complaints or concerns at this time.

## 2024-05-26 NOTE — Progress Notes (Unsigned)
 Called lab for preliminary results; Critical Labs WBC 119.2 and hgb 6.6  Readback  Dr B notified

## 2024-05-26 NOTE — Assessment & Plan Note (Signed)
#   CLL: [56% OF NUCLEI POSITIVE FOR A 13Q DELETION]; April 2022-PET scan shows significant lymphadenopathy above and below diaphragm; splenomegaly. Discontinued Ibrutinib  [hypertensive emergencies] acalabrutinib  [leg cramps even with minimum dose].Discontinued zanubrutinib . hypokalemia loss and renal failure/extreme fatigue etc.    # Progression of disease given the rising white count. Again declined any treatment options.   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  #OCT 2024- I sat- 11%- on  gentle iron [iron biglycinate; 28 mg ] 1 pill a day. HOLD retacrit  today-given patient's preference/lack of improvement. WBC 119; HB -6.6  PLATELETS- 130 [ok with dental work/extractions]-continue supportive transfusions.  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   #  s/p pallaitive care-as per patient wishes continue follow-up hospice.  Continue supportive care.  Discussed with Dr. Annelle Kiel.  # ACP- DNR   # DISPOSITION: # HOLD retacrit  today # 1 unit of blood tomorrow # in 2 weeks- labs- cbc/HOLD tube; D-2 possible 1 unit PRBC # follow up in 4 weeks- MD; labs- cbc/ bmp; LDH; HOLD tube; D-2- possible 1 unit PRBC- Dr.B

## 2024-05-27 ENCOUNTER — Inpatient Hospital Stay

## 2024-05-27 DIAGNOSIS — C9110 Chronic lymphocytic leukemia of B-cell type not having achieved remission: Secondary | ICD-10-CM

## 2024-05-27 DIAGNOSIS — D649 Anemia, unspecified: Secondary | ICD-10-CM

## 2024-05-27 LAB — PREPARE RBC (CROSSMATCH)

## 2024-05-27 MED ORDER — SODIUM CHLORIDE 0.9% IV SOLUTION
250.0000 mL | INTRAVENOUS | Status: DC
  Administered 2024-05-27: 100 mL via INTRAVENOUS
  Filled 2024-05-27: qty 250

## 2024-05-27 MED ORDER — ACETAMINOPHEN 325 MG PO TABS
650.0000 mg | ORAL_TABLET | Freq: Once | ORAL | Status: AC
  Administered 2024-05-27: 650 mg via ORAL
  Filled 2024-05-27: qty 2

## 2024-05-27 MED ORDER — DIPHENHYDRAMINE HCL 25 MG PO CAPS
25.0000 mg | ORAL_CAPSULE | Freq: Once | ORAL | Status: AC
  Administered 2024-05-27: 25 mg via ORAL
  Filled 2024-05-27: qty 1

## 2024-05-28 ENCOUNTER — Encounter: Payer: Self-pay | Admitting: Internal Medicine

## 2024-05-28 LAB — TYPE AND SCREEN
ABO/RH(D): O POS
Antibody Screen: POSITIVE
DAT, IgG: POSITIVE
DAT, complement: POSITIVE
Donor AG Type: NEGATIVE
Unit division: 0

## 2024-05-28 LAB — BPAM RBC
Blood Product Expiration Date: 202507072359
ISSUE DATE / TIME: 202506100944
Unit Type and Rh: 5100

## 2024-05-29 ENCOUNTER — Ambulatory Visit: Admitting: Podiatry

## 2024-05-29 ENCOUNTER — Encounter: Payer: Self-pay | Admitting: Podiatry

## 2024-05-29 DIAGNOSIS — M79676 Pain in unspecified toe(s): Secondary | ICD-10-CM | POA: Diagnosis not present

## 2024-05-29 DIAGNOSIS — B351 Tinea unguium: Secondary | ICD-10-CM

## 2024-05-30 ENCOUNTER — Other Ambulatory Visit: Payer: Self-pay | Admitting: Hospice and Palliative Medicine

## 2024-06-05 NOTE — Progress Notes (Signed)
  Subjective:  Patient ID: Lindsay [name], female    DOB: 09-12-1934,  MRN: [id number]  Lindsay [name] presents to clinic today for painful elongated mycotic toenails 1-5 bilaterally which are tender when wearing enclosed shoe gear. Pain is relieved with periodic professional debridement.   New problem(s): None.   PCP is Lyle San, MD. Holli Lunger 02/18/2024.  Allergies  Allergen Reactions   Gabapentin Swelling    05/01/23: Pt stated she has facial swelling when taking Gabapentin.   Demerol [Meperidine]     Review of Systems: Negative except as noted in the HPI.  Objective: No changes noted in today's physical examination. There were no vitals filed for this visit. Lindsay [name] is a pleasant 89 y.o. female WD, WN in NAD. AAO x 3.  Vascular Examination: Capillary refill time immediate b/l. Palpable pedal pulses. Pedal hair present b/l. No pain with calf compression b/l. Skin temperature gradient WNL b/l. No cyanosis or clubbing b/l. No ischemia or gangrene noted b/l. No edema noted b/l LE.  Neurological Examination: Sensation grossly intact b/l with 10 gram monofilament. Vibratory sensation intact b/l.   Dermatological Examination: Pedal skin with normal turgor, texture and tone b/l.  No open wounds. No interdigital macerations.   Toenails bilateral great toes b/l thick, discolored, elongated with subungual debris and pain on dorsal palpation.   Nondystrophic toenails 2-5 bilaterally.  Musculoskeletal Examination: Muscle strength 5/5 to all lower extremity muscle groups bilaterally. No pain, crepitus or joint limitation noted with ROM b/l LE. No gross bony pedal deformities b/l. Patient ambulates independently without assistive aids.  Radiographs: None  Last A1c:      Latest Ref Rng & Units 06/18/2023    9:41 AM  Hemoglobin A1C  Hemoglobin-A1c 4.8 - 5.6 % 5.5    Assessment/Plan: 1. Pain due to onychomycosis of toenail   -Consent given for treatment as described  below: -Examined patient. -Patient to continue soft, supportive shoe gear daily. -Mycotic toenails bilateral great toes were debrided in length and girth with sterile nail nippers and dremel without iatrogenic bleeding. -Nondystrophic toenails trimmed 2-5 bilaterally. -Patient/POA to call should there be question/concern in the interim.   Return in about 6 months (around 11/28/2024).  Luella Sager, DPM      [place] LOCATION: [address], [place] [address]                   Office [phone]   [facility] LOCATION: [address] [place], [place] [address] Office [phone]

## 2024-06-06 ENCOUNTER — Telehealth: Payer: Self-pay | Admitting: *Deleted

## 2024-06-06 DIAGNOSIS — K625 Hemorrhage of anus and rectum: Secondary | ICD-10-CM

## 2024-06-06 DIAGNOSIS — D649 Anemia, unspecified: Secondary | ICD-10-CM

## 2024-06-06 DIAGNOSIS — C9110 Chronic lymphocytic leukemia of B-cell type not having achieved remission: Secondary | ICD-10-CM

## 2024-06-06 NOTE — Telephone Encounter (Signed)
 The daughter said that she is having a little bit of rectal bleeding, very much but did a couple times.  I spoke to [facility] and he said we should have her come in on Monday labs at 10:00 10:30 for to see the doctor.  I called back to the daughter and she says that she has never had any hemorrhoids but she has been straining today.  I told her that it is probably that that is made that little bit of bleeding.  She can get some Aquaphor over-the-counter and put a glove on put some on around the outside of the rectal do not put anything going in.

## 2024-06-09 ENCOUNTER — Other Ambulatory Visit

## 2024-06-09 ENCOUNTER — Other Ambulatory Visit: Payer: Self-pay | Admitting: *Deleted

## 2024-06-09 ENCOUNTER — Inpatient Hospital Stay (HOSPITAL_BASED_OUTPATIENT_CLINIC_OR_DEPARTMENT_OTHER): Admitting: Nurse Practitioner

## 2024-06-09 ENCOUNTER — Other Ambulatory Visit: Payer: Self-pay | Admitting: Hospice and Palliative Medicine

## 2024-06-09 ENCOUNTER — Encounter: Payer: Self-pay | Admitting: Nurse Practitioner

## 2024-06-09 ENCOUNTER — Inpatient Hospital Stay

## 2024-06-09 VITALS — BP 131/35 | HR 81 | Temp 97.4°F | Resp 14 | Wt 94.2 lb

## 2024-06-09 DIAGNOSIS — K625 Hemorrhage of anus and rectum: Secondary | ICD-10-CM

## 2024-06-09 DIAGNOSIS — C9110 Chronic lymphocytic leukemia of B-cell type not having achieved remission: Secondary | ICD-10-CM

## 2024-06-09 DIAGNOSIS — D649 Anemia, unspecified: Secondary | ICD-10-CM

## 2024-06-09 LAB — CBC WITH DIFFERENTIAL/PLATELET
Abs Immature Granulocytes: 0.46 10*3/uL — ABNORMAL HIGH (ref 0.00–0.07)
Basophils Absolute: 0.1 10*3/uL (ref 0.0–0.1)
Basophils Relative: 0 %
Eosinophils Absolute: 1.8 10*3/uL — ABNORMAL HIGH (ref 0.0–0.5)
Eosinophils Relative: 2 %
HCT: 20.3 % — ABNORMAL LOW (ref 36.0–46.0)
Hemoglobin: 5.9 g/dL — CL (ref 12.0–15.0)
Immature Granulocytes: 0 %
Lymphocytes Relative: 91 %
Lymphs Abs: 104.5 10*3/uL — ABNORMAL HIGH (ref 0.7–4.0)
MCH: 29.4 pg (ref 26.0–34.0)
MCHC: 29.1 g/dL — ABNORMAL LOW (ref 30.0–36.0)
MCV: 101 fL — ABNORMAL HIGH (ref 80.0–100.0)
Monocytes Absolute: 2.9 10*3/uL — ABNORMAL HIGH (ref 0.1–1.0)
Monocytes Relative: 3 %
Neutro Abs: 4.4 10*3/uL (ref 1.7–7.7)
Neutrophils Relative %: 4 %
Platelets: 134 10*3/uL — ABNORMAL LOW (ref 150–400)
RBC: 2.01 MIL/uL — ABNORMAL LOW (ref 3.87–5.11)
RDW: 24.1 % — ABNORMAL HIGH (ref 11.5–15.5)
WBC: 114.2 10*3/uL (ref 4.0–10.5)
nRBC: 0 % (ref 0.0–0.2)

## 2024-06-09 LAB — COMPREHENSIVE METABOLIC PANEL WITH GFR
ALT: 9 U/L (ref 0–44)
AST: 16 U/L (ref 15–41)
Albumin: 3.9 g/dL (ref 3.5–5.0)
Alkaline Phosphatase: 108 U/L (ref 38–126)
Anion gap: 11 (ref 5–15)
BUN: 72 mg/dL — ABNORMAL HIGH (ref 8–23)
CO2: 20 mmol/L — ABNORMAL LOW (ref 22–32)
Calcium: 8.2 mg/dL — ABNORMAL LOW (ref 8.9–10.3)
Chloride: 105 mmol/L (ref 98–111)
Creatinine, Ser: 2 mg/dL — ABNORMAL HIGH (ref 0.44–1.00)
GFR, Estimated: 24 mL/min — ABNORMAL LOW (ref >60)
Glucose, Bld: 165 mg/dL — ABNORMAL HIGH (ref 70–99)
Potassium: 4.5 mmol/L (ref 3.5–5.1)
Sodium: 136 mmol/L (ref 135–145)
Total Bilirubin: 0.9 mg/dL (ref 0.0–1.2)
Total Protein: 5.9 g/dL — ABNORMAL LOW (ref 6.5–8.1)

## 2024-06-09 NOTE — Progress Notes (Signed)
 Symptom Management Clinic  [facility] at [facility] A Department of the [place]. [facility] [address], [address] [place], [place] [address] [phone] (phone) [phone] (fax)  Patient Care Team: Valora Agent, MD as PCP - General (Family Medicine) Rennie Cindy SAUNDERS, MD as Consulting Physician (Internal Medicine)   Name of the patient: Lindsay [name]  [id number]  [birth year]   Date of visit: 06/09/24  Diagnosis- CLL  Chief complaint/ Reason for visit- Weakness & lethargy  Heme/Onc history:  Oncology History Overview Note  # LYMPHOCYTOSIS [25,K]/ANEMIA 9-10]; platelets-N; IgVH Somatic Hypermutation was not detected.56% OF NUCLEI POSITIVE FOR A 13Q DELETION; The phenotype is most consistent with a diagnosis of 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 Low level hypermetabolism consistent with CLL.2. Splenomegaly but no focal splenic lesions.  # April 2022- 13 Q DEL; IGVH- UNMUTATED  # MID April 2022- IBRUTINIB  280 mg x3 days [STOPPED sec ER/hypertensive emergency- 191/129]  # MAY 26th, 2022- start acalabrutinib  100 mg once a day [reduced dose]; intermittent; discontinued  September mid 2022. [Muscle cramps.]  # MAY 1st week- 2024- Zanubrutinib  80 mg; stopped after 1-2 weeks- sec to acute renal failure hyperkalemia/extreme fatigue etc.  Patient declined further therapies.       CLL (chronic lymphocytic leukemia) (HCC)  02/14/2021 Initial Diagnosis   CLL (chronic lymphocytic leukemia) (HCC)   04/07/2021 Cancer Staging   Staging form: Chronic Lymphocytic Leukemia / Small Lymphocytic Lymphoma, AJCC 8th Edition - Clinical: Modified Rai Stage III (Modified Rai risk: High, Binet: Stage C) - Signed by Rennie Cindy SAUNDERS, MD on 04/07/2021 Stage prefix: Initial diagnosis   12/16/2021 - 12/16/2021  Chemotherapy   Patient is on Treatment Plan : CLL - Rituximab q 4 weeks      Interval History- Lindsay [name] is a 89 y.o. female who returns to clinic for follow up of her CLL. She is feeling increasingly cold and fatigued. Becomes short of breath with exertion. Daughter questions if she needs blood. Previously had intolerances to treatment for her CLL and is reluctant to any future treatments.   Review of systems- Review of Systems  Constitutional:  Positive for malaise/fatigue. Negative for chills, fever and weight loss.  HENT:  Negative for hearing loss, nosebleeds, sore throat and tinnitus.   Eyes:  Negative for blurred vision and double vision.  Respiratory:  Positive for shortness of breath. Negative for cough, hemoptysis and wheezing.   Cardiovascular:  Negative for chest pain, palpitations and leg swelling.  Gastrointestinal:  Negative for abdominal pain, blood in stool, constipation, diarrhea, melena, nausea and vomiting.  Genitourinary:  Negative for dysuria and urgency.  Musculoskeletal:  Negative for back pain, falls, joint pain and myalgias.  Skin:  Negative for itching and rash.  Neurological:  Positive for weakness. Negative for dizziness, tingling, sensory change, loss of consciousness and headaches.  Endo/Heme/Allergies:  Negative for environmental allergies. Does not bruise/bleed easily.  Psychiatric/Behavioral:  Negative for depression. The patient is not nervous/anxious and does not have insomnia.     Allergies  Allergen Reactions   Gabapentin Swelling    05/01/23: Pt stated she has facial swelling when taking Gabapentin.   Demerol [Meperidine]    Past Medical History:  Diagnosis Date   Arthritis    RHEUMATOID   CHF (congestive heart failure) (HCC)    CLL (chronic lymphocytic leukemia) (HCC)    Edema  MILD ANKLE OCCAS   Fibromyalgia    GERD (gastroesophageal reflux disease)    Hypertension    Kidney stones    RLS (restless legs syndrome)    Past  Surgical History:  Procedure Laterality Date   ABDOMINAL HYSTERECTOMY     CATARACT EXTRACTION W/PHACO Left 02/07/2016   Procedure: CATARACT EXTRACTION PHACO AND INTRAOCULAR LENS PLACEMENT (IOC);  Surgeon: Steven Dingeldein, MD;  Location: [facility] ORS;  Service: Ophthalmology;  Laterality: Left;  US  01:33 AP% 24.7 CDE 39.57 fluid pack lot # [id number]   EXTRACORPOREAL SHOCK WAVE LITHOTRIPSY Right 07/03/2019   Procedure: EXTRACORPOREAL SHOCK WAVE LITHOTRIPSY (ESWL);  Surgeon: Kassie Ozell SAUNDERS, MD;  Location: [facility] ORS;  Service: Urology;  Laterality: Right;   EYE SURGERY     HIP ARTHROPLASTY Left 04/20/2020   Procedure: ARTHROPLASTY BIPOLAR HIP (HEMIARTHROPLASTY);  Surgeon: Kathlynn Ozell, MD;  Location: [facility] ORS;  Service: Orthopedics;  Laterality: Left;   LITHOTRIPSY     TONSILLECTOMY     Social History   Socioeconomic History   Marital status: Widowed    Spouse name: Not on file   Number of children: Not on file   Years of education: Not on file   Highest education level: Not on file  Occupational History   Not on file  Tobacco Use   Smoking status: Never   Smokeless tobacco: Never  Substance and Sexual Activity   Alcohol use: No   Drug use: Never   Sexual activity: Not Currently  Other Topics Concern   Not on file  Social History Narrative   Lives with daughter, in [place]. 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    Social Drivers of Corporate investment banker Strain: Low Risk  (12/24/2023)   Received from [facility]   Overall Financial Resource Strain (CARDIA)    Difficulty of Paying Living Expenses: Not hard at all  Food Insecurity: No Food Insecurity (12/24/2023)   Received from [facility]   Hunger Vital Sign    Within the past 12 months, the food you bought just didn't last and you didn't have money to get more.: Never true    Within the past 12 months, you worried that your food would run out before you got the money to buy  more.: Never true  Transportation Needs: No Transportation Needs (12/24/2023)   Received from [facility]   PRAPARE - Transportation    Lack of Transportation (Non-Medical): No    In the past 12 months, has lack of transportation kept you from medical appointments or from getting medications?: No  Physical Activity: Not on file  Stress: Not on file  Social Connections: Not on file  Intimate Partner Violence: Not At Risk (09/19/2023)   Humiliation, Afraid, Rape, and Kick questionnaire    Fear of Current or Ex-Partner: No    Emotionally Abused: No    Physically Abused: No    Sexually Abused: No   Family History  Problem Relation Age of Onset   Cancer Mother        uterine cancer   Current Outpatient Medications:    amitriptyline  (ELAVIL ) 10 MG tablet, Take 20 mg by mouth at bedtime., Disp: , Rfl:    cyanocobalamin  (,VITAMIN B-12,) 1000 MCG/ML injection, Inject into the muscle every 14 (fourteen) days. Next injection 4/16, Disp: , Rfl:    cyanocobalamin  (VITAMIN B12) 1000 MCG tablet, Take 1,000 mcg by mouth daily., Disp: , Rfl:    DULoxetine  (CYMBALTA ) 20 MG  capsule, Take 20 mg by mouth daily., Disp: , Rfl:    Ferrous Sulfate 28 MG TABS, Take 28 mg by mouth daily., Disp: , Rfl:    furosemide  (LASIX ) 20 MG tablet, TAKE TWO TABLETS (40 MG TOTAL) BY MOUTH DAILY., Disp: 60 tablet, Rfl: 5   MELATONIN GUMMIES PO, Take 3 mg by mouth at bedtime as needed., Disp: , Rfl:    ondansetron  (ZOFRAN ) 8 MG tablet, Take 1 tablet (8 mg total) by mouth every 8 (eight) hours as needed for nausea or vomiting., Disp: 20 tablet, Rfl: 0   oxyCODONE -acetaminophen  (PERCOCET/ROXICET) 5-325 MG tablet, Take 1-2 tablets by mouth every 4 (four) hours as needed for severe pain (pain score 7-10)., Disp: 90 tablet, Rfl: 0   polyethylene glycol powder (GLYCOLAX /MIRALAX ) 17 GM/SCOOP powder, Take 17 g by mouth 2 (two) times daily as needed for moderate constipation., Disp: 255 g, Rfl: 0  Physical exam:   Vitals:   06/09/24 1022  BP: (!) 131/35  Pulse: 81  Resp: 14  Temp: (!) 97.4 F (36.3 C)  TempSrc: Tympanic  SpO2: 97%  Weight: 94 lb 3.2 oz (42.7 kg)   Physical Exam Vitals reviewed.  Constitutional:      Appearance: She is not ill-appearing.  Pulmonary:     Effort: Pulmonary effort is normal.  Abdominal:     Tenderness: There is no abdominal tenderness.   Musculoskeletal:        General: No deformity.  Lymphadenopathy:     Cervical: Cervical adenopathy present.   Skin:    Coloration: Skin is pale.   Neurological:     Mental Status: She is alert and oriented to person, place, and time.       Latest Ref Rng & Units 06/09/2024   10:06 AM  CMP  Glucose 70 - 99 mg/dL 834   BUN 8 - 23 mg/dL 72   Creatinine 9.55 - 1.00 mg/dL 7.99   Sodium 864 - 854 mmol/L 136   Potassium 3.5 - 5.1 mmol/L 4.5   Chloride 98 - 111 mmol/L 105   CO2 22 - 32 mmol/L 20   Calcium  8.9 - 10.3 mg/dL 8.2   Total Protein 6.5 - 8.1 g/dL 5.9   Total Bilirubin 0.0 - 1.2 mg/dL 0.9   Alkaline Phos 38 - 126 U/L 108   AST 15 - 41 U/L 16   ALT 0 - 44 U/L 9       Latest Ref Rng & Units 05/26/2024    2:38 PM  CBC  WBC 4.0 - 10.5 K/uL 119.2   Hemoglobin 12.0 - 15.0 g/dL 6.6   Hematocrit 63.9 - 46.0 % 22.3   Platelets 150 - 400 K/uL 135    No results found.  Assessment and plan- Patient is a 89 y.o. female   CLL (chronic lymphocytic leukemia)  # CLL: [56% OF NUCLEI POSITIVE FOR A 13Q DELETION]- April 2022- PET scan shows significant lymphadenopathy above and below diaphragm; splenomegaly. Discontinued Ibrutinib  [hypertensive emergencies] acalabrutinib  [leg cramps even with minimum dose]. Discontinued zanubrutinib . hypokalemia loss and renal failure/extreme fatigue etc.     # Progression of disease given the rising white count. Again declined any treatment options/Dr Brahmanday.    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 Long discussion today regarding pathophysiology of CLL and how it contributes to anemia  and other cytopenias. On gentle iron. Retacrit  held. May reconsider.  Hemoglobin today is 5.9.  Worse.  Recommend transfusion with 2 units of PRBCs.   # Cardiac -history of  CHF / elevated blood pressure. Continue home monitoring. Plan for lasix  with transfusions.    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    #  s/p pallaitive care- as per patient wishes continue follow-up hospice. Continue supportive care.   Disposition:  Blood transfusion tomorrow- 2 units. Plan for lab, see me with poss blood on D2 on 6/30.  Plan for weekly lab, possible transfusion on D2.  Follow up with Dr Rennie    Visit Diagnosis 1. Symptomatic anemia   2. CLL (chronic lymphocytic leukemia) (HCC)    Patient expressed understanding and was in agreement with this plan. She also understands that She can call clinic at any time with any questions, concerns, or complaints.   Thank you for allowing me to participate in the care of this very pleasant patient.   Tinnie Dawn, DNP, AGNP-C, AOCNP [facility] at [facility] [phone]

## 2024-06-10 ENCOUNTER — Ambulatory Visit

## 2024-06-10 ENCOUNTER — Inpatient Hospital Stay

## 2024-06-10 DIAGNOSIS — C9110 Chronic lymphocytic leukemia of B-cell type not having achieved remission: Secondary | ICD-10-CM

## 2024-06-10 DIAGNOSIS — D649 Anemia, unspecified: Secondary | ICD-10-CM

## 2024-06-10 LAB — PREPARE RBC (CROSSMATCH)

## 2024-06-10 MED ORDER — ACETAMINOPHEN 325 MG PO TABS
650.0000 mg | ORAL_TABLET | Freq: Once | ORAL | Status: AC
  Administered 2024-06-10: 650 mg via ORAL

## 2024-06-10 MED ORDER — DIPHENHYDRAMINE HCL 25 MG PO CAPS
25.0000 mg | ORAL_CAPSULE | Freq: Once | ORAL | Status: AC
  Administered 2024-06-10: 25 mg via ORAL

## 2024-06-10 MED ORDER — SODIUM CHLORIDE 0.9% IV SOLUTION
250.0000 mL | INTRAVENOUS | Status: DC
  Administered 2024-06-10: 100 mL via INTRAVENOUS
  Filled 2024-06-10: qty 250

## 2024-06-11 ENCOUNTER — Emergency Department

## 2024-06-11 ENCOUNTER — Telehealth: Payer: Self-pay | Admitting: Oncology

## 2024-06-11 ENCOUNTER — Emergency Department
Admission: EM | Admit: 2024-06-11 | Discharge: 2024-06-12 | Disposition: A | Discharge status: Home / Self Care | Attending: Emergency Medicine | Admitting: Emergency Medicine

## 2024-06-11 ENCOUNTER — Other Ambulatory Visit: Payer: Self-pay

## 2024-06-11 ENCOUNTER — Inpatient Hospital Stay

## 2024-06-11 DIAGNOSIS — Z856 Personal history of leukemia: Secondary | ICD-10-CM | POA: Insufficient documentation

## 2024-06-11 DIAGNOSIS — I110 Hypertensive heart disease with heart failure: Secondary | ICD-10-CM | POA: Insufficient documentation

## 2024-06-11 DIAGNOSIS — R600 Localized edema: Secondary | ICD-10-CM | POA: Insufficient documentation

## 2024-06-11 DIAGNOSIS — I509 Heart failure, unspecified: Secondary | ICD-10-CM | POA: Insufficient documentation

## 2024-06-11 DIAGNOSIS — M7989 Other specified soft tissue disorders: Secondary | ICD-10-CM | POA: Diagnosis present

## 2024-06-11 DIAGNOSIS — C9110 Chronic lymphocytic leukemia of B-cell type not having achieved remission: Secondary | ICD-10-CM | POA: Diagnosis not present

## 2024-06-11 LAB — BPAM RBC
Blood Product Expiration Date: 202507212359
Blood Product Unit Number: 202507212359
ISSUE DATE / TIME: 202506240830
PRODUCT CODE: 202506241018
PRODUCT CODE: 202507212359
Unit Type and Rh: 5100
Unit Type and Rh: 202507212359
Unit Type and Rh: 5100
Unit Type and Rh: 5100

## 2024-06-11 LAB — COMPREHENSIVE METABOLIC PANEL WITH GFR
ALT: 10 U/L (ref 0–44)
AST: 19 U/L (ref 15–41)
Albumin: 4.1 g/dL (ref 3.5–5.0)
Alkaline Phosphatase: 109 U/L (ref 38–126)
Anion gap: 11 (ref 5–15)
BUN: 74 mg/dL — ABNORMAL HIGH (ref 8–23)
CO2: 22 mmol/L (ref 22–32)
Calcium: 8.3 mg/dL — ABNORMAL LOW (ref 8.9–10.3)
Chloride: 106 mmol/L (ref 98–111)
Creatinine, Ser: 1.91 mg/dL — ABNORMAL HIGH (ref 0.44–1.00)
GFR, Estimated: 25 mL/min — ABNORMAL LOW (ref >60)
Glucose, Bld: 105 mg/dL — ABNORMAL HIGH (ref 70–99)
Potassium: 4.5 mmol/L (ref 3.5–5.1)
Sodium: 139 mmol/L (ref 135–145)
Total Bilirubin: 0.9 mg/dL (ref 0.0–1.2)
Total Protein: 6.4 g/dL — ABNORMAL LOW (ref 6.5–8.1)

## 2024-06-11 LAB — CBC WITH DIFFERENTIAL/PLATELET
Abs Immature Granulocytes: 0.28 10*3/uL — ABNORMAL HIGH (ref 0.00–0.07)
Basophils Absolute: 0.1 10*3/uL (ref 0.0–0.1)
Basophils Relative: 0 %
Eosinophils Absolute: 1.8 10*3/uL — ABNORMAL HIGH (ref 0.0–0.5)
Eosinophils Relative: 1 %
HCT: 28.7 % — ABNORMAL LOW (ref 36.0–46.0)
Hemoglobin: 8.9 g/dL — ABNORMAL LOW (ref 12.0–15.0)
Immature Granulocytes: 0 %
Lymphocytes Relative: 93 %
Lymphs Abs: 117.4 10*3/uL — ABNORMAL HIGH (ref 0.7–4.0)
MCH: 29.9 pg (ref 26.0–34.0)
MCHC: 31 g/dL (ref 30.0–36.0)
MCV: 96.3 fL (ref 80.0–100.0)
Monocytes Absolute: 1.9 10*3/uL — ABNORMAL HIGH (ref 0.1–1.0)
Monocytes Relative: 2 %
Neutro Abs: 4.9 10*3/uL (ref 1.7–7.7)
Neutrophils Relative %: 4 %
Platelets: 146 10*3/uL — ABNORMAL LOW (ref 150–400)
RBC: 2.98 MIL/uL — ABNORMAL LOW (ref 3.87–5.11)
RDW: 19.9 % — ABNORMAL HIGH (ref 11.5–15.5)
Smear Review: NORMAL
WBC: 126.3 10*3/uL (ref 4.0–10.5)
nRBC: 0 % (ref 0.0–0.2)

## 2024-06-11 LAB — TYPE AND SCREEN
ABO/RH(D): O POS
Antibody Screen: POSITIVE
DAT, IgG: POSITIVE
DAT, complement: POSITIVE
Donor AG Type: NEGATIVE
Unit division: 0
Unit division: 0
Unit division: 0

## 2024-06-11 MED ORDER — FUROSEMIDE 10 MG/ML IJ SOLN
40.0000 mg | Freq: Once | INTRAMUSCULAR | Status: AC
  Administered 2024-06-11: 40 mg via INTRAVENOUS
  Filled 2024-06-11: qty 4

## 2024-06-11 NOTE — ED Provider Notes (Signed)
 [facility] Provider Note    Event Date/Time   First MD Initiated Contact with Patient 06/11/24 2302     (approximate)   History   Shortness of Breath   HPI  Lindsay [name] is a 89 y.o. female with a history of CHF, CLL, hypertension and GERD who presents with leg swelling, acute onset today, bilateral, and not associate with any significant pain or redness.  She had some shortness of breath earlier but this has resolved.  The patient states that she was transfused 2 units of blood for her CLL yesterday and sometimes has swelling afterwards, but this time it was delayed.  She takes 40 mg of Lasix  IV daily, and took this today.  However the daughter called the oncologist and was instructed to come into the ED for possible IV Lasix .  I reviewed the past medical records per the patient's most recent outpatient encounter was with oncology on 6/23 for follow-up of her CLL, at which time medication changes were made.  There was progression of the disease with a rising white count.   Physical Exam   Triage Vital Signs: ED Triage Vitals  Encounter Vitals Group     BP 06/11/24 2220 (!) 160/56     Girls Systolic BP Percentile --      Girls Diastolic BP Percentile --      Boys Systolic BP Percentile --      Boys Diastolic BP Percentile --      Pulse Rate 06/11/24 2220 82     Resp 06/11/24 2220 18     Temp 06/11/24 2220 99.1 F (37.3 C)     Temp src --      SpO2 06/11/24 2220 99 %     Weight 06/11/24 2219 95 lb (43.1 kg)     Height 06/11/24 2219 5' 3 (1.6 m)     Head Circumference --      Peak Flow --      Pain Score 06/11/24 2219 2     Pain Loc --      Pain Education --      Exclude from Growth Chart --     Most recent vital signs: Vitals:   06/11/24 2330 06/12/24 0000  BP: (!) 152/56 (!) 161/45  Pulse: 77 78  Resp: 18 19  Temp:    SpO2: 100% 97%     General: Alert, well-appearing for age, no distress.  CV:  Good peripheral perfusion.   Resp:  Normal effort.  Lungs CTAB. Abd:  No distention.  Other:  1+ bilateral lower extremity pitting edema.  No erythema, induration, or abnormal warmth.   ED Results / Procedures / Treatments   Labs (all labs ordered are listed, but only abnormal results are displayed) Labs Reviewed  CBC WITH DIFFERENTIAL/PLATELET - Abnormal; Notable for the following components:      Result Value   WBC 126.3 (*)    RBC 2.98 (*)    Hemoglobin 8.9 (*)    HCT 28.7 (*)    RDW 19.9 (*)    Platelets 146 (*)    Lymphs Abs 117.4 (*)    Monocytes Absolute 1.9 (*)    Eosinophils Absolute 1.8 (*)    Abs Immature Granulocytes 0.28 (*)    All other components within normal limits  COMPREHENSIVE METABOLIC PANEL WITH GFR - Abnormal; Notable for the following components:   Glucose, Bld 105 (*)    BUN 74 (*)    Creatinine, Ser 1.91 (*)  Calcium  8.3 (*)    Total Protein 6.4 (*)    GFR, Estimated 25 (*)    All other components within normal limits  BRAIN NATRIURETIC PEPTIDE - Abnormal; Notable for the following components:   B Natriuretic Peptide 1,417.0 (*)    All other components within normal limits     EKG  ED ECG REPORT I, Waylon Cassis, the attending physician, personally viewed and interpreted this ECG.  Date: 06/11/2024 EKG Time: 2221 Rate: 82 Rhythm: normal sinus rhythm QRS Axis: normal Intervals: LBBB ST/T Wave abnormalities: normal Narrative Interpretation: no evidence of acute ischemia   RADIOLOGY  Chest x-ray: I independently viewed and interpreted the images; there is cardiomegaly with no focal consolidation or edema   PROCEDURES:  Critical Care performed: No  Procedures   MEDICATIONS ORDERED IN ED: Medications  furosemide  (LASIX ) injection 40 mg (40 mg Intravenous Given 06/11/24 2346)     IMPRESSION / MDM / ASSESSMENT AND PLAN / ED COURSE  I reviewed the triage vital signs and the nursing notes.  89 year old female with PMH as noted above presents with  bilateral lower extremity edema which is worsened after a blood transfusion yesterday.  She has no respiratory symptoms currently.  Exam is overall reassuring.  Differential diagnosis includes, but is not limited to, peripheral edema related to the blood transfusion, CHF exacerbation.  Chest x-ray does not show any acute edema.  Will obtain lab workup, give IV Lasix , and reassess.  Patient's presentation is most consistent with acute complicated illness / injury requiring diagnostic workup.  The patient is on the cardiac monitor to evaluate for evidence of arrhythmia and/or significant heart rate changes.   ----------------------------------------- 12:59 AM on 06/12/2024 -----------------------------------------  The patient has responded to Lasix  and has good urine output.   Lab workup is significant for WBC count of 126 and hemoglobin of 8.9.  Hemoglobin is improved from prior and the WBC count is stable.  CMP shows creatinine at baseline.  BNP is elevated.  I did consider whether the patient may benefit from inpatient admission given her age and comorbidities.  However, since she is only having peripheral edema with no respiratory symptoms currently, O2 saturation is in the high 90s, and there is no evidence of edema on the x-ray, as well as the fact that she is responding appropriately to the Lasix , she is stable for discharge home.  The patient herself feels well and would like to go home.  I counseled her and her family member on the results of the workup.  I gave strict return precautions, and she expressed understanding.   FINAL CLINICAL IMPRESSION(S) / ED DIAGNOSES   Final diagnoses:  Peripheral edema     Rx / DC Orders   ED Discharge Orders     None        Note:  This document was prepared using Dragon voice recognition software and may include unintentional dictation errors.    Cassis Waylon, MD 06/12/24 [phone]

## 2024-06-11 NOTE — ED Triage Notes (Signed)
 Pt reports she was given 2 units of blood yesterday for her CLL. Pt states today she developed shortness of breath and bilateral leg swelling. Pt states this was the first time she has ever had 2 units of blood, normally she has just 1.

## 2024-06-11 NOTE — Telephone Encounter (Signed)
 Daughter called to report concerns of swelling of bilateral ankles since 2 units of blood transfusion today.  Per daughter, patient denies any shortness of breath.  Patient's daughter is concerned of patient going to sleep with leg swelling.  Patient has CHF, CKD and is on Lasix  40 mg daily. Recommend patient to go to emergency room for further evaluation and IV diuretics.  If patient does not want to go to ER, recommend her to take an extra dosage of Lasix  20 mg and see if that helps her's symptoms.  Daughter appreciated advised and will further discussed with patient.

## 2024-06-12 ENCOUNTER — Ambulatory Visit

## 2024-06-12 ENCOUNTER — Telehealth: Payer: Self-pay | Admitting: *Deleted

## 2024-06-12 LAB — BRAIN NATRIURETIC PEPTIDE: B Natriuretic Peptide: 1417 pg/mL — ABNORMAL HIGH (ref 0.0–100.0)

## 2024-06-12 NOTE — Discharge Instructions (Signed)
 Take 40 mg of Lasix  in the morning as discussed.  Follow-up with your regular doctor and oncologist.  Return to the ER for new, worsening, or persistent severe swelling, shortness of breath, chest pain, weakness, or any other new or worsening symptoms that concern you.

## 2024-06-12 NOTE — ED Notes (Signed)
 Patient ambulated to the bathroom at this time.

## 2024-06-12 NOTE — Telephone Encounter (Signed)
 Daughter called with update on patient.  Pt had received 2 units of blood on 06/10/24.  On 06/11/24 patient had increased edema in lower extremities.  Caregiver called on call MD and was instructed to either give a dose of lasix  at home or take patient to ED.  Patient taken to ED at [facility], patient given IV lasix  and was discharged home.  Patient returns on Monday 06/16/24 for labs and visit with Tinnie Dawn, NP and possible blood on 06/17/24.

## 2024-06-16 ENCOUNTER — Inpatient Hospital Stay (HOSPITAL_BASED_OUTPATIENT_CLINIC_OR_DEPARTMENT_OTHER): Admitting: Nurse Practitioner

## 2024-06-16 ENCOUNTER — Encounter: Payer: Self-pay | Admitting: Nurse Practitioner

## 2024-06-16 ENCOUNTER — Inpatient Hospital Stay

## 2024-06-16 ENCOUNTER — Telehealth: Payer: Self-pay | Admitting: *Deleted

## 2024-06-16 VITALS — BP 120/54 | HR 73 | Temp 97.0°F | Resp 16 | Wt 93.4 lb

## 2024-06-16 DIAGNOSIS — C9110 Chronic lymphocytic leukemia of B-cell type not having achieved remission: Secondary | ICD-10-CM

## 2024-06-16 DIAGNOSIS — D649 Anemia, unspecified: Secondary | ICD-10-CM

## 2024-06-16 LAB — CBC WITH DIFFERENTIAL (CANCER CENTER ONLY)
Abs Immature Granulocytes: 0.3 10*3/uL — ABNORMAL HIGH (ref 0.00–0.07)
Basophils Absolute: 0.2 10*3/uL — ABNORMAL HIGH (ref 0.0–0.1)
Basophils Relative: 0 %
Eosinophils Absolute: 2.1 10*3/uL — ABNORMAL HIGH (ref 0.0–0.5)
Eosinophils Relative: 2 %
HCT: 27.6 % — ABNORMAL LOW (ref 36.0–46.0)
Hemoglobin: 8.3 g/dL — ABNORMAL LOW (ref 12.0–15.0)
Immature Granulocytes: 0 %
Lymphocytes Relative: 91 %
Lymphs Abs: 105.8 10*3/uL — ABNORMAL HIGH (ref 0.7–4.0)
MCH: 29.9 pg (ref 26.0–34.0)
MCHC: 30.1 g/dL (ref 30.0–36.0)
MCV: 99.3 fL (ref 80.0–100.0)
Monocytes Absolute: 3 10*3/uL — ABNORMAL HIGH (ref 0.1–1.0)
Monocytes Relative: 3 %
Neutro Abs: 4.5 10*3/uL (ref 1.7–7.7)
Neutrophils Relative %: 4 %
Platelet Count: 156 10*3/uL (ref 150–400)
RBC: 2.78 MIL/uL — ABNORMAL LOW (ref 3.87–5.11)
RDW: 21.5 % — ABNORMAL HIGH (ref 11.5–15.5)
Smear Review: NORMAL
WBC Count: 115.8 10*3/uL (ref 4.0–10.5)
nRBC: 0 % (ref 0.0–0.2)

## 2024-06-16 LAB — BASIC METABOLIC PANEL WITH GFR
Anion gap: 8 (ref 5–15)
BUN: 69 mg/dL — ABNORMAL HIGH (ref 8–23)
CO2: 24 mmol/L (ref 22–32)
Calcium: 8.4 mg/dL — ABNORMAL LOW (ref 8.9–10.3)
Chloride: 107 mmol/L (ref 98–111)
Creatinine, Ser: 1.6 mg/dL — ABNORMAL HIGH (ref 0.44–1.00)
GFR, Estimated: 31 mL/min — ABNORMAL LOW (ref >60)
Glucose, Bld: 134 mg/dL — ABNORMAL HIGH (ref 70–99)
Potassium: 4.5 mmol/L (ref 3.5–5.1)
Sodium: 139 mmol/L (ref 135–145)

## 2024-06-16 LAB — LACTATE DEHYDROGENASE: LDH: 153 U/L (ref 98–192)

## 2024-06-16 LAB — SAMPLE TO BLOOD BANK

## 2024-06-16 NOTE — Progress Notes (Signed)
 Symptom Management Clinic  [facility] A Department of the [place]. [facility] [address] [place], [place] [address] [phone] (phone) [phone] (fax)  Patient Care Team: Valora Agent, MD as PCP - General (Family Medicine) Rennie Cindy SAUNDERS, MD as Consulting Physician (Internal Medicine)   Name of the patient: Lindsay [name]  [id number]  1934-10-26   Date of visit: 06/16/24  Diagnosis- CLL  Chief complaint/ Reason for visit- Weakness & lethargy  Heme/Onc history:  Oncology History Overview Note  # LYMPHOCYTOSIS [25,K]/ANEMIA 9-10]; platelets-N; IgVH Somatic Hypermutation was not detected.56% OF NUCLEI POSITIVE FOR A 13Q DELETION; The phenotype is most consistent with a diagnosis of 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 Low level hypermetabolism consistent with CLL.2. Splenomegaly but no focal splenic lesions.  # April 2022- 13 Lindsay [name]; OSKER- UNMUTATED  # MID April 2022- IBRUTINIB  280 mg x3 days [STOPPED sec ER/hypertensive emergency- 191/129]  # MAY 26th, 2022- start acalabrutinib  100 mg once a day [reduced dose]; intermittent; discontinued  September mid 2022. [Muscle cramps.]  # MAY 1st week- 2024- Zanubrutinib  80 mg; stopped after 1-2 weeks- sec to acute renal failure hyperkalemia/extreme fatigue etc.  Patient declined further therapies.       CLL (chronic lymphocytic leukemia) (HCC)  02/14/2021 Initial Diagnosis   CLL (chronic lymphocytic leukemia) (HCC)   04/07/2021 Cancer Staging   Staging form: Chronic Lymphocytic Leukemia / Small Lymphocytic Lymphoma, AJCC 8th Edition - Clinical: Modified Rai Stage III (Modified Rai risk: High, Binet: Stage C) - Signed by Rennie Cindy SAUNDERS, MD on 04/07/2021 Stage prefix: Initial diagnosis   12/16/2021 - 12/16/2021  Chemotherapy   Patient is on Treatment Plan : CLL - Rituximab q 4 weeks      Interval History- Lindsay [name] is a 89 y.o. female who returns to clinic for follow up of her CLL. She had to go to ER for lasix  for shortness of breath after her 2 units of blood last week. Her energy has improved. She feels well and has had more energy. Denies complaints today. She has previously had intolerances to CLL treatments and is receiving supportive care.   Review of systems- Review of Systems  Constitutional:  Positive for malaise/fatigue. Negative for chills, fever and weight loss.  HENT:  Negative for hearing loss, nosebleeds, sore throat and tinnitus.   Eyes:  Negative for blurred vision and double vision.  Respiratory:  Positive for shortness of breath. Negative for cough, hemoptysis and wheezing.   Cardiovascular:  Negative for chest pain, palpitations and leg swelling.  Gastrointestinal:  Negative for abdominal pain, blood in stool, constipation, diarrhea, melena, nausea and vomiting.  Genitourinary:  Negative for dysuria and urgency.  Musculoskeletal:  Negative for back pain, falls, joint pain and myalgias.  Skin:  Negative for itching and rash.  Neurological:  Positive for weakness. Negative for dizziness, tingling, sensory change, loss of consciousness and headaches.  Endo/Heme/Allergies:  Negative for environmental allergies. Does not bruise/bleed easily.  Psychiatric/Behavioral:  Negative for depression. The patient is not nervous/anxious and does not have insomnia.     Allergies  Allergen Reactions   Gabapentin Swelling    05/01/23: Pt stated she has facial swelling when taking Gabapentin.   Demerol [Meperidine]    Past Medical History:  Diagnosis Date   Arthritis    RHEUMATOID   CHF (congestive heart failure) (HCC)  CLL (chronic lymphocytic leukemia) (HCC)    Edema    MILD ANKLE OCCAS   Fibromyalgia    GERD (gastroesophageal reflux disease)    Hypertension    Kidney stones     RLS (restless legs syndrome)    Past Surgical History:  Procedure Laterality Date   ABDOMINAL HYSTERECTOMY     CATARACT EXTRACTION W/PHACO Left 02/07/2016   Procedure: CATARACT EXTRACTION PHACO AND INTRAOCULAR LENS PLACEMENT (IOC);  Surgeon: Steven Dingeldein, MD;  Location: [facility] ORS;  Service: Ophthalmology;  Laterality: Left;  US  01:33 AP% 24.7 CDE 39.57 fluid pack lot # [id number]   EXTRACORPOREAL SHOCK WAVE LITHOTRIPSY Right 07/03/2019   Procedure: EXTRACORPOREAL SHOCK WAVE LITHOTRIPSY (ESWL);  Surgeon: Kassie Ozell SAUNDERS, MD;  Location: [facility] ORS;  Service: Urology;  Laterality: Right;   EYE SURGERY     HIP ARTHROPLASTY Left 04/20/2020   Procedure: ARTHROPLASTY BIPOLAR HIP (HEMIARTHROPLASTY);  Surgeon: Kathlynn Ozell, MD;  Location: [facility] ORS;  Service: Orthopedics;  Laterality: Left;   LITHOTRIPSY     TONSILLECTOMY     Social History   Socioeconomic History   Marital status: Widowed    Spouse name: Not on file   Number of children: Not on file   Years of education: Not on file   Highest education level: Not on file  Occupational History   Not on file  Tobacco Use   Smoking status: Never   Smokeless tobacco: Never  Substance and Sexual Activity   Alcohol use: No   Drug use: Never   Sexual activity: Not Currently  Other Topics Concern   Not on file  Social History Narrative   Lives with daughter, in [place]. 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    Social Drivers of Corporate investment banker Strain: Low Risk  (12/24/2023)   Received from [facility]   Overall Financial Resource Strain (CARDIA)    Difficulty of Paying Living Expenses: Not hard at all  Food Insecurity: No Food Insecurity (12/24/2023)   Received from [facility]   Hunger Vital Sign    Within the past 12 months, the food you bought just didn't last and you didn't have money to get more.: Never true    Within the past 12 months, you worried that your food would  run out before you got the money to buy more.: Never true  Transportation Needs: No Transportation Needs (12/24/2023)   Received from [facility]   PRAPARE - Transportation    Lack of Transportation (Non-Medical): No    In the past 12 months, has lack of transportation kept you from medical appointments or from getting medications?: No  Physical Activity: Not on file  Stress: Not on file  Social Connections: Not on file  Intimate Partner Violence: Not At Risk (09/19/2023)   Humiliation, Afraid, Rape, and Kick questionnaire    Fear of Current or Ex-Partner: No    Emotionally Abused: No    Physically Abused: No    Sexually Abused: No   Family History  Problem Relation Age of Onset   Cancer Mother        uterine cancer   Current Outpatient Medications:    amitriptyline  (ELAVIL ) 10 MG tablet, Take 20 mg by mouth at bedtime., Disp: , Rfl:    cyanocobalamin  (,VITAMIN B-12,) 1000 MCG/ML injection, Inject into the muscle every 14 (fourteen) days. Next injection 4/16, Disp: , Rfl:    cyanocobalamin  (VITAMIN B12) 1000 MCG tablet, Take 1,000 mcg by  mouth daily., Disp: , Rfl:    DULoxetine  (CYMBALTA ) 20 MG capsule, Take 20 mg by mouth daily., Disp: , Rfl:    Ferrous Sulfate 28 MG TABS, Take 28 mg by mouth daily., Disp: , Rfl:    furosemide  (LASIX ) 20 MG tablet, TAKE TWO TABLETS (40 MG TOTAL) BY MOUTH DAILY., Disp: 60 tablet, Rfl: 5   MELATONIN GUMMIES PO, Take 3 mg by mouth at bedtime as needed., Disp: , Rfl:    ondansetron  (ZOFRAN ) 8 MG tablet, TAKE ONE TABLET (8 MG TOTAL) BY MOUTH EVERY EIGHT HOURS AS NEEDED FOR NAUSEA OR VOMITING., Disp: 20 tablet, Rfl: 0   oxyCODONE -acetaminophen  (PERCOCET/ROXICET) 5-325 MG tablet, Take 1-2 tablets by mouth every 4 (four) hours as needed for severe pain (pain score 7-10)., Disp: 90 tablet, Rfl: 0   polyethylene glycol powder (GLYCOLAX /MIRALAX ) 17 GM/SCOOP powder, Take 17 g by mouth 2 (two) times daily as needed for moderate constipation., Disp:  255 g, Rfl: 0  Physical exam:  Vitals:   06/16/24 1024  BP: (!) 120/54  Pulse: 73  Resp: 16  Temp: (!) 97 F (36.1 C)  TempSrc: Tympanic  SpO2: 95%  Weight: 93 lb 6.4 oz (42.4 kg)   Physical Exam Vitals reviewed.  Constitutional:      Appearance: She is not ill-appearing.  Pulmonary:     Effort: Pulmonary effort is normal.  Abdominal:     Tenderness: There is no abdominal tenderness.   Musculoskeletal:        General: No deformity.  Lymphadenopathy:     Cervical: Cervical adenopathy present.   Skin:    Coloration: Skin is not pale.   Neurological:     Mental Status: She is alert and oriented to person, place, and time.       Latest Ref Rng & Units 06/16/2024   10:09 AM  CMP  Glucose 70 - 99 mg/dL 865   BUN 8 - 23 mg/dL 69   Creatinine 9.55 - 1.00 mg/dL 8.39   Sodium 864 - 854 mmol/L 139   Potassium 3.5 - 5.1 mmol/L 4.5   Chloride 98 - 111 mmol/L 107   CO2 22 - 32 mmol/L 24   Calcium  8.9 - 10.3 mg/dL 8.4       Latest Ref Rng & Units 06/16/2024   10:09 AM  CBC  WBC 4.0 - 10.5 K/uL 115.8   Hemoglobin 12.0 - 15.0 g/dL 8.3   Hematocrit 63.9 - 46.0 % 27.6   Platelets 150 - 400 K/uL 156    DG Chest Port 1 View Result Date: 06/11/2024 CLINICAL DATA:  Shortness of breath EXAM: PORTABLE CHEST 1 VIEW COMPARISON:  Chest x-ray 09/19/2023 FINDINGS: The heart is mildly enlarged. There is no focal lung infiltrate, pleural effusion or pneumothorax. Nodular density overlying the lateral left lower lung may be related to a nipple shadow. No acute fractures are seen. IMPRESSION: 1. Mild cardiomegaly. No acute pulmonary process. 2. Nodular density overlying the lateral left lower lung may be related to a nipple shadow. Repeat chest x-ray with nipple markers is recommended. Electronically Signed   By: Greig Pique M.D.   On: 06/11/2024 22:46    Assessment and plan- Patient is a 89 y.o. female with CLL who returns to clinic for supportive care:   CLL (chronic lymphocytic  leukemia)  # CLL: [56% OF NUCLEI POSITIVE FOR A 13Q DELETION]- April 2022- PET scan shows significant lymphadenopathy above and below diaphragm; splenomegaly. Discontinued Ibrutinib  d/t hypertensive emergencies. Discontinued acalabrutinib  d/t leg cramps  even with minimum dose. Discontinued zanubrutinib - d/t hypokalemia loss and renal failure/extreme fatigue etc.  She has had progression of disease as evidenced by rising white count. She has previously declined treatment options but now questions revisiting. I'll schedule her with Dr Rennie next week to discuss if any options could be revisited and possible responses. Guarded. She expresses that quality of life is more important than quantity.     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 Progressively worsening along with disease. No evidence of hemolysis. Oct 2024- iron sat 11%. On gentle iron 28 mg daily. Retacrit  held d/t preference and lack of improvement.   # Today- Transfusion of 2 units of prbcs on 6/23 for hmg 5.9. Today, hmg 8.3. Hold transfusion. She has been needing transfusion roughly weekly.   # WBC 115.8- consistent with CLL  # Thrombocytopenia- plt 156. Ok for dental work, Catering manager.    # Cardiac -history of CHF / elevated blood pressure-overall stable continue monitoring at home. Plan for lasix  if she needs > 1 unit of pRBCs.    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    #  Goals of care- s/p palliative care. Receives supportive care.    Cancel blood tomorrow F/u as planned- la     Visit Diagnosis 1. Symptomatic anemia   2. CLL (chronic lymphocytic leukemia) (HCC)    Patient expressed understanding and was in agreement with this plan. She also understands that She can call clinic at any time with any questions, concerns, or complaints.   Thank you for allowing me to participate in the care of this very pleasant patient.   Tinnie Dawn, DNP, AGNP-C, AOCNP [facility] [phone]

## 2024-06-16 NOTE — Telephone Encounter (Signed)
 1052-Michelle RN spoke with Hillary in cancer center lab- regarding critical wbc count. Per Rosaline- read back process performed with lab tech.  1100-Lauren Dasie, NP-provided verbal report

## 2024-06-17 ENCOUNTER — Inpatient Hospital Stay

## 2024-06-17 ENCOUNTER — Telehealth: Payer: Self-pay | Admitting: *Deleted

## 2024-06-17 ENCOUNTER — Encounter: Payer: Self-pay | Admitting: Internal Medicine

## 2024-06-17 ENCOUNTER — Other Ambulatory Visit: Payer: Self-pay | Admitting: Hospice and Palliative Medicine

## 2024-06-17 NOTE — Telephone Encounter (Signed)
 Lindsay [name] (Key: [id number]) Rx #: [id number] Drug-oxyCODONE -Acetaminophen  5-325MG  tablets PA submitted to cover my meds.

## 2024-06-17 NOTE — Telephone Encounter (Signed)
 [facility] is reviewing your PA request. Typically an electronic response will be received within 24-72 hours.

## 2024-06-17 NOTE — Telephone Encounter (Signed)
 Lindsay [name] is this too soon to refill? Duplicate RF request???

## 2024-06-18 ENCOUNTER — Encounter: Payer: Self-pay | Admitting: Internal Medicine

## 2024-06-18 NOTE — Telephone Encounter (Signed)
 Attempted to reach [employer] [facility] Prior Authorization department at [phone] called regarding appealing the quantity of this. This can not be appealed on phone. Must complete form and faxed back

## 2024-06-18 NOTE — Telephone Encounter (Signed)
 Dr B signed form on behalf of Josh, NP. Rosaline to fax form to insurance.

## 2024-06-23 ENCOUNTER — Inpatient Hospital Stay: Attending: Nurse Practitioner

## 2024-06-23 ENCOUNTER — Ambulatory Visit

## 2024-06-23 ENCOUNTER — Inpatient Hospital Stay: Admitting: Internal Medicine

## 2024-06-23 ENCOUNTER — Encounter: Payer: Self-pay | Admitting: Internal Medicine

## 2024-06-23 ENCOUNTER — Other Ambulatory Visit: Payer: Self-pay | Admitting: *Deleted

## 2024-06-23 ENCOUNTER — Ambulatory Visit: Payer: Self-pay | Admitting: Internal Medicine

## 2024-06-23 VITALS — BP 159/62 | HR 75 | Temp 97.1°F | Resp 18 | Ht 63.0 in | Wt 93.1 lb

## 2024-06-23 DIAGNOSIS — E876 Hypokalemia: Secondary | ICD-10-CM | POA: Insufficient documentation

## 2024-06-23 DIAGNOSIS — Z79899 Other long term (current) drug therapy: Secondary | ICD-10-CM | POA: Insufficient documentation

## 2024-06-23 DIAGNOSIS — I509 Heart failure, unspecified: Secondary | ICD-10-CM | POA: Insufficient documentation

## 2024-06-23 DIAGNOSIS — C9110 Chronic lymphocytic leukemia of B-cell type not having achieved remission: Secondary | ICD-10-CM

## 2024-06-23 DIAGNOSIS — I130 Hypertensive heart and chronic kidney disease with heart failure and stage 1 through stage 4 chronic kidney disease, or unspecified chronic kidney disease: Secondary | ICD-10-CM | POA: Insufficient documentation

## 2024-06-23 DIAGNOSIS — D649 Anemia, unspecified: Secondary | ICD-10-CM | POA: Insufficient documentation

## 2024-06-23 DIAGNOSIS — N183 Chronic kidney disease, stage 3 unspecified: Secondary | ICD-10-CM | POA: Insufficient documentation

## 2024-06-23 DIAGNOSIS — R300 Dysuria: Secondary | ICD-10-CM | POA: Diagnosis not present

## 2024-06-23 LAB — URINALYSIS, COMPLETE (UACMP) WITH MICROSCOPIC
Bacteria, UA: NONE SEEN
Bilirubin Urine: NEGATIVE
Glucose, UA: NEGATIVE mg/dL
Hgb urine dipstick: NEGATIVE
Ketones, ur: NEGATIVE mg/dL
Leukocytes,Ua: NEGATIVE
Nitrite: NEGATIVE
Protein, ur: NEGATIVE mg/dL
RBC / HPF: 0 RBC/hpf (ref 0–5)
Specific Gravity, Urine: 1.011 (ref 1.005–1.030)
pH: 5 (ref 5.0–8.0)

## 2024-06-23 LAB — BASIC METABOLIC PANEL - CANCER CENTER ONLY
Anion gap: 8 (ref 5–15)
BUN: 75 mg/dL — ABNORMAL HIGH (ref 8–23)
CO2: 21 mmol/L — ABNORMAL LOW (ref 22–32)
Calcium: 8.6 mg/dL — ABNORMAL LOW (ref 8.9–10.3)
Chloride: 108 mmol/L (ref 98–111)
Creatinine: 1.79 mg/dL — ABNORMAL HIGH (ref 0.44–1.00)
GFR, Estimated: 27 mL/min — ABNORMAL LOW (ref >60)
Glucose, Bld: 182 mg/dL — ABNORMAL HIGH (ref 70–99)
Potassium: 4.3 mmol/L (ref 3.5–5.1)
Sodium: 137 mmol/L (ref 135–145)

## 2024-06-23 LAB — CBC WITH DIFFERENTIAL (CANCER CENTER ONLY)
Abs Immature Granulocytes: 0.3 K/uL — ABNORMAL HIGH (ref 0.00–0.07)
Basophils Absolute: 0.1 K/uL (ref 0.0–0.1)
Basophils Relative: 0 %
Eosinophils Absolute: 1.6 K/uL — ABNORMAL HIGH (ref 0.0–0.5)
Eosinophils Relative: 1 %
HCT: 25.9 % — ABNORMAL LOW (ref 36.0–46.0)
Hemoglobin: 7.7 g/dL — ABNORMAL LOW (ref 12.0–15.0)
Immature Granulocytes: 0 %
Lymphocytes Relative: 92 %
Lymphs Abs: 106.4 K/uL — ABNORMAL HIGH (ref 0.7–4.0)
MCH: 29.8 pg (ref 26.0–34.0)
MCHC: 29.7 g/dL — ABNORMAL LOW (ref 30.0–36.0)
MCV: 100.4 fL — ABNORMAL HIGH (ref 80.0–100.0)
Monocytes Absolute: 3 K/uL — ABNORMAL HIGH (ref 0.1–1.0)
Monocytes Relative: 3 %
Neutro Abs: 4.3 K/uL (ref 1.7–7.7)
Neutrophils Relative %: 4 %
Platelet Count: 165 K/uL (ref 150–400)
RBC: 2.58 MIL/uL — ABNORMAL LOW (ref 3.87–5.11)
RDW: 23.3 % — ABNORMAL HIGH (ref 11.5–15.5)
Smear Review: NORMAL
WBC Count: 115.7 K/uL (ref 4.0–10.5)
nRBC: 0 % (ref 0.0–0.2)

## 2024-06-23 LAB — LACTATE DEHYDROGENASE: LDH: 152 U/L (ref 98–192)

## 2024-06-23 NOTE — Progress Notes (Signed)
 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 Low level hypermetabolism consistent with CLL.2. Splenomegaly but no focal splenic lesions.  # April 2022- 13 Q DEL; IGVH- UNMUTATED  # MID April 2022- IBRUTINIB  280 mg x3 days [STOPPED sec ER/hypertensive emergency- 191/129]  # MAY 26th, 2022- start acalabrutinib  100 mg once a day [reduced dose]; intermittent; discontinued  September mid 2022. [Muscle cramps.]  # MAY 1st week- 2024- Zanubrutinib  80 mg; stopped after 1-2 weeks- sec to acute renal failure hyperkalemia/extreme fatigue etc.  Patient declined further therapies.       CLL (chronic lymphocytic leukemia) (HCC)  02/14/2021 Initial Diagnosis   CLL (chronic lymphocytic leukemia) (HCC)   04/07/2021 Cancer Staging   Staging form: Chronic Lymphocytic Leukemia / Small Lymphocytic Lymphoma, AJCC 8th Edition - Clinical: Modified Rai Stage III (Modified Rai risk: High, Binet: Stage C) - Signed by Rennie Cindy SAUNDERS, MD on 04/07/2021 Stage prefix: Initial diagnosis   12/16/2021 - 12/16/2021 Chemotherapy   Patient is on Treatment Plan : CLL - Rituximab q 4 weeks      HISTORY OF PRESENTING ILLNESS: Frail-appearing Caucasian female patient/accompanied by daughter.  Ambulating independently.  Lindsay [name] 89 y.o.  female symptomatic CLL currently on on surveillance because of intolerance to  ibrutinib /acalabrutinib /zanubrutinib  is here for follow-up-currently on best supportive care with blood transfusions.  Patient is feeling pretty good. Having some UTI symptoms-with increased frequency of urination.  No fever no chills.  Complains of poor appetite.  Positive for weight loss.   No appetite, no supplement drink.   Also noticed significant increasing size of the neck lymph nodes/underarm lymph nodes.  Denies any worsening shortness she admits to be compliant with her Lasix .  Review of Systems  Constitutional:  Positive for malaise/fatigue and weight loss. Negative for chills, diaphoresis and fever.  HENT:  Negative for nosebleeds and sore throat.   Eyes:  Negative for double vision.  Respiratory:  Negative for cough, hemoptysis, sputum production, shortness of breath and wheezing.   Cardiovascular:  Negative for chest pain, palpitations, orthopnea and leg swelling.  Gastrointestinal:  Negative for abdominal pain, constipation, diarrhea, heartburn, melena, nausea and vomiting.  Genitourinary:  Negative for dysuria, frequency and urgency.  Musculoskeletal:  Positive for joint pain. Negative for back pain.  Skin: Negative.  Negative for itching and rash.  Neurological:  Negative for dizziness, tingling, focal weakness, weakness and headaches.  Endo/Heme/Allergies:  Bruises/bleeds easily.  Psychiatric/Behavioral:  Negative for depression. The patient is not nervous/anxious and does not have insomnia.      MEDICAL HISTORY:  Past Medical History:  Diagnosis Date   Arthritis    RHEUMATOID   CHF (congestive heart failure) (HCC)    CLL (chronic lymphocytic leukemia) (HCC)    Edema    MILD ANKLE OCCAS   Fibromyalgia    GERD (gastroesophageal reflux disease)    Hypertension    Kidney stones    RLS (restless legs syndrome)     SURGICAL HISTORY: Past Surgical History:  Procedure Laterality Date  ABDOMINAL HYSTERECTOMY     CATARACT EXTRACTION W/PHACO Left 02/07/2016    Procedure: CATARACT EXTRACTION PHACO AND INTRAOCULAR LENS PLACEMENT (IOC);  Surgeon: Steven Dingeldein, MD;  Location: [facility] ORS;  Service: Ophthalmology;  Laterality: Left;  US  01:33 AP% 24.7 CDE 39.57 fluid pack lot # [id number]   EXTRACORPOREAL SHOCK WAVE LITHOTRIPSY Right 07/03/2019   Procedure: EXTRACORPOREAL SHOCK WAVE LITHOTRIPSY (ESWL);  Surgeon: Kassie Ozell SAUNDERS, MD;  Location: [facility] ORS;  Service: Urology;  Laterality: Right;   EYE SURGERY     HIP ARTHROPLASTY Left 04/20/2020   Procedure: ARTHROPLASTY BIPOLAR HIP (HEMIARTHROPLASTY);  Surgeon: Kathlynn Ozell, MD;  Location: [facility] ORS;  Service: Orthopedics;  Laterality: Left;   LITHOTRIPSY     TONSILLECTOMY      SOCIAL HISTORY: Social History   Socioeconomic History   Marital status: Widowed    Spouse name: Not on file   Number of children: Not on file   Years of education: Not on file   Highest education level: Not on file  Occupational History   Not on file  Tobacco Use   Smoking status: Never   Smokeless tobacco: Never  Substance and Sexual Activity   Alcohol use: No   Drug use: Never   Sexual activity: Not Currently  Other Topics Concern   Not on file  Social History Narrative   Lives with daughter, in [place]. 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    Social Drivers of Corporate investment banker Strain: Low Risk  (12/24/2023)   Received from [facility]   Overall Financial Resource Strain (CARDIA)    Difficulty of Paying Living Expenses: Not hard at all  Food Insecurity: No Food Insecurity (12/24/2023)   Received from [facility]   Hunger Vital Sign    Within the past 12 months, the food you bought just didn't last and you didn't have money to get more.: Never true    Within the past 12 months, you worried that your food would run out before you got the money to buy more.: Never true  Transportation Needs: No Transportation Needs (12/24/2023)   Received from [facility]   PRAPARE - Transportation    Lack of Transportation (Non-Medical): No    In the past 12 months, has lack of transportation kept you from medical appointments or from getting medications?: No  Physical Activity: Not on file  Stress: Not on file  Social Connections: Not on file  Intimate Partner Violence: Not At Risk (09/19/2023)   Humiliation, Afraid, Rape, and Kick questionnaire    Fear of Current or Ex-Partner: No    Emotionally Abused: No    Physically Abused: No    Sexually Abused: No    FAMILY HISTORY: Family History  Problem Relation Age of Onset   Cancer Mother        uterine cancer    ALLERGIES:  is allergic to gabapentin and demerol [meperidine].  MEDICATIONS:  Current Outpatient Medications  Medication Sig Dispense Refill   amitriptyline  (ELAVIL ) 10 MG tablet Take 20 mg by mouth at bedtime.     cyanocobalamin  (,VITAMIN B-12,) 1000 MCG/ML injection Inject into the muscle every 14 (fourteen) days. Next injection 4/16     cyanocobalamin  (VITAMIN B12) 1000 MCG tablet Take 1,000 mcg by mouth daily.     DULoxetine  (CYMBALTA ) 20 MG capsule Take 20 mg by mouth daily.     Ferrous Sulfate 28 MG TABS Take 28 mg by mouth daily.  furosemide  (LASIX ) 20 MG tablet TAKE TWO TABLETS (40 MG TOTAL) BY MOUTH DAILY. 60 tablet 5   MELATONIN GUMMIES PO Take 3 mg by mouth at bedtime as needed.     ondansetron  (ZOFRAN ) 8 MG tablet TAKE ONE TABLET (8 MG TOTAL) BY MOUTH EVERY EIGHT HOURS AS NEEDED FOR NAUSEA OR VOMITING. 20 tablet 0   oxyCODONE -acetaminophen  (PERCOCET/ROXICET) 5-325 MG tablet Take 1-2 tablets by mouth every 4 (four) hours as needed for severe pain (pain score 7-10). 90 tablet 0   polyethylene glycol powder (GLYCOLAX /MIRALAX ) 17 GM/SCOOP powder Take 17 g by mouth 2 (two) times daily as needed for moderate constipation. 255 g 0   No current facility-administered medications for this visit.    PHYSICAL EXAMINATION: ECOG PERFORMANCE STATUS: 0 -  Asymptomatic  Vitals:   06/23/24 1247 06/23/24 1322  BP: (!) 162/68 (!) 159/62  Pulse: 75   Resp: 18   Temp: (!) 97.1 F (36.2 C)   SpO2: 98%      Filed Weights   06/23/24 1247  Weight: 93 lb 1.6 oz (42.2 kg)   Bulky bilateral neck adenopathy; underarm adenopathy.  Physical Exam HENT:     Head: Normocephalic and atraumatic.     Mouth/Throat:     Pharynx: No oropharyngeal exudate.  Eyes:     Pupils: Pupils are equal, round, and reactive to light.  Cardiovascular:     Rate and Rhythm: Normal rate and regular rhythm.  Pulmonary:     Effort: Pulmonary effort is normal. No respiratory distress.     Breath sounds: Normal breath sounds. No wheezing.  Abdominal:     General: Bowel sounds are normal. There is no distension.     Palpations: Abdomen is soft. There is no mass.     Tenderness: There is no abdominal tenderness. There is no guarding or rebound.  Musculoskeletal:        General: No tenderness. Normal range of motion.     Cervical back: Normal range of motion and neck supple.  Skin:    General: Skin is warm.     Comments: Chronic multiple bruises noted.  Neurological:     Mental Status: She is alert and oriented to person, place, and time.  Psychiatric:        Mood and Affect: Affect normal.      LABORATORY DATA:  I have reviewed the data as listed Lab Results  Component Value Date   WBC 115.7 (HH) 06/23/2024   HGB 7.7 (L) 06/23/2024   HCT 25.9 (L) 06/23/2024   MCV 100.4 (H) 06/23/2024   PLT 165 06/23/2024   Recent Labs    04/13/24 1123 04/28/24 1446 06/09/24 1006 06/11/24 2234 06/16/24 1009 06/23/24 1247  NA 137   < > 136 139 139 137  K 4.0   < > 4.5 4.5 4.5 4.3  CL 111   < > 105 106 107 108  CO2 20*   < > 20* 22 24 21*  GLUCOSE 172*   < > 165* 105* 134* 182*  BUN 51*   < > 72* 74* 69* 75*  CREATININE 1.54*   < > 2.00* 1.91* 1.60* 1.79*  CALCIUM  8.4*   < > 8.2* 8.3* 8.4* 8.6*  GFRNONAA 32*   < > 24* 25* 31* 27*  PROT 6.4*  --  5.9* 6.4*  --    --   ALBUMIN 4.0  --  3.9 4.1  --   --   AST 19  --  16 19  --   --  ALT 8  --  9 10  --   --   ALKPHOS 107  --  108 109  --   --   BILITOT 1.0  --  0.9 0.9  --   --    < > = values in this interval not displayed.    RADIOGRAPHIC STUDIES: I have personally reviewed the radiological images as listed and agreed with the findings in the report. DG Chest Port 1 View Result Date: 06/11/2024 CLINICAL DATA:  Shortness of breath EXAM: PORTABLE CHEST 1 VIEW COMPARISON:  Chest x-ray 09/19/2023 FINDINGS: The heart is mildly enlarged. There is no focal lung infiltrate, pleural effusion or pneumothorax. Nodular density overlying the lateral left lower lung may be related to a nipple shadow. No acute fractures are seen. IMPRESSION: 1. Mild cardiomegaly. No acute pulmonary process. 2. Nodular density overlying the lateral left lower lung may be related to a nipple shadow. Repeat chest x-ray with nipple markers is recommended. Electronically Signed   By: Greig Pique M.D.   On: 06/11/2024 22:46     ASSESSMENT & PLAN:   CLL (chronic lymphocytic leukemia) (HCC) # CLL: [56% OF NUCLEI POSITIVE FOR A 13Q DELETION]; April 2022-PET scan shows significant lymphadenopathy above and below diaphragm; splenomegaly. Discontinued Ibrutinib  [hypertensive emergencies] acalabrutinib  [leg cramps even with minimum dose].Discontinued zanubrutinib . hypokalemia loss and renal failure/extreme fatigue etc.    # Progression of disease given the rising white count. Again declined any treatment options.   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  #OCT 2024- I sat- 11%- on  gentle iron [iron biglycinate; 28 mg ] 1 pill a day. HOLD retacrit  today-given patient's preference/lack of improvement. WBC 115; HB -7.7 PLATELETS- 130 [ok with dental work/extractions]-continue supportive transfusions.  # Cardiac -history of CHF / elevated blood pressure- will  add lasix  with PRBC infusion- continue monitoring  at home- stable  # UTI-like symptoms increase frequency of urination-check UA and culture.  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   #  s/p pallaitive care-as per patient wishes continue follow-up hospice.  Continue supportive care.  Discussed with Dr. Sherleen.  # ACP- DNR   # DISPOSITION: # 1 unit of blood tomorrow; add lasix  20 mg IV post transfusion # in 2 weeks- labs- cbc/HOLD tube; D-2 possible 1 unit PRBC blood; lasix  20 mg IV post transfusion # follow up in 4 weeks- MD/APP; labs- cbc/ bmp; LDH; HOLD tube; D-2- possible 1 unit PRBC-lasix  20 mg IV post transfusion Dr.B   All questions were answered. The patient knows to call the clinic with any problems, questions or concerns.    Cindy JONELLE Joe, MD 06/23/2024 2:12 PM

## 2024-06-23 NOTE — Assessment & Plan Note (Addendum)
#   CLL: [56% OF NUCLEI POSITIVE FOR A 13Q DELETION]; April 2022-PET scan shows significant lymphadenopathy above and below diaphragm; splenomegaly. Discontinued Ibrutinib  [hypertensive emergencies] acalabrutinib  [leg cramps even with minimum dose].Discontinued zanubrutinib . hypokalemia loss and renal failure/extreme fatigue etc.    # Progression of disease given the rising white count. Again declined any treatment options.   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  #OCT 2024- I sat- 11%- on  gentle iron [iron biglycinate; 28 mg ] 1 pill a day. HOLD retacrit  today-given patient's preference/lack of improvement. WBC 115; HB -7.7 PLATELETS- 130 [ok with dental work/extractions]-continue supportive transfusions.  # Cardiac -history of CHF / elevated blood pressure- will  add lasix  with PRBC infusion- continue monitoring at home- stable  # UTI-like symptoms increase frequency of urination-check UA and culture.  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   #  s/p pallaitive care-as per patient wishes continue follow-up hospice.  Continue supportive care.  Discussed with Dr. Sherleen.  # ACP- DNR   # DISPOSITION: # 1 unit of blood tomorrow; add lasix  20 mg IV post transfusion # in 2 weeks- labs- cbc/HOLD tube; D-2 possible 1 unit PRBC blood; lasix  20 mg IV post transfusion # follow up in 4 weeks- MD/APP; labs- cbc/ bmp; LDH; HOLD tube; D-2- possible 1 unit PRBC-lasix  20 mg IV post transfusion Dr.B

## 2024-06-23 NOTE — Progress Notes (Signed)
 Call from [facility] in lab with Critical results; WBC 115.7. Readback. MD, Dr B notified.

## 2024-06-23 NOTE — Progress Notes (Signed)
 Patient is feeling pretty good. Having some UTI symptoms. I am giving her a cup.

## 2024-06-24 ENCOUNTER — Other Ambulatory Visit: Payer: Self-pay | Admitting: Internal Medicine

## 2024-06-24 ENCOUNTER — Other Ambulatory Visit: Payer: Self-pay | Admitting: Hospice and Palliative Medicine

## 2024-06-24 ENCOUNTER — Inpatient Hospital Stay

## 2024-06-24 DIAGNOSIS — C9110 Chronic lymphocytic leukemia of B-cell type not having achieved remission: Secondary | ICD-10-CM

## 2024-06-24 LAB — PREPARE RBC (CROSSMATCH)

## 2024-06-24 LAB — URINE CULTURE: Culture: NO GROWTH

## 2024-06-24 MED ORDER — DIPHENHYDRAMINE HCL 25 MG PO CAPS
25.0000 mg | ORAL_CAPSULE | Freq: Once | ORAL | Status: AC
  Administered 2024-06-24: 25 mg via ORAL
  Filled 2024-06-24: qty 1

## 2024-06-24 MED ORDER — FUROSEMIDE 10 MG/ML IJ SOLN
20.0000 mg | Freq: Once | INTRAMUSCULAR | Status: AC
  Administered 2024-06-24: 20 mg via INTRAVENOUS
  Filled 2024-06-24: qty 2

## 2024-06-24 MED ORDER — SODIUM CHLORIDE 0.9% IV SOLUTION
250.0000 mL | INTRAVENOUS | Status: DC
  Administered 2024-06-24: 100 mL via INTRAVENOUS
  Filled 2024-06-24: qty 250

## 2024-06-24 MED ORDER — ACETAMINOPHEN 325 MG PO TABS
650.0000 mg | ORAL_TABLET | Freq: Once | ORAL | Status: AC
  Administered 2024-06-24: 650 mg via ORAL
  Filled 2024-06-24: qty 2

## 2024-06-24 NOTE — Patient Instructions (Signed)

## 2024-06-24 NOTE — Telephone Encounter (Signed)
 Insurance will not cover percocet quantity due to exceeding max dose of tylenol . I spoke with Dr. KATHEE- ok to pend script for oxycodone  5 mg dosing 1-2 tablets (5-10 mg) every 4 hours for severe pain.  Daughter made aware of the plan.

## 2024-06-25 ENCOUNTER — Encounter: Payer: Self-pay | Admitting: Internal Medicine

## 2024-06-25 LAB — TYPE AND SCREEN
ABO/RH(D): O POS
Antibody Screen: POSITIVE
DAT, IgG: POSITIVE
DAT, complement: NEGATIVE
Donor AG Type: NEGATIVE
Unit division: 0

## 2024-06-25 LAB — BPAM RBC
Blood Product Expiration Date: 202507232359
ISSUE DATE / TIME: 202507080943
Unit Type and Rh: 5100

## 2024-06-26 ENCOUNTER — Encounter: Admitting: Family

## 2024-06-27 ENCOUNTER — Telehealth: Payer: Self-pay | Admitting: Family

## 2024-06-27 NOTE — Telephone Encounter (Signed)
 Called to confirm/remind patient of their appointment at the [facility] on 06/30/24.   Appointment:   [x] Confirmed  [] Left mess   [] No answer/No voice mail  [] VM Full/unable to leave message  [] Phone not in service  Patient reminded to bring all medications and/or complete list.  Confirmed patient has transportation. Gave directions, instructed to utilize valet parking.

## 2024-06-27 NOTE — Progress Notes (Unsigned)
 [facility] Note    PCP: Valora Agent, MD (last seen 03/25) Primary Cardiologist: None  Chief Complaint:   HPI:  Lindsay [name] is a 89 y/o female with a history of HTN, CKD, CLL (not taking chemo), anemia, LBBB, IBS, fibromyalgia, GERD, RLS and heart failure. She was unable to tolerate treatment of CLL with zanubrutinib  due to hyperkalemia, renal failure and extreme fatigue and has elected to stop any other treatment for this.  Echo 05/30/22: EF of 45-50% along with mild/moderate MR  Admitted 06/18/23 due to shortness of breath due to HF exacerbation. Chest x-ray showed cardiomegaly and interstitial edema. BNP 1254, troponin 80--> 116. IV diuresed. Echo 06/19/23: EF 40-45% along with mild LVH, Grade II DD, mildly elevated PA pressure, mild/ moderate MR with moderate AS with Aortic valve area, by VTI measures 1.00 cm. Aortic valve mean gradient measures 13.0 mmHg.   Admitted 09/19/23 due to shortness of breath. Had SIRS without infection likely d/t anemia with history of CLL. Also found to be anemic with HG 5.7. Received 2 units of PRBC's with IV lasix  afterwards. Symptoms improved. Entresto  was stopped due to hypotension.   She presents today for a HF follow-up visit with a chief complaint of very little fatigue with exertion. Has no other complaints and specifically denies shortness of breath, chest pain, palpitations, abdominal distention, pedal edema, dizziness, weight gain or difficulty sleeping.   Continues to decline treatment of her CLL. Has had side effects from previous medications and doesn't desire to go through that again.   ROS: All systems negative except what is listed in HPI, PMH and Problem List    Past Medical History:  Diagnosis Date   Arthritis    RHEUMATOID   CHF (congestive heart failure) (HCC)    CLL (chronic lymphocytic leukemia) (HCC)    Edema    MILD ANKLE OCCAS   Fibromyalgia    GERD (gastroesophageal reflux disease)    Hypertension     Kidney stones    RLS (restless legs syndrome)     Current Outpatient Medications  Medication Sig Dispense Refill   amitriptyline  (ELAVIL ) 10 MG tablet Take 20 mg by mouth at bedtime.     cyanocobalamin  (,VITAMIN B-12,) 1000 MCG/ML injection Inject into the muscle every 14 (fourteen) days. Next injection 4/16     cyanocobalamin  (VITAMIN B12) 1000 MCG tablet Take 1,000 mcg by mouth daily.     DULoxetine  (CYMBALTA ) 20 MG capsule Take 20 mg by mouth daily.     Ferrous Sulfate 28 MG TABS Take 28 mg by mouth daily.     furosemide  (LASIX ) 20 MG tablet TAKE TWO TABLETS (40 MG TOTAL) BY MOUTH DAILY. 60 tablet 5   MELATONIN GUMMIES PO Take 3 mg by mouth at bedtime as needed.     ondansetron  (ZOFRAN ) 8 MG tablet TAKE ONE TABLET (8 MG TOTAL) BY MOUTH EVERY EIGHT HOURS AS NEEDED FOR NAUSEA OR VOMITING. 20 tablet 0   oxyCODONE  (OXY IR/ROXICODONE ) 5 MG immediate release tablet Take 1-2 tablets (5-10 mg total) by mouth every 4 (four) hours as needed for severe pain (pain score 7-10) (cancer associated pain G89.3). 90 tablet 0   polyethylene glycol powder (GLYCOLAX /MIRALAX ) 17 GM/SCOOP powder Take 17 g by mouth 2 (two) times daily as needed for moderate constipation. 255 g 0   No current facility-administered medications for this visit.    Allergies  Allergen Reactions   Gabapentin Swelling    05/01/23: Pt stated she has facial swelling when taking  Gabapentin.   Demerol [Meperidine]       Social History   Socioeconomic History   Marital status: Widowed    Spouse name: Not on file   Number of children: Not on file   Years of education: Not on file   Highest education level: Not on file  Occupational History   Not on file  Tobacco Use   Smoking status: Never   Smokeless tobacco: Never  Substance and Sexual Activity   Alcohol use: No   Drug use: Never   Sexual activity: Not Currently  Other Topics Concern   Not on file  Social History Narrative   Lives with daughter, in [place]. Remote  smoking; no alcohol. 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    Social Drivers of Corporate investment banker Strain: Low Risk  (12/24/2023)   Received from [facility]   Overall Financial Resource Strain (CARDIA)    Difficulty of Paying Living Expenses: Not hard at all  Food Insecurity: No Food Insecurity (12/24/2023)   Received from [facility]   Hunger Vital Sign    Within the past 12 months, the food you bought just didn't last and you didn't have money to get more.: Never true    Within the past 12 months, you worried that your food would run out before you got the money to buy more.: Never true  Transportation Needs: No Transportation Needs (12/24/2023)   Received from [facility]   PRAPARE - Transportation    Lack of Transportation (Non-Medical): No    In the past 12 months, has lack of transportation kept you from medical appointments or from getting medications?: No  Physical Activity: Not on file  Stress: Not on file  Social Connections: Not on file  Intimate Partner Violence: Not At Risk (09/19/2023)   Humiliation, Afraid, Rape, and Kick questionnaire    Fear of Current or Ex-Partner: No    Emotionally Abused: No    Physically Abused: No    Sexually Abused: No      Family History  Problem Relation Age of Onset   Cancer Mother        uterine cancer   There were no vitals filed for this visit.  Wt Readings from Last 3 Encounters:  06/23/24 93 lb 1.6 oz (42.2 kg)  06/16/24 93 lb 6.4 oz (42.4 kg)  06/11/24 95 lb (43.1 kg)   Lab Results  Component Value Date   CREATININE 1.79 (H) 06/23/2024   CREATININE 1.60 (H) 06/16/2024   CREATININE 1.91 (H) 06/11/2024    PHYSICAL EXAM:  General: Well appearing. No resp difficulty HEENT: normal Neck: supple, no JVD Cor: Regular rhythm, rate. No rubs, gallops or murmurs Lungs: clear Abdomen: soft, nontender, nondistended. Extremities: no cyanosis, clubbing, rash, edema; knuckles on  fingers enlarged Neuro: alert & oriented X 3. Moves all 4 extremities w/o difficulty. Affect pleasant   ECG: not done   ASSESSMENT & PLAN:  1: NICM with mildly reduced ejection fraction- - likely due to HTN/ CLL - NYHA [name] II - euvolemic - weighing daily; reminded to call for an overnight weight gain of > 2 pounds or a weekly weight gain of > 5 pounds - weight stable from last visit here 3 months ago - Echo 05/30/22: EF of 45-50% along with mild/moderate MR - Echo 06/19/23: EF 40-45% along with mild LVH, Grade II DD, mildly elevated PA pressure, mild/ moderate MR with moderate AS with Aortic valve area, by VTI measures  1.00 cm. Aortic valve mean gradient measures 13.0 mmHg.  - continue furosemide  40mg  daily - has had hypotension in the past so may not be able to tolerate ARB/ [facility] - has had elevated K+ in the past so unable to use spironolactone - history of recurrent UTI so will not be a candidate for SGLT2 - will make cardiology referral so she can be established; she prefers [facility] so referral made to them - not candidate for advanced therapies due to progressing CLL - BNP 09/19/23 was 1417.2  2: HTN- - BP 124/30 - saw PCP Loree) 03/25 - BMP 03/12/24 reviewed: sodium 137, potassium 4.4, creatinine 1.89 and GFR 25  3: CLL- - saw oncology Oza) 03/26 - WBC 03/12/24 was 162.5 - disease progressing with rising WBC count; continues to decline treatment  4: 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   Lindsay DELENA Class, FNP 06/27/24

## 2024-06-30 ENCOUNTER — Ambulatory Visit: Attending: Family | Admitting: Family

## 2024-06-30 ENCOUNTER — Encounter: Payer: Self-pay | Admitting: Family

## 2024-06-30 VITALS — BP 146/51 | HR 70 | Wt 94.0 lb

## 2024-06-30 DIAGNOSIS — I5022 Chronic systolic (congestive) heart failure: Secondary | ICD-10-CM

## 2024-06-30 DIAGNOSIS — G2581 Restless legs syndrome: Secondary | ICD-10-CM | POA: Insufficient documentation

## 2024-06-30 DIAGNOSIS — I428 Other cardiomyopathies: Secondary | ICD-10-CM | POA: Insufficient documentation

## 2024-06-30 DIAGNOSIS — Z87891 Personal history of nicotine dependence: Secondary | ICD-10-CM | POA: Diagnosis not present

## 2024-06-30 DIAGNOSIS — I10 Essential (primary) hypertension: Secondary | ICD-10-CM

## 2024-06-30 DIAGNOSIS — E875 Hyperkalemia: Secondary | ICD-10-CM | POA: Diagnosis not present

## 2024-06-30 DIAGNOSIS — Z79899 Other long term (current) drug therapy: Secondary | ICD-10-CM | POA: Insufficient documentation

## 2024-06-30 DIAGNOSIS — M797 Fibromyalgia: Secondary | ICD-10-CM | POA: Insufficient documentation

## 2024-06-30 DIAGNOSIS — N189 Chronic kidney disease, unspecified: Secondary | ICD-10-CM | POA: Diagnosis not present

## 2024-06-30 DIAGNOSIS — R1012 Left upper quadrant pain: Secondary | ICD-10-CM | POA: Diagnosis not present

## 2024-06-30 DIAGNOSIS — I130 Hypertensive heart and chronic kidney disease with heart failure and stage 1 through stage 4 chronic kidney disease, or unspecified chronic kidney disease: Secondary | ICD-10-CM | POA: Insufficient documentation

## 2024-06-30 DIAGNOSIS — K589 Irritable bowel syndrome without diarrhea: Secondary | ICD-10-CM | POA: Insufficient documentation

## 2024-06-30 DIAGNOSIS — C9110 Chronic lymphocytic leukemia of B-cell type not having achieved remission: Secondary | ICD-10-CM

## 2024-06-30 DIAGNOSIS — R002 Palpitations: Secondary | ICD-10-CM | POA: Diagnosis not present

## 2024-06-30 DIAGNOSIS — D649 Anemia, unspecified: Secondary | ICD-10-CM | POA: Insufficient documentation

## 2024-06-30 DIAGNOSIS — Z87440 Personal history of urinary (tract) infections: Secondary | ICD-10-CM | POA: Diagnosis not present

## 2024-06-30 DIAGNOSIS — D519 Vitamin B12 deficiency anemia, unspecified: Secondary | ICD-10-CM

## 2024-06-30 NOTE — Patient Instructions (Signed)
 It was good to see you today!

## 2024-07-01 ENCOUNTER — Telehealth: Payer: Self-pay | Admitting: *Deleted

## 2024-07-01 NOTE — Telephone Encounter (Signed)
 The daughter called today she wanted to make sure that there was a lab encounter and a possibility of blood transfusion this week.  And she is going to have a telephone visit but it is for next week and the daughter is going out of town and so she wondered if we could change the telephone visit to sometime this week.  I called and spoke to [facility] and so we changed it to July 17 at 11:30 he is okay with that

## 2024-07-03 ENCOUNTER — Inpatient Hospital Stay (HOSPITAL_BASED_OUTPATIENT_CLINIC_OR_DEPARTMENT_OTHER): Admitting: Hospice and Palliative Medicine

## 2024-07-03 DIAGNOSIS — C9110 Chronic lymphocytic leukemia of B-cell type not having achieved remission: Secondary | ICD-10-CM

## 2024-07-03 DIAGNOSIS — Z515 Encounter for palliative care: Secondary | ICD-10-CM

## 2024-07-03 DIAGNOSIS — G893 Neoplasm related pain (acute) (chronic): Secondary | ICD-10-CM

## 2024-07-03 NOTE — Progress Notes (Signed)
 Virtual Visit via Telephone Note  I connected with Lindsay [name] on 07/03/24 at 11:30 AM EDT by telephone and verified that I am speaking with the correct person using two identifiers.  Location: Patient: Home Provider: Clinic   I discussed the limitations, risks, security and privacy concerns of performing an evaluation and management service by telephone and the availability of in person appointments. I also discussed with the patient that there may be a patient responsible charge related to this service. The patient expressed understanding and agreed to proceed.   History of Present Illness: Lindsay [name] is a 89 y.o. female with multiple medical problems including CLL currently on surveillance because of intolerance to previous treatments, transfusion dependence, history of CHF and CKD.  Patient has had rising white counts.  Was referred to palliative care to address goals and manage ongoing symptoms.   Observations/Objective: I called and spoke with patient and daughter.  Both feel that patient is doing reasonably well.  No acute changes or symptoms were reported.  Patient continues to desire transfusions and feels better after receiving them.  Pain is reportedly stable on as needed oxycodone .  Daughter says she is managing the pain medications and trying to give patient 1 tablet instead of 2.  We had previously discussed the option of starting a long-acting opioid if needed.  Assessment and Plan: CLL -on chronic transfusions  Neoplasm related pain -continue as needed oxycodone   Follow Up Instructions: RTC 3 weeks   I discussed the assessment and treatment plan with the patient. The patient was provided an opportunity to ask questions and all were answered. The patient agreed with the plan and demonstrated an understanding of the instructions.   The patient was advised to call back or seek an in-person evaluation if the symptoms worsen or if the condition fails to improve  as anticipated.  I provided 10 minutes of non-face-to-face time during this encounter.   FONDA JONELLE MOWER, NP

## 2024-07-07 ENCOUNTER — Inpatient Hospital Stay

## 2024-07-07 ENCOUNTER — Other Ambulatory Visit: Payer: Self-pay | Admitting: *Deleted

## 2024-07-07 DIAGNOSIS — C9110 Chronic lymphocytic leukemia of B-cell type not having achieved remission: Secondary | ICD-10-CM | POA: Diagnosis not present

## 2024-07-07 DIAGNOSIS — D649 Anemia, unspecified: Secondary | ICD-10-CM

## 2024-07-07 LAB — CBC WITH DIFFERENTIAL (CANCER CENTER ONLY)
Abs Immature Granulocytes: 0.22 K/uL — ABNORMAL HIGH (ref 0.00–0.07)
Basophils Absolute: 0.1 K/uL (ref 0.0–0.1)
Basophils Relative: 0 %
Eosinophils Absolute: 1.5 K/uL — ABNORMAL HIGH (ref 0.0–0.5)
Eosinophils Relative: 2 %
HCT: 25.7 % — ABNORMAL LOW (ref 36.0–46.0)
Hemoglobin: 7.7 g/dL — ABNORMAL LOW (ref 12.0–15.0)
Immature Granulocytes: 0 %
Lymphocytes Relative: 92 %
Lymphs Abs: 91.8 K/uL — ABNORMAL HIGH (ref 0.7–4.0)
MCH: 30.9 pg (ref 26.0–34.0)
MCHC: 30 g/dL (ref 30.0–36.0)
MCV: 103.2 fL — ABNORMAL HIGH (ref 80.0–100.0)
Monocytes Absolute: 2.4 K/uL — ABNORMAL HIGH (ref 0.1–1.0)
Monocytes Relative: 2 %
Neutro Abs: 4.3 K/uL (ref 1.7–7.7)
Neutrophils Relative %: 4 %
Platelet Count: 133 K/uL — ABNORMAL LOW (ref 150–400)
RBC: 2.49 MIL/uL — ABNORMAL LOW (ref 3.87–5.11)
RDW: 25 % — ABNORMAL HIGH (ref 11.5–15.5)
Smear Review: NORMAL
WBC Count: 100.2 K/uL (ref 4.0–10.5)
nRBC: 0 % (ref 0.0–0.2)

## 2024-07-07 LAB — PREPARE RBC (CROSSMATCH)

## 2024-07-08 ENCOUNTER — Inpatient Hospital Stay

## 2024-07-08 DIAGNOSIS — D649 Anemia, unspecified: Secondary | ICD-10-CM

## 2024-07-08 DIAGNOSIS — C9110 Chronic lymphocytic leukemia of B-cell type not having achieved remission: Secondary | ICD-10-CM

## 2024-07-08 MED ORDER — DIPHENHYDRAMINE HCL 25 MG PO CAPS
25.0000 mg | ORAL_CAPSULE | Freq: Once | ORAL | Status: AC
  Administered 2024-07-08: 25 mg via ORAL
  Filled 2024-07-08: qty 1

## 2024-07-08 MED ORDER — ACETAMINOPHEN 325 MG PO TABS
650.0000 mg | ORAL_TABLET | Freq: Once | ORAL | Status: AC
  Administered 2024-07-08: 650 mg via ORAL
  Filled 2024-07-08: qty 2

## 2024-07-08 MED ORDER — FUROSEMIDE 10 MG/ML IJ SOLN
20.0000 mg | Freq: Once | INTRAMUSCULAR | Status: AC
  Administered 2024-07-08: 20 mg via INTRAVENOUS
  Filled 2024-07-08: qty 2

## 2024-07-08 MED ORDER — SODIUM CHLORIDE 0.9% IV SOLUTION
250.0000 mL | INTRAVENOUS | Status: DC
  Administered 2024-07-08: 100 mL via INTRAVENOUS
  Filled 2024-07-08: qty 250

## 2024-07-09 ENCOUNTER — Telehealth: Admitting: Hospice and Palliative Medicine

## 2024-07-09 LAB — TYPE AND SCREEN
ABO/RH(D): O POS
Antibody Screen: POSITIVE
DAT, IgG: POSITIVE
DAT, complement: NEGATIVE
Donor AG Type: NEGATIVE
Unit division: 0

## 2024-07-09 LAB — BPAM RBC
Blood Product Expiration Date: 202508072359
ISSUE DATE / TIME: 202507220857
Unit Type and Rh: 9500

## 2024-07-15 ENCOUNTER — Telehealth: Payer: Self-pay

## 2024-07-15 ENCOUNTER — Other Ambulatory Visit: Payer: Self-pay | Admitting: Hospice and Palliative Medicine

## 2024-07-15 MED ORDER — POTASSIUM CHLORIDE CRYS ER 20 MEQ PO TBCR: 20.0000 meq | EXTENDED_RELEASE_TABLET | ORAL | 0 refills | Status: DC

## 2024-07-15 NOTE — Telephone Encounter (Signed)
 Callers name: Tracie Barber Relation to patient: daughter  Call back phone #: [phone]  Pharmacy (if applicable):   Issue/reason for call: daughter states that pt is experiencing swelling in her lower legs, and mild shortness of breath. Daughter states that she gave the pt 60mg  of lasix  for the past 2 days instead of her normal 40mg  with no changes. Bp's have been soft but normal 110's/50's. Pt she does not regularly weigh the pt but today she was 95lbs compare to last visit of 94lbs. Advise?

## 2024-07-15 NOTE — Telephone Encounter (Signed)
 Patient daughter called concerned with ankle edema and has given her an additional Lasix  for past 2 days.    Tracie placed a call to heart failure clinic but is not comfortable with the advice she was given to increase Lasix  to 80 mg daily x 3 days, take 20 KCL daily x 3 days. After 3 days, stop daily KCL and go back to Lasix  40 mg daily.  Especially since she is going out of town from this Thursday to next Wednesday.  She wanted to get a message to get Dr. B advice because she trust him very much.

## 2024-07-15 NOTE — Telephone Encounter (Signed)
 Spoke to daughter to relay new medication instruction. Pt to have lab work on Monday 07/21/24 and follow up with Ellouise Class, FNP scheduled. No further questions at this time.

## 2024-07-16 ENCOUNTER — Encounter: Payer: Self-pay | Admitting: Internal Medicine

## 2024-07-16 ENCOUNTER — Other Ambulatory Visit: Payer: Self-pay | Admitting: Hospice and Palliative Medicine

## 2024-07-17 ENCOUNTER — Other Ambulatory Visit: Payer: Self-pay | Admitting: *Deleted

## 2024-07-17 ENCOUNTER — Encounter: Payer: Self-pay | Admitting: Internal Medicine

## 2024-07-17 DIAGNOSIS — C9110 Chronic lymphocytic leukemia of B-cell type not having achieved remission: Secondary | ICD-10-CM

## 2024-07-17 DIAGNOSIS — I509 Heart failure, unspecified: Secondary | ICD-10-CM

## 2024-07-17 NOTE — Telephone Encounter (Signed)
 I spoke to patient's daughter-continue diuretics/potassium as recommended by heart failure clinic.  Will follow-up next week on the labs.; add BNP to labs.   GB

## 2024-07-21 ENCOUNTER — Inpatient Hospital Stay (HOSPITAL_BASED_OUTPATIENT_CLINIC_OR_DEPARTMENT_OTHER): Admitting: Hospice and Palliative Medicine

## 2024-07-21 ENCOUNTER — Inpatient Hospital Stay (HOSPITAL_BASED_OUTPATIENT_CLINIC_OR_DEPARTMENT_OTHER): Admitting: Internal Medicine

## 2024-07-21 ENCOUNTER — Encounter: Payer: Self-pay | Admitting: Internal Medicine

## 2024-07-21 ENCOUNTER — Inpatient Hospital Stay: Attending: Nurse Practitioner

## 2024-07-21 VITALS — BP 141/48 | HR 73 | Temp 96.3°F | Resp 19 | Ht 63.0 in | Wt 93.5 lb

## 2024-07-21 DIAGNOSIS — C9110 Chronic lymphocytic leukemia of B-cell type not having achieved remission: Secondary | ICD-10-CM

## 2024-07-21 DIAGNOSIS — Z79899 Other long term (current) drug therapy: Secondary | ICD-10-CM | POA: Diagnosis not present

## 2024-07-21 DIAGNOSIS — I130 Hypertensive heart and chronic kidney disease with heart failure and stage 1 through stage 4 chronic kidney disease, or unspecified chronic kidney disease: Secondary | ICD-10-CM | POA: Diagnosis not present

## 2024-07-21 DIAGNOSIS — G893 Neoplasm related pain (acute) (chronic): Secondary | ICD-10-CM | POA: Diagnosis not present

## 2024-07-21 DIAGNOSIS — Z515 Encounter for palliative care: Secondary | ICD-10-CM | POA: Diagnosis not present

## 2024-07-21 DIAGNOSIS — N189 Chronic kidney disease, unspecified: Secondary | ICD-10-CM | POA: Diagnosis not present

## 2024-07-21 DIAGNOSIS — I509 Heart failure, unspecified: Secondary | ICD-10-CM | POA: Insufficient documentation

## 2024-07-21 LAB — SAMPLE TO BLOOD BANK

## 2024-07-21 LAB — BASIC METABOLIC PANEL - CANCER CENTER ONLY
Anion gap: 7 (ref 5–15)
BUN: 65 mg/dL — ABNORMAL HIGH (ref 8–23)
CO2: 20 mmol/L — ABNORMAL LOW (ref 22–32)
Calcium: 8.4 mg/dL — ABNORMAL LOW (ref 8.9–10.3)
Chloride: 110 mmol/L (ref 98–111)
Creatinine: 1.69 mg/dL — ABNORMAL HIGH (ref 0.44–1.00)
GFR, Estimated: 29 mL/min — ABNORMAL LOW (ref >60)
Glucose, Bld: 135 mg/dL — ABNORMAL HIGH (ref 70–99)
Potassium: 3.9 mmol/L (ref 3.5–5.1)
Sodium: 137 mmol/L (ref 135–145)

## 2024-07-21 LAB — CBC WITH DIFFERENTIAL (CANCER CENTER ONLY)
Abs Immature Granulocytes: 0.11 K/uL — ABNORMAL HIGH (ref 0.00–0.07)
Basophils Absolute: 0 K/uL (ref 0.0–0.1)
Basophils Relative: 0 %
Eosinophils Absolute: 0.9 K/uL — ABNORMAL HIGH (ref 0.0–0.5)
Eosinophils Relative: 1 %
HCT: 27.6 % — ABNORMAL LOW (ref 36.0–46.0)
Hemoglobin: 8.4 g/dL — ABNORMAL LOW (ref 12.0–15.0)
Immature Granulocytes: 0 %
Lymphocytes Relative: 92 %
Lymphs Abs: 71.8 K/uL — ABNORMAL HIGH (ref 0.7–4.0)
MCH: 31.1 pg (ref 26.0–34.0)
MCHC: 30.4 g/dL (ref 30.0–36.0)
MCV: 102.2 fL — ABNORMAL HIGH (ref 80.0–100.0)
Monocytes Absolute: 1.6 K/uL — ABNORMAL HIGH (ref 0.1–1.0)
Monocytes Relative: 2 %
Neutro Abs: 3.7 K/uL (ref 1.7–7.7)
Neutrophils Relative %: 5 %
Platelet Count: 131 K/uL — ABNORMAL LOW (ref 150–400)
RBC: 2.7 MIL/uL — ABNORMAL LOW (ref 3.87–5.11)
RDW: 24.4 % — ABNORMAL HIGH (ref 11.5–15.5)
WBC Count: 77.7 K/uL (ref 4.0–10.5)
nRBC: 0 % (ref 0.0–0.2)

## 2024-07-21 LAB — LACTATE DEHYDROGENASE: LDH: 159 U/L (ref 98–192)

## 2024-07-21 LAB — BRAIN NATRIURETIC PEPTIDE: B Natriuretic Peptide: 1725.3 pg/mL — ABNORMAL HIGH (ref 0.0–100.0)

## 2024-07-21 NOTE — Progress Notes (Signed)
 Palliative Medicine [facility] at [facility] [phone] [phone]   Name: Lindsay [name] Date: 07/21/2024 MRN: [id number]  DOB: [birth year]  Patient Care Team: Valora Agent, MD as PCP - General (Family Medicine) Rennie Cindy SAUNDERS, MD as Consulting Physician (Oncology)    REASON FOR CONSULTATION: Lindsay [name] is a 89 y.o. female with multiple medical problems including CLL currently on surveillance because of intolerance to previous treatments, transfusion dependence, history of CHF and CKD.  Patient has had rising white counts.  Was referred to palliative care to address goals and manage ongoing symptoms.  SOCIAL HISTORY:     reports that she has never smoked. She has never used smokeless tobacco. She reports that she does not drink alcohol and does not use drugs.  ADVANCE DIRECTIVES:    CODE STATUS: DNR/DNI (DNR order signed on 04/14/24)  PAST MEDICAL HISTORY: Past Medical History:  Diagnosis Date   Arthritis    RHEUMATOID   CHF (congestive heart failure) (HCC)    CLL (chronic lymphocytic leukemia) (HCC)    Edema    MILD ANKLE OCCAS   Fibromyalgia    GERD (gastroesophageal reflux disease)    Hypertension    Kidney stones    RLS (restless legs syndrome)     PAST SURGICAL HISTORY:  Past Surgical History:  Procedure Laterality Date   ABDOMINAL HYSTERECTOMY     CATARACT EXTRACTION W/PHACO Left 02/07/2016   Procedure: CATARACT EXTRACTION PHACO AND INTRAOCULAR LENS PLACEMENT (IOC);  Surgeon: Steven Dingeldein, MD;  Location: [facility] ORS;  Service: Ophthalmology;  Laterality: Left;  US  01:33 AP% 24.7 CDE 39.57 fluid pack lot # [id number]   EXTRACORPOREAL SHOCK WAVE LITHOTRIPSY Right 07/03/2019   Procedure: EXTRACORPOREAL SHOCK WAVE LITHOTRIPSY (ESWL);  Surgeon: Kassie Ozell SAUNDERS, MD;  Location: [facility] ORS;  Service: Urology;  Laterality: Right;   EYE SURGERY     HIP ARTHROPLASTY Left 04/20/2020   Procedure: ARTHROPLASTY  BIPOLAR HIP (HEMIARTHROPLASTY);  Surgeon: Kathlynn Ozell, MD;  Location: [facility] ORS;  Service: Orthopedics;  Laterality: Left;   LITHOTRIPSY     TONSILLECTOMY      HEMATOLOGY/ONCOLOGY HISTORY:  Oncology History Overview Note  # LYMPHOCYTOSIS [25,K]/ANEMIA 9-10]; platelets-N; IgVH Somatic Hypermutation was not detected.56% OF NUCLEI POSITIVE FOR A 13Q DELETION; The phenotype is most consistent with a diagnosis of 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 Low level hypermetabolism consistent with CLL.2. Splenomegaly but no focal splenic lesions.  # April 2022- 13 ELINORE KIDD; OSKER- UNMUTATED  # MID April 2022- IBRUTINIB  280 mg x3 days [STOPPED sec ER/hypertensive emergency- 191/129]  # MAY 26th, 2022- start acalabrutinib  100 mg once a day [reduced dose]; intermittent; discontinued  September mid 2022. [Muscle cramps.]  # MAY 1st week- 2024- Zanubrutinib  80 mg; stopped after 1-2 weeks- sec to acute renal failure hyperkalemia/extreme fatigue etc.  Patient declined further therapies.       CLL (chronic lymphocytic leukemia) (HCC)  02/14/2021 Initial Diagnosis   CLL (chronic lymphocytic leukemia) (HCC)   04/07/2021 Cancer Staging   Staging form: Chronic Lymphocytic Leukemia / Small Lymphocytic Lymphoma, AJCC 8th Edition - Clinical: Modified Rai Stage III (Modified Rai risk: High, Binet: Stage C) - Signed by Rennie Cindy SAUNDERS, MD on 04/07/2021 Stage prefix: Initial diagnosis   12/16/2021 - 12/16/2021 Chemotherapy   Patient is on Treatment Plan : CLL - Rituximab q 4 weeks  ALLERGIES:  is allergic to gabapentin and demerol [meperidine].  MEDICATIONS:  Current Outpatient Medications  Medication Sig Dispense Refill   amitriptyline  (ELAVIL ) 10 MG tablet Take 20 mg by mouth at bedtime.     cyanocobalamin  (,VITAMIN B-12,) 1000 MCG/ML injection Inject into the  muscle every 14 (fourteen) days. Next injection 4/16     cyanocobalamin  (VITAMIN B12) 1000 MCG tablet Take 1,000 mcg by mouth daily.     DULoxetine  (CYMBALTA ) 20 MG capsule Take 20 mg by mouth daily.     Ferrous Sulfate 28 MG TABS Take 28 mg by mouth daily.     furosemide  (LASIX ) 20 MG tablet TAKE TWO TABLETS (40 MG TOTAL) BY MOUTH DAILY. 60 tablet 5   MELATONIN GUMMIES PO Take 3 mg by mouth at bedtime as needed.     ondansetron  (ZOFRAN ) 8 MG tablet TAKE ONE TABLET (8 MG TOTAL) BY MOUTH EVERY EIGHT HOURS AS NEEDED FOR NAUSEA OR VOMITING. 20 tablet 0   oxyCODONE  (OXY IR/ROXICODONE ) 5 MG immediate release tablet Take 1-2 tablets (5-10 mg total) by mouth every 4 (four) hours as needed for severe pain (pain score 7-10) (cancer associated pain G89.3). 90 tablet 0   oxyCODONE -acetaminophen  (PERCOCET/ROXICET) 5-325 MG tablet Take 1-2 tablets by mouth every 4 (four) hours as needed for severe pain (pain score 7-10). 90 tablet 0   polyethylene glycol powder (GLYCOLAX /MIRALAX ) 17 GM/SCOOP powder Take 17 g by mouth 2 (two) times daily as needed for moderate constipation. 255 g 0   potassium chloride  SA (KLOR-CON  M) 20 MEQ tablet Take 1 tablet (20 mEq total) by mouth as directed. By the heart failure clinic 30 tablet 0   No current facility-administered medications for this visit.    VITAL SIGNS: There were no vitals taken for this visit. There were no vitals filed for this visit.   Estimated body mass index is 16.56 kg/m as calculated from the following:   Height as of an earlier encounter on 07/21/24: 5' 3 (1.6 m).   Weight as of an earlier encounter on 07/21/24: 93 lb 8 oz (42.4 kg).  LABS: CBC:    Component Value Date/Time   WBC 77.7 (HH) 07/21/2024 1242   WBC 126.3 (HH) 06/11/2024 2234   HGB 8.4 (L) 07/21/2024 1242   HGB 14.1 03/06/2014 2049   HCT 27.6 (L) 07/21/2024 1242   HCT 42.9 03/06/2014 2049   PLT 131 (L) 07/21/2024 1242   PLT 240 03/06/2014 2049   MCV 102.2 (H) 07/21/2024 1242    MCV 88 03/06/2014 2049   NEUTROABS 3.7 07/21/2024 1242   NEUTROABS 7.3 (H) 03/06/2014 2049   LYMPHSABS 71.8 (H) 07/21/2024 1242   LYMPHSABS 6.6 (H) 03/06/2014 2049   MONOABS 1.6 (H) 07/21/2024 1242   MONOABS 1.3 (H) 03/06/2014 2049   EOSABS 0.9 (H) 07/21/2024 1242   EOSABS 0.4 03/06/2014 2049   BASOSABS 0.0 07/21/2024 1242   BASOSABS 0.1 03/06/2014 2049   Comprehensive Metabolic Panel:    Component Value Date/Time   NA 137 07/21/2024 1242   NA 138 09/04/2023 1154   NA 138 03/06/2014 2049   K 3.9 07/21/2024 1242   K 3.6 03/06/2014 2049   CL 110 07/21/2024 1242   CL 107 03/06/2014 2049   CO2 20 (L) 07/21/2024 1242   CO2 29 03/06/2014 2049   BUN 65 (H) 07/21/2024 1242   BUN 58 (H) 09/04/2023 1154   BUN 14 03/06/2014 2049   CREATININE 1.69 (H) 07/21/2024 1242   CREATININE 0.75 03/06/2014 2049  GLUCOSE 135 (H) 07/21/2024 1242   GLUCOSE 103 (H) 03/06/2014 2049   CALCIUM  8.4 (L) 07/21/2024 1242   CALCIUM  8.9 03/06/2014 2049   AST 19 06/11/2024 2234   AST 15 09/25/2023 0855   ALT 10 06/11/2024 2234   ALT 8 09/25/2023 0855   ALKPHOS 109 06/11/2024 2234   BILITOT 0.9 06/11/2024 2234   BILITOT 1.1 09/25/2023 0855   PROT 6.4 (L) 06/11/2024 2234   ALBUMIN 4.1 06/11/2024 2234    RADIOGRAPHIC STUDIES: No results found.   PERFORMANCE STATUS (ECOG) : 1 - Symptomatic but completely ambulatory  Review of Systems Unless otherwise noted, a complete review of systems is negative.  Physical Exam General: NAD Pulmonary: Unlabored Extremities: no edema, no joint deformities Skin: no rashes Neurological: Weakness but otherwise nonfocal  IMPRESSION: Follow-up visit.  Patient accompanied by family friend.  Patient reports he is doing well.  Denies any significant changes or concerns.  Denies any symptomatic complaints at present.  Reports that pain is well-controlled on current regimen of Percocet, which patient takes 3 times daily on average.  Reports stable performance status.   No recent weight loss.  PLAN: -Continue current scope of treatment - Continue Percocet as needed for pain - Daily bowel regimen -Ondansetron  as needed for nausea - Continue palliative care with plan for future hospice - DNR/DNI - Follow-up telephone visit 1 to 2 months  Case and plan discussed with Dr. Rennie  Patient expressed understanding and was in agreement with this plan. She also understands that She can call the clinic at any time with any questions, concerns, or complaints.     Time Total: 15 minutes  Visit consisted of counseling and education dealing with the complex and emotionally intense issues of symptom management and palliative care in the setting of serious and potentially life-threatening illness.Greater than 50%  of this time was spent counseling and coordinating care related to the above assessment and plan.  Signed by: Fonda Mower, PhD, NP-C

## 2024-07-21 NOTE — Assessment & Plan Note (Addendum)
#   CLL: [56% OF NUCLEI POSITIVE FOR A 13Q DELETION]; April 2022-PET scan shows significant lymphadenopathy above and below diaphragm; splenomegaly. Discontinued Ibrutinib  [hypertensive emergencies] acalabrutinib  [leg cramps even with minimum dose].Discontinued zanubrutinib . hypokalemia loss and renal failure/extreme fatigue etc.    # Progression of disease given the rising white count. Again declined any treatment options.   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  #OCT 2024- I sat- 11%- on  gentle iron [iron biglycinate; 28 mg ] 1 pill a day. HOLD retacrit  today-given patient's preference/lack of improvement. Today Hb- 8.4- HOLD Blood tomorrow.   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   #  s/p pallaitive care-as per patient wishes continue follow-up hospice.  Continue supportive care.  Discussed with Dr. Sherleen.  # ACP- DNR   # DISPOSITION: # cancel blood tomorrow # in 2 weeks- labs- cbc/HOLD tube; D-2 possible 1 unit PRBC blood; lasix  20 mg IV post transfusion # follow up in 4 weeks- MD/APP; labs- cbc/ bmp; LDH; HOLD tube; D-2- possible 1 unit PRBC-lasix  20 mg IV post transfusion Dr.B

## 2024-07-21 NOTE — Progress Notes (Signed)
 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 Low level hypermetabolism consistent with CLL.2. Splenomegaly but no focal splenic lesions.  # April 2022- 13 Q DEL; IGVH- UNMUTATED  # MID April 2022- IBRUTINIB  280 mg x3 days [STOPPED sec ER/hypertensive emergency- 191/129]  # MAY 26th, 2022- start acalabrutinib  100 mg once a day [reduced dose]; intermittent; discontinued  September mid 2022. [Muscle cramps.]  # MAY 1st week- 2024- Zanubrutinib  80 mg; stopped after 1-2 weeks- sec to acute renal failure hyperkalemia/extreme fatigue etc.  Patient declined further therapies.       CLL (chronic lymphocytic leukemia) (HCC)  02/14/2021 Initial Diagnosis   CLL (chronic lymphocytic leukemia) (HCC)   04/07/2021 Cancer Staging   Staging form: Chronic Lymphocytic Leukemia / Small Lymphocytic Lymphoma, AJCC 8th Edition - Clinical: Modified Rai Stage III (Modified Rai risk: High, Binet: Stage C) - Signed by Rennie Cindy SAUNDERS, MD on 04/07/2021 Stage prefix: Initial diagnosis   12/16/2021 - 12/16/2021 Chemotherapy   Patient is on Treatment Plan : CLL - Rituximab q 4 weeks      HISTORY OF PRESENTING ILLNESS: [name] Caucasian female patient/accompanied by friend, Lindsay [name].  Ambulating independently.  Lindsay [name] 89 y.o.  female symptomatic CLL currently on on surveillance because of intolerance to  ibrutinib /acalabrutinib /zanubrutinib -congestive heart failure is here for follow-up-currently on best supportive care with blood transfusions.  In the interim patient-recommended diuretics by congestive heart failure clinic.  She is compliant with Lasix  and potassium.   Patient is feeling pretty good. No fever no chills.Complains of poor appetite.   Also noticed significant increasing size of the neck lymph nodes/underarm lymph nodes.  Denies any worsening shortness she admits to be compliant with her Lasix .  Review of Systems  Constitutional:  Positive for malaise/fatigue and weight loss. Negative for chills, diaphoresis and fever.  HENT:  Negative for nosebleeds and sore throat.   Eyes:  Negative for double vision.  Respiratory:  Negative for cough, hemoptysis, sputum production, shortness of breath and wheezing.   Cardiovascular:  Negative for chest pain, palpitations, orthopnea and leg swelling.  Gastrointestinal:  Negative for abdominal pain, constipation, diarrhea, heartburn, melena, nausea and vomiting.  Genitourinary:  Negative for dysuria, frequency and urgency.  Musculoskeletal:  Positive for joint pain. Negative for back pain.  Skin: Negative.  Negative for itching and rash.  Neurological:  Negative for dizziness, tingling, focal weakness, weakness and headaches.  Endo/Heme/Allergies:  Bruises/bleeds easily.  Psychiatric/Behavioral:  Negative for depression. The patient is not nervous/anxious and does not have insomnia.      MEDICAL HISTORY:  Past Medical History:  Diagnosis Date   Arthritis    RHEUMATOID   CHF (congestive heart failure) (HCC)    CLL (chronic lymphocytic leukemia) (HCC)    Edema    MILD ANKLE OCCAS   Fibromyalgia    GERD (gastroesophageal reflux disease)    Hypertension    Kidney stones    RLS (restless legs syndrome)     SURGICAL HISTORY: Past Surgical History:  Procedure Laterality Date  ABDOMINAL HYSTERECTOMY     CATARACT EXTRACTION W/PHACO  Left 02/07/2016   Procedure: CATARACT EXTRACTION PHACO AND INTRAOCULAR LENS PLACEMENT (IOC);  Surgeon: Steven Dingeldein, MD;  Location: [facility] ORS;  Service: Ophthalmology;  Laterality: Left;  US  01:33 AP% 24.7 CDE 39.57 fluid pack lot # [id number]   EXTRACORPOREAL SHOCK WAVE LITHOTRIPSY Right 07/03/2019   Procedure: EXTRACORPOREAL SHOCK WAVE LITHOTRIPSY (ESWL);  Surgeon: Kassie Ozell SAUNDERS, MD;  Location: [facility] ORS;  Service: Urology;  Laterality: Right;   EYE SURGERY     HIP ARTHROPLASTY Left 04/20/2020   Procedure: ARTHROPLASTY BIPOLAR HIP (HEMIARTHROPLASTY);  Surgeon: Kathlynn Ozell, MD;  Location: [facility] ORS;  Service: Orthopedics;  Laterality: Left;   LITHOTRIPSY     TONSILLECTOMY      SOCIAL HISTORY: Social History   Socioeconomic History   Marital status: Widowed    Spouse name: Not on file   Number of children: Not on file   Years of education: Not on file   Highest education level: Not on file  Occupational History   Not on file  Tobacco Use   Smoking status: Never   Smokeless tobacco: Never  Substance and Sexual Activity   Alcohol use: No   Drug use: Never   Sexual activity: Not Currently  Other Topics Concern   Not on file  Social History Narrative   Lives with daughter, in [place]. 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    Social Drivers of Corporate investment banker Strain: Low Risk  (12/24/2023)   Received from [facility]   Overall Financial Resource Strain (CARDIA)    Difficulty of Paying Living Expenses: Not hard at all  Food Insecurity: No Food Insecurity (12/24/2023)   Received from [facility]   Hunger Vital Sign    Within the past 12 months, the food you bought just didn't last and you didn't have money to get more.: Never true    Within the past 12 months, you worried that your food would run out before you got the money to buy more.: Never true  Transportation Needs: No Transportation Needs (12/24/2023)   Received  from [facility]   PRAPARE - Transportation    Lack of Transportation (Non-Medical): No    In the past 12 months, has lack of transportation kept you from medical appointments or from getting medications?: No  Physical Activity: Not on file  Stress: Not on file  Social Connections: Not on file  Intimate Partner Violence: Not At Risk (09/19/2023)   Humiliation, Afraid, Rape, and Kick questionnaire    Fear of Current or Ex-Partner: No    Emotionally Abused: No    Physically Abused: No    Sexually Abused: No    FAMILY HISTORY: Family History  Problem Relation Age of Onset   Cancer Mother        uterine cancer    ALLERGIES:  is allergic to gabapentin and demerol [meperidine].  MEDICATIONS:  Current Outpatient Medications  Medication Sig Dispense Refill   amitriptyline  (ELAVIL ) 10 MG tablet Take 20 mg by mouth at bedtime.     cyanocobalamin  (,VITAMIN B-12,) 1000 MCG/ML injection Inject into the muscle every 14 (fourteen) days. Next injection 4/16     cyanocobalamin  (VITAMIN B12) 1000 MCG tablet Take 1,000 mcg by mouth daily.     DULoxetine  (CYMBALTA ) 20 MG capsule Take 20 mg by mouth daily.     Ferrous Sulfate 28 MG TABS Take 28 mg by mouth daily.  furosemide  (LASIX ) 20 MG tablet TAKE TWO TABLETS (40 MG TOTAL) BY MOUTH DAILY. 60 tablet 5   MELATONIN GUMMIES PO Take 3 mg by mouth at bedtime as needed.     ondansetron  (ZOFRAN ) 8 MG tablet TAKE ONE TABLET (8 MG TOTAL) BY MOUTH EVERY EIGHT HOURS AS NEEDED FOR NAUSEA OR VOMITING. 20 tablet 0   oxyCODONE  (OXY IR/ROXICODONE ) 5 MG immediate release tablet Take 1-2 tablets (5-10 mg total) by mouth every 4 (four) hours as needed for severe pain (pain score 7-10) (cancer associated pain G89.3). 90 tablet 0   oxyCODONE -acetaminophen  (PERCOCET/ROXICET) 5-325 MG tablet Take 1-2 tablets by mouth every 4 (four) hours as needed for severe pain (pain score 7-10). 90 tablet 0   polyethylene glycol powder (GLYCOLAX /MIRALAX ) 17  GM/SCOOP powder Take 17 g by mouth 2 (two) times daily as needed for moderate constipation. 255 g 0   potassium chloride  SA (KLOR-CON  M) 20 MEQ tablet Take 1 tablet (20 mEq total) by mouth as directed. By the heart failure clinic 30 tablet 0   No current facility-administered medications for this visit.    PHYSICAL EXAMINATION: ECOG PERFORMANCE STATUS: 0 - Asymptomatic  Vitals:   07/21/24 1307  BP: (!) 141/48  Pulse: 73  Resp: 19  Temp: (!) 96.3 F (35.7 C)  SpO2: 99%     Filed Weights   07/21/24 1307  Weight: 93 lb 8 oz (42.4 kg)   Bulky bilateral neck adenopathy; underarm adenopathy.  Physical Exam HENT:     Head: Normocephalic and atraumatic.     Mouth/Throat:     Pharynx: No oropharyngeal exudate.  Eyes:     Pupils: Pupils are equal, round, and reactive to light.  Cardiovascular:     Rate and Rhythm: Normal rate and regular rhythm.  Pulmonary:     Effort: Pulmonary effort is normal. No respiratory distress.     Breath sounds: Normal breath sounds. No wheezing.  Abdominal:     General: Bowel sounds are normal. There is no distension.     Palpations: Abdomen is soft. There is no mass.     Tenderness: There is no abdominal tenderness. There is no guarding or rebound.  Musculoskeletal:        General: No tenderness. Normal range of motion.     Cervical back: Normal range of motion and neck supple.  Skin:    General: Skin is warm.     Comments: Chronic multiple bruises noted.  Neurological:     Mental Status: She is alert and oriented to person, place, and time.  Psychiatric:        Mood and Affect: Affect normal.      LABORATORY DATA:  I have reviewed the data as listed Lab Results  Component Value Date   WBC 77.7 (HH) 07/21/2024   HGB 8.4 (L) 07/21/2024   HCT 27.6 (L) 07/21/2024   MCV 102.2 (H) 07/21/2024   PLT 131 (L) 07/21/2024   Recent Labs    04/13/24 1123 04/28/24 1446 06/09/24 1006 06/11/24 2234 06/16/24 1009 06/23/24 1247 07/21/24 1242   NA 137   < > 136 139 139 137 137  K 4.0   < > 4.5 4.5 4.5 4.3 3.9  CL 111   < > 105 106 107 108 110  CO2 20*   < > 20* 22 24 21* 20*  GLUCOSE 172*   < > 165* 105* 134* 182* 135*  BUN 51*   < > 72* 74* 69* 75* 65*  CREATININE 1.54*   < >  2.00* 1.91* 1.60* 1.79* 1.69*  CALCIUM  8.4*   < > 8.2* 8.3* 8.4* 8.6* 8.4*  GFRNONAA 32*   < > 24* 25* 31* 27* 29*  PROT 6.4*  --  5.9* 6.4*  --   --   --   ALBUMIN 4.0  --  3.9 4.1  --   --   --   AST 19  --  16 19  --   --   --   ALT 8  --  9 10  --   --   --   ALKPHOS 107  --  108 109  --   --   --   BILITOT 1.0  --  0.9 0.9  --   --   --    < > = values in this interval not displayed.    RADIOGRAPHIC STUDIES: I have personally reviewed the radiological images as listed and agreed with the findings in the report. No results found.    ASSESSMENT & PLAN:   CLL (chronic lymphocytic leukemia) (HCC) # CLL: [56% OF NUCLEI POSITIVE FOR A 13Q DELETION]; April 2022-PET scan shows significant lymphadenopathy above and below diaphragm; splenomegaly. Discontinued Ibrutinib  [hypertensive emergencies] acalabrutinib  [leg cramps even with minimum dose].Discontinued zanubrutinib . hypokalemia loss and renal failure/extreme fatigue etc.    # Progression of disease given the rising white count. Again declined any treatment options.   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  #OCT 2024- I sat- 11%- on  gentle iron [iron biglycinate; 28 mg ] 1 pill a day. HOLD retacrit  today-given patient's preference/lack of improvement. Today Hb- 8.4- HOLD Blood tomorrow.   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   #  s/p pallaitive care-as per patient wishes continue follow-up hospice.  Continue supportive care.  Discussed with Dr. Sherleen.  # ACP- DNR   # DISPOSITION: #  cancel blood tomorrow # in 2 weeks- labs- cbc/HOLD tube; D-2 possible 1 unit PRBC blood; lasix  20 mg IV post transfusion # follow up in 4 weeks- MD/APP; labs- cbc/ bmp; LDH; HOLD tube; D-2- possible 1 unit PRBC-lasix  20 mg IV post transfusion Dr.B   All questions were answered. The patient knows to call the clinic with any problems, questions or concerns.    Cindy JONELLE Joe, MD 07/21/2024 2:06 PM

## 2024-07-21 NOTE — Progress Notes (Signed)
 Critical Lab called by Luke in the lab. WBC 77.7 Readback. MD notified.

## 2024-07-21 NOTE — Progress Notes (Signed)
 Patient has no concerns

## 2024-07-22 ENCOUNTER — Inpatient Hospital Stay

## 2024-07-22 ENCOUNTER — Other Ambulatory Visit: Payer: Self-pay

## 2024-07-22 DIAGNOSIS — C9110 Chronic lymphocytic leukemia of B-cell type not having achieved remission: Secondary | ICD-10-CM

## 2024-07-23 ENCOUNTER — Telehealth: Payer: Self-pay | Admitting: Family

## 2024-07-23 NOTE — Telephone Encounter (Signed)
 Called to confirm/remind patient of their appointment at the [facility] on 07/24/24.   Appointment:   [] Confirmed  [x] Left mess   [] No answer/No voice mail  [] VM Full/unable to leave message  [] Phone not in service  Patient reminded to bring all medications and/or complete list.  Confirmed patient has transportation. Gave directions, instructed to utilize valet parking.

## 2024-07-24 ENCOUNTER — Telehealth: Payer: Self-pay | Admitting: Family

## 2024-07-24 ENCOUNTER — Encounter: Admitting: Family

## 2024-07-24 NOTE — Telephone Encounter (Signed)
 Called and spoke to daughter Randine. Pt is doing well. States that after medication changes last week pt is not having any swelling or shortness of breath. Her weight has been steady. States her mother has been wearing her compression socks daily.

## 2024-07-24 NOTE — Telephone Encounter (Signed)
 Received message from oncology that BNP was elevated at 1725.3. Will have staff reach out to patient to assess for symptoms and weight gain.

## 2024-07-28 ENCOUNTER — Telehealth: Payer: Self-pay | Admitting: Family

## 2024-07-28 NOTE — Telephone Encounter (Signed)
 Called to confirm/remind patient of their appointment at the [facility] on 07/29/24.   Appointment:   [x] Confirmed  [] Left mess   [] No answer/No voice mail  [] VM Full/unable to leave message  [] Phone not in service  Patient reminded to bring all medications and/or complete list.  Confirmed patient has transportation. Gave directions, instructed to utilize valet parking.

## 2024-07-28 NOTE — Progress Notes (Deleted)
 Advanced [facility] Failure Clinic Note    PCP: Valora Agent, MD  Primary Cardiologist: Darliss Rogue, MD   Chief Complaint:    HPI:  Lindsay [name] is a 89 y/o female with a history of HTN, CKD, CLL (not taking chemo), anemia, LBBB, IBS, fibromyalgia, GERD, RLS and heart failure. She was unable to tolerate treatment of CLL with zanubrutinib  due to hyperkalemia, renal failure and extreme fatigue and has elected to stop any other treatment for this.  Echo 05/30/22: EF of 45-50% along with mild/moderate MR  Admitted 06/18/23 due to shortness of breath due to HF exacerbation. Chest x-ray showed cardiomegaly and interstitial edema. BNP 1254, troponin 80--> 116. IV diuresed. Echo 06/19/23: EF 40-45% along with mild LVH, Grade II DD, mildly elevated PA pressure, mild/ moderate MR with moderate AS with Aortic valve area, by VTI measures 1.00 cm. Aortic valve mean gradient measures 13.0 mmHg.   Admitted 09/19/23 due to shortness of breath. Had SIRS without infection likely d/t anemia with history of CLL. Also found to be anemic with HG 5.7. Received 2 units of PRBC's with IV lasix  afterwards. Symptoms improved. Entresto  was stopped due to hypotension.   Was in the ED 06/11/24 with acute onset of bilateral leg swelling. Was given 2 units of PRBC's the day prior. Given IV lasix  with improvement of symptoms.   She presents today, with her daughter, for a HF follow-up visit with a chief complaint of   Was advised to take increased lasix  w/ potassium for a few days due to edema/ weight gain. Now back to furosemide  40mg  daily.   Continues to decline treatment of her CLL. Has had side effects from previous medications and doesn't desire to go through that again.   ROS: All systems negative except what is listed in HPI, PMH and Problem List    Past Medical History:  Diagnosis Date   Arthritis    RHEUMATOID   CHF (congestive heart failure) (HCC)    CLL (chronic lymphocytic leukemia) (HCC)    Edema     MILD ANKLE OCCAS   Fibromyalgia    GERD (gastroesophageal reflux disease)    Hypertension    Kidney stones    RLS (restless legs syndrome)     Current Outpatient Medications  Medication Sig Dispense Refill   amitriptyline  (ELAVIL ) 10 MG tablet Take 20 mg by mouth at bedtime.     cyanocobalamin  (,VITAMIN B-12,) 1000 MCG/ML injection Inject into the muscle every 14 (fourteen) days. Next injection 4/16     cyanocobalamin  (VITAMIN B12) 1000 MCG tablet Take 1,000 mcg by mouth daily.     DULoxetine  (CYMBALTA ) 20 MG capsule Take 20 mg by mouth daily.     Ferrous Sulfate 28 MG TABS Take 28 mg by mouth daily.     furosemide  (LASIX ) 20 MG tablet TAKE TWO TABLETS (40 MG TOTAL) BY MOUTH DAILY. 60 tablet 5   MELATONIN GUMMIES PO Take 3 mg by mouth at bedtime as needed.     ondansetron  (ZOFRAN ) 8 MG tablet TAKE ONE TABLET (8 MG TOTAL) BY MOUTH EVERY EIGHT HOURS AS NEEDED FOR NAUSEA OR VOMITING. 20 tablet 0   oxyCODONE  (OXY IR/ROXICODONE ) 5 MG immediate release tablet Take 1-2 tablets (5-10 mg total) by mouth every 4 (four) hours as needed for severe pain (pain score 7-10) (cancer associated pain G89.3). 90 tablet 0   oxyCODONE -acetaminophen  (PERCOCET/ROXICET) 5-325 MG tablet Take 1-2 tablets by mouth every 4 (four) hours as needed for severe pain (pain score 7-10). 90 tablet  0   polyethylene glycol powder (GLYCOLAX /MIRALAX ) 17 GM/SCOOP powder Take 17 g by mouth 2 (two) times daily as needed for moderate constipation. 255 g 0   potassium chloride  SA (KLOR-CON  M) 20 MEQ tablet Take 1 tablet (20 mEq total) by mouth as directed. By the heart failure clinic 30 tablet 0   No current facility-administered medications for this visit.    Allergies  Allergen Reactions   Gabapentin Swelling    05/01/23: Pt stated she has facial swelling when taking Gabapentin.   Demerol [Meperidine]       Social History   Socioeconomic History   Marital status: Widowed    Spouse name: Not on file   Number of children:  Not on file   Years of education: Not on file   Highest education level: Not on file  Occupational History   Not on file  Tobacco Use   Smoking status: Never   Smokeless tobacco: Never  Substance and Sexual Activity   Alcohol use: No   Drug use: Never   Sexual activity: Not Currently  Other Topics Concern   Not on file  Social History Narrative   Lives with daughter, in [place]. 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    Social Drivers of Corporate investment banker Strain: Low Risk  (12/24/2023)   Received from [facility]   Overall Financial Resource Strain (CARDIA)    Difficulty of Paying Living Expenses: Not hard at all  Food Insecurity: No Food Insecurity (12/24/2023)   Received from [facility]   Hunger Vital Sign    Within the past 12 months, the food you bought just didn't last and you didn't have money to get more.: Never true    Within the past 12 months, you worried that your food would run out before you got the money to buy more.: Never true  Transportation Needs: No Transportation Needs (12/24/2023)   Received from [facility]   PRAPARE - Transportation    Lack of Transportation (Non-Medical): No    In the past 12 months, has lack of transportation kept you from medical appointments or from getting medications?: No  Physical Activity: Not on file  Stress: Not on file  Social Connections: Not on file  Intimate Partner Violence: Not At Risk (09/19/2023)   Humiliation, Afraid, Rape, and Kick questionnaire    Fear of Current or Ex-Partner: No    Emotionally Abused: No    Physically Abused: No    Sexually Abused: No      Family History  Problem Relation Age of Onset   Cancer Mother        uterine cancer      PHYSICAL EXAM:  General: Thin appearing. No resp difficulty HEENT: normal Neck: supple, no JVD Cor: Regular rhythm, rate. No rubs, gallops or murmurs Lungs: clear Abdomen: soft,  nontender, nondistended. Extremities: no cyanosis, clubbing, rash, edema Neuro: alert & oriented X 3. Moves all 4 extremities w/o difficulty. Affect pleasant   ECG: not done   ASSESSMENT & PLAN:  1: NICM with mildly reduced ejection fraction- - likely due to HTN/ CLL - NYHA [name] II - euvolemic - weighing daily; reminded to call for an overnight weight gain of > 2 pounds or a weekly weight gain of > 5 pounds - weight 94 from last visit here 1 month ago - Echo 05/30/22: EF of 45-50% along with mild/moderate MR - Echo 06/19/23: EF 40-45% along with mild LVH,  Grade II DD, mildly elevated PA pressure, mild/ moderate MR with moderate AS with Aortic valve area, by VTI measures 1.00 cm. Aortic valve mean gradient measures 13.0 mmHg.  - continue furosemide  40mg  daily - has had hypotension in the past so may not be able to tolerate ARB/ ARNi - has had elevated K+ in the past so unable to use spironolactone - history of recurrent UTI so will not be a candidate for SGLT2 - saw cardiology ([facility]) 06/25 - not candidate for advanced therapies due to progressing CLL - BNP 07/21/24 was 1725.3  2: HTN- - BP  - saw PCP Loree) 03/25 - BMP 07/21/24 reviewed: sodium 137, potassium 3.9, creatinine 1.69 and GFR 29  3: CLL- - saw oncology Oza) 08/25 along w/ palliative care ([facility]) - WBC 07/21/24 was 77.7 - disease progressing; continues to decline treatment  4: 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

## 2024-07-29 ENCOUNTER — Encounter: Admitting: Family

## 2024-07-29 ENCOUNTER — Telehealth: Payer: Self-pay | Admitting: Family

## 2024-07-29 NOTE — Telephone Encounter (Signed)
 Patient did not show for her Heart Failure Clinic appointment on 07/29/24.

## 2024-08-04 ENCOUNTER — Other Ambulatory Visit: Payer: Self-pay | Admitting: *Deleted

## 2024-08-04 ENCOUNTER — Telehealth: Payer: Self-pay | Admitting: Internal Medicine

## 2024-08-04 ENCOUNTER — Telehealth: Payer: Self-pay | Admitting: *Deleted

## 2024-08-04 ENCOUNTER — Inpatient Hospital Stay

## 2024-08-04 DIAGNOSIS — D649 Anemia, unspecified: Secondary | ICD-10-CM

## 2024-08-04 DIAGNOSIS — C9110 Chronic lymphocytic leukemia of B-cell type not having achieved remission: Secondary | ICD-10-CM

## 2024-08-04 LAB — CBC WITH DIFFERENTIAL (CANCER CENTER ONLY)
Abs Immature Granulocytes: 0.16 K/uL — ABNORMAL HIGH (ref 0.00–0.07)
Basophils Absolute: 0.1 K/uL (ref 0.0–0.1)
Basophils Relative: 0 %
Eosinophils Absolute: 1.5 K/uL — ABNORMAL HIGH (ref 0.0–0.5)
Eosinophils Relative: 2 %
HCT: 24.3 % — ABNORMAL LOW (ref 36.0–46.0)
Hemoglobin: 7.4 g/dL — ABNORMAL LOW (ref 12.0–15.0)
Immature Granulocytes: 0 %
Lymphocytes Relative: 91 %
Lymphs Abs: 75.9 K/uL — ABNORMAL HIGH (ref 0.7–4.0)
MCH: 33.2 pg (ref 26.0–34.0)
MCHC: 30.5 g/dL (ref 30.0–36.0)
MCV: 109 fL — ABNORMAL HIGH (ref 80.0–100.0)
Monocytes Absolute: 2.1 K/uL — ABNORMAL HIGH (ref 0.1–1.0)
Monocytes Relative: 3 %
Neutro Abs: 3.7 K/uL (ref 1.7–7.7)
Neutrophils Relative %: 4 %
Platelet Count: 141 K/uL — ABNORMAL LOW (ref 150–400)
RBC: 2.23 MIL/uL — ABNORMAL LOW (ref 3.87–5.11)
RDW: 23.8 % — ABNORMAL HIGH (ref 11.5–15.5)
Smear Review: NORMAL
WBC Count: 83.5 K/uL (ref 4.0–10.5)
nRBC: 0 % (ref 0.0–0.2)

## 2024-08-04 LAB — BRAIN NATRIURETIC PEPTIDE: B Natriuretic Peptide: 1446.7 pg/mL — ABNORMAL HIGH (ref 0.0–100.0)

## 2024-08-04 NOTE — Telephone Encounter (Signed)
 Critical lab called by Luke in lab. WBC 83.5. Readback. Dr B notified.

## 2024-08-05 ENCOUNTER — Ambulatory Visit: Attending: Family | Admitting: Family

## 2024-08-05 ENCOUNTER — Inpatient Hospital Stay

## 2024-08-05 ENCOUNTER — Encounter: Payer: Self-pay | Admitting: Family

## 2024-08-05 ENCOUNTER — Other Ambulatory Visit: Payer: Self-pay | Admitting: Hospice and Palliative Medicine

## 2024-08-05 ENCOUNTER — Other Ambulatory Visit

## 2024-08-05 VITALS — BP 154/51 | HR 77 | Wt 94.1 lb

## 2024-08-05 DIAGNOSIS — I5022 Chronic systolic (congestive) heart failure: Secondary | ICD-10-CM | POA: Insufficient documentation

## 2024-08-05 DIAGNOSIS — D519 Vitamin B12 deficiency anemia, unspecified: Secondary | ICD-10-CM

## 2024-08-05 DIAGNOSIS — K589 Irritable bowel syndrome without diarrhea: Secondary | ICD-10-CM | POA: Diagnosis not present

## 2024-08-05 DIAGNOSIS — K219 Gastro-esophageal reflux disease without esophagitis: Secondary | ICD-10-CM | POA: Diagnosis not present

## 2024-08-05 DIAGNOSIS — Z79899 Other long term (current) drug therapy: Secondary | ICD-10-CM | POA: Insufficient documentation

## 2024-08-05 DIAGNOSIS — C9110 Chronic lymphocytic leukemia of B-cell type not having achieved remission: Secondary | ICD-10-CM

## 2024-08-05 DIAGNOSIS — D649 Anemia, unspecified: Secondary | ICD-10-CM

## 2024-08-05 DIAGNOSIS — D630 Anemia in neoplastic disease: Secondary | ICD-10-CM | POA: Insufficient documentation

## 2024-08-05 DIAGNOSIS — I10 Essential (primary) hypertension: Secondary | ICD-10-CM | POA: Diagnosis not present

## 2024-08-05 DIAGNOSIS — Z87440 Personal history of urinary (tract) infections: Secondary | ICD-10-CM | POA: Insufficient documentation

## 2024-08-05 DIAGNOSIS — G2581 Restless legs syndrome: Secondary | ICD-10-CM | POA: Insufficient documentation

## 2024-08-05 DIAGNOSIS — I447 Left bundle-branch block, unspecified: Secondary | ICD-10-CM | POA: Diagnosis not present

## 2024-08-05 DIAGNOSIS — M797 Fibromyalgia: Secondary | ICD-10-CM | POA: Insufficient documentation

## 2024-08-05 DIAGNOSIS — I428 Other cardiomyopathies: Secondary | ICD-10-CM | POA: Diagnosis not present

## 2024-08-05 DIAGNOSIS — N189 Chronic kidney disease, unspecified: Secondary | ICD-10-CM | POA: Insufficient documentation

## 2024-08-05 DIAGNOSIS — I130 Hypertensive heart and chronic kidney disease with heart failure and stage 1 through stage 4 chronic kidney disease, or unspecified chronic kidney disease: Secondary | ICD-10-CM | POA: Diagnosis not present

## 2024-08-05 LAB — PREPARE RBC (CROSSMATCH)

## 2024-08-05 MED ORDER — FUROSEMIDE 10 MG/ML IJ SOLN
20.0000 mg | Freq: Once | INTRAMUSCULAR | Status: AC
  Administered 2024-08-05: 20 mg via INTRAVENOUS
  Filled 2024-08-05: qty 2

## 2024-08-05 MED ORDER — ACETAMINOPHEN 325 MG PO TABS
650.0000 mg | ORAL_TABLET | Freq: Once | ORAL | Status: AC
  Administered 2024-08-05: 650 mg via ORAL
  Filled 2024-08-05: qty 2

## 2024-08-05 MED ORDER — SODIUM CHLORIDE 0.9% IV SOLUTION
250.0000 mL | INTRAVENOUS | Status: DC
  Administered 2024-08-05: 100 mL via INTRAVENOUS
  Filled 2024-08-05: qty 250

## 2024-08-05 MED ORDER — DIPHENHYDRAMINE HCL 25 MG PO CAPS
25.0000 mg | ORAL_CAPSULE | Freq: Once | ORAL | Status: AC
  Administered 2024-08-05: 25 mg via ORAL
  Filled 2024-08-05: qty 1

## 2024-08-05 NOTE — Patient Instructions (Addendum)
 Medication Changes:  Double your Lasix  and Potassium every other day for 3 doses (Wednesday, Friday and Sunday)   Lab Work:  Go over to the [facility]. Go pass the gift shop and have your blood work completed in 1 week.  We will only call you if the results are abnormal or if the provider would like to make medication changes.  No news is good news.   Follow-Up in: 2 months with Ellouise Class, FNP.   Thank you for choosing  [facility].    At the Advanced [facility] Clinic, you and your health needs are our priority. We have a designated team specialized in the treatment of Heart Failure. This Care Team includes your primary Heart Failure Specialized Cardiologist (physician), Advanced Practice Providers (APPs- Physician Assistants and Nurse Practitioners), and Pharmacist who all work together to provide you with the care you need, when you need it.   You may see any of the following providers on your designated Care Team at your next follow up:  Dr. Toribio Fuel Dr. Ezra Shuck Dr. Ria Commander Dr. Morene Brownie Ellouise Class, FNP Jaun Bash, RPH-CPP  Please be sure to bring in all your medications bottles to every appointment.   Need to Contact Us :  If you have any questions or concerns before your next appointment please send us  a message through [place] or call our office at [phone].    TO LEAVE A MESSAGE FOR THE NURSE SELECT OPTION 2, PLEASE LEAVE A MESSAGE INCLUDING: YOUR NAME DATE OF BIRTH CALL BACK NUMBER REASON FOR CALL**this is important as we prioritize the call backs  YOU WILL RECEIVE A CALL BACK THE SAME DAY AS LONG AS YOU CALL BEFORE 4:00 PM

## 2024-08-05 NOTE — Progress Notes (Signed)
 Advanced Heart Failure Clinic Note    PCP: Valora Agent, MD  Primary Cardiologist: Darliss Rogue, MD   Chief Complaint: shortness of breath   HPI:  Lindsay [name] is a 89 y/o female with a history of HTN, CKD, CLL (not taking chemo), anemia, LBBB, IBS, fibromyalgia, GERD, RLS and heart failure. She was unable to tolerate treatment of CLL with zanubrutinib  due to hyperkalemia, renal failure and extreme fatigue and has elected to stop any other treatment for this.  Echo 05/30/22: EF of 45-50% along with mild/moderate MR  Admitted 06/18/23 due to shortness of breath due to HF exacerbation. Chest x-ray showed cardiomegaly and interstitial edema. BNP 1254, troponin 80--> 116. IV diuresed. Echo 06/19/23: EF 40-45% along with mild LVH, Grade II DD, mildly elevated PA pressure, mild/ moderate MR with moderate AS with Aortic valve area, by VTI measures 1.00 cm. Aortic valve mean gradient measures 13.0 mmHg.   Admitted 09/19/23 due to shortness of breath. Had SIRS without infection likely d/t anemia with history of CLL. Also found to be anemic with HG 5.7. Received 2 units of PRBC's with IV lasix  afterwards. Symptoms improved. Entresto  was stopped due to hypotension.   Was in the ED 06/11/24 with acute onset of bilateral leg swelling. Was given 2 units of PRBC's the day prior. Given IV lasix  with improvement of symptoms.   She presents today, with her daughter, for a HF follow-up visit with a chief complaint of fatigue. Has associated shortness of breath, occasional pedal edema. Was advised to take increased lasix  w/ potassium for a few days due to edema/ weight gain. Now back to furosemide  40mg  daily. Received blood transfusion earlier today.    Continues to decline treatment of her CLL. Has had side effects from previous medications and doesn't desire to go through that again.   ROS: All systems negative except what is listed in HPI, PMH and Problem List   Past Medical History:  Diagnosis Date    Arthritis    RHEUMATOID   CHF (congestive heart failure) (HCC)    CLL (chronic lymphocytic leukemia) (HCC)    Edema    MILD ANKLE OCCAS   Fibromyalgia    GERD (gastroesophageal reflux disease)    Hypertension    Kidney stones    RLS (restless legs syndrome)     Current Outpatient Medications  Medication Sig Dispense Refill   amitriptyline  (ELAVIL ) 10 MG tablet Take 20 mg by mouth at bedtime.     cyanocobalamin  (,VITAMIN B-12,) 1000 MCG/ML injection Inject into the muscle every 14 (fourteen) days. Next injection 4/16     cyanocobalamin  (VITAMIN B12) 1000 MCG tablet Take 1,000 mcg by mouth daily.     DULoxetine  (CYMBALTA ) 20 MG capsule Take 20 mg by mouth daily.     Ferrous Sulfate 28 MG TABS Take 28 mg by mouth daily.     furosemide  (LASIX ) 20 MG tablet TAKE TWO TABLETS (40 MG TOTAL) BY MOUTH DAILY. 60 tablet 5   MELATONIN GUMMIES PO Take 3 mg by mouth at bedtime as needed.     ondansetron  (ZOFRAN ) 8 MG tablet TAKE ONE TABLET (8 MG TOTAL) BY MOUTH EVERY EIGHT HOURS AS NEEDED FOR NAUSEA OR VOMITING. 20 tablet 0   oxyCODONE  (OXY IR/ROXICODONE ) 5 MG immediate release tablet Take 1-2 tablets (5-10 mg total) by mouth every 4 (four) hours as needed for severe pain (pain score 7-10) (cancer associated pain G89.3). 90 tablet 0   oxyCODONE -acetaminophen  (PERCOCET/ROXICET) 5-325 MG tablet Take 1-2 tablets by mouth  every 4 (four) hours as needed for severe pain (pain score 7-10). 90 tablet 0   polyethylene glycol powder (GLYCOLAX /MIRALAX ) 17 GM/SCOOP powder Take 17 g by mouth 2 (two) times daily as needed for moderate constipation. 255 g 0   potassium chloride  SA (KLOR-CON  M) 20 MEQ tablet Take 1 tablet (20 mEq total) by mouth as directed. By the heart failure clinic 30 tablet 0   No current facility-administered medications for this visit.   Facility-Administered Medications Ordered in Other Visits  Medication Dose Route Frequency Provider Last Rate Last Admin   0.9 %  sodium chloride  infusion  (Manually program via Guardrails IV Fluids)  250 mL Intravenous Continuous Brahmanday, Govinda R, MD 10 mL/hr at 08/05/24 1309 100 mL at 08/05/24 1309    Allergies  Allergen Reactions   Gabapentin Swelling    05/01/23: Pt stated she has facial swelling when taking Gabapentin.   Demerol [Meperidine]       Social History   Socioeconomic History   Marital status: Widowed    Spouse name: Not on file   Number of children: Not on file   Years of education: Not on file   Highest education level: Not on file  Occupational History   Not on file  Tobacco Use   Smoking status: Never   Smokeless tobacco: Never  Substance and Sexual Activity   Alcohol use: No   Drug use: Never   Sexual activity: Not Currently  Other Topics Concern   Not on file  Social History Narrative   Lives with daughter, in [place]. 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    Social Drivers of Corporate investment banker Strain: Low Risk  (12/24/2023)   Received from [facility]   Overall Financial Resource Strain (CARDIA)    Difficulty of Paying Living Expenses: Not hard at all  Food Insecurity: No Food Insecurity (12/24/2023)   Received from [facility]   Hunger Vital Sign    Within the past 12 months, the food you bought just didn't last and you didn't have money to get more.: Never true    Within the past 12 months, you worried that your food would run out before you got the money to buy more.: Never true  Transportation Needs: No Transportation Needs (12/24/2023)   Received from [facility]   PRAPARE - Transportation    Lack of Transportation (Non-Medical): No    In the past 12 months, has lack of transportation kept you from medical appointments or from getting medications?: No  Physical Activity: Not on file  Stress: Not on file  Social Connections: Not on file  Intimate Partner Violence: Not At Risk (09/19/2023)   Humiliation, Afraid, Rape,  and Kick questionnaire    Fear of Current or Ex-Partner: No    Emotionally Abused: No    Physically Abused: No    Sexually Abused: No      Family History  Problem Relation Age of Onset   Cancer Mother        uterine cancer      PHYSICAL EXAM:  General: Thin appearing. No resp difficulty HEENT: normal Neck: supple, no JVD Cor: Regular rhythm, rate. No rubs, gallops or murmurs Lungs: clear Abdomen: soft, nontender, nondistended. Extremities: no cyanosis, clubbing, rash, edema Neuro: alert & oriented X 3. Moves all 4 extremities w/o difficulty. Affect pleasant   ECG: not done   ASSESSMENT & PLAN:  1: NICM with mildly reduced ejection fraction- -  likely due to HTN/ CLL - NYHA class II-III - mildly fluid up with elevated BNP and worsening SOB/ fatigue - weighing daily; reminded to call for an overnight weight gain of > 2 pounds or a weekly weight gain of > 5 pounds - weight stable from last visit here 2 months ago - Echo 05/30/22: EF of 45-50% along with mild/moderate MR - Echo 06/19/23: EF 40-45% along with mild LVH, Grade II DD, mildly elevated PA pressure, mild/ moderate MR with moderate AS with Aortic valve area, by VTI measures 1.00 cm. Aortic valve mean gradient measures 13.0 mmHg.  - increase furosemide  to 80mg  every other day X 3 doses along with increasing potassium to 40meq every other day X 3 doses. Continue normal dose of furosemide  40mg / potassium 20meq in between increased doses - BMP/ BNP next week - has had hypotension in the past so may not be able to tolerate ARB/ ARNi - has had elevated K+ in the past so unable to use spironolactone - history of recurrent UTI so will not be a candidate for SGLT2 - saw cardiology ([facility]) 06/25 - not candidate for advanced therapies due to progressing CLL - BNP 08/04/24 was 1446.7  2: HTN- - BP 146/51 - saw PCP Loree) 03/25 - BMP 07/21/24 reviewed: sodium 137, potassium 3.9, creatinine 1.69 and GFR 29 - BMET next  week  3: CLL- - saw oncology Oza) 08/25 along w/ palliative care ([facility]) - WBC 08/04/24 was 83.5 - disease progressing; continues to decline treatment  4: 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 Received blood transfusion before today's appointment along with IV lasix .    Return in 2 months, sooner if needed.    Ellouise DELENA Class, FNP 08/05/24

## 2024-08-06 ENCOUNTER — Encounter: Payer: Self-pay | Admitting: Internal Medicine

## 2024-08-06 LAB — BPAM RBC
Blood Product Expiration Date: 202509152359
ISSUE DATE / TIME: 202508191329
Unit Type and Rh: 5100

## 2024-08-06 LAB — TYPE AND SCREEN
ABO/RH(D): O POS
Antibody Screen: POSITIVE
DAT, IgG: POSITIVE
DAT, complement: NEGATIVE
Donor AG Type: NEGATIVE
Unit division: 0

## 2024-08-19 ENCOUNTER — Encounter

## 2024-08-19 ENCOUNTER — Inpatient Hospital Stay: Admitting: Nurse Practitioner

## 2024-08-19 ENCOUNTER — Telehealth: Payer: Self-pay | Admitting: *Deleted

## 2024-08-19 ENCOUNTER — Inpatient Hospital Stay: Attending: Nurse Practitioner

## 2024-08-19 VITALS — BP 137/45 | HR 70 | Temp 97.8°F | Resp 16 | Wt 90.5 lb

## 2024-08-19 DIAGNOSIS — D649 Anemia, unspecified: Secondary | ICD-10-CM

## 2024-08-19 DIAGNOSIS — C9110 Chronic lymphocytic leukemia of B-cell type not having achieved remission: Secondary | ICD-10-CM | POA: Diagnosis present

## 2024-08-19 DIAGNOSIS — N200 Calculus of kidney: Secondary | ICD-10-CM | POA: Insufficient documentation

## 2024-08-19 DIAGNOSIS — I130 Hypertensive heart and chronic kidney disease with heart failure and stage 1 through stage 4 chronic kidney disease, or unspecified chronic kidney disease: Secondary | ICD-10-CM | POA: Insufficient documentation

## 2024-08-19 DIAGNOSIS — D630 Anemia in neoplastic disease: Secondary | ICD-10-CM | POA: Insufficient documentation

## 2024-08-19 DIAGNOSIS — M159 Polyosteoarthritis, unspecified: Secondary | ICD-10-CM | POA: Insufficient documentation

## 2024-08-19 DIAGNOSIS — Z79899 Other long term (current) drug therapy: Secondary | ICD-10-CM | POA: Insufficient documentation

## 2024-08-19 DIAGNOSIS — N183 Chronic kidney disease, stage 3 unspecified: Secondary | ICD-10-CM | POA: Insufficient documentation

## 2024-08-19 DIAGNOSIS — G5600 Carpal tunnel syndrome, unspecified upper limb: Secondary | ICD-10-CM | POA: Insufficient documentation

## 2024-08-19 LAB — CBC WITH DIFFERENTIAL (CANCER CENTER ONLY)
Abs Immature Granulocytes: 0.28 K/uL — ABNORMAL HIGH (ref 0.00–0.07)
Basophils Absolute: 0.1 K/uL (ref 0.0–0.1)
Basophils Relative: 0 %
Eosinophils Absolute: 1.2 K/uL — ABNORMAL HIGH (ref 0.0–0.5)
Eosinophils Relative: 1 %
HCT: 30.5 % — ABNORMAL LOW (ref 36.0–46.0)
Hemoglobin: 9.2 g/dL — ABNORMAL LOW (ref 12.0–15.0)
Immature Granulocytes: 0 %
Lymphocytes Relative: 93 %
Lymphs Abs: 110.7 K/uL — ABNORMAL HIGH (ref 0.7–4.0)
MCH: 32.3 pg (ref 26.0–34.0)
MCHC: 30.2 g/dL (ref 30.0–36.0)
MCV: 107 fL — ABNORMAL HIGH (ref 80.0–100.0)
Monocytes Absolute: 2.3 K/uL — ABNORMAL HIGH (ref 0.1–1.0)
Monocytes Relative: 2 %
Neutro Abs: 5.1 K/uL (ref 1.7–7.7)
Neutrophils Relative %: 4 %
Platelet Count: 124 K/uL — ABNORMAL LOW (ref 150–400)
RBC: 2.85 MIL/uL — ABNORMAL LOW (ref 3.87–5.11)
RDW: 22.6 % — ABNORMAL HIGH (ref 11.5–15.5)
Smear Review: NORMAL
WBC Count: 119.6 K/uL (ref 4.0–10.5)
nRBC: 0 % (ref 0.0–0.2)

## 2024-08-19 LAB — BASIC METABOLIC PANEL - CANCER CENTER ONLY
Anion gap: 6 (ref 5–15)
BUN: 52 mg/dL — ABNORMAL HIGH (ref 8–23)
CO2: 19 mmol/L — ABNORMAL LOW (ref 22–32)
Calcium: 8.5 mg/dL — ABNORMAL LOW (ref 8.9–10.3)
Chloride: 111 mmol/L (ref 98–111)
Creatinine: 1.69 mg/dL — ABNORMAL HIGH (ref 0.44–1.00)
GFR, Estimated: 29 mL/min — ABNORMAL LOW (ref >60)
Glucose, Bld: 114 mg/dL — ABNORMAL HIGH (ref 70–99)
Potassium: 5.2 mmol/L — ABNORMAL HIGH (ref 3.5–5.1)
Sodium: 136 mmol/L (ref 135–145)

## 2024-08-19 LAB — SAMPLE TO BLOOD BANK

## 2024-08-19 LAB — LACTATE DEHYDROGENASE: LDH: 157 U/L (ref 98–192)

## 2024-08-19 MED ORDER — AMOXICILLIN 500 MG PO CAPS: 500.0000 mg | ORAL_CAPSULE | Freq: Three times a day (TID) | ORAL | 0 refills | Status: AC

## 2024-08-19 NOTE — Progress Notes (Signed)
  Cancer Center CONSULT NOTE  Patient Care Team: Valora Agent, MD as PCP - General (Family Medicine) Rennie Cindy SAUNDERS, MD as Consulting Physician (Oncology)  CHIEF COMPLAINTS/PURPOSE OF CONSULTATION: CLL  #  Oncology History Overview Note  # LYMPHOCYTOSIS [25,K]/ANEMIA 9-10]; platelets-N; IgVH Somatic Hypermutation was not detected.56% OF NUCLEI POSITIVE FOR A 13Q DELETION; The phenotype is most consistent with a diagnosis of 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 Low level hypermetabolism consistent with CLL.2. Splenomegaly but no focal splenic lesions.  # April 2022- 13 Q DEL; IGVH- UNMUTATED  # MID April 2022- IBRUTINIB  280 mg x3 days [STOPPED sec ER/hypertensive emergency- 191/129]  # MAY 26th, 2022- start acalabrutinib  100 mg once a day [reduced dose]; intermittent; discontinued  September mid 2022. [Muscle cramps.]  # MAY 1st week- 2024- Zanubrutinib  80 mg; stopped after 1-2 weeks- sec to acute renal failure hyperkalemia/extreme fatigue etc.  Patient declined further therapies.       CLL (chronic lymphocytic leukemia) (HCC)  02/14/2021 Initial Diagnosis   CLL (chronic lymphocytic leukemia) (HCC)   04/07/2021 Cancer Staging   Staging form: Chronic Lymphocytic Leukemia / Small Lymphocytic Lymphoma, AJCC 8th Edition - Clinical: Modified Rai Stage III (Modified Rai risk: High, Binet: Stage C) - Signed by Rennie Cindy SAUNDERS, MD on 04/07/2021 Stage prefix: Initial diagnosis   12/16/2021 - 12/16/2021 Chemotherapy   Patient is on Treatment Plan : CLL - Rituximab q 4 weeks      HISTORY OF PRESENTING ILLNESS: [name] Caucasian female patient/accompanied by friend, Nathanel.  Ambulating independently.  Tonye FORBES Pepper 89 y.o. female symptomatic CLL, currently on on surveillance because of intolerance to  ibrutinib /acalabrutinib /zanubrutinib  & congestive heart failure, is here for follow-up. She continues with supportive care and transfusions. Continued compliance with lasix  and potassium. Appetite is stable. No fevers or chills but does have increased cough and congestion recently.   Review of Systems  Constitutional:  Positive for malaise/fatigue and weight loss. Negative for chills, diaphoresis and fever.  HENT:  Negative for nosebleeds and sore throat.   Eyes:  Negative for double vision.  Respiratory:  Negative for cough, hemoptysis, sputum production, shortness of breath and wheezing.   Cardiovascular:  Negative for chest pain, palpitations, orthopnea and leg swelling.  Gastrointestinal:  Negative for abdominal pain, constipation, diarrhea, heartburn, melena, nausea and vomiting.  Genitourinary:  Negative for dysuria, frequency and urgency.  Musculoskeletal:  Positive for joint pain. Negative for back pain.  Skin: Negative.  Negative for itching and rash.  Neurological:  Negative for dizziness, tingling, focal weakness, weakness and headaches.  Endo/Heme/Allergies:  Bruises/bleeds easily.  Psychiatric/Behavioral:  Negative for depression. The patient is not nervous/anxious and does not have insomnia.      MEDICAL HISTORY:  Past Medical History:  Diagnosis Date   Arthritis    RHEUMATOID   CHF (congestive heart failure) (HCC)    CLL (chronic lymphocytic leukemia) (HCC)    Edema    MILD ANKLE OCCAS   Fibromyalgia    GERD (gastroesophageal reflux disease)    Hypertension    Kidney stones    RLS (restless legs syndrome)     SURGICAL HISTORY: Past Surgical History:  Procedure Laterality Date   ABDOMINAL HYSTERECTOMY     CATARACT EXTRACTION W/PHACO Left 02/07/2016   Procedure: CATARACT EXTRACTION PHACO AND INTRAOCULAR LENS PLACEMENT (IOC);  Surgeon: Steven Dingeldein, MD;  Location: [facility] ORS;  Service: Ophthalmology;  Laterality: Left;  US  01:33 AP% 24.7 CDE 39.57 fluid pack lot  # [id number]   EXTRACORPOREAL SHOCK WAVE LITHOTRIPSY Right 07/03/2019   Procedure: EXTRACORPOREAL SHOCK WAVE LITHOTRIPSY (ESWL);  Surgeon: Kassie Ozell SAUNDERS, MD;  Location: [facility] ORS;  Service: Urology;  Laterality: Right;   EYE SURGERY     HIP ARTHROPLASTY Left 04/20/2020   Procedure: ARTHROPLASTY BIPOLAR HIP (HEMIARTHROPLASTY);  Surgeon: Kathlynn Ozell, MD;  Location: [facility] ORS;  Service: Orthopedics;  Laterality: Left;   LITHOTRIPSY     TONSILLECTOMY      SOCIAL HISTORY: Social History   Socioeconomic History   Marital status: Widowed    Spouse name: Not on file   Number of children: Not on file   Years of education: Not on file   Highest education level: Not on file  Occupational History   Not on file  Tobacco Use   Smoking status: Never   Smokeless tobacco: Never  Substance and Sexual Activity   Alcohol use: No   Drug use: Never   Sexual activity: Not Currently  Other Topics Concern   Not on file  Social History Narrative   Lives with daughter, in [place]. 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    Social Drivers of Corporate investment banker Strain: Low Risk  (12/24/2023)   Received from [facility]   Overall Financial Resource Strain (CARDIA)    Difficulty of Paying Living Expenses: Not hard at all  Food Insecurity: No Food Insecurity (12/24/2023)   Received from [facility]   Hunger Vital Sign    Within the past 12 months, the food you bought just didn't last and you didn't have money to get more.: Never true    Within the past 12 months, you worried that your food would run out before you got the money to buy more.: Never true  Transportation Needs: No Transportation Needs (12/24/2023)   Received from [facility]   PRAPARE - Transportation    Lack of Transportation (Non-Medical): No    In the past 12 months, has lack of transportation kept you from medical appointments or from getting medications?: No   Physical Activity: Not on file  Stress: Not on file  Social Connections: Not on file  Intimate Partner Violence: Not At Risk (09/19/2023)   Humiliation, Afraid, Rape, and Kick questionnaire    Fear of Current or Ex-Partner: No    Emotionally Abused: No    Physically Abused: No    Sexually Abused: No    FAMILY HISTORY: Family History  Problem Relation Age of Onset   Cancer Mother        uterine cancer    ALLERGIES:  is allergic to gabapentin and demerol [meperidine].  MEDICATIONS:  Current Outpatient Medications  Medication Sig Dispense Refill   amitriptyline  (ELAVIL ) 10 MG tablet Take 20 mg by mouth at bedtime.     cyanocobalamin  (,VITAMIN B-12,) 1000 MCG/ML injection Inject into the muscle every 14 (fourteen) days. Next injection 4/16     cyanocobalamin  (VITAMIN B12) 1000 MCG tablet Take 1,000 mcg by mouth daily.     DULoxetine  (CYMBALTA ) 20 MG capsule Take 20 mg by mouth daily.     Ferrous Sulfate 28 MG TABS Take 28 mg by mouth daily.     furosemide  (LASIX ) 20 MG tablet TAKE TWO TABLETS (40 MG TOTAL) BY MOUTH DAILY. 60 tablet 5   MELATONIN GUMMIES PO Take 3 mg by mouth at bedtime as needed.     ondansetron  (  ZOFRAN ) 8 MG tablet TAKE ONE TABLET (8 MG TOTAL) BY MOUTH EVERY EIGHT HOURS AS NEEDED FOR NAUSEA OR VOMITING. 20 tablet 0   oxyCODONE  (OXY IR/ROXICODONE ) 5 MG immediate release tablet Take 1-2 tablets (5-10 mg total) by mouth every 4 (four) hours as needed for severe pain (pain score 7-10) (cancer associated pain G89.3). 90 tablet 0   oxyCODONE -acetaminophen  (PERCOCET/ROXICET) 5-325 MG tablet Take 1-2 tablets by mouth every 4 (four) hours as needed for severe pain (pain score 7-10). 90 tablet 0   polyethylene glycol powder (GLYCOLAX /MIRALAX ) 17 GM/SCOOP powder Take 17 g by mouth 2 (two) times daily as needed for moderate constipation. 255 g 0   potassium chloride  SA (KLOR-CON  M) 20 MEQ tablet Take 1 tablet (20 mEq total) by mouth as directed. By the heart failure clinic  (Patient taking differently: Take 20 mEq by mouth daily. By the heart failure clinic) 30 tablet 0   No current facility-administered medications for this visit.    PHYSICAL EXAMINATION: ECOG PERFORMANCE STATUS: 0 - Asymptomatic  Vitals:   08/19/24 1140  BP: (!) 137/45  Pulse: 70  Resp: 16  Temp: 97.8 F (36.6 C)  SpO2: 98%   Filed Weights   08/19/24 1140  Weight: 90 lb 8 oz (41.1 kg)   Physical Exam Vitals reviewed.  Constitutional:      Appearance: She is not ill-appearing.  HENT:     Head: Normocephalic and atraumatic.  Cardiovascular:     Rate and Rhythm: Normal rate and regular rhythm.  Pulmonary:     Effort: Pulmonary effort is normal. No respiratory distress.     Breath sounds: Normal breath sounds. No wheezing.  Abdominal:     General: There is no distension.     Palpations: Abdomen is soft.     Tenderness: There is no abdominal tenderness. There is no guarding or rebound.  Musculoskeletal:        General: No tenderness.  Lymphadenopathy:     Comments: Bulky bilateral neck adenopathy; underarm adenopathy.  Skin:    General: Skin is warm.     Coloration: Skin is not pale.     Comments: Chronic multiple bruises noted.  Neurological:     Mental Status: She is alert and oriented to person, place, and time.  Psychiatric:        Mood and Affect: Mood and affect normal.        Behavior: Behavior normal.    LABORATORY DATA:  I have reviewed the data as listed Lab Results  Component Value Date   WBC 119.6 (HH) 08/19/2024   HGB 9.2 (L) 08/19/2024   HCT 30.5 (L) 08/19/2024   MCV 107.0 (H) 08/19/2024   PLT 124 (L) 08/19/2024   Recent Labs    04/13/24 1123 04/28/24 1446 06/09/24 1006 06/11/24 2234 06/16/24 1009 06/23/24 1247 07/21/24 1242 08/19/24 1048  NA 137   < > 136 139   < > 137 137 136  K 4.0   < > 4.5 4.5   < > 4.3 3.9 5.2*  CL 111   < > 105 106   < > 108 110 111  CO2 20*   < > 20* 22   < > 21* 20* 19*  GLUCOSE 172*   < > 165* 105*   < >  182* 135* 114*  BUN 51*   < > 72* 74*   < > 75* 65* 52*  CREATININE 1.54*   < > 2.00* 1.91*   < > 1.79* 1.69*  1.69*  CALCIUM  8.4*   < > 8.2* 8.3*   < > 8.6* 8.4* 8.5*  GFRNONAA 32*   < > 24* 25*   < > 27* 29* 29*  PROT 6.4*  --  5.9* 6.4*  --   --   --   --   ALBUMIN 4.0  --  3.9 4.1  --   --   --   --   AST 19  --  16 19  --   --   --   --   ALT 8  --  9 10  --   --   --   --   ALKPHOS 107  --  108 109  --   --   --   --   BILITOT 1.0  --  0.9 0.9  --   --   --   --    < > = values in this interval not displayed.    RADIOGRAPHIC STUDIES: I have personally reviewed the radiological images as listed and agreed with the findings in the report. No results found.    ASSESSMENT & PLAN:   CLL (chronic lymphocytic leukemia) (HCC) # CLL: [56% OF NUCLEI POSITIVE FOR A 13Q DELETION]; April 2022-PET scan shows significant lymphadenopathy above and below diaphragm; splenomegaly. Discontinued Ibrutinib  [hypertensive emergencies] acalabrutinib  [leg cramps even with minimum dose].Discontinued zanubrutinib . hypokalemia loss and renal failure/extreme fatigue etc.     # Progression of disease given the rising white count. Again declined any treatment options.    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  #OCT 2024- I sat- 11%- on  gentle iron [iron biglycinate; 28 mg ] 1 pill a day. HOLD retacrit  today-given patient's preference/lack of improvement.   # Today Hb 9.2. Hold blood tomorrow.     # Cardiac - history of CHF / elevated blood pressure- will add lasix  with PRBC infusion- continue monitoring at home. Stable   # CKD stage III-IV- GFR 30-   Underlying diuretic/CHF/possible tumor lysis- stable.    #  s/p pallaitive care-as per patient wishes continue follow-up hospice.  Continue supportive care.  Discussed with Dr. Sherleen.   # ACP- DNR    # DISPOSITION: # cancel blood tomorrow # in 2 weeks- labs- (cbc/HOLD tube) # D-2 possible 1 unit PRBC blood; lasix  20 mg  IV post transfusion # 4 weeks- labs (cbc/bmp/LDH/HOLD tube), Dr Rennie,  # D-2- possible 1 unit PRBC-lasix  20 mg IV post transfusion - la  No problem-specific Assessment & Plan notes found for this encounter.  All questions were answered. The patient knows to call the clinic with any problems, questions or concerns.    Tinnie KANDICE Dawn, NP 08/19/2024

## 2024-08-19 NOTE — Progress Notes (Signed)
 Pt in for followup, short of breath on minimal exertion.

## 2024-08-19 NOTE — Telephone Encounter (Signed)
 1129 am- called and spoke with Luke R in cancer center lab. Critical value reported- Wbc 119.6 critical value. Read back process performed. Lauren, NP informed of critical value-1139 am read back process performed with provider.

## 2024-08-20 ENCOUNTER — Inpatient Hospital Stay

## 2024-08-28 ENCOUNTER — Other Ambulatory Visit: Payer: Self-pay | Admitting: Family Medicine

## 2024-09-01 ENCOUNTER — Telehealth: Payer: Self-pay

## 2024-09-01 NOTE — Telephone Encounter (Signed)
 Callers name: Tracie Barber  Relation to patient:   Call back phone #: [phone]  Pharmacy (if applicable):   Issue/reason for call: daughter called about pt's throat congestion. States that the oncologist prescribed amoxicillin  with no change. The pharmacist suggested zyrtec with no change. Daughter was wondering after a google search, if the potassium tablets were causing throat congestion. Per Ellouise Class, FNP potassium being the cause is very unlikely. Told to follow up with PCP. No further questions at this time.

## 2024-09-02 ENCOUNTER — Inpatient Hospital Stay

## 2024-09-02 ENCOUNTER — Encounter: Payer: Self-pay | Admitting: *Deleted

## 2024-09-02 ENCOUNTER — Other Ambulatory Visit: Payer: Self-pay | Admitting: *Deleted

## 2024-09-02 ENCOUNTER — Other Ambulatory Visit: Payer: Self-pay | Admitting: Internal Medicine

## 2024-09-02 ENCOUNTER — Telehealth: Payer: Self-pay | Admitting: *Deleted

## 2024-09-02 DIAGNOSIS — C9110 Chronic lymphocytic leukemia of B-cell type not having achieved remission: Secondary | ICD-10-CM

## 2024-09-02 DIAGNOSIS — D649 Anemia, unspecified: Secondary | ICD-10-CM

## 2024-09-02 LAB — CBC WITH DIFFERENTIAL (CANCER CENTER ONLY)
Abs Immature Granulocytes: 0.49 K/uL — ABNORMAL HIGH (ref 0.00–0.07)
Basophils Absolute: 0.1 K/uL (ref 0.0–0.1)
Basophils Relative: 0 %
Eosinophils Absolute: 1.8 K/uL — ABNORMAL HIGH (ref 0.0–0.5)
Eosinophils Relative: 1 %
HCT: 26.6 % — ABNORMAL LOW (ref 36.0–46.0)
Hemoglobin: 7.7 g/dL — ABNORMAL LOW (ref 12.0–15.0)
Immature Granulocytes: 0 %
Lymphocytes Relative: 94 %
Lymphs Abs: 120.7 K/uL — ABNORMAL HIGH (ref 0.7–4.0)
MCH: 31.8 pg (ref 26.0–34.0)
MCHC: 28.9 g/dL — ABNORMAL LOW (ref 30.0–36.0)
MCV: 109.9 fL — ABNORMAL HIGH (ref 80.0–100.0)
Monocytes Absolute: 3 K/uL — ABNORMAL HIGH (ref 0.1–1.0)
Monocytes Relative: 2 %
Neutro Abs: 4.5 K/uL (ref 1.7–7.7)
Neutrophils Relative %: 3 %
Platelet Count: 136 K/uL — ABNORMAL LOW (ref 150–400)
RBC: 2.42 MIL/uL — ABNORMAL LOW (ref 3.87–5.11)
RDW: 21.2 % — ABNORMAL HIGH (ref 11.5–15.5)
WBC Count: 130.7 K/uL (ref 4.0–10.5)
nRBC: 0 % (ref 0.0–0.2)

## 2024-09-02 NOTE — Telephone Encounter (Signed)
 Critical lab called by [facility] in the lab; WBC 130.7 Readback. Dr Rennie notified.

## 2024-09-03 ENCOUNTER — Inpatient Hospital Stay

## 2024-09-03 ENCOUNTER — Other Ambulatory Visit: Payer: Self-pay | Admitting: *Deleted

## 2024-09-03 DIAGNOSIS — D649 Anemia, unspecified: Secondary | ICD-10-CM

## 2024-09-03 DIAGNOSIS — C9110 Chronic lymphocytic leukemia of B-cell type not having achieved remission: Secondary | ICD-10-CM

## 2024-09-03 LAB — PREPARE RBC (CROSSMATCH)

## 2024-09-03 MED ORDER — SODIUM CHLORIDE 0.9% IV SOLUTION
250.0000 mL | INTRAVENOUS | Status: DC
  Administered 2024-09-03: 100 mL via INTRAVENOUS
  Filled 2024-09-03: qty 250

## 2024-09-03 MED ORDER — ACETAMINOPHEN 325 MG PO TABS
650.0000 mg | ORAL_TABLET | Freq: Once | ORAL | Status: AC
  Administered 2024-09-03: 650 mg via ORAL
  Filled 2024-09-03: qty 2

## 2024-09-03 MED ORDER — FUROSEMIDE 10 MG/ML IJ SOLN
20.0000 mg | Freq: Once | INTRAMUSCULAR | Status: AC
  Administered 2024-09-03: 20 mg via INTRAVENOUS
  Filled 2024-09-03: qty 2

## 2024-09-03 MED ORDER — DIPHENHYDRAMINE HCL 25 MG PO CAPS
25.0000 mg | ORAL_CAPSULE | Freq: Once | ORAL | Status: AC
  Administered 2024-09-03: 25 mg via ORAL
  Filled 2024-09-03: qty 1

## 2024-09-04 LAB — BPAM RBC
Blood Product Expiration Date: 202510092359
ISSUE DATE / TIME: 202509170908
Unit Type and Rh: 5100

## 2024-09-04 LAB — TYPE AND SCREEN
ABO/RH(D): O POS
Antibody Screen: POSITIVE
DAT, IgG: POSITIVE
DAT, complement: NEGATIVE
Donor AG Type: NEGATIVE
Unit division: 0

## 2024-09-05 LAB — PREPARE RBC (CROSSMATCH)

## 2024-09-16 ENCOUNTER — Inpatient Hospital Stay

## 2024-09-16 ENCOUNTER — Inpatient Hospital Stay: Admitting: Internal Medicine

## 2024-09-16 ENCOUNTER — Encounter: Payer: Self-pay | Admitting: Internal Medicine

## 2024-09-16 VITALS — BP 135/49 | HR 70 | Temp 98.6°F | Resp 17 | Ht 63.0 in | Wt 89.0 lb

## 2024-09-16 DIAGNOSIS — C9110 Chronic lymphocytic leukemia of B-cell type not having achieved remission: Secondary | ICD-10-CM

## 2024-09-16 DIAGNOSIS — D649 Anemia, unspecified: Secondary | ICD-10-CM

## 2024-09-16 LAB — BASIC METABOLIC PANEL - CANCER CENTER ONLY
Anion gap: 7 (ref 5–15)
BUN: 60 mg/dL — ABNORMAL HIGH (ref 8–23)
CO2: 20 mmol/L — ABNORMAL LOW (ref 22–32)
Calcium: 8.4 mg/dL — ABNORMAL LOW (ref 8.9–10.3)
Chloride: 111 mmol/L (ref 98–111)
Creatinine: 1.93 mg/dL — ABNORMAL HIGH (ref 0.44–1.00)
GFR, Estimated: 25 mL/min — ABNORMAL LOW (ref >60)
Glucose, Bld: 191 mg/dL — ABNORMAL HIGH (ref 70–99)
Potassium: 4.8 mmol/L (ref 3.5–5.1)
Sodium: 138 mmol/L (ref 135–145)

## 2024-09-16 LAB — CBC WITH DIFFERENTIAL (CANCER CENTER ONLY)
Abs Immature Granulocytes: 0.6 K/uL — ABNORMAL HIGH (ref 0.00–0.07)
Basophils Absolute: 0.1 K/uL (ref 0.0–0.1)
Basophils Relative: 0 %
Eosinophils Absolute: 2.6 K/uL — ABNORMAL HIGH (ref 0.0–0.5)
Eosinophils Relative: 2 %
HCT: 30 % — ABNORMAL LOW (ref 36.0–46.0)
Hemoglobin: 8.9 g/dL — ABNORMAL LOW (ref 12.0–15.0)
Immature Granulocytes: 1 %
Lymphocytes Relative: 91 %
Lymphs Abs: 107.9 K/uL — ABNORMAL HIGH (ref 0.7–4.0)
MCH: 31.9 pg (ref 26.0–34.0)
MCHC: 29.7 g/dL — ABNORMAL LOW (ref 30.0–36.0)
MCV: 107.5 fL — ABNORMAL HIGH (ref 80.0–100.0)
Monocytes Absolute: 2.7 K/uL — ABNORMAL HIGH (ref 0.1–1.0)
Monocytes Relative: 2 %
Neutro Abs: 4.7 K/uL (ref 1.7–7.7)
Neutrophils Relative %: 4 %
Platelet Count: 170 K/uL (ref 150–400)
RBC: 2.79 MIL/uL — ABNORMAL LOW (ref 3.87–5.11)
RDW: 19.8 % — ABNORMAL HIGH (ref 11.5–15.5)
Smear Review: NORMAL
WBC Count: 118.4 K/uL (ref 4.0–10.5)
nRBC: 0 % (ref 0.0–0.2)

## 2024-09-16 LAB — LACTATE DEHYDROGENASE: LDH: 134 U/L (ref 98–192)

## 2024-09-16 LAB — SAMPLE TO BLOOD BANK

## 2024-09-16 MED ORDER — PREDNISONE 20 MG PO TABS: 20.0000 mg | ORAL_TABLET | Freq: Every day | ORAL | 1 refills | Status: DC

## 2024-09-16 NOTE — Progress Notes (Signed)
 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 Low level hypermetabolism consistent with CLL.2. Splenomegaly but no focal splenic lesions.  # April 2022- 13 ELINORE KIDD; OSKER- UNMUTATED  # MID April 2022- IBRUTINIB  280 mg x3 days [STOPPED sec ER/hypertensive emergency- 191/129]  # MAY 26th, 2022- start acalabrutinib  100 mg once a day [reduced dose]; intermittent; discontinued  September mid 2022. [Muscle cramps.]  # MAY 1st week- 2024- Zanubrutinib  80 mg; stopped after 1-2 weeks- sec to acute renal failure hyperkalemia/extreme fatigue etc.  Patient declined further therapies.       CLL (chronic lymphocytic leukemia) (HCC)  02/14/2021 Initial Diagnosis   CLL (chronic lymphocytic leukemia) (HCC)   04/07/2021 Cancer Staging   Staging form: Chronic Lymphocytic Leukemia / Small Lymphocytic Lymphoma, AJCC 8th Edition - Clinical: Modified Rai Stage III (Modified Rai risk: High, Binet: Stage C) - Signed by Rennie Cindy SAUNDERS, MD on 04/07/2021 Stage prefix: Initial diagnosis   12/16/2021 - 12/16/2021 Chemotherapy   Patient is on Treatment Plan : CLL - Rituximab q 4 weeks      HISTORY OF PRESENTING ILLNESS: Frail-appearing Caucasian female patient/accompanied by daughter.   Ambulating independently.  Lindsay [name] 89 y.o. female symptomatic CLL, currently on on surveillance because of intolerance to  ibrutinib /acalabrutinib /zanubrutinib  & congestive heart failure, is here for follow-up.    She continues with supportive care and transfusions. Continued compliance with lasix . However stopped sec to intolerance to potassium. Appetite is stable. No fevers or chills.   Complains of ongoing fatigue.   Review of Systems  Constitutional:  Positive for malaise/fatigue and weight loss. Negative for chills, diaphoresis and fever.  HENT:  Negative for nosebleeds and sore throat.   Eyes:  Negative for double vision.  Respiratory:  Negative for cough, hemoptysis, sputum production, shortness of breath and wheezing.   Cardiovascular:  Negative for chest pain, palpitations, orthopnea and leg swelling.  Gastrointestinal:  Negative for abdominal pain, constipation, diarrhea, heartburn, melena, nausea and vomiting.  Genitourinary:  Negative for dysuria, frequency and urgency.  Musculoskeletal:  Positive for joint pain. Negative for back pain.  Skin: Negative.  Negative for itching and rash.  Neurological:  Negative for dizziness, tingling, focal weakness, weakness and headaches.  Endo/Heme/Allergies:  Bruises/bleeds easily.  Psychiatric/Behavioral:  Negative for depression. The patient is not nervous/anxious and does not have insomnia.      MEDICAL HISTORY:  Past Medical History:  Diagnosis Date   Arthritis    RHEUMATOID   CHF (congestive heart failure) (HCC)    CLL (chronic lymphocytic leukemia) (HCC)    Edema    MILD ANKLE OCCAS   Fibromyalgia    GERD (gastroesophageal reflux disease)    Hypertension    Kidney stones    RLS (restless legs syndrome)     SURGICAL HISTORY: Past Surgical History:  Procedure Laterality Date   ABDOMINAL HYSTERECTOMY     CATARACT EXTRACTION W/PHACO Left 02/07/2016   Procedure: CATARACT EXTRACTION PHACO AND INTRAOCULAR LENS PLACEMENT (IOC);  Surgeon: Steven Dingeldein, MD;  Location: [facility]  ORS;  Service: Ophthalmology;  Laterality: Left;  US  01:33 AP% 24.7 CDE  39.57 fluid pack lot # [id number]   EXTRACORPOREAL SHOCK WAVE LITHOTRIPSY Right 07/03/2019   Procedure: EXTRACORPOREAL SHOCK WAVE LITHOTRIPSY (ESWL);  Surgeon: Kassie Ozell SAUNDERS, MD;  Location: [facility] ORS;  Service: Urology;  Laterality: Right;   EYE SURGERY     HIP ARTHROPLASTY Left 04/20/2020   Procedure: ARTHROPLASTY BIPOLAR HIP (HEMIARTHROPLASTY);  Surgeon: Kathlynn Ozell, MD;  Location: [facility] ORS;  Service: Orthopedics;  Laterality: Left;   LITHOTRIPSY     TONSILLECTOMY      SOCIAL HISTORY: Social History   Socioeconomic History   Marital status: Widowed    Spouse name: Not on file   Number of children: Not on file   Years of education: Not on file   Highest education level: Not on file  Occupational History   Not on file  Tobacco Use   Smoking status: Never   Smokeless tobacco: Never  Substance and Sexual Activity   Alcohol use: No   Drug use: Never   Sexual activity: Not Currently  Other Topics Concern   Not on file  Social History Narrative   Lives with daughter, in [place]. 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    Social Drivers of Corporate investment banker Strain: Low Risk  (12/24/2023)   Received from [facility]   Overall Financial Resource Strain (CARDIA)    Difficulty of Paying Living Expenses: Not hard at all  Food Insecurity: No Food Insecurity (12/24/2023)   Received from [facility]   Hunger Vital Sign    Within the past 12 months, the food you bought just didn't last and you didn't have money to get more.: Never true    Within the past 12 months, you worried that your food would run out before you got the money to buy more.: Never true  Transportation Needs: No Transportation Needs (12/24/2023)   Received from [facility]   PRAPARE - Transportation    Lack of Transportation (Non-Medical): No    In the past 12 months, has lack of transportation kept you from medical appointments or from getting  medications?: No  Physical Activity: Not on file  Stress: Not on file  Social Connections: Not on file  Intimate Partner Violence: Not At Risk (09/19/2023)   Humiliation, Afraid, Rape, and Kick questionnaire    Fear of Current or Ex-Partner: No    Emotionally Abused: No    Physically Abused: No    Sexually Abused: No    FAMILY HISTORY: Family History  Problem Relation Age of Onset   Cancer Mother        uterine cancer    ALLERGIES:  is allergic to gabapentin and demerol [meperidine].  MEDICATIONS:  Current Outpatient Medications  Medication Sig Dispense Refill   amitriptyline  (ELAVIL ) 10 MG tablet Take 20 mg by mouth at bedtime.     cetirizine (ZYRTEC) 5 MG tablet Take 5 mg by mouth.     cyanocobalamin  (,VITAMIN B-12,) 1000 MCG/ML injection Inject into the muscle every 14 (fourteen) days. Next injection 4/16     cyanocobalamin  (VITAMIN B12) 1000 MCG tablet Take 1,000 mcg by mouth daily.     DULoxetine  (CYMBALTA ) 20 MG capsule Take 20 mg by mouth daily.     Ferrous Sulfate 28 MG TABS Take 28 mg by mouth daily.     furosemide  (LASIX ) 20 MG tablet TAKE TWO TABLETS (40 MG TOTAL) BY MOUTH DAILY.  60 tablet 5   MELATONIN GUMMIES PO Take 3 mg by mouth at bedtime as needed.     ondansetron  (ZOFRAN ) 8 MG tablet TAKE ONE TABLET (8 MG TOTAL) BY MOUTH EVERY EIGHT HOURS AS NEEDED FOR NAUSEA OR VOMITING. 20 tablet 0   oxyCODONE -acetaminophen  (PERCOCET/ROXICET) 5-325 MG tablet Take 1-2 tablets by mouth every 4 (four) hours as needed for severe pain (pain score 7-10). 90 tablet 0   pantoprazole (PROTONIX) 40 MG tablet Take 40 mg by mouth daily.     polyethylene glycol powder (GLYCOLAX /MIRALAX ) 17 GM/SCOOP powder Take 17 g by mouth 2 (two) times daily as needed for moderate constipation. 255 g 0   potassium chloride  SA (KLOR-CON  M) 20 MEQ tablet TAKE ONE TABLET (20 MEQ TOTAL) BY MOUTH AS DIRECTED. BY THE HEART FAILURE CLINIC 30 tablet 0   predniSONE  (DELTASONE ) 20 MG tablet Take 1 tablet (20 mg  total) by mouth daily with breakfast. Once a day with food- in AM. 30 tablet 1   No current facility-administered medications for this visit.    PHYSICAL EXAMINATION: ECOG PERFORMANCE STATUS: 0 - Asymptomatic  Vitals:   09/16/24 0856  BP: (!) 135/49  Pulse: 70  Resp: 17  Temp: 98.6 F (37 C)  SpO2: 95%   Filed Weights   09/16/24 0856  Weight: 89 lb (40.4 kg)   Physical Exam Vitals reviewed.  Constitutional:      Appearance: She is not ill-appearing.  HENT:     Head: Normocephalic and atraumatic.  Cardiovascular:     Rate and Rhythm: Normal rate and regular rhythm.  Pulmonary:     Effort: Pulmonary effort is normal. No respiratory distress.     Breath sounds: Normal breath sounds. No wheezing.  Abdominal:     General: There is no distension.     Palpations: Abdomen is soft.     Tenderness: There is no abdominal tenderness. There is no guarding or rebound.  Musculoskeletal:        General: No tenderness.  Lymphadenopathy:     Comments: Bulky bilateral neck adenopathy; underarm adenopathy.  Skin:    General: Skin is warm.     Coloration: Skin is not pale.     Comments: Chronic multiple bruises noted.  Neurological:     Mental Status: She is alert and oriented to person, place, and time.  Psychiatric:        Mood and Affect: Mood and affect normal.        Behavior: Behavior normal.    LABORATORY DATA:  I have reviewed the data as listed Lab Results  Component Value Date   WBC 118.4 (HH) 09/16/2024   HGB 8.9 (L) 09/16/2024   HCT 30.0 (L) 09/16/2024   MCV 107.5 (H) 09/16/2024   PLT 170 09/16/2024   Recent Labs    04/13/24 1123 04/28/24 1446 06/09/24 1006 06/11/24 2234 06/16/24 1009 07/21/24 1242 08/19/24 1048 09/16/24 0858  NA 137   < > 136 139   < > 137 136 138  K 4.0   < > 4.5 4.5   < > 3.9 5.2* 4.8  CL 111   < > 105 106   < > 110 111 111  CO2 20*   < > 20* 22   < > 20* 19* 20*  GLUCOSE 172*   < > 165* 105*   < > 135* 114* 191*  BUN 51*   < >  72* 74*   < > 65* 52* 60*  CREATININE 1.54*   < >  2.00* 1.91*   < > 1.69* 1.69* 1.93*  CALCIUM  8.4*   < > 8.2* 8.3*   < > 8.4* 8.5* 8.4*  GFRNONAA 32*   < > 24* 25*   < > 29* 29* 25*  PROT 6.4*  --  5.9* 6.4*  --   --   --   --   ALBUMIN 4.0  --  3.9 4.1  --   --   --   --   AST 19  --  16 19  --   --   --   --   ALT 8  --  9 10  --   --   --   --   ALKPHOS 107  --  108 109  --   --   --   --   BILITOT 1.0  --  0.9 0.9  --   --   --   --    < > = values in this interval not displayed.    RADIOGRAPHIC STUDIES: I have personally reviewed the radiological images as listed and agreed with the findings in the report. No results found.    ASSESSMENT & PLAN:    CLL (chronic lymphocytic leukemia) (HCC) # CLL: [56% OF NUCLEI POSITIVE FOR A 13Q DELETION]; April 2022-PET scan shows significant lymphadenopathy above and below diaphragm; splenomegaly. Discontinued Ibrutinib  [hypertensive emergencies] acalabrutinib  [leg cramps even with minimum dose].Discontinued zanubrutinib . hypokalemia loss and renal failure/extreme fatigue etc.    # Progression of disease given the rising white count- progressively getting worse. But declines any therapies.  Recommend palliative prednisone  for appetites.   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  #OCT 2024- I sat- 11%- on  gentle iron [iron biglycinate; 28 mg ] 1 pill a day. HOLD retacrit  today-given patient's preference/lack of improvement. Today Hb- 8.9 - HOLD Blood tomorrow.   # Cardiac -history of CHF / elevated blood pressure- On lasix - with PRBC infusion- defr K to PCP.  # CKD stage III-IV  Underlying diuretic/CHF/possible tumor lysis- stable.   #  s/p pallaitive care-as per patient wishes continue follow-up hospice.  Continue supportive care.  Discussed with Dr. Sherleen.  # ACP- DNR   # DISPOSITION: # cancel blood tomorrow # in 2 weeks- labs- cbc/HOLD tube; D-2 possible 1 unit PRBC blood; lasix  20 mg IV post  transfusion # follow up in 4th week-  APP; labs- cbc/ bmp; LDH; HOLD tube; D-2- possible 1 unit PRBC-lasix  20 mg IV post transfusion- Dr.B   All questions were answered. The patient knows to call the clinic with any problems, questions or concerns.    Cindy JONELLE Joe, MD 09/16/2024

## 2024-09-16 NOTE — Assessment & Plan Note (Signed)
#   CLL: [56% OF NUCLEI POSITIVE FOR A 13Q DELETION]; April 2022-PET scan shows significant lymphadenopathy above and below diaphragm; splenomegaly. Discontinued Ibrutinib  [hypertensive emergencies] acalabrutinib  [leg cramps even with minimum dose].Discontinued zanubrutinib . hypokalemia loss and renal failure/extreme fatigue etc.    # Progression of disease given the rising white count- progressively getting worse. But declines any therapies.  Recommend palliative prednisone  for appetites.   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  #OCT 2024- I sat- 11%- on  gentle iron [iron biglycinate; 28 mg ] 1 pill a day. HOLD retacrit  today-given patient's preference/lack of improvement. Today Hb- 8.9 - HOLD Blood tomorrow.   # Cardiac -history of CHF / elevated blood pressure- On lasix - with PRBC infusion- defr K to PCP.  # CKD stage III-IV  Underlying diuretic/CHF/possible tumor lysis- stable.   #  s/p pallaitive care-as per patient wishes continue follow-up hospice.  Continue supportive care.  Discussed with Dr. Sherleen.  # ACP- DNR   # DISPOSITION: # cancel blood tomorrow # in 2 weeks- labs- cbc/HOLD tube; D-2 possible 1 unit PRBC blood; lasix  20 mg IV post transfusion # follow up in 4th week-  APP; labs- cbc/ bmp; LDH; HOLD tube; D-2- possible 1 unit PRBC-lasix  20 mg IV post transfusion- Dr.B

## 2024-09-16 NOTE — Progress Notes (Signed)
 Patient has no concerns.  Patient daughter states patient developed a cough while taking the potassium.

## 2024-09-16 NOTE — Assessment & Plan Note (Deleted)
#   CLL: [56% OF NUCLEI POSITIVE FOR A 13Q DELETION]; April 2022-PET scan shows significant lymphadenopathy above and below diaphragm; splenomegaly. Discontinued Ibrutinib  [hypertensive emergencies] acalabrutinib  [leg cramps even with minimum dose].Discontinued zanubrutinib . hypokalemia loss and renal failure/extreme fatigue etc.    # Progression of disease given the rising white count- progressively getting worse. But declines any therapies.  Recommend palliative prednisone  for appetites.   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  #OCT 2024- I sat- 11%- on  gentle iron [iron biglycinate; 28 mg ] 1 pill a day. HOLD retacrit  today-given patient's preference/lack of improvement. Today Hb- 8.9 - HOLD Blood tomorrow.   # Cardiac -history of CHF / elevated blood pressure- On lasix - with PRBC infusion- defr K to PCP.  # CKD stage III-IV  Underlying diuretic/CHF/possible tumor lysis- stable.   #  s/p pallaitive care-as per patient wishes continue follow-up hospice.  Continue supportive care.  Discussed with Dr. Sherleen.  # ACP- DNR   # DISPOSITION: # cancel blood tomorrow # in 2 weeks- labs- cbc/HOLD tube; D-2 possible 1 unit PRBC blood; lasix  20 mg IV post transfusion # follow up in 4th week-  APP; labs- cbc/ bmp; LDH; HOLD tube; D-2- possible 1 unit PRBC-lasix  20 mg IV post transfusion- Dr.B

## 2024-09-16 NOTE — Progress Notes (Signed)
 Critical lab called by [facility] in the lab; WBC 118.4 Read back. Dr B notified

## 2024-09-17 ENCOUNTER — Inpatient Hospital Stay: Attending: Nurse Practitioner

## 2024-09-22 ENCOUNTER — Inpatient Hospital Stay: Admitting: Hospice and Palliative Medicine

## 2024-09-29 ENCOUNTER — Other Ambulatory Visit

## 2024-09-29 ENCOUNTER — Telehealth: Payer: Self-pay | Admitting: *Deleted

## 2024-09-29 ENCOUNTER — Encounter: Payer: Self-pay | Admitting: Hospice and Palliative Medicine

## 2024-09-29 ENCOUNTER — Other Ambulatory Visit: Payer: Self-pay | Admitting: *Deleted

## 2024-09-29 DIAGNOSIS — R300 Dysuria: Secondary | ICD-10-CM

## 2024-09-29 NOTE — Telephone Encounter (Signed)
 I called Lindsay [name] let her know that the appointment is at 9:30 tomorrow f and then he will be seeing [name] look about the confusion that she has been having.  Lindsay [name] says that they will be there

## 2024-09-29 NOTE — Telephone Encounter (Signed)
 The daughter called today and said that her mom is having a lot of confusion and she wanted to talk to [facility] about this.she has CLL.

## 2024-09-30 ENCOUNTER — Ambulatory Visit
Admission: RE | Admit: 2024-09-30 | Discharge: 2024-09-30 | Disposition: A | Discharge status: Home / Self Care | Attending: Hospice and Palliative Medicine | Admitting: Hospice and Palliative Medicine

## 2024-09-30 ENCOUNTER — Inpatient Hospital Stay: Attending: Nurse Practitioner | Admitting: Hospice and Palliative Medicine

## 2024-09-30 ENCOUNTER — Inpatient Hospital Stay

## 2024-09-30 ENCOUNTER — Ambulatory Visit
Admission: RE | Admit: 2024-09-30 | Discharge: 2024-09-30 | Disposition: A | Discharge status: Home / Self Care | Source: Ambulatory Visit | Attending: Hospice and Palliative Medicine | Admitting: Hospice and Palliative Medicine

## 2024-09-30 ENCOUNTER — Encounter: Payer: Self-pay | Admitting: Hospice and Palliative Medicine

## 2024-09-30 ENCOUNTER — Encounter

## 2024-09-30 ENCOUNTER — Other Ambulatory Visit: Payer: Self-pay

## 2024-09-30 VITALS — BP 131/51 | HR 75 | Temp 97.6°F | Resp 18 | Ht 63.0 in | Wt 93.8 lb

## 2024-09-30 DIAGNOSIS — R0602 Shortness of breath: Secondary | ICD-10-CM | POA: Insufficient documentation

## 2024-09-30 DIAGNOSIS — R051 Acute cough: Secondary | ICD-10-CM

## 2024-09-30 DIAGNOSIS — R410 Disorientation, unspecified: Secondary | ICD-10-CM | POA: Insufficient documentation

## 2024-09-30 DIAGNOSIS — C9110 Chronic lymphocytic leukemia of B-cell type not having achieved remission: Secondary | ICD-10-CM

## 2024-09-30 DIAGNOSIS — Z79899 Other long term (current) drug therapy: Secondary | ICD-10-CM | POA: Diagnosis not present

## 2024-09-30 DIAGNOSIS — R300 Dysuria: Secondary | ICD-10-CM

## 2024-09-30 DIAGNOSIS — I110 Hypertensive heart disease with heart failure: Secondary | ICD-10-CM | POA: Insufficient documentation

## 2024-09-30 DIAGNOSIS — Z7952 Long term (current) use of systemic steroids: Secondary | ICD-10-CM | POA: Insufficient documentation

## 2024-09-30 DIAGNOSIS — D649 Anemia, unspecified: Secondary | ICD-10-CM | POA: Diagnosis not present

## 2024-09-30 DIAGNOSIS — I509 Heart failure, unspecified: Secondary | ICD-10-CM | POA: Diagnosis not present

## 2024-09-30 LAB — CBC WITH DIFFERENTIAL (CANCER CENTER ONLY)
Abs Immature Granulocytes: 0.87 K/uL — ABNORMAL HIGH (ref 0.00–0.07)
Basophils Absolute: 0.1 K/uL (ref 0.0–0.1)
Basophils Relative: 0 %
Eosinophils Absolute: 1.2 K/uL — ABNORMAL HIGH (ref 0.0–0.5)
Eosinophils Relative: 1 %
HCT: 31 % — ABNORMAL LOW (ref 36.0–46.0)
Hemoglobin: 8.7 g/dL — ABNORMAL LOW (ref 12.0–15.0)
Immature Granulocytes: 1 %
Lymphocytes Relative: 93 %
Lymphs Abs: 144.5 K/uL — ABNORMAL HIGH (ref 0.7–4.0)
MCH: 31.4 pg (ref 26.0–34.0)
MCHC: 28.1 g/dL — ABNORMAL LOW (ref 30.0–36.0)
MCV: 111.9 fL — ABNORMAL HIGH (ref 80.0–100.0)
Monocytes Absolute: 1.6 K/uL — ABNORMAL HIGH (ref 0.1–1.0)
Monocytes Relative: 1 %
Neutro Abs: 6.7 K/uL (ref 1.7–7.7)
Neutrophils Relative %: 4 %
Platelet Count: 145 K/uL — ABNORMAL LOW (ref 150–400)
RBC: 2.77 MIL/uL — ABNORMAL LOW (ref 3.87–5.11)
RDW: 18.1 % — ABNORMAL HIGH (ref 11.5–15.5)
Smear Review: NORMAL
WBC Count: 155 K/uL (ref 4.0–10.5)
nRBC: 0 % (ref 0.0–0.2)

## 2024-09-30 LAB — SAMPLE TO BLOOD BANK

## 2024-09-30 LAB — CMP (CANCER CENTER ONLY)
ALT: 28 U/L (ref 0–44)
AST: 19 U/L (ref 15–41)
Albumin: 4 g/dL (ref 3.5–5.0)
Alkaline Phosphatase: 82 U/L (ref 38–126)
Anion gap: 9 (ref 5–15)
BUN: 53 mg/dL — ABNORMAL HIGH (ref 8–23)
CO2: 23 mmol/L (ref 22–32)
Calcium: 8.2 mg/dL — ABNORMAL LOW (ref 8.9–10.3)
Chloride: 106 mmol/L (ref 98–111)
Creatinine: 1.66 mg/dL — ABNORMAL HIGH (ref 0.44–1.00)
GFR, Estimated: 29 mL/min — ABNORMAL LOW (ref >60)
Glucose, Bld: 123 mg/dL — ABNORMAL HIGH (ref 70–99)
Potassium: 4.6 mmol/L (ref 3.5–5.1)
Sodium: 138 mmol/L (ref 135–145)
Total Bilirubin: 0.8 mg/dL (ref 0.0–1.2)
Total Protein: 6.2 g/dL — ABNORMAL LOW (ref 6.5–8.1)

## 2024-09-30 LAB — URINALYSIS, COMPLETE (UACMP) WITH MICROSCOPIC
Bacteria, UA: NONE SEEN
Bilirubin Urine: NEGATIVE
Glucose, UA: NEGATIVE mg/dL
Hgb urine dipstick: NEGATIVE
Ketones, ur: NEGATIVE mg/dL
Leukocytes,Ua: NEGATIVE
Nitrite: NEGATIVE
Protein, ur: NEGATIVE mg/dL
Specific Gravity, Urine: 1.014 (ref 1.005–1.030)
pH: 5 (ref 5.0–8.0)

## 2024-09-30 MED ORDER — AMOXICILLIN-POT CLAVULANATE 500-125 MG PO TABS: 1.0000 | ORAL_TABLET | Freq: Two times a day (BID) | ORAL | 0 refills | Status: DC

## 2024-09-30 NOTE — Progress Notes (Addendum)
 Symptom Management Clinic [facility] at [facility] [phone] [phone]  Patient Care Team: Stanton Lynwood FALCON, MD as PCP - General (Family Medicine) Rennie Cindy SAUNDERS, MD as Consulting Physician (Oncology)   NAME OF PATIENT: Lindsay [name]  [id number]  11-28-1934   DATE OF VISIT: 09/30/24  REASON FOR CONSULT: Lindsay [name] is a 89 y.o. female with multiple medical problems including CLL with progression of disease/rising leukocytosis.  Patient was taken off treatment due to poor tolerance.  Hospice has been previously discussed but patient opted to continue transfusions.  INTERVAL HISTORY: Patient presents to [facility] today with complaint of confusion.  Daughter reports the patient has had about a week of intermittent confusion.  She has become angry at family and threatening to call the police.  Daughter denies any fever or chills.  Patient has had productive cough for several weeks.  No obvious shortness of breath.  No nausea, vomiting, diarrhea, or constipation.  No reported urinary symptoms.  Appetite has been poor/selective.  No headaches or visual changes reported.  Patient offers no further specific complaints today.   PAST MEDICAL HISTORY: Past Medical History:  Diagnosis Date   Arthritis    RHEUMATOID   CHF (congestive heart failure) (HCC)    CLL (chronic lymphocytic leukemia) (HCC)    Edema    MILD ANKLE OCCAS   Fibromyalgia    GERD (gastroesophageal reflux disease)    Hypertension    Kidney stones    RLS (restless legs syndrome)     PAST SURGICAL HISTORY:  Past Surgical History:  Procedure Laterality Date   ABDOMINAL HYSTERECTOMY     CATARACT EXTRACTION W/PHACO Left 02/07/2016   Procedure: CATARACT EXTRACTION PHACO AND INTRAOCULAR LENS PLACEMENT (IOC);  Surgeon: Steven Dingeldein, MD;  Location: [facility] ORS;  Service: Ophthalmology;  Laterality: Left;  US  01:33 AP% 24.7 CDE 39.57 fluid pack lot # [id number]    EXTRACORPOREAL SHOCK WAVE LITHOTRIPSY Right 07/03/2019   Procedure: EXTRACORPOREAL SHOCK WAVE LITHOTRIPSY (ESWL);  Surgeon: Kassie Ozell SAUNDERS, MD;  Location: [facility] ORS;  Service: Urology;  Laterality: Right;   EYE SURGERY     HIP ARTHROPLASTY Left 04/20/2020   Procedure: ARTHROPLASTY BIPOLAR HIP (HEMIARTHROPLASTY);  Surgeon: Kathlynn Ozell, MD;  Location: [facility] ORS;  Service: Orthopedics;  Laterality: Left;   LITHOTRIPSY     TONSILLECTOMY      HEMATOLOGY/ONCOLOGY HISTORY:  Oncology History Overview Note  # LYMPHOCYTOSIS [25,K]/ANEMIA 9-10]; platelets-N; IgVH Somatic Hypermutation was not detected.56% OF NUCLEI POSITIVE FOR A 13Q DELETION; The phenotype is most consistent with a diagnosis of 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 Low level hypermetabolism consistent with CLL.2. Splenomegaly but no focal splenic lesions.  # April 2022- 13 Lindsay [name]; OSKER- UNMUTATED  # MID April 2022- IBRUTINIB  280 mg x3 days [STOPPED sec ER/hypertensive emergency- 191/129]  # MAY 26th, 2022- start acalabrutinib  100 mg once a day [reduced dose]; intermittent; discontinued  September mid 2022. [Muscle cramps.]  # MAY 1st week- 2024- Zanubrutinib  80 mg; stopped after 1-2 weeks- sec to acute renal failure hyperkalemia/extreme fatigue etc.  Patient declined further therapies.       CLL (chronic lymphocytic leukemia) (HCC)  02/14/2021 Initial Diagnosis   CLL (chronic lymphocytic leukemia) (HCC)   04/07/2021 Cancer Staging   Staging form: Chronic Lymphocytic Leukemia / Small Lymphocytic Lymphoma, AJCC 8th Edition - Clinical: Modified Rai Stage III (Modified Rai risk: High, Binet:  Stage C) - Signed by Brahmanday, Govinda R, MD on 04/07/2021 Stage prefix: Initial diagnosis   12/16/2021 - 12/16/2021 Chemotherapy   Patient is on Treatment Plan : CLL - Rituximab q 4 weeks        ALLERGIES:  is allergic to gabapentin and demerol [meperidine].  MEDICATIONS:  Current Outpatient Medications  Medication Sig Dispense Refill   amitriptyline  (ELAVIL ) 10 MG tablet Take 20 mg by mouth at bedtime.     cetirizine (ZYRTEC) 5 MG tablet Take 5 mg by mouth.     cyanocobalamin  (,VITAMIN B-12,) 1000 MCG/ML injection Inject into the muscle every 14 (fourteen) days. Next injection 4/16     cyanocobalamin  (VITAMIN B12) 1000 MCG tablet Take 1,000 mcg by mouth daily.     DULoxetine  (CYMBALTA ) 20 MG capsule Take 20 mg by mouth daily.     Ferrous Sulfate 28 MG TABS Take 28 mg by mouth daily.     furosemide  (LASIX ) 20 MG tablet TAKE TWO TABLETS (40 MG TOTAL) BY MOUTH DAILY. 60 tablet 5   MELATONIN GUMMIES PO Take 3 mg by mouth at bedtime as needed.     ondansetron  (ZOFRAN ) 8 MG tablet TAKE ONE TABLET (8 MG TOTAL) BY MOUTH EVERY EIGHT HOURS AS NEEDED FOR NAUSEA OR VOMITING. 20 tablet 0   oxyCODONE -acetaminophen  (PERCOCET/ROXICET) 5-325 MG tablet Take 1-2 tablets by mouth every 4 (four) hours as needed for severe pain (pain score 7-10). 90 tablet 0   pantoprazole (PROTONIX) 40 MG tablet Take 40 mg by mouth daily.     polyethylene glycol powder (GLYCOLAX /MIRALAX ) 17 GM/SCOOP powder Take 17 g by mouth 2 (two) times daily as needed for moderate constipation. 255 g 0   potassium chloride  SA (KLOR-CON  M) 20 MEQ tablet TAKE ONE TABLET (20 MEQ TOTAL) BY MOUTH AS DIRECTED. BY THE HEART FAILURE CLINIC 30 tablet 0   predniSONE  (DELTASONE ) 20 MG tablet Take 1 tablet (20 mg total) by mouth daily with breakfast. Once a day with food- in AM. (Patient not taking: Reported on 09/30/2024) 30 tablet 1   No current facility-administered medications for this visit.    VITAL SIGNS: BP (!) 131/51   Pulse 75   Temp 97.6 F (36.4 C) (Tympanic)   Resp 18   Ht 5' 3 (1.6 m)   Wt 93 lb 12.8 oz (42.5 kg)   BMI 16.62 kg/m  Filed Weights   09/30/24 1010  Weight: 93 lb 12.8 oz (42.5 kg)    Estimated body  mass index is 16.62 kg/m as calculated from the following:   Height as of this encounter: 5' 3 (1.6 m).   Weight as of this encounter: 93 lb 12.8 oz (42.5 kg).  LABS: CBC:    Component Value Date/Time   WBC 118.4 (HH) 09/16/2024 0858   WBC 126.3 (HH) 06/11/2024 2234   HGB 8.9 (L) 09/16/2024 0858   HGB 14.1 03/06/2014 2049   HCT 30.0 (L) 09/16/2024 0858   HCT 42.9 03/06/2014 2049   PLT 170 09/16/2024 0858   PLT 240 03/06/2014 2049   MCV 107.5 (H) 09/16/2024 0858   MCV 88 03/06/2014 2049   NEUTROABS 4.7 09/16/2024 0858   NEUTROABS 7.3 (H) 03/06/2014 2049   LYMPHSABS 107.9 (H) 09/16/2024 0858   LYMPHSABS 6.6 (H) 03/06/2014 2049   MONOABS 2.7 (H) 09/16/2024 0858   MONOABS 1.3 (H) 03/06/2014 2049   EOSABS 2.6 (H) 09/16/2024 0858   EOSABS 0.4 03/06/2014 2049   BASOSABS 0.1 09/16/2024 0858   BASOSABS 0.1 03/06/2014 2049  Comprehensive Metabolic Panel:    Component Value Date/Time   NA 138 09/16/2024 0858   NA 138 09/04/2023 1154   NA 138 03/06/2014 2049   K 4.8 09/16/2024 0858   K 3.6 03/06/2014 2049   CL 111 09/16/2024 0858   CL 107 03/06/2014 2049   CO2 20 (L) 09/16/2024 0858   CO2 29 03/06/2014 2049   BUN 60 (H) 09/16/2024 0858   BUN 58 (H) 09/04/2023 1154   BUN 14 03/06/2014 2049   CREATININE 1.93 (H) 09/16/2024 0858   CREATININE 0.75 03/06/2014 2049   GLUCOSE 191 (H) 09/16/2024 0858   GLUCOSE 103 (H) 03/06/2014 2049   CALCIUM  8.4 (L) 09/16/2024 0858   CALCIUM  8.9 03/06/2014 2049   AST 19 06/11/2024 2234   AST 15 09/25/2023 0855   ALT 10 06/11/2024 2234   ALT 8 09/25/2023 0855   ALKPHOS 109 06/11/2024 2234   BILITOT 0.9 06/11/2024 2234   BILITOT 1.1 09/25/2023 0855   PROT 6.4 (L) 06/11/2024 2234   ALBUMIN 4.1 06/11/2024 2234    RADIOGRAPHIC STUDIES: No results found.  PERFORMANCE STATUS (ECOG) : 1 - Symptomatic but completely ambulatory  Review of Systems Unless otherwise noted, a complete review of systems is negative.  Physical Exam General:  NAD Cardiovascular: regular rate and rhythm Pulmonary: clear ant fields Abdomen: soft, nontender, + bowel sounds GU: no suprapubic tenderness Extremities: no edema, no joint deformities Skin: no rashes Neurological: Weakness but otherwise nonfocal  IMPRESSION/PLAN: CLL -off treatment due to poor tolerance.  Anemia -hemoglobin stable.  No need for transfusion today.  Confusion -patient appears to be at baseline during clinic visit.  Given untreated CLL, I would be concerned about possible infectious etiology/delirium.  However, UA was negative and no obvious fever or chills.  Patient is nontoxic-appearing.  No neurodeficits noted on exam.  Patient does have recent cough so we will check chest x-ray.  In the absence of specific findings, would likely just recommend supportive care.  Patient does likely have progression of her CLL  RTC 2 weeks  Addendum: X-ray showed opacity in the right lung concerning for possible pneumonia.  Rx sent for renally dosed Augmentin .  I spoke with daughter to update her.   Patient expressed understanding and was in agreement with this plan. She also understands that She can call clinic at any time with any questions, concerns, or complaints.   Thank you for allowing me to participate in the care of this very pleasant patient.   Time Total: 20 minutes  Visit consisted of counseling and education dealing with the complex and emotionally intense issues of symptom management in the setting of serious illness.Greater than 50%  of this time was spent counseling and coordinating care related to the above assessment and plan.  Signed by: Fonda Mower, PhD, NP-C

## 2024-09-30 NOTE — Addendum Note (Signed)
 Addended by: SHERLEEN FONDA SAUNDERS on: 09/30/2024 02:38 PM   Modules accepted: Orders

## 2024-09-30 NOTE — Progress Notes (Signed)
 1016-critical lab reported by Luke in cancer center lab. read back process performed - Critical wbc 155. Of note pt's hgb is 8.7. pt informed that she would not need any blood transfusion tomorrow based on hgb results today. Josh B, NP informed of critical wbc count 1022 am- read back process performed with provider.

## 2024-10-01 ENCOUNTER — Encounter: Payer: Self-pay | Admitting: Hospice and Palliative Medicine

## 2024-10-01 ENCOUNTER — Inpatient Hospital Stay: Attending: Nurse Practitioner

## 2024-10-01 LAB — URINE CULTURE: Culture: NO GROWTH

## 2024-10-03 ENCOUNTER — Telehealth: Payer: Self-pay

## 2024-10-03 NOTE — Telephone Encounter (Signed)
 Lvm to confirm appt 10/06/24

## 2024-10-06 ENCOUNTER — Encounter: Admitting: Family

## 2024-10-06 ENCOUNTER — Ambulatory Visit: Attending: Student in an Organized Health Care Education/Training Program | Admitting: Adult Health

## 2024-10-06 VITALS — BP 155/57 | HR 78 | Wt 93.8 lb

## 2024-10-06 DIAGNOSIS — I10 Essential (primary) hypertension: Secondary | ICD-10-CM

## 2024-10-06 DIAGNOSIS — N189 Chronic kidney disease, unspecified: Secondary | ICD-10-CM | POA: Insufficient documentation

## 2024-10-06 DIAGNOSIS — C9110 Chronic lymphocytic leukemia of B-cell type not having achieved remission: Secondary | ICD-10-CM | POA: Diagnosis not present

## 2024-10-06 DIAGNOSIS — I130 Hypertensive heart and chronic kidney disease with heart failure and stage 1 through stage 4 chronic kidney disease, or unspecified chronic kidney disease: Secondary | ICD-10-CM | POA: Diagnosis present

## 2024-10-06 DIAGNOSIS — D638 Anemia in other chronic diseases classified elsewhere: Secondary | ICD-10-CM | POA: Diagnosis not present

## 2024-10-06 DIAGNOSIS — M797 Fibromyalgia: Secondary | ICD-10-CM | POA: Insufficient documentation

## 2024-10-06 DIAGNOSIS — I447 Left bundle-branch block, unspecified: Secondary | ICD-10-CM | POA: Diagnosis not present

## 2024-10-06 DIAGNOSIS — K589 Irritable bowel syndrome without diarrhea: Secondary | ICD-10-CM | POA: Insufficient documentation

## 2024-10-06 DIAGNOSIS — K219 Gastro-esophageal reflux disease without esophagitis: Secondary | ICD-10-CM | POA: Insufficient documentation

## 2024-10-06 DIAGNOSIS — Z79899 Other long term (current) drug therapy: Secondary | ICD-10-CM | POA: Insufficient documentation

## 2024-10-06 DIAGNOSIS — I5022 Chronic systolic (congestive) heart failure: Secondary | ICD-10-CM | POA: Insufficient documentation

## 2024-10-06 DIAGNOSIS — G2581 Restless legs syndrome: Secondary | ICD-10-CM | POA: Diagnosis not present

## 2024-10-06 NOTE — Progress Notes (Signed)
 Advanced Heart Failure Clinic Note    PCP: Valora Agent, MD  Primary Cardiologist: Darliss Rogue, MD   Chief Complaint: Heart Failure  HPI: Lindsay [name] is a 89 y/o female with a history of HTN, CKD, CLL (not taking chemo), anemia, LBBB, IBS, fibromyalgia, GERD, RLS and heart failure. She was unable to tolerate treatment of CLL with zanubrutinib  due to hyperkalemia, renal failure and extreme fatigue and has elected to stop any other treatment for this.  Echo 05/30/22: EF of 45-50% along with mild/moderate MR  Admitted 06/18/23 due to shortness of breath due to HF exacerbation. Chest x-ray showed cardiomegaly and interstitial edema. BNP 1254, troponin 80--> 116. IV diuresed. Echo 06/19/23: EF 40-45% along with mild LVH, Grade II DD, mildly elevated PA pressure, mild/ moderate MR with moderate AS with Aortic valve area, by VTI measures 1.00 cm. Aortic valve mean gradient measures 13.0 mmHg.   Admitted 09/19/23 due to shortness of breath. Had SIRS without infection likely d/t anemia with history of CLL. Also found to be anemic with HG 5.7. Received 2 units of PRBC's with IV lasix  afterwards. Symptoms improved. Entresto  was stopped due to hypotension.   Was in the ED 06/11/24 with acute onset of bilateral leg swelling. Was given 2 units of PRBC's the day prior. Given IV lasix  with improvement of symptoms.   Off treatment for CLL due to previous side effects.   Today she returns for HF follow up with her daughter. Today is a good day for her.  Denies SOB/PND/Orthopnea. No falls. Appetite ok. No fever or chills. Weight at home has been stable. Taking all medications. Lives with her daughter.     ROS: All systems negative except what is listed in HPI, PMH and Problem List   Past Medical History:  Diagnosis Date   Arthritis    RHEUMATOID   CHF (congestive heart failure) (HCC)    CLL (chronic lymphocytic leukemia) (HCC)    Edema    MILD ANKLE OCCAS   Fibromyalgia    GERD  (gastroesophageal reflux disease)    Hypertension    Kidney stones    RLS (restless legs syndrome)     Current Outpatient Medications  Medication Sig Dispense Refill   amitriptyline  (ELAVIL ) 10 MG tablet Take 20 mg by mouth at bedtime.     amoxicillin -clavulanate (AUGMENTIN ) 500-125 MG tablet Take 1 tablet by mouth 2 (two) times daily. 20 tablet 0   cetirizine (ZYRTEC) 5 MG tablet Take 5 mg by mouth.     cyanocobalamin  (,VITAMIN B-12,) 1000 MCG/ML injection Inject into the muscle every 14 (fourteen) days. Next injection 4/16     cyanocobalamin  (VITAMIN B12) 1000 MCG tablet Take 1,000 mcg by mouth daily.     DULoxetine  (CYMBALTA ) 20 MG capsule Take 20 mg by mouth daily.     Ferrous Sulfate 28 MG TABS Take 28 mg by mouth daily.     furosemide  (LASIX ) 20 MG tablet TAKE TWO TABLETS (40 MG TOTAL) BY MOUTH DAILY. 60 tablet 5   MELATONIN GUMMIES PO Take 3 mg by mouth at bedtime as needed.     mometasone (NASONEX) 50 MCG/ACT nasal spray Place 2 sprays into the nose daily.     ondansetron  (ZOFRAN ) 8 MG tablet TAKE ONE TABLET (8 MG TOTAL) BY MOUTH EVERY EIGHT HOURS AS NEEDED FOR NAUSEA OR VOMITING. 20 tablet 0   oxyCODONE -acetaminophen  (PERCOCET/ROXICET) 5-325 MG tablet Take 1-2 tablets by mouth every 4 (four) hours as needed for severe pain (pain score 7-10).  90 tablet 0   pantoprazole (PROTONIX) 40 MG tablet Take 40 mg by mouth daily.     polyethylene glycol powder (GLYCOLAX /MIRALAX ) 17 GM/SCOOP powder Take 17 g by mouth 2 (two) times daily as needed for moderate constipation. 255 g 0   predniSONE  (DELTASONE ) 20 MG tablet Take 1 tablet (20 mg total) by mouth daily with breakfast. Once a day with food- in AM. 30 tablet 1   No current facility-administered medications for this visit.    Allergies  Allergen Reactions   Gabapentin Swelling    05/01/23: Pt stated she has facial swelling when taking Gabapentin.   Demerol [Meperidine]       Social History   Socioeconomic History   Marital  status: Widowed    Spouse name: Not on file   Number of children: Not on file   Years of education: Not on file   Highest education level: Not on file  Occupational History   Not on file  Tobacco Use   Smoking status: Never   Smokeless tobacco: Never  Substance and Sexual Activity   Alcohol use: No   Drug use: Never   Sexual activity: Not Currently  Other Topics Concern   Not on file  Social History Narrative   Lives with daughter, in [place]. 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    Social Drivers of Corporate investment banker Strain: Low Risk  (12/24/2023)   Received from [facility]   Overall Financial Resource Strain (CARDIA)    Difficulty of Paying Living Expenses: Not hard at all  Food Insecurity: No Food Insecurity (12/24/2023)   Received from [facility]   Hunger Vital Sign    Within the past 12 months, the food you bought just didn't last and you didn't have money to get more.: Never true    Within the past 12 months, you worried that your food would run out before you got the money to buy more.: Never true  Transportation Needs: No Transportation Needs (12/24/2023)   Received from [facility]   PRAPARE - Transportation    Lack of Transportation (Non-Medical): No    In the past 12 months, has lack of transportation kept you from medical appointments or from getting medications?: No  Physical Activity: Not on file  Stress: Not on file  Social Connections: Not on file  Intimate Partner Violence: Not At Risk (09/19/2023)   Humiliation, Afraid, Rape, and Kick questionnaire    Fear of Current or Ex-Partner: No    Emotionally Abused: No    Physically Abused: No    Sexually Abused: No      Family History  Problem Relation Age of Onset   Cancer Mother        uterine cancer    Vitals:   10/06/24 1106  BP: (!) 155/57  Pulse: 78  SpO2: 98%     PHYSICAL EXAM: General:Thin.  No resp  difficulty Neck: no JVD.  Cor: Regular rate & rhythm.  Lungs: clear Abdomen: soft, nontender, nondistended.  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 Aortic valve mean gradient measures 13.0 mmHg.  -GDMT limited by hypotension,hyperkalemia, UTI.  -  NYHA II. Appears euvolemic. Continue lasix  40 mg daily.  -Recent lab work stable.   2: HTN- - Elevated but given age and frailty would not add medications at this point.   3: CLL - Followed by oncology + palliative care  - disease progressing; off treatment due to intolerance.   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   Greig Mosses, NP 10/06/24

## 2024-10-07 ENCOUNTER — Encounter: Admitting: Family

## 2024-10-10 ENCOUNTER — Encounter: Payer: Self-pay | Admitting: Hospice and Palliative Medicine

## 2024-10-11 ENCOUNTER — Emergency Department
Admission: EM | Admit: 2024-10-11 | Discharge: 2024-10-11 | Disposition: A | Discharge status: Home / Self Care | Attending: Emergency Medicine | Admitting: Emergency Medicine

## 2024-10-11 ENCOUNTER — Emergency Department

## 2024-10-11 ENCOUNTER — Other Ambulatory Visit: Payer: Self-pay

## 2024-10-11 ENCOUNTER — Encounter: Payer: Self-pay | Admitting: Emergency Medicine

## 2024-10-11 DIAGNOSIS — R600 Localized edema: Secondary | ICD-10-CM | POA: Insufficient documentation

## 2024-10-11 DIAGNOSIS — I130 Hypertensive heart and chronic kidney disease with heart failure and stage 1 through stage 4 chronic kidney disease, or unspecified chronic kidney disease: Secondary | ICD-10-CM | POA: Diagnosis not present

## 2024-10-11 DIAGNOSIS — N189 Chronic kidney disease, unspecified: Secondary | ICD-10-CM | POA: Insufficient documentation

## 2024-10-11 DIAGNOSIS — I509 Heart failure, unspecified: Secondary | ICD-10-CM | POA: Diagnosis not present

## 2024-10-11 LAB — CBC
HCT: 34 % — ABNORMAL LOW (ref 36.0–46.0)
Hemoglobin: 9.2 g/dL — ABNORMAL LOW (ref 12.0–15.0)
MCH: 30.4 pg (ref 26.0–34.0)
MCHC: 27.1 g/dL — ABNORMAL LOW (ref 30.0–36.0)
MCV: 112.2 fL — ABNORMAL HIGH (ref 80.0–100.0)
Platelets: 147 K/uL — ABNORMAL LOW (ref 150–400)
RBC: 3.03 MIL/uL — ABNORMAL LOW (ref 3.87–5.11)
RDW: 16.5 % — ABNORMAL HIGH (ref 11.5–15.5)
WBC: 201.5 K/uL (ref 4.0–10.5)
nRBC: 0 % (ref 0.0–0.2)

## 2024-10-11 LAB — COMPREHENSIVE METABOLIC PANEL WITH GFR
ALT: 23 U/L (ref 0–44)
AST: 19 U/L (ref 15–41)
Albumin: 3.7 g/dL (ref 3.5–5.0)
Alkaline Phosphatase: 67 U/L (ref 38–126)
Anion gap: 13 (ref 5–15)
BUN: 51 mg/dL — ABNORMAL HIGH (ref 8–23)
CO2: 24 mmol/L (ref 22–32)
Calcium: 8.2 mg/dL — ABNORMAL LOW (ref 8.9–10.3)
Chloride: 101 mmol/L (ref 98–111)
Creatinine, Ser: 1.46 mg/dL — ABNORMAL HIGH (ref 0.44–1.00)
GFR, Estimated: 34 mL/min — ABNORMAL LOW (ref >60)
Glucose, Bld: 115 mg/dL — ABNORMAL HIGH (ref 70–99)
Potassium: 4.4 mmol/L (ref 3.5–5.1)
Sodium: 138 mmol/L (ref 135–145)
Total Bilirubin: 0.9 mg/dL (ref 0.0–1.2)
Total Protein: 6.3 g/dL — ABNORMAL LOW (ref 6.5–8.1)

## 2024-10-11 NOTE — ED Notes (Signed)
 Pt assisted to the restroom and then back into bed, provided with additional warm blank,

## 2024-10-11 NOTE — Discharge Instructions (Addendum)
 Continue the dressing changes for the wounds on the arm.  Keep the arm elevated to decrease the swelling.  Follow-up with your primary care provider.  Return to the ER immediately for new, worsening, or persistent severe swelling, pain, bleeding, weakness or numbness, or any other new or worsening symptoms that concern you.    RETURN PRECAUTIONS & AFTERCARE: (ENGLISH) RETURN PRECAUTIONS: Return immediately to the emergency department or see/call your doctor if you feel worse, weak or have changes in speech or vision, are short of breath, have fever, vomiting, pain, bleeding or dark stool, trouble urinating or any new issues. Return here or see/call your doctor if not improving as expected for your suspected condition. FOLLOW-UP CARE: Call your doctor and/or any doctors we referred you to for more advice and to make an appointment. Do this today, tomorrow or after the weekend. Some doctors only take PPO insurance so if you have HMO insurance you may want to contact your HMO or your regular doctor for referral to a specialist within your plan. Either way tell the doctor's office that it was a referral from the emergency department so you get the soonest possible appointment.  YOUR TEST RESULTS: Take result reports of any blood or urine tests, imaging tests and EKG's to your doctor and any referral doctor. Have any abnormal tests repeated. Your doctor or a referral doctor can let you know when this should be done. Also make sure your doctor contacts this hospital to get any test results that are not currently available such as cultures or special tests for infection and final imaging reports, which are often not available at the time you leave the ER but which may list additional important findings that are not documented on the preliminary report. BLOOD PRESSURE: If your blood pressure was greater than 120/80 have your blood pressure rechecked within 1 to 2 weeks. MEDICATION SIDE EFFECTS: Do not drive, walk,  bike, take the bus, etc. if you have received or are being prescribed any sedating medications such as those for pain or anxiety or certain antihistamines like Benadryl . If you have been give one of these here get a taxi home or have a friend drive you home. Ask your pharmacist to counsel you on potential side effects of any new medication

## 2024-10-11 NOTE — ED Provider Notes (Signed)
 [facility] Provider Note    Event Date/Time   First MD Initiated Contact with Patient 10/11/24 1315     (approximate)   History   Fall and Arm Swelling   HPI  Lindsay [name] is a 89 y.o. female with history of hypertension, CKD, CLL (not on chemo) anemia, LBBB, IBS, fibromyalgia, GERD, RLS, and CHF who presents with right arm swelling.  The daughter reports that about 10 days ago, the patient had a mechanical fall, sliding between the toilet and shower causing skin tears to her right upper arm.  Her daughter, who is a engineer, civil (consulting), has been doing dressing changes and wound care.  The arm has been doing well until this morning when she suddenly noticed swelling throughout the arm.  The patient denies any acute pain.  She has no weakness or numbness in the arm.  She has had no new trauma.  There is no redness or warmth.  There is no purulent drainage.  I reviewed the past medical records.  The patient was most recently seen in the heart failure clinic on 10/20 for follow-up.  She stated that she was feeling well at that time.  Outpatient chest x-ray on 10/14 showed a possible right infrahilar bronchopneumonia.   Physical Exam   Triage Vital Signs: ED Triage Vitals [10/11/24 1207]  Encounter Vitals Group     BP (!) 130/101     Girls Systolic BP Percentile      Girls Diastolic BP Percentile      Boys Systolic BP Percentile      Boys Diastolic BP Percentile      Pulse Rate 83     Resp 18     Temp 98.1 F (36.7 C)     Temp Source Oral     SpO2 100 %     Weight 93 lb 11.1 oz (42.5 kg)     Height 5' 3 (1.6 m)     Head Circumference      Peak Flow      Pain Score 0     Pain Loc      Pain Education      Exclude from Growth Chart     Most recent vital signs: Vitals:   10/11/24 1207  BP: (!) 130/101  Pulse: 83  Resp: 18  Temp: 98.1 F (36.7 C)  SpO2: 100%     General: Awake, no distress.  CV:  Good peripheral perfusion.  Resp:  Normal effort.   Abd:  No distention.  Other:  Right upper extremity with very slight erythema compared to the left.  2+ radial pulse.  Cap refill less than 2 seconds distally.  Motor and sensory intact in median, radial, and ulnar distributions.  Upper arm skin tears healing well.  No erythema, induration, or abnormal warmth.  No purulent drainage.  No significant tenderness.  Full range of motion at the elbow and shoulder.   ED Results / Procedures / Treatments   Labs (all labs ordered are listed, but only abnormal results are displayed) Labs Reviewed  COMPREHENSIVE METABOLIC PANEL WITH GFR - Abnormal; Notable for the following components:      Result Value   Glucose, Bld 115 (*)    BUN 51 (*)    Creatinine, Ser 1.46 (*)    Calcium  8.2 (*)    Total Protein 6.3 (*)    GFR, Estimated 34 (*)    All other components within normal limits  CBC - Abnormal; Notable for  the following components:   WBC 201.5 (*)    RBC 3.03 (*)    Hemoglobin 9.2 (*)    HCT 34.0 (*)    MCV 112.2 (*)    MCHC 27.1 (*)    RDW 16.5 (*)    Platelets 147 (*)    All other components within normal limits     EKG  ED ECG REPORT I, Waylon Cassis, the attending physician, personally viewed and interpreted this ECG.  Date: 10/11/2024 EKG Time: 1210 Rate: 86 Rhythm: normal sinus rhythm with PVCs QRS Axis: Left axis Intervals: normal ST/T Wave abnormalities: normal Narrative Interpretation: no evidence of acute ischemia     RADIOLOGY  XR R forearm: I independently viewed and interpreted the images; there is no acute fracture   US  venous RUE: Pending  PROCEDURES:  Critical Care performed: No  Procedures   MEDICATIONS ORDERED IN ED: Medications - No data to display   IMPRESSION / MDM / ASSESSMENT AND PLAN / ED COURSE  I reviewed the triage vital signs and the nursing notes.  89 year old female with PMH as noted above presents with right arm mild swelling after a fall with skin tears that occurred  about 10 days ago.  She has had no new trauma.  Exam reveals that the right upper extremity is neuro/vascular intact with very slight swelling.  \  Differential diagnosis includes, but is not limited to, peripheral edema, hematoma, less likely DVT.  There is no evidence of cellulitis or abscess.  Patient's presentation is most consistent with acute complicated illness / injury requiring diagnostic workup.  CBC shows severe leukocytosis consistent with the patient's baseline with untreated CLL.  Hemoglobin is 9.2.  Platelets are just below normal.  CMP shows no acute findings.  X-ray shows no evidence of fracture.  We will obtain an ultrasound for further evaluation.  ----------------------------------------- 3:41 PM on 10/11/2024 -----------------------------------------  Ultrasound is pending.  I have signed the patient out to the oncoming ED physician Dr. Nicholaus.  Clinical Course as of 10/11/24 1541  Sat Oct 11, 2024  1510 Received signout. Has CLL not on treatment. Fell 1 week ago. Right arm more swollen. Good pulses. No signs of cellulitis.  [HD]    Clinical Course User Index [HD] Nicholaus Rolland BRAVO, MD     FINAL CLINICAL IMPRESSION(S) / ED DIAGNOSES   Final diagnoses:  Edema of right upper arm     Rx / DC Orders   ED Discharge Orders     None        Note:  This document was prepared using Dragon voice recognition software and may include unintentional dictation errors.    Cassis Waylon, MD 10/11/24 [phone]

## 2024-10-11 NOTE — ED Provider Notes (Signed)
 Clinical Course as of 10/11/24 1616  Sat Oct 11, 2024  1510 Received signout. Has CLL not on treatment. Fell 1 week ago. Right arm more swollen. Good pulses. No signs of cellulitis.  [HD]  1616 Patient reassessed and she is well-appearing.  Reviewed the results of the Doppler ultrasound which demonstrated no DVT but worsening lymphadenopathy and consistent with worsening CLL.  Patient has an appointment with her oncologist on Monday but she does not want any further treatment.  She wants just to return home.  All questions answered to patient's and daughters satisfaction.  Will work on discharge [HD]    Clinical Course User Index [HD] Nicholaus Rolland BRAVO, MD    At time of discharge there is no evidence of acute life, limb, vision, or fertility threat. Patient has stable vital signs, pain is well controlled, patient is ambulatory and p.o. tolerant.  Discharge instructions were completed using the EPIC system. I would refer you to those at this time. All warnings prescriptions follow-up etc. were discussed in detail with the patient. Patient indicates understanding and is agreeable with this plan. All questions answered.  Patient is made aware that they may return to the emergency department for any worsening or new condition or for any other emergency.    Nicholaus Rolland BRAVO, MD 10/11/24 [phone]

## 2024-10-11 NOTE — ED Triage Notes (Signed)
 Pt fell on 10/15 and missed the commode. Daughter picked her up between the commode and shower. Pt had told daughter she was dizzy when she fell. Right lower arm swelling and skin tears to upper arm. Pt on abx for possible PNA and no more cough.

## 2024-10-13 ENCOUNTER — Inpatient Hospital Stay

## 2024-10-13 ENCOUNTER — Telehealth: Payer: Self-pay | Admitting: *Deleted

## 2024-10-13 ENCOUNTER — Other Ambulatory Visit: Payer: Self-pay | Admitting: *Deleted

## 2024-10-13 DIAGNOSIS — C9110 Chronic lymphocytic leukemia of B-cell type not having achieved remission: Secondary | ICD-10-CM | POA: Diagnosis not present

## 2024-10-13 LAB — BASIC METABOLIC PANEL - CANCER CENTER ONLY
Anion gap: 11 (ref 5–15)
BUN: 52 mg/dL — ABNORMAL HIGH (ref 8–23)
CO2: 23 mmol/L (ref 22–32)
Calcium: 8.3 mg/dL — ABNORMAL LOW (ref 8.9–10.3)
Chloride: 104 mmol/L (ref 98–111)
Creatinine: 1.51 mg/dL — ABNORMAL HIGH (ref 0.44–1.00)
GFR, Estimated: 33 mL/min — ABNORMAL LOW (ref >60)
Glucose, Bld: 167 mg/dL — ABNORMAL HIGH (ref 70–99)
Potassium: 3.9 mmol/L (ref 3.5–5.1)
Sodium: 138 mmol/L (ref 135–145)

## 2024-10-13 LAB — CBC WITH DIFFERENTIAL (CANCER CENTER ONLY)
Abs Immature Granulocytes: 0.49 K/uL — ABNORMAL HIGH (ref 0.00–0.07)
Basophils Absolute: 0 K/uL (ref 0.0–0.1)
Basophils Relative: 0 %
Eosinophils Absolute: 1.2 K/uL — ABNORMAL HIGH (ref 0.0–0.5)
Eosinophils Relative: 1 %
HCT: 31.9 % — ABNORMAL LOW (ref 36.0–46.0)
Hemoglobin: 8.9 g/dL — ABNORMAL LOW (ref 12.0–15.0)
Immature Granulocytes: 0 %
Lymphocytes Relative: 95 %
Lymphs Abs: 141.7 K/uL — ABNORMAL HIGH (ref 0.7–4.0)
MCH: 31.1 pg (ref 26.0–34.0)
MCHC: 27.9 g/dL — ABNORMAL LOW (ref 30.0–36.0)
MCV: 111.5 fL — ABNORMAL HIGH (ref 80.0–100.0)
Monocytes Absolute: 0.8 K/uL (ref 0.1–1.0)
Monocytes Relative: 1 %
Neutro Abs: 4 K/uL (ref 1.7–7.7)
Neutrophils Relative %: 3 %
Platelet Count: 124 K/uL — ABNORMAL LOW (ref 150–400)
RBC: 2.86 MIL/uL — ABNORMAL LOW (ref 3.87–5.11)
RDW: 15.8 % — ABNORMAL HIGH (ref 11.5–15.5)
WBC Count: 148.2 K/uL (ref 4.0–10.5)
nRBC: 0 % (ref 0.0–0.2)

## 2024-10-13 LAB — LACTATE DEHYDROGENASE: LDH: 148 U/L (ref 98–192)

## 2024-10-13 LAB — SAMPLE TO BLOOD BANK

## 2024-10-13 NOTE — Telephone Encounter (Signed)
 I told her daughter that she does not need to get a blood transfusion and because of that the daughter wanted to change the appt. Daughter says that the patient and her her back and she is going to Chiropractic to help her back. They moved  the appts.  10/27/24 at 9:00, then same day at  at 9;30 to see NP. If blood transfusion on 10am on  10/28/24.  She is ok  with that the plan.

## 2024-10-13 NOTE — Telephone Encounter (Signed)
 Hgb 8.9 . No Blood Transfusion needed tomorrow.

## 2024-10-13 NOTE — Telephone Encounter (Signed)
 Critical lab called by [facility] in Lab; WBC 148.2 Readback. Tinnie Dawn notified.

## 2024-10-14 ENCOUNTER — Inpatient Hospital Stay

## 2024-10-14 ENCOUNTER — Inpatient Hospital Stay: Admitting: Hospice and Palliative Medicine

## 2024-10-14 ENCOUNTER — Inpatient Hospital Stay: Admitting: Nurse Practitioner

## 2024-10-17 ENCOUNTER — Encounter: Payer: Self-pay | Admitting: Internal Medicine

## 2024-10-17 ENCOUNTER — Other Ambulatory Visit: Payer: Self-pay | Admitting: Hospice and Palliative Medicine

## 2024-10-27 ENCOUNTER — Other Ambulatory Visit: Payer: Self-pay

## 2024-10-27 ENCOUNTER — Telehealth: Payer: Self-pay | Admitting: *Deleted

## 2024-10-27 ENCOUNTER — Inpatient Hospital Stay: Admitting: Nurse Practitioner

## 2024-10-27 ENCOUNTER — Inpatient Hospital Stay: Attending: Nurse Practitioner

## 2024-10-27 ENCOUNTER — Encounter: Payer: Self-pay | Admitting: Nurse Practitioner

## 2024-10-27 VITALS — BP 145/101 | HR 86 | Temp 97.6°F | Ht 63.0 in | Wt 93.6 lb

## 2024-10-27 DIAGNOSIS — C9110 Chronic lymphocytic leukemia of B-cell type not having achieved remission: Secondary | ICD-10-CM

## 2024-10-27 DIAGNOSIS — M545 Low back pain, unspecified: Secondary | ICD-10-CM | POA: Diagnosis not present

## 2024-10-27 DIAGNOSIS — I130 Hypertensive heart and chronic kidney disease with heart failure and stage 1 through stage 4 chronic kidney disease, or unspecified chronic kidney disease: Secondary | ICD-10-CM | POA: Insufficient documentation

## 2024-10-27 DIAGNOSIS — Z87440 Personal history of urinary (tract) infections: Secondary | ICD-10-CM | POA: Insufficient documentation

## 2024-10-27 DIAGNOSIS — N183 Chronic kidney disease, stage 3 unspecified: Secondary | ICD-10-CM | POA: Insufficient documentation

## 2024-10-27 DIAGNOSIS — Z87442 Personal history of urinary calculi: Secondary | ICD-10-CM | POA: Diagnosis not present

## 2024-10-27 DIAGNOSIS — Z7952 Long term (current) use of systemic steroids: Secondary | ICD-10-CM | POA: Insufficient documentation

## 2024-10-27 DIAGNOSIS — Z79899 Other long term (current) drug therapy: Secondary | ICD-10-CM | POA: Insufficient documentation

## 2024-10-27 DIAGNOSIS — D630 Anemia in neoplastic disease: Secondary | ICD-10-CM | POA: Diagnosis not present

## 2024-10-27 DIAGNOSIS — Z87891 Personal history of nicotine dependence: Secondary | ICD-10-CM | POA: Insufficient documentation

## 2024-10-27 DIAGNOSIS — Z7951 Long term (current) use of inhaled steroids: Secondary | ICD-10-CM | POA: Insufficient documentation

## 2024-10-27 DIAGNOSIS — E876 Hypokalemia: Secondary | ICD-10-CM | POA: Diagnosis not present

## 2024-10-27 LAB — URINALYSIS, COMPLETE (UACMP) WITH MICROSCOPIC
Bilirubin Urine: NEGATIVE
Glucose, UA: NEGATIVE mg/dL
Hgb urine dipstick: NEGATIVE
Ketones, ur: NEGATIVE mg/dL
Leukocytes,Ua: NEGATIVE
Nitrite: NEGATIVE
Protein, ur: NEGATIVE mg/dL
RBC / HPF: 0 RBC/hpf (ref 0–5)
Specific Gravity, Urine: 1.011 (ref 1.005–1.030)
Squamous Epithelial / HPF: 0 /HPF (ref 0–5)
pH: 5 (ref 5.0–8.0)

## 2024-10-27 LAB — CBC WITH DIFFERENTIAL (CANCER CENTER ONLY)
Abs Immature Granulocytes: 0.44 K/uL — ABNORMAL HIGH (ref 0.00–0.07)
Basophils Absolute: 0 K/uL (ref 0.0–0.1)
Basophils Relative: 0 %
Eosinophils Absolute: 0.8 K/uL — ABNORMAL HIGH (ref 0.0–0.5)
Eosinophils Relative: 1 %
HCT: 33.3 % — ABNORMAL LOW (ref 36.0–46.0)
Hemoglobin: 9.7 g/dL — ABNORMAL LOW (ref 12.0–15.0)
Immature Granulocytes: 0 %
Lymphocytes Relative: 95 %
Lymphs Abs: 135.3 K/uL — ABNORMAL HIGH (ref 0.7–4.0)
MCH: 31.6 pg (ref 26.0–34.0)
MCHC: 29.1 g/dL — ABNORMAL LOW (ref 30.0–36.0)
MCV: 108.5 fL — ABNORMAL HIGH (ref 80.0–100.0)
Monocytes Absolute: 1.2 K/uL — ABNORMAL HIGH (ref 0.1–1.0)
Monocytes Relative: 1 %
Neutro Abs: 4.5 K/uL (ref 1.7–7.7)
Neutrophils Relative %: 3 %
Platelet Count: 148 K/uL — ABNORMAL LOW (ref 150–400)
RBC: 3.07 MIL/uL — ABNORMAL LOW (ref 3.87–5.11)
RDW: 14.6 % (ref 11.5–15.5)
WBC Count: 142.3 K/uL (ref 4.0–10.5)
nRBC: 0 % (ref 0.0–0.2)

## 2024-10-27 LAB — SAMPLE TO BLOOD BANK

## 2024-10-27 LAB — BASIC METABOLIC PANEL - CANCER CENTER ONLY
Anion gap: 11 (ref 5–15)
BUN: 62 mg/dL — ABNORMAL HIGH (ref 8–23)
CO2: 23 mmol/L (ref 22–32)
Calcium: 8.2 mg/dL — ABNORMAL LOW (ref 8.9–10.3)
Chloride: 100 mmol/L (ref 98–111)
Creatinine: 1.85 mg/dL — ABNORMAL HIGH (ref 0.44–1.00)
GFR, Estimated: 26 mL/min — ABNORMAL LOW (ref >60)
Glucose, Bld: 190 mg/dL — ABNORMAL HIGH (ref 70–99)
Potassium: 4 mmol/L (ref 3.5–5.1)
Sodium: 134 mmol/L — ABNORMAL LOW (ref 135–145)

## 2024-10-27 LAB — LACTATE DEHYDROGENASE: LDH: 163 U/L (ref 98–192)

## 2024-10-27 NOTE — Telephone Encounter (Signed)
 Attempted to submit order for DME for lift chair. Patient's will not cover for lift chair since pt is able to transfer out of her chair to standing position. Insurance will only cover the lift chair if patient's had debilitating arthritis in hips or knees or has a neuromuscular disorder. I contacted daughter and explained this. She will try and look for a cheaper options on market places and other DME venders in the community.

## 2024-10-27 NOTE — Progress Notes (Signed)
 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 Low level hypermetabolism consistent with CLL.2. Splenomegaly but no focal splenic lesions.  # April 2022- 13 Q DEL; IGVH- UNMUTATED  # MID April 2022- IBRUTINIB  280 mg x3 days [STOPPED sec ER/hypertensive emergency- 191/129]  # MAY 26th, 2022- start acalabrutinib  100 mg once a day [reduced dose]; intermittent; discontinued  September mid 2022. [Muscle cramps.]  # MAY 1st week- 2024- Zanubrutinib  80 mg; stopped after 1-2 weeks- sec to acute renal failure hyperkalemia/extreme fatigue etc.  Patient declined further therapies.       CLL (chronic lymphocytic leukemia) (HCC)  02/14/2021 Initial Diagnosis   CLL (chronic lymphocytic leukemia) (HCC)   04/07/2021 Cancer Staging   Staging form: Chronic Lymphocytic Leukemia / Small Lymphocytic Lymphoma, AJCC 8th Edition - Clinical: Modified Rai Stage III (Modified Rai risk: High, Binet: Stage C) - Signed by Rennie Cindy SAUNDERS, MD on 04/07/2021 Stage prefix: Initial diagnosis   12/16/2021 - 12/16/2021 Chemotherapy   Patient is on Treatment Plan : CLL - Rituximab q 4 weeks      HISTORY OF PRESENTING ILLNESS: Frail-appearing Caucasian female patient/accompanied by daughter in wheelchair.  Lindsay [name] Pepper 89 y.o. female symptomatic CLL, currently on on surveillance because of intolerance to ibrutinib /acalabrutinib /zanubrutinib   & congestive heart failure, is here for follow-up.   Presents today for repeat labs and possible blood transfusion tomorrow.  Hemoglobin was stable above 8 does not require blood transfusion this week.  However patient and her daughter had some concerns during visit today.  She reports that she has had left-sided lower back pain for the past week.  She reports a history of having back pain but reports that this has not happened in a very long time.  She does have a history of UTIs we discussed going ahead and collecting urine to rule out a UTI as a contributing factor during today's visit.  We also discussed getting her pain medication regimen on a schedule for better pain control.  She denies any numbness tingling no unilateral weakness.  She has been following up with a chiropractor and has only seen him 3 times.  She denies any saddle paresthesia or sciatica symptoms.  We can also always consider an x-ray if patient is in agreement, however unlikely a candidate for any other intervention other than conservative measures.  We will try to get on a better pain management regimen and will add acute infection first.  Patient's daughter reports that she sits and spends most of her time in a wooden rocking chair which may not give her enough support and she is unable to elevate her legs as high as needed.  She has been in a recliner as well which has been more than the rocking chair.  However we have submitted an order for a lift chair to see if insurance would help with cost and we will continue to follow this.  Today she does have bilateral lower extremity pitting edema.  Appears to be slightly worse on the left lower  extremity.  She was able to get her shoes on this morning, but it is around +1 pitting edema bilateral lower extremities but midway through Both calves.  She has a history of heart failure and is currently on Lasix  daily.  She has been instructed by her cardiologist that she can take an extra Lasix   for 3 days at a time.  I advised her to do this and if no improvement she could also call cardiology and be sure she is elevating extremities and using her compression stockings as well.  She denies any worsening shortness of breath no cough or congestion.  Daily prednisone  could also be a contributing factor in edema as well.    Complains of ongoing fatigue.   Review of Systems  Constitutional:  Positive for malaise/fatigue and weight loss. Negative for chills, diaphoresis and fever.  HENT:  Negative for nosebleeds and sore throat.   Eyes:  Negative for double vision.  Respiratory:  Negative for cough, hemoptysis, sputum production, shortness of breath and wheezing.   Cardiovascular:  Positive for leg swelling. Negative for chest pain, palpitations and orthopnea.  Gastrointestinal:  Negative for abdominal pain, constipation, diarrhea, heartburn, melena, nausea and vomiting.  Genitourinary:  Negative for dysuria, frequency and urgency.  Musculoskeletal:  Positive for back pain and joint pain.  Skin: Negative.  Negative for itching and rash.  Neurological:  Negative for dizziness, tingling, focal weakness, weakness and headaches.  Endo/Heme/Allergies:  Bruises/bleeds easily.  Psychiatric/Behavioral:  Negative for depression. The patient is not nervous/anxious and does not have insomnia.      MEDICAL HISTORY:  Past Medical History:  Diagnosis Date   Arthritis    RHEUMATOID   CHF (congestive heart failure) (HCC)    CLL (chronic lymphocytic leukemia) (HCC)    Edema    MILD ANKLE OCCAS   Fibromyalgia    GERD (gastroesophageal reflux disease)    Hypertension    Kidney stones    RLS (restless legs syndrome)     SURGICAL HISTORY: Past Surgical History:  Procedure Laterality Date   ABDOMINAL HYSTERECTOMY     CATARACT EXTRACTION W/PHACO Left 02/07/2016   Procedure: CATARACT EXTRACTION PHACO AND INTRAOCULAR LENS PLACEMENT (IOC);  Surgeon: Steven Dingeldein, MD;  Location: [facility] ORS;   Service: Ophthalmology;  Laterality: Left;  US  01:33 AP% 24.7 CDE 39.57 fluid pack lot # [id number]   EXTRACORPOREAL SHOCK WAVE LITHOTRIPSY Right 07/03/2019   Procedure: EXTRACORPOREAL SHOCK WAVE LITHOTRIPSY (ESWL);  Surgeon: Kassie Ozell SAUNDERS, MD;  Location: [facility] ORS;  Service: Urology;  Laterality: Right;   EYE SURGERY     HIP ARTHROPLASTY Left 04/20/2020   Procedure: ARTHROPLASTY BIPOLAR HIP (HEMIARTHROPLASTY);  Surgeon: Kathlynn Ozell, MD;  Location: [facility] ORS;  Service: Orthopedics;  Laterality: Left;   LITHOTRIPSY     TONSILLECTOMY      SOCIAL HISTORY: Social History   Socioeconomic History   Marital status: Widowed    Spouse name: Not on file   Number of children: Not on file   Years of education: Not on file   Highest education level: Not on file  Occupational History   Not on file  Tobacco Use   Smoking status: Never   Smokeless tobacco: Never  Substance and Sexual Activity   Alcohol use: No   Drug use: Never   Sexual activity: Not Currently  Other Topics Concern   Not on file  Social History Narrative   Lives with daughter, in [place]. Remote smoking; no alcohol. 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    Social  Drivers of Corporate Investment Banker Strain: Low Risk  (12/24/2023)   Received from [facility]   Overall Financial Resource Strain (CARDIA)    Difficulty of Paying Living Expenses: Not hard at all  Food Insecurity: No Food Insecurity (12/24/2023)   Received from [facility]   Hunger Vital Sign    Within the past 12 months, the food you bought just didn't last and you didn't have money to get more.: Never true    Within the past 12 months, you worried that your food would run out before you got the money to buy more.: Never true  Transportation Needs: No Transportation Needs (12/24/2023)   Received from [facility]   PRAPARE - Transportation    Lack of Transportation (Non-Medical): No    In the past 12 months, has lack of  transportation kept you from medical appointments or from getting medications?: No  Physical Activity: Not on file  Stress: Not on file  Social Connections: Not on file  Intimate Partner Violence: Not At Risk (09/19/2023)   Humiliation, Afraid, Rape, and Kick questionnaire    Fear of Current or Ex-Partner: No    Emotionally Abused: No    Physically Abused: No    Sexually Abused: No    FAMILY HISTORY: Family History  Problem Relation Age of Onset   Cancer Mother        uterine cancer    ALLERGIES:  is allergic to gabapentin and demerol [meperidine].  MEDICATIONS:  Current Outpatient Medications  Medication Sig Dispense Refill   amitriptyline  (ELAVIL ) 10 MG tablet Take 20 mg by mouth at bedtime.     cetirizine (ZYRTEC) 5 MG tablet Take 5 mg by mouth.     cyanocobalamin  (,VITAMIN B-12,) 1000 MCG/ML injection Inject into the muscle every 14 (fourteen) days. Next injection 4/16     cyanocobalamin  (VITAMIN B12) 1000 MCG tablet Take 1,000 mcg by mouth daily.     DULoxetine  (CYMBALTA ) 20 MG capsule Take 20 mg by mouth daily.     Ferrous Sulfate 28 MG TABS Take 28 mg by mouth daily.     furosemide  (LASIX ) 20 MG tablet TAKE TWO TABLETS (40 MG TOTAL) BY MOUTH DAILY. 60 tablet 5   MELATONIN GUMMIES PO Take 3 mg by mouth at bedtime as needed.     mometasone (NASONEX) 50 MCG/ACT nasal spray Place 2 sprays into the nose daily.     ondansetron  (ZOFRAN ) 8 MG tablet TAKE ONE TABLET (8 MG TOTAL) BY MOUTH EVERY EIGHT HOURS AS NEEDED FOR NAUSEA OR VOMITING. 20 tablet 0   oxyCODONE -acetaminophen  (PERCOCET/ROXICET) 5-325 MG tablet Take 1-2 tablets by mouth every 4 (four) hours as needed for severe pain (pain score 7-10). 90 tablet 0   pantoprazole (PROTONIX) 40 MG tablet Take 40 mg by mouth daily.     polyethylene glycol powder (GLYCOLAX /MIRALAX ) 17 GM/SCOOP powder Take 17 g by mouth 2 (two) times daily as needed for moderate constipation. 255 g 0   predniSONE  (DELTASONE ) 20 MG tablet Take 1 tablet (20  mg total) by mouth daily with breakfast. Once a day with food- in AM. 30 tablet 1   No current facility-administered medications for this visit.    PHYSICAL EXAMINATION: ECOG PERFORMANCE STATUS: 0 - Asymptomatic  Vitals:   10/27/24 1027 10/27/24 1028  BP: (!) 161/101 (!) 145/101  Pulse: 86 86  Temp:  97.6 F (36.4 C)   Filed Weights   10/27/24 1028  Weight: 93 lb  9.6 oz (42.5 kg)   Physical Exam Vitals reviewed.  Constitutional:      Appearance: She is not ill-appearing.  HENT:     Head: Normocephalic and atraumatic.  Cardiovascular:     Rate and Rhythm: Normal rate and regular rhythm.  Pulmonary:     Effort: Pulmonary effort is normal. No respiratory distress.     Breath sounds: Normal breath sounds. No wheezing.  Abdominal:     General: There is no distension.     Palpations: Abdomen is soft.     Tenderness: There is no abdominal tenderness. There is no guarding or rebound.  Musculoskeletal:        General: No tenderness.  Lymphadenopathy:     Comments: Bulky bilateral neck adenopathy; underarm adenopathy.  Skin:    General: Skin is warm.     Coloration: Skin is not pale.     Comments: Chronic multiple bruises noted.  Neurological:     Mental Status: She is alert and oriented to person, place, and time.  Psychiatric:        Mood and Affect: Mood and affect normal.        Behavior: Behavior normal.    LABORATORY DATA:  I have reviewed the data as listed Lab Results  Component Value Date   WBC 142.3 (HH) 10/27/2024   HGB 9.7 (L) 10/27/2024   HCT 33.3 (L) 10/27/2024   MCV 108.5 (H) 10/27/2024   PLT 148 (L) 10/27/2024   Recent Labs    06/11/24 2234 06/16/24 1009 09/30/24 1046 10/11/24 1210 10/13/24 0901 10/27/24 0934  NA 139   < > 138 138 138 134*  K 4.5   < > 4.6 4.4 3.9 4.0  CL 106   < > 106 101 104 100  CO2 22   < > 23 24 23 23   GLUCOSE 105*   < > 123* 115* 167* 190*  BUN 74*   < > 53* 51* 52* 62*  CREATININE 1.91*   < > 1.66* 1.46* 1.51*  1.85*  CALCIUM  8.3*   < > 8.2* 8.2* 8.3* 8.2*  GFRNONAA 25*   < > 29* 34* 33* 26*  PROT 6.4*  --  6.2* 6.3*  --   --   ALBUMIN 4.1  --  4.0 3.7  --   --   AST 19  --  19 19  --   --   ALT 10  --  28 23  --   --   ALKPHOS 109  --  82 67  --   --   BILITOT 0.9  --  0.8 0.9  --   --    < > = values in this interval not displayed.    RADIOGRAPHIC STUDIES: I have personally reviewed the radiological images as listed and agreed with the findings in the report. US  Venous Img Upper Uni Right(DVT) Result Date: 10/11/2024 CLINICAL DATA:  History of chronic lymphocytic leukemia presenting with right arm swelling. EXAM: RIGHT UPPER EXTREMITY VENOUS DOPPLER ULTRASOUND TECHNIQUE: Gray-scale sonography with graded compression, as well as color Doppler and duplex ultrasound were performed to evaluate the upper extremity deep venous system from the level of the subclavian vein and including the jugular, axillary, basilic, radial, ulnar and upper cephalic vein. Spectral Doppler was utilized to evaluate flow at rest and with distal augmentation maneuvers. COMPARISON:  None Available. FINDINGS: Contralateral Subclavian Vein: Respiratory phasicity is normal and symmetric with the symptomatic side. No evidence of thrombus. Normal compressibility. Internal Jugular Vein:  No evidence of thrombus. Normal compressibility, respiratory phasicity and response to augmentation. Subclavian Vein: No evidence of thrombus. Normal compressibility, respiratory phasicity and response to augmentation. Axillary Vein: No evidence of thrombus. Normal compressibility, respiratory phasicity and response to augmentation. Cephalic Vein: No evidence of thrombus. Normal compressibility, respiratory phasicity and response to augmentation. Basilic Vein: No evidence of thrombus. Normal compressibility, respiratory phasicity and response to augmentation. Brachial Veins: No evidence of thrombus. Normal compressibility, respiratory phasicity and response  to augmentation. Radial Veins: No evidence of thrombus. Normal compressibility, respiratory phasicity and response to augmentation. Ulnar Veins: No evidence of thrombus. Normal compressibility, respiratory phasicity and response to augmentation. Venous Reflux:  None visualized. Other Findings: Enlarged lymph nodes are seen within the neck on the right (ultrasonographic measurements not provided). IMPRESSION: 1. No evidence of DVT within the RIGHT upper extremity. 2. Enlarged right neck lymph nodes which may represent sequelae associated with the patient's history of chronic lymphocytic leukemia. Electronically Signed   By: Suzen Dials M.D.   On: 10/11/2024 16:05   DG Forearm Right Result Date: 10/11/2024 EXAM: 2 VIEW(S) XRAY OF THE RIGHT FOREARM 10/11/2024 12:36:00 PM COMPARISON: None available. CLINICAL HISTORY: swelling. Table formatting from the original note was not included.; Pt fell on 10/15 and missed the commode. Daughter picked her up between the commode and shower. Pt had told daughter she was dizzy when she fell. Right lower arm swelling and skin tears to upper arm. Pt on abx for possible PNA and no more cough. FINDINGS: BONES AND JOINTS: No acute fracture. No focal osseous lesion. No joint dislocation. Mild degenerative changes of the elbow. SOFT TISSUES: Mild soft tissue edema. IMPRESSION: 1. No acute fracture or dislocation. 2. Mild soft tissue edema. Electronically signed by: Waddell Calk MD 10/11/2024 12:54 PM EDT RP Workstation: [id number]   DG Chest 2 View Result Date: 09/30/2024 CLINICAL DATA:  cough, shortness of breath EXAM: CHEST - 2 VIEW COMPARISON:  June 11, 2024 FINDINGS: Flattening of the diaphragms with coarsening of the pulmonary interstitium, possibly reflecting changes of emphysema. Nodular airspace opacity in the right infrahilar region on the frontal radiograph. No pneumothorax. Trace bilateral pleural effusion. No cardiomegaly. Aortic atherosclerosis. No acute fracture  or destructive lesions. Multilevel thoracic osteophytosis. IMPRESSION: Nodular airspace opacity in the right infrahilar region on the frontal radiograph, may represent a developing bronchopneumonia, in the correct clinical context. Alternatively, this could represent a lung nodule. A 2 view chest radiograph in 6-12 weeks is recommended to document resolution once treatment is complete. Electronically Signed   By: Rogelia Myers M.D.   On: 09/30/2024 12:47      ASSESSMENT & PLAN:    CLL (chronic lymphocytic leukemia) (HCC) # CLL: [56% OF NUCLEI POSITIVE FOR A 13Q DELETION]; April 2022-PET scan shows significant lymphadenopathy above and below diaphragm; splenomegaly. Discontinued Ibrutinib  [hypertensive emergencies] acalabrutinib  [leg cramps even with minimum dose].Discontinued zanubrutinib . hypokalemia loss and renal failure/extreme fatigue etc.     # Progression of disease given the rising white count- progressively getting worse. But declines any therapies.  Recommend palliative prednisone  for appetite.  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  #OCT 2024- I sat- 11%- on  gentle iron [iron biglycinate; 28 mg ] 1 pill a day. HOLD retacrit  today-given patient's preference/lack of improvement. Today Hb- 9.7 - HOLD Blood tomorrow.    # Cardiac -history of CHF / elevated blood pressure- On lasix - with PRBC infusion- defr K to PCP. Increased peripheral edema  today bilateral lower ext slightly worse on LLE.  Attempting to order lift chair to make it easier for her to elevate her lower ext today.  Temp increase lasix  by 20 mg daily x 3 days, if no improvement in edema defer to cardiology patient and family in agreement with plan.  Prednisone  could also contribute to edema as well.   # CKD stage III-IV  Underlying diuretic/CHF/possible tumor lysis- stable. Creatinine at 1.85 continue to monitor      #  s/p pallaitive care-as per patient wishes continue  follow-up palliative care.  Continue supportive care.  Discussed with Dr. Sherleen.  # lower back pain- worse on left lower back, she has had this in past.  No symptoms of sciatica, no saddle paraesthesia.  Discussed getting patient on a routine regimen with pain medication for better pain control.  Also with history of UTI will check ua c&s here in clinic to make sure this is not associated with an acute infection.  If back pain persist could consider x-ray however not likely a candidate for any other intervention will manage conservatively at this time.   # ACP- DNR    # DISPOSITION: # cancel blood tomorrow # in 2 weeks see MD- labs- cbc/HOLD tube; D-2 possible 1 unit PRBC blood; lasix  20 mg IV post transfusion # follow up in 4th week-  APP; labs- cbc/ bmp; LDH; HOLD tube; D-2- possible 1 unit PRBC-lasix  20 mg IV post transfusion- LP     All questions were answered. The patient knows to call the clinic with any problems, questions or concerns.    Morna Husband AGNP-C 10/27/2024   All questions were answered. The patient knows to call the clinic with any problems, questions or concerns.

## 2024-10-27 NOTE — Progress Notes (Signed)
 1044am rn Spoke with Luke - in cancer center lab- Critical Wbc 142.3 reported. Read back process performed with lab tech. 1048 am Morna, NP made aware of critical value. Read back process performed with APP.

## 2024-10-28 ENCOUNTER — Inpatient Hospital Stay

## 2024-10-28 ENCOUNTER — Other Ambulatory Visit: Payer: Self-pay | Admitting: Hospice and Palliative Medicine

## 2024-10-28 ENCOUNTER — Encounter: Payer: Self-pay | Admitting: Hospice and Palliative Medicine

## 2024-10-28 DIAGNOSIS — M545 Low back pain, unspecified: Secondary | ICD-10-CM

## 2024-10-28 LAB — URINE CULTURE: Culture: NO GROWTH

## 2024-10-28 NOTE — Progress Notes (Signed)
 I spoke with patient's daughter.  Reportedly, she is having left lower extremity edema and pain in the legs.  Also having low back pain.  Daughter has been taking patient to the chiropractor without any improvement.  Reportedly, there was a recent fall.  As result, patient has been taking more Percocet (2 tablets) every 4 hours consistently.  Will order lower extremity Dopplers and DG lumbar spine for evaluation of pain/edema.

## 2024-10-29 ENCOUNTER — Ambulatory Visit
Admission: RE | Admit: 2024-10-29 | Discharge: 2024-10-29 | Disposition: A | Discharge status: Home / Self Care | Source: Ambulatory Visit | Attending: Hospice and Palliative Medicine | Admitting: Hospice and Palliative Medicine

## 2024-10-29 DIAGNOSIS — M545 Low back pain, unspecified: Secondary | ICD-10-CM | POA: Diagnosis present

## 2024-10-30 ENCOUNTER — Ambulatory Visit
Admission: RE | Admit: 2024-10-30 | Discharge: 2024-10-30 | Disposition: A | Discharge status: Home / Self Care | Attending: Hospice and Palliative Medicine | Admitting: Hospice and Palliative Medicine

## 2024-10-30 ENCOUNTER — Telehealth: Payer: Self-pay

## 2024-10-30 ENCOUNTER — Telehealth: Payer: Self-pay | Admitting: Hospice and Palliative Medicine

## 2024-10-30 ENCOUNTER — Ambulatory Visit
Admission: RE | Admit: 2024-10-30 | Discharge: 2024-10-30 | Disposition: A | Discharge status: Home / Self Care | Source: Ambulatory Visit | Attending: Hospice and Palliative Medicine | Admitting: Hospice and Palliative Medicine

## 2024-10-30 DIAGNOSIS — M545 Low back pain, unspecified: Secondary | ICD-10-CM | POA: Insufficient documentation

## 2024-10-30 MED ORDER — NALOXONE HCL 4 MG/0.1ML NA LIQD: NASAL | 0 refills | Status: DC

## 2024-10-30 MED ORDER — XTAMPZA ER 9 MG PO C12A: 1.0000 | EXTENDED_RELEASE_CAPSULE | Freq: Two times a day (BID) | ORAL | 0 refills | Status: DC

## 2024-10-30 NOTE — Telephone Encounter (Signed)
 I spoke with patient's daughter.  Reviewed results of Doppler and x-rays.  No DVT or fracture.  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  Suspect that that is the cause of her pain.  Daughter is giving patient Percocet 2 tablets every 4 hours around-the-clock.  Will start her on Xtampza  ER 9 mg every 12 hours.  Patient can continue Percocet as needed for breakthrough pain.  Discussed bowel regimen to prevent opioid-induced constipation.  Also discussed safe storage and administration of pain medications.  PDMP reviewed.  Naloxone prescribed.

## 2024-10-30 NOTE — Telephone Encounter (Signed)
 Patient's daughter, Tracie, calling for xray results from yesterday.  Upon review of the chart it seems the LE US  was performed yesterday but the xray was not.    Call returned to Tracie informing her xray was not done yesterday, could she take Ms. Molyneux today for walk in xray.  Tracie agrees to take her this morning for xray.

## 2024-10-30 NOTE — Telephone Encounter (Signed)
Results are available.

## 2024-11-07 ENCOUNTER — Other Ambulatory Visit: Payer: Self-pay | Admitting: Family

## 2024-11-07 ENCOUNTER — Other Ambulatory Visit: Payer: Self-pay | Admitting: Internal Medicine

## 2024-11-07 ENCOUNTER — Telehealth: Payer: Self-pay

## 2024-11-07 NOTE — Telephone Encounter (Signed)
 Returned call to Tracie to reiterate pain medication dosing/regimen.  Confirmed and explained the prescribed dosing directions for the Oxycodone .  Tracie understands and appreciates the confirmation of pain med regimen.

## 2024-11-07 NOTE — Telephone Encounter (Signed)
 Lindsay [name] calling to update Lindsay [name] on patient's pain.  She has been taking Xtampza  at 10 am and 10 pm which has been controlling her pain.  But the low back pain continues to bother her and asked for Oxycodone  this morning and Lindsay [name] gave her 1/2 tab at 9:09.    Informed Lindsay [name] that the Oxycodone  is prescribed for 1-2 Q4H prn severe pain.  She can go ahead and give her the other 1/2 of oxycodone  now and I would like for Lindsay [name] to confirm what would be safe pain med regimen for Lindsay [name].

## 2024-11-10 ENCOUNTER — Inpatient Hospital Stay

## 2024-11-10 ENCOUNTER — Telehealth: Payer: Self-pay | Admitting: *Deleted

## 2024-11-10 ENCOUNTER — Inpatient Hospital Stay: Admitting: Hospice and Palliative Medicine

## 2024-11-10 ENCOUNTER — Encounter: Payer: Self-pay | Admitting: Hospice and Palliative Medicine

## 2024-11-10 ENCOUNTER — Inpatient Hospital Stay: Admitting: Nurse Practitioner

## 2024-11-10 VITALS — BP 130/54 | HR 89 | Temp 97.8°F | Resp 18 | Ht 63.0 in | Wt 97.1 lb

## 2024-11-10 DIAGNOSIS — C9110 Chronic lymphocytic leukemia of B-cell type not having achieved remission: Secondary | ICD-10-CM | POA: Diagnosis not present

## 2024-11-10 LAB — CBC WITH DIFFERENTIAL (CANCER CENTER ONLY)
Abs Immature Granulocytes: 0.99 K/uL — ABNORMAL HIGH (ref 0.00–0.07)
Basophils Absolute: 0 K/uL (ref 0.0–0.1)
Basophils Relative: 0 %
Eosinophils Absolute: 0.6 K/uL — ABNORMAL HIGH (ref 0.0–0.5)
Eosinophils Relative: 0 %
HCT: 34.7 % — ABNORMAL LOW (ref 36.0–46.0)
Hemoglobin: 9.9 g/dL — ABNORMAL LOW (ref 12.0–15.0)
Immature Granulocytes: 1 %
Lymphocytes Relative: 94 %
Lymphs Abs: 185.8 K/uL — ABNORMAL HIGH (ref 0.7–4.0)
MCH: 30.3 pg (ref 26.0–34.0)
MCHC: 28.5 g/dL — ABNORMAL LOW (ref 30.0–36.0)
MCV: 106.1 fL — ABNORMAL HIGH (ref 80.0–100.0)
Monocytes Absolute: 1.8 K/uL — ABNORMAL HIGH (ref 0.1–1.0)
Monocytes Relative: 1 %
Neutro Abs: 7 K/uL (ref 1.7–7.7)
Neutrophils Relative %: 4 %
Platelet Count: 166 K/uL (ref 150–400)
RBC: 3.27 MIL/uL — ABNORMAL LOW (ref 3.87–5.11)
RDW: 15.2 % (ref 11.5–15.5)
Smear Review: NORMAL
WBC Count: 196.2 K/uL (ref 4.0–10.5)
nRBC: 0 % (ref 0.0–0.2)

## 2024-11-10 LAB — SAMPLE TO BLOOD BANK

## 2024-11-10 MED ORDER — AMOXICILLIN 500 MG PO TABS: 500.0000 mg | ORAL_TABLET | Freq: Two times a day (BID) | ORAL | 0 refills | Status: DC

## 2024-11-10 NOTE — Progress Notes (Signed)
 C/o back/hip pain and constipation, using miralax . Having a BM every day but very stiff. C/o tiredness and weakness.  Voice is hoarse x1 week. Slight cough and congestion. No fevers.

## 2024-11-10 NOTE — Progress Notes (Addendum)
 Symptom Management Clinic [facility] [phone] [phone]  Patient Care Team: Valora Lynwood FALCON, MD as PCP - General (Family Medicine) Rennie Cindy SAUNDERS, MD as Consulting Physician (Oncology)   NAME OF PATIENT: Lindsay [name]  [id number]  08-28-1934   DATE OF VISIT: 11/10/24  REASON FOR CONSULT: DONNAJEAN CHESNUT is a 89 y.o. female with multiple medical problems including CLL with progression of disease/rising leukocytosis.  Patient was taken off treatment due to poor tolerance.  Hospice has been previously discussed but patient opted to continue transfusions.  INTERVAL HISTORY: Patient presents to [facility] today for labs and consideration of blood transfusion.  Patient feels she is doing reasonably well.  Vocal hoarseness, sore throat, dry cough, feels phlegm in chest.  Denies fever or chills.  Denies shortness of breath or chest pain.  Has several other sick contacts in family.  No nausea, vomiting, diarrhea.  Has constipation.  No reported urinary symptoms.  Appetite has been poor/selective.    Patient offers no further specific complaints today.   PAST MEDICAL HISTORY: Past Medical History:  Diagnosis Date   Arthritis    RHEUMATOID   CHF (congestive heart failure) (HCC)    CLL (chronic lymphocytic leukemia) (HCC)    Edema    MILD ANKLE OCCAS   Fibromyalgia    GERD (gastroesophageal reflux disease)    Hypertension    Kidney stones    RLS (restless legs syndrome)     PAST SURGICAL HISTORY:  Past Surgical History:  Procedure Laterality Date   ABDOMINAL HYSTERECTOMY     CATARACT EXTRACTION W/PHACO Left 02/07/2016   Procedure: CATARACT EXTRACTION PHACO AND INTRAOCULAR LENS PLACEMENT (IOC);  Surgeon: Steven Dingeldein, MD;  Location: [facility] ORS;  Service: Ophthalmology;  Laterality: Left;  US  01:33 AP% 24.7 CDE 39.57 fluid pack lot # [id number]   EXTRACORPOREAL SHOCK WAVE LITHOTRIPSY Right 07/03/2019    Procedure: EXTRACORPOREAL SHOCK WAVE LITHOTRIPSY (ESWL);  Surgeon: Kassie Ozell SAUNDERS, MD;  Location: [facility] ORS;  Service: Urology;  Laterality: Right;   EYE SURGERY     HIP ARTHROPLASTY Left 04/20/2020   Procedure: ARTHROPLASTY BIPOLAR HIP (HEMIARTHROPLASTY);  Surgeon: Kathlynn Ozell, MD;  Location: [facility] ORS;  Service: Orthopedics;  Laterality: Left;   LITHOTRIPSY     TONSILLECTOMY      HEMATOLOGY/ONCOLOGY HISTORY:  Oncology History Overview Note  # LYMPHOCYTOSIS [25,K]/ANEMIA 9-10]; platelets-N; IgVH Somatic Hypermutation was not detected.56% OF NUCLEI POSITIVE FOR A 13Q DELETION; The phenotype is most consistent with a diagnosis of 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 Low level hypermetabolism consistent with CLL.2. Splenomegaly but no focal splenic lesions.  # April 2022- 13 Q DEL; IGVH- UNMUTATED  # MID April 2022- IBRUTINIB  280 mg x3 days [STOPPED sec ER/hypertensive emergency- 191/129]  # MAY 26th, 2022- start acalabrutinib  100 mg once a day [reduced dose]; intermittent; discontinued  September mid 2022. [Muscle cramps.]  # MAY 1st week- 2024- Zanubrutinib  80 mg; stopped after 1-2 weeks- sec to acute renal failure hyperkalemia/extreme fatigue etc.  Patient declined further therapies.       CLL (chronic lymphocytic leukemia) (HCC)  02/14/2021 Initial Diagnosis   CLL (chronic lymphocytic leukemia) (HCC)   04/07/2021 Cancer Staging   Staging form: Chronic Lymphocytic Leukemia / Small Lymphocytic Lymphoma, AJCC 8th Edition - Clinical: Modified Rai Stage III (Modified Rai risk: High, Binet: Stage C) - Signed by Brahmanday, Govinda R, MD  on 04/07/2021 Stage prefix: Initial diagnosis   12/16/2021 - 12/16/2021 Chemotherapy   Patient is on Treatment Plan : CLL - Rituximab q 4 weeks       ALLERGIES:  is allergic to gabapentin and demerol  [meperidine].  MEDICATIONS:  Current Outpatient Medications  Medication Sig Dispense Refill   amitriptyline  (ELAVIL ) 10 MG tablet Take 20 mg by mouth at bedtime.     cetirizine (ZYRTEC) 5 MG tablet Take 5 mg by mouth.     cyanocobalamin  (,VITAMIN B-12,) 1000 MCG/ML injection Inject into the muscle every 14 (fourteen) days. Next injection 4/16     cyanocobalamin  (VITAMIN B12) 1000 MCG tablet Take 1,000 mcg by mouth daily.     DULoxetine  (CYMBALTA ) 20 MG capsule Take 20 mg by mouth daily.     Ferrous Sulfate 28 MG TABS Take 28 mg by mouth daily.     furosemide  (LASIX ) 20 MG tablet TAKE TWO TABLETS (40 MG TOTAL) BY MOUTH DAILY. 60 tablet 5   MELATONIN GUMMIES PO Take 3 mg by mouth at bedtime as needed.     mometasone (NASONEX) 50 MCG/ACT nasal spray Place 2 sprays into the nose daily.     naloxone  (NARCAN ) nasal spray 4 mg/0.1 mL SPRAY 1 SPRAY INTO ONE NOSTRIL AS DIRECTED FOR OPIOID OVERDOSE (TURN PERSON ON SIDE AFTER DOSE. IF NO RESPONSE IN 2-3 MINUTES OR PERSON RESPONDS BUT RELAPSES, REPEAT USING A NEW SPRAY DEVICE AND SPRAY INTO THE OTHER NOSTRIL. CALL 911 AFTER USE.) * EMERGENCY USE ONLY * 1 each 0   ondansetron  (ZOFRAN ) 8 MG tablet TAKE ONE TABLET (8 MG TOTAL) BY MOUTH EVERY EIGHT HOURS AS NEEDED FOR NAUSEA OR VOMITING. 20 tablet 0   oxyCODONE  ER (XTAMPZA  ER) 9 MG C12A Take 1 capsule by mouth every 12 (twelve) hours. 30 capsule 0   oxyCODONE -acetaminophen  (PERCOCET/ROXICET) 5-325 MG tablet Take 1-2 tablets by mouth every 4 (four) hours as needed for severe pain (pain score 7-10). 90 tablet 0   pantoprazole (PROTONIX) 40 MG tablet Take 40 mg by mouth daily.     polyethylene glycol powder (GLYCOLAX /MIRALAX ) 17 GM/SCOOP powder Take 17 g by mouth 2 (two) times daily as needed for moderate constipation. 255 g 0   predniSONE  (DELTASONE ) 20 MG tablet TAKE 1 TABLET (20 MG TOTAL) BY MOUTH DAILY WITH BREAKFAST. 30 tablet 1   No current facility-administered medications for this visit.    VITAL  SIGNS: BP (!) 130/54 (BP Location: Right Arm, Patient Position: Sitting, Cuff Size: Small) Comment: pt advised bp elevated, keep check at home, contact pcp if con't to stay elevated  Pulse 89   Temp 97.8 F (36.6 C) (Tympanic)   Resp 18   Ht 5' 3 (1.6 m)   Wt 97 lb 1.6 oz (44 kg)   SpO2 97%   BMI 17.20 kg/m  Filed Weights   11/10/24 1035  Weight: 97 lb 1.6 oz (44 kg)    Estimated body mass index is 17.2 kg/m as calculated from the following:   Height as of this encounter: 5' 3 (1.6 m).   Weight as of this encounter: 97 lb 1.6 oz (44 kg).  LABS: CBC:    Component Value Date/Time   WBC 196.2 (HH) 11/10/2024 1023   WBC 201.5 (HH) 10/11/2024 1210   HGB 9.9 (L) 11/10/2024 1023   HGB 14.1 03/06/2014 2049   HCT 34.7 (L) 11/10/2024 1023   HCT 42.9 03/06/2014 2049   PLT 166 11/10/2024 1023   PLT 240 03/06/2014 2049  MCV 106.1 (H) 11/10/2024 1023   MCV 88 03/06/2014 2049   NEUTROABS PENDING 11/10/2024 1023   NEUTROABS 7.3 (H) 03/06/2014 2049   LYMPHSABS PENDING 11/10/2024 1023   LYMPHSABS 6.6 (H) 03/06/2014 2049   MONOABS PENDING 11/10/2024 1023   MONOABS 1.3 (H) 03/06/2014 2049   EOSABS PENDING 11/10/2024 1023   EOSABS 0.4 03/06/2014 2049   BASOSABS PENDING 11/10/2024 1023   BASOSABS 0.1 03/06/2014 2049   Comprehensive Metabolic Panel:    Component Value Date/Time   NA 134 (L) 10/27/2024 0934   NA 138 09/04/2023 1154   NA 138 03/06/2014 2049   K 4.0 10/27/2024 0934   K 3.6 03/06/2014 2049   CL 100 10/27/2024 0934   CL 107 03/06/2014 2049   CO2 23 10/27/2024 0934   CO2 29 03/06/2014 2049   BUN 62 (H) 10/27/2024 0934   BUN 58 (H) 09/04/2023 1154   BUN 14 03/06/2014 2049   CREATININE 1.85 (H) 10/27/2024 0934   CREATININE 0.75 03/06/2014 2049   GLUCOSE 190 (H) 10/27/2024 0934   GLUCOSE 103 (H) 03/06/2014 2049   CALCIUM  8.2 (L) 10/27/2024 0934   CALCIUM  8.9 03/06/2014 2049   AST 19 10/11/2024 1210   AST 19 09/30/2024 1046   ALT 23 10/11/2024 1210   ALT 28  09/30/2024 1046   ALKPHOS 67 10/11/2024 1210   BILITOT 0.9 10/11/2024 1210   BILITOT 0.8 09/30/2024 1046   PROT 6.3 (L) 10/11/2024 1210   ALBUMIN 3.7 10/11/2024 1210    RADIOGRAPHIC STUDIES: DG Pelvis 1-2 Views Result Date: 10/30/2024 EXAM: 1 or 2 VIEW(S) XRAY OF THE PELVIS 10/30/2024 12:06:18 PM COMPARISON: None available. CLINICAL HISTORY: hip/back pain FINDINGS: BONES AND JOINTS: Status post left hip arthroplasty. No acute fracture or dislocation is noted. No focal osseous lesion. SOFT TISSUES: The soft tissues are unremarkable. IMPRESSION: 1. Status post left hip arthroplasty. 2. No acute fracture or dislocation. Electronically signed by: Lynwood Seip MD 10/30/2024 12:45 PM EST RP Workstation: [id number]   DG Lumbar Spine Complete Result Date: 10/30/2024 EXAM: 4 VIEW(S) XRAY OF THE LUMBAR SPINE 10/30/2024 12:06:18 PM COMPARISON: 04/12/2024 CLINICAL HISTORY: low back pain FINDINGS: LUMBAR SPINE: BONES: Old T12 fracture is noted. No definite acute fracture. No aggressive appearing osseous lesion. Alignment shows mild levoscoliosis of the lumbar spine. DISCS AND DEGENERATIVE CHANGES: Severe degenerative disc disease is noted at L2-3 and L3-4. Mild grade 1 anterolisthesis of L4-5 is noted secondary to posterior facet joint hypertrophy. SOFT TISSUES: No acute abnormality. IMPRESSION: 1. No acute abnormality of the lumbar spine. 2. Severe degenerative disc disease at L2-3 and L3-4. 3. Mild grade 1 anterolisthesis of L4-5, likely secondary to posterior facet arthropathy. 4. Mild levoscoliosis of the lumbar spine. Electronically signed by: Lynwood Seip MD 10/30/2024 12:43 PM EST RP Workstation: [id number]   US  Venous Img Lower Bilateral Result Date: 10/29/2024 CLINICAL DATA:  Lower extremity edema and pain. EXAM: Bilateral Lower Extremity Venous Doppler Ultrasound TECHNIQUE: Gray-scale sonography with compression, as well as color and duplex ultrasound, were performed to evaluate the deep venous  system(s) from the level of the common femoral vein through the popliteal and proximal calf veins. COMPARISON:  07/17/2022 FINDINGS: VENOUS Normal compressibility of the common femoral, superficial femoral, and popliteal veins, as well as the visualized calf veins. Visualized portions of profunda femoral vein and great saphenous vein unremarkable. No filling defects to suggest DVT on grayscale or color Doppler imaging. Doppler waveforms show normal direction of venous flow, normal respiratory plasticity and response to augmentation. OTHER  Subcutaneous edema noted in the LEFT calf. Limitations: none IMPRESSION: No lower extremity DVT. Electronically Signed   By: Aliene Lloyd M.D.   On: 10/29/2024 10:08   US  Venous Img Upper Uni Right(DVT) Result Date: 10/11/2024 CLINICAL DATA:  History of chronic lymphocytic leukemia presenting with right arm swelling. EXAM: RIGHT UPPER EXTREMITY VENOUS DOPPLER ULTRASOUND TECHNIQUE: Gray-scale sonography with graded compression, as well as color Doppler and duplex ultrasound were performed to evaluate the upper extremity deep venous system from the level of the subclavian vein and including the jugular, axillary, basilic, radial, ulnar and upper cephalic vein. Spectral Doppler was utilized to evaluate flow at rest and with distal augmentation maneuvers. COMPARISON:  None Available. FINDINGS: Contralateral Subclavian Vein: Respiratory phasicity is normal and symmetric with the symptomatic side. No evidence of thrombus. Normal compressibility. Internal Jugular Vein: No evidence of thrombus. Normal compressibility, respiratory phasicity and response to augmentation. Subclavian Vein: No evidence of thrombus. Normal compressibility, respiratory phasicity and response to augmentation. Axillary Vein: No evidence of thrombus. Normal compressibility, respiratory phasicity and response to augmentation. Cephalic Vein: No evidence of thrombus. Normal compressibility, respiratory phasicity  and response to augmentation. Basilic Vein: No evidence of thrombus. Normal compressibility, respiratory phasicity and response to augmentation. Brachial Veins: No evidence of thrombus. Normal compressibility, respiratory phasicity and response to augmentation. Radial Veins: No evidence of thrombus. Normal compressibility, respiratory phasicity and response to augmentation. Ulnar Veins: No evidence of thrombus. Normal compressibility, respiratory phasicity and response to augmentation. Venous Reflux:  None visualized. Other Findings: Enlarged lymph nodes are seen within the neck on the right (ultrasonographic measurements not provided). IMPRESSION: 1. No evidence of DVT within the RIGHT upper extremity. 2. Enlarged right neck lymph nodes which may represent sequelae associated with the patient's history of chronic lymphocytic leukemia. Electronically Signed   By: Suzen Dials M.D.   On: 10/11/2024 16:05   DG Forearm Right Result Date: 10/11/2024 EXAM: 2 VIEW(S) XRAY OF THE RIGHT FOREARM 10/11/2024 12:36:00 PM COMPARISON: None available. CLINICAL HISTORY: swelling. Table formatting from the original note was not included.; Pt fell on 10/15 and missed the commode. Daughter picked her up between the commode and shower. Pt had told daughter she was dizzy when she fell. Right lower arm swelling and skin tears to upper arm. Pt on abx for possible PNA and no more cough. FINDINGS: BONES AND JOINTS: No acute fracture. No focal osseous lesion. No joint dislocation. Mild degenerative changes of the elbow. SOFT TISSUES: Mild soft tissue edema. IMPRESSION: 1. No acute fracture or dislocation. 2. Mild soft tissue edema. Electronically signed by: Waddell Calk MD 10/11/2024 12:54 PM EDT RP Workstation: [id number]    PERFORMANCE STATUS (ECOG) : 1 - Symptomatic but completely ambulatory  Review of Systems Unless otherwise noted, a complete review of systems is negative.  Physical Exam General: NAD HEENT: No  lymphadenopathy, OP clear without exudate Cardiovascular: regular rate and rhythm Pulmonary: clear anterior/posterior fields Abdomen: soft, nontender, + bowel sounds GU: no suprapubic tenderness Extremities: Trace bilateral lower extremity edema, no joint deformities, soles red/dry appearance, blanchable  Skin: no rashes Neurological: Weakness but otherwise nonfocal  IMPRESSION/PLAN: CLL -off treatment due to poor tolerance.  Worsening leukocytosis.  WBC 196 today.  Anemia -hemoglobin stable.  No need for transfusion today.  URI -likely viral.  However, patient high risk for infection.  Will start empirically on amoxicillin  500 mg every 12 hours x 5 days.  Hip/back pain -lumbar x-ray showed severe degenerative disc disease at L2-L3 and L3-L4.  Pain  is stable on Xtampza  ER with use of as needed Percocet.  Patient not interested in orthopedic referral at this time.  Constipation -taking daily MiraLAX .  Add senna for ongoing constipation.  MD follow-up 2 weeks  Patient expressed understanding and was in agreement with this plan. She also understands that She can call clinic at any time with any questions, concerns, or complaints.   Thank you for allowing me to participate in the care of this very pleasant patient.   Time Total: 20 minutes  Visit consisted of counseling and education dealing with the complex and emotionally intense issues of symptom management in the setting of serious illness.Greater than 50%  of this time was spent counseling and coordinating care related to the above assessment and plan.  Signed by: Fonda Mower, PhD, NP-C

## 2024-11-10 NOTE — Telephone Encounter (Signed)
 11/10/2024 at 1051-RN spoke with [facility] in cancer [facility] lab. Critical wbc reported at 196.2. read back process performed with lab tech. 1052 am 11/10/2024- RN provided critical report to Sidra Mower, NP who is seeing patient. Read back process performed with APP.

## 2024-11-11 ENCOUNTER — Inpatient Hospital Stay

## 2024-11-17 ENCOUNTER — Other Ambulatory Visit: Payer: Self-pay | Admitting: Hospice and Palliative Medicine

## 2024-11-24 ENCOUNTER — Encounter: Payer: Self-pay | Admitting: Internal Medicine

## 2024-11-24 ENCOUNTER — Other Ambulatory Visit: Payer: Self-pay | Admitting: Hospice and Palliative Medicine

## 2024-11-24 DIAGNOSIS — C9110 Chronic lymphocytic leukemia of B-cell type not having achieved remission: Secondary | ICD-10-CM

## 2024-11-24 NOTE — Progress Notes (Signed)
 Daughter called to guide intractable pain.  Recommend Percocet 1 to 2 pills every 4-6 hours.  Hospice evaluation pending tomorrow.

## 2024-11-24 NOTE — Progress Notes (Signed)
 I spoke with patient's daughter.  Patient is declining.  Daughter is not interested in further workups or treatments.  Instead she wants to keep patient home and comfortable until end-of-life.  She is in agreement with hospice involvement.  Hospice order placed.

## 2024-11-25 ENCOUNTER — Other Ambulatory Visit: Payer: Self-pay

## 2024-11-25 ENCOUNTER — Encounter: Payer: Self-pay | Admitting: Emergency Medicine

## 2024-11-25 ENCOUNTER — Emergency Department

## 2024-11-25 ENCOUNTER — Inpatient Hospital Stay: Admitting: Internal Medicine

## 2024-11-25 ENCOUNTER — Observation Stay
Admission: EM | Admit: 2024-11-25 | Discharge: 2024-11-25 | Disposition: A | Discharge status: Hospice | Attending: Emergency Medicine | Admitting: Emergency Medicine

## 2024-11-25 ENCOUNTER — Inpatient Hospital Stay

## 2024-11-25 ENCOUNTER — Telehealth: Payer: Self-pay | Admitting: Internal Medicine

## 2024-11-25 DIAGNOSIS — W19XXXD Unspecified fall, subsequent encounter: Secondary | ICD-10-CM

## 2024-11-25 DIAGNOSIS — Z515 Encounter for palliative care: Secondary | ICD-10-CM

## 2024-11-25 DIAGNOSIS — G8929 Other chronic pain: Secondary | ICD-10-CM

## 2024-11-25 DIAGNOSIS — N179 Acute kidney failure, unspecified: Secondary | ICD-10-CM | POA: Insufficient documentation

## 2024-11-25 DIAGNOSIS — I5023 Acute on chronic systolic (congestive) heart failure: Secondary | ICD-10-CM | POA: Diagnosis present

## 2024-11-25 DIAGNOSIS — L8990 Pressure ulcer of unspecified site, unspecified stage: Secondary | ICD-10-CM | POA: Insufficient documentation

## 2024-11-25 DIAGNOSIS — C9110 Chronic lymphocytic leukemia of B-cell type not having achieved remission: Secondary | ICD-10-CM | POA: Diagnosis present

## 2024-11-25 DIAGNOSIS — R109 Unspecified abdominal pain: Secondary | ICD-10-CM

## 2024-11-25 DIAGNOSIS — I509 Heart failure, unspecified: Secondary | ICD-10-CM

## 2024-11-25 DIAGNOSIS — J9601 Acute respiratory failure with hypoxia: Principal | ICD-10-CM

## 2024-11-25 LAB — COMPREHENSIVE METABOLIC PANEL WITH GFR
ALT: 29 U/L (ref 0–44)
AST: 31 U/L (ref 15–41)
Albumin: 4.1 g/dL (ref 3.5–5.0)
Alkaline Phosphatase: 167 U/L — ABNORMAL HIGH (ref 38–126)
Anion gap: 17 — ABNORMAL HIGH (ref 5–15)
BUN: 72 mg/dL — ABNORMAL HIGH (ref 8–23)
CO2: 23 mmol/L (ref 22–32)
Calcium: 8.4 mg/dL — ABNORMAL LOW (ref 8.9–10.3)
Chloride: 95 mmol/L — ABNORMAL LOW (ref 98–111)
Creatinine, Ser: 2.05 mg/dL — ABNORMAL HIGH (ref 0.44–1.00)
GFR, Estimated: 23 mL/min — ABNORMAL LOW (ref >60)
Glucose, Bld: 125 mg/dL — ABNORMAL HIGH (ref 70–99)
Potassium: 4.7 mmol/L (ref 3.5–5.1)
Sodium: 136 mmol/L (ref 135–145)
Total Bilirubin: 0.9 mg/dL (ref 0.0–1.2)
Total Protein: 5.8 g/dL — ABNORMAL LOW (ref 6.5–8.1)

## 2024-11-25 LAB — CBC WITH DIFFERENTIAL/PLATELET
Abs Immature Granulocytes: 0.7 K/uL — ABNORMAL HIGH (ref 0.00–0.07)
Basophils Absolute: 0 K/uL (ref 0.0–0.1)
Basophils Relative: 0 %
Eosinophils Absolute: 0.1 K/uL (ref 0.0–0.5)
Eosinophils Relative: 0 %
HCT: 33 % — ABNORMAL LOW (ref 36.0–46.0)
Hemoglobin: 9.5 g/dL — ABNORMAL LOW (ref 12.0–15.0)
Immature Granulocytes: 0 %
Lymphocytes Relative: 95 %
Lymphs Abs: 181.1 K/uL — ABNORMAL HIGH (ref 0.7–4.0)
MCH: 30.1 pg (ref 26.0–34.0)
MCHC: 28.8 g/dL — ABNORMAL LOW (ref 30.0–36.0)
MCV: 104.4 fL — ABNORMAL HIGH (ref 80.0–100.0)
Monocytes Absolute: 1 K/uL (ref 0.1–1.0)
Monocytes Relative: 1 %
Neutro Abs: 6.7 K/uL (ref 1.7–7.7)
Neutrophils Relative %: 4 %
Platelets: 158 K/uL (ref 150–400)
RBC: 3.16 MIL/uL — ABNORMAL LOW (ref 3.87–5.11)
RDW: 14.9 % (ref 11.5–15.5)
Smear Review: NORMAL
WBC: 189.6 K/uL (ref 4.0–10.5)
nRBC: 0 % (ref 0.0–0.2)

## 2024-11-25 LAB — PRO BRAIN NATRIURETIC PEPTIDE: Pro Brain Natriuretic Peptide: >35000 pg/mL — ABNORMAL HIGH (ref <300.0)

## 2024-11-25 LAB — TROPONIN T, HIGH SENSITIVITY: Troponin T High Sensitivity: 120 ng/L (ref 0–19)

## 2024-11-25 MED ORDER — MORPHINE SULFATE (PF) 4 MG/ML IV SOLN
4.0000 mg | Freq: Once | INTRAVENOUS | Status: AC
  Administered 2024-11-25: 4 mg via INTRAVENOUS
  Filled 2024-11-25: qty 1

## 2024-11-25 MED ORDER — ACETAMINOPHEN 325 MG RE SUPP: 650.0000 mg | Freq: Four times a day (QID) | RECTAL | Status: DC | PRN

## 2024-11-25 MED ORDER — ENOXAPARIN SODIUM 40 MG/0.4ML IJ SOSY: 40.0000 mg | PREFILLED_SYRINGE | INTRAMUSCULAR | Status: DC

## 2024-11-25 MED ORDER — ONDANSETRON HCL 4 MG PO TABS: 4.0000 mg | ORAL_TABLET | Freq: Four times a day (QID) | ORAL | Status: DC | PRN

## 2024-11-25 MED ORDER — DULOXETINE HCL 20 MG PO CPEP: 20.0000 mg | ORAL_CAPSULE | Freq: Every day | ORAL | Status: DC

## 2024-11-25 MED ORDER — LORAZEPAM 2 MG/ML IJ SOLN
0.5000 mg | INTRAMUSCULAR | Status: DC | PRN
  Administered 2024-11-25: 1 mg via INTRAVENOUS
  Administered 2024-11-25: 0.5 mg via INTRAVENOUS
  Filled 2024-11-25 (×2): qty 1

## 2024-11-25 MED ORDER — ACETAMINOPHEN 325 MG PO TABS: 650.0000 mg | ORAL_TABLET | Freq: Four times a day (QID) | ORAL | Status: DC | PRN

## 2024-11-25 MED ORDER — OXYCODONE-ACETAMINOPHEN 5-325 MG PO TABS: 1.0000 | ORAL_TABLET | ORAL | Status: DC | PRN

## 2024-11-25 MED ORDER — LORAZEPAM 2 MG/ML IJ SOLN: 0.5000 mg | INTRAMUSCULAR | Status: DC | PRN

## 2024-11-25 MED ORDER — DULOXETINE HCL 20 MG PO CPEP
20.0000 mg | ORAL_CAPSULE | Freq: Every day | ORAL | Status: DC
  Administered 2024-11-25: 20 mg via ORAL
  Filled 2024-11-25: qty 1

## 2024-11-25 MED ORDER — MORPHINE SULFATE (PF) 2 MG/ML IV SOLN: 2.0000 mg | INTRAVENOUS | Status: DC | PRN

## 2024-11-25 MED ORDER — HEPARIN SODIUM (PORCINE) 5000 UNIT/ML IJ SOLN
5000.0000 [IU] | Freq: Two times a day (BID) | INTRAMUSCULAR | Status: DC
  Administered 2024-11-25: 5000 [IU] via SUBCUTANEOUS
  Filled 2024-11-25: qty 1

## 2024-11-25 MED ORDER — MORPHINE SULFATE (PF) 2 MG/ML IV SOLN
2.0000 mg | INTRAVENOUS | Status: DC | PRN
  Administered 2024-11-25 (×3): 2 mg via INTRAVENOUS
  Filled 2024-11-25 (×2): qty 1

## 2024-11-25 MED ORDER — MORPHINE SULFATE (PF) 2 MG/ML IV SOLN
2.0000 mg | INTRAVENOUS | Status: DC | PRN
  Administered 2024-11-25: 2 mg via INTRAVENOUS
  Filled 2024-11-25 (×2): qty 1

## 2024-11-25 MED ORDER — ONDANSETRON HCL 4 MG/2ML IJ SOLN
4.0000 mg | Freq: Four times a day (QID) | INTRAMUSCULAR | Status: DC | PRN
  Administered 2024-11-25: 4 mg via INTRAVENOUS
  Filled 2024-11-25: qty 2

## 2024-11-25 MED ORDER — FUROSEMIDE 10 MG/ML IJ SOLN
20.0000 mg | Freq: Two times a day (BID) | INTRAMUSCULAR | Status: DC
  Administered 2024-11-25 (×2): 20 mg via INTRAVENOUS
  Filled 2024-11-25 (×2): qty 4

## 2024-11-25 MED ORDER — OXYCODONE HCL ER 10 MG PO T12A: 10.0000 mg | EXTENDED_RELEASE_TABLET | Freq: Two times a day (BID) | ORAL | Status: DC

## 2024-11-25 MED ORDER — FUROSEMIDE 10 MG/ML IJ SOLN
40.0000 mg | Freq: Once | INTRAMUSCULAR | Status: AC
  Administered 2024-11-25: 40 mg via INTRAVENOUS
  Filled 2024-11-25: qty 4

## 2024-11-25 MED ORDER — OXYCODONE HCL ER 10 MG PO T12A
10.0000 mg | EXTENDED_RELEASE_TABLET | Freq: Two times a day (BID) | ORAL | Status: DC
  Filled 2024-11-25: qty 1

## 2024-11-25 MED ORDER — CHLORHEXIDINE GLUCONATE CLOTH 2 % EX PADS
6.0000 | MEDICATED_PAD | Freq: Every day | CUTANEOUS | Status: DC
  Filled 2024-11-25: qty 6

## 2024-11-25 NOTE — Consult Note (Signed)
 Palliative Medicine [facility] at [facility] Telephone:([phone] [phone]) [phone]   Name: Lindsay [name] Date: 11/25/2024 MRN: [id number]  DOB: 1934/01/21  Patient Care Team: Valora Lynwood FALCON, MD as PCP - General (Family Medicine) Rennie Cindy SAUNDERS, MD as Consulting Physician (Oncology)    REASON FOR CONSULTATION: Lindsay [name] is a 89 y.o. female with multiple medical problems including CLL with progression of disease/rising leukocytosis. Patient was taken off treatment due to poor tolerance.  Patient with recent decline and was referred to hospice but had not yet started care.  She was admitted to hospital on 11/25/2024 with CHF and acute on chronic cancer related pain.  SOCIAL HISTORY:     reports that she has never smoked. She has never used smokeless tobacco. She reports that she does not drink alcohol and does not use drugs.  Patient lives at home with her daughter.  She also has a granddaughter who is involved.  ADVANCE DIRECTIVES:  Not on file  CODE STATUS: DNR/DNI (DNR order signed on 04/14/24)   PAST MEDICAL HISTORY: Past Medical History:  Diagnosis Date   Arthritis    RHEUMATOID   CHF (congestive heart failure) (HCC)    CLL (chronic lymphocytic leukemia) (HCC)    Edema    MILD ANKLE OCCAS   Fibromyalgia    GERD (gastroesophageal reflux disease)    Hypertension    Kidney stones    RLS (restless legs syndrome)     PAST SURGICAL HISTORY:  Past Surgical History:  Procedure Laterality Date   ABDOMINAL HYSTERECTOMY     CATARACT EXTRACTION W/PHACO Left 02/07/2016   Procedure: CATARACT EXTRACTION PHACO AND INTRAOCULAR LENS PLACEMENT (IOC);  Surgeon: Steven Dingeldein, MD;  Location: [facility] ORS;  Service: Ophthalmology;  Laterality: Left;  US  01:33 AP% 24.7 CDE 39.57 fluid pack lot # [id number]   EXTRACORPOREAL SHOCK WAVE LITHOTRIPSY Right 07/03/2019   Procedure: EXTRACORPOREAL SHOCK WAVE LITHOTRIPSY (ESWL);  Surgeon: Kassie Ozell SAUNDERS, MD;  Location: [facility] ORS;  Service: Urology;  Laterality: Right;   EYE SURGERY     HIP ARTHROPLASTY Left 04/20/2020   Procedure: ARTHROPLASTY BIPOLAR HIP (HEMIARTHROPLASTY);  Surgeon: Kathlynn Ozell, MD;  Location: [facility] ORS;  Service: Orthopedics;  Laterality: Left;   LITHOTRIPSY     TONSILLECTOMY      HEMATOLOGY/ONCOLOGY HISTORY:  Oncology History Overview Note  # LYMPHOCYTOSIS [25,K]/ANEMIA 9-10]; platelets-N; IgVH Somatic Hypermutation was not detected.56% OF NUCLEI POSITIVE FOR A 13Q DELETION; The phenotype is most consistent with a diagnosis of 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 Low level hypermetabolism consistent with CLL.2. Splenomegaly but no focal splenic lesions.  # April 2022- 13 Q DEL; IGVH- UNMUTATED  # MID April 2022- IBRUTINIB  280 mg x3 days [STOPPED sec ER/hypertensive emergency- 191/129]  # MAY 26th, 2022- start acalabrutinib  100 mg once a day [reduced dose]; intermittent; discontinued  September mid 2022. [Muscle cramps.]  # MAY 1st week- 2024- Zanubrutinib  80 mg; stopped after 1-2 weeks- sec to acute renal failure hyperkalemia/extreme fatigue etc.  Patient declined further therapies.       CLL (chronic lymphocytic leukemia) (HCC)  02/14/2021 Initial Diagnosis   CLL (chronic lymphocytic leukemia) (HCC)   04/07/2021 Cancer Staging   Staging form: Chronic Lymphocytic Leukemia / Small Lymphocytic Lymphoma, AJCC 8th Edition - Clinical: Modified Rai Stage III (Modified Rai risk: High, Binet: Stage C) - Signed by Brahmanday, Govinda R,  MD on 04/07/2021 Stage prefix: Initial diagnosis   12/16/2021 - 12/16/2021 Chemotherapy   Patient is on Treatment Plan : CLL - Rituximab q 4 weeks       ALLERGIES:  is allergic to gabapentin and demerol [meperidine].  MEDICATIONS:  Current Facility-Administered Medications  Medication  Dose Route Frequency Provider Last Rate Last Admin   acetaminophen  (TYLENOL ) tablet 650 mg  650 mg Oral Q6H PRN Duncan, Hazel V, MD       Or   acetaminophen  (TYLENOL ) suppository 650 mg  650 mg Rectal Q6H PRN Cleatus Delayne GAILS, MD       DULoxetine  (CYMBALTA ) DR capsule 20 mg  20 mg Oral Daily Duncan, Hazel V, MD   20 mg at 11/25/24 0919   furosemide  (LASIX ) injection 20 mg  20 mg Intravenous Q12H Duncan, Hazel V, MD   20 mg at 11/25/24 0756   heparin  injection 5,000 Units  5,000 Units Subcutaneous Q12H Cleatus Delayne GAILS, MD   5,000 Units at 11/25/24 9353   morphine  (PF) 2 MG/ML injection 2 mg  2 mg Intravenous Q30 min PRN Quale, Mark, MD       morphine  (PF) 2 MG/ML injection 2 mg  2 mg Intravenous Q2H PRN Duncan, Hazel V, MD   2 mg at 11/25/24 1201   ondansetron  (ZOFRAN ) tablet 4 mg  4 mg Oral Q6H PRN Duncan, Hazel V, MD       Or   ondansetron  (ZOFRAN ) injection 4 mg  4 mg Intravenous Q6H PRN Duncan, Hazel V, MD       oxyCODONE  (OXYCONTIN ) 12 hr tablet 10 mg  10 mg Oral Q12H Cleatus Delayne GAILS, MD       oxyCODONE -acetaminophen  (PERCOCET/ROXICET) 5-325 MG per tablet 1-2 tablet  1-2 tablet Oral Q4H PRN Duncan, Hazel V, MD       Current Outpatient Medications  Medication Sig Dispense Refill   amitriptyline  (ELAVIL ) 10 MG tablet Take 20 mg by mouth at bedtime.     cetirizine (ZYRTEC) 5 MG tablet Take 5 mg by mouth.     cyanocobalamin  (,VITAMIN B-12,) 1000 MCG/ML injection Inject into the muscle every 14 (fourteen) days. Next injection 4/16     cyanocobalamin  (VITAMIN B12) 1000 MCG tablet Take 1,000 mcg by mouth daily.     DULoxetine  (CYMBALTA ) 20 MG capsule Take 20 mg by mouth daily.     Ferrous Sulfate 28 MG TABS Take 28 mg by mouth daily.     furosemide  (LASIX ) 20 MG tablet TAKE TWO TABLETS (40 MG TOTAL) BY MOUTH DAILY. 60 tablet 5   MELATONIN GUMMIES PO Take 3 mg by mouth at bedtime as needed.     mometasone (NASONEX) 50 MCG/ACT nasal spray Place 2 sprays into the nose daily.     naloxone  (NARCAN )  nasal spray 4 mg/0.1 mL SPRAY 1 SPRAY INTO ONE NOSTRIL AS DIRECTED FOR OPIOID OVERDOSE (TURN PERSON ON SIDE AFTER DOSE. IF NO RESPONSE IN 2-3 MINUTES OR PERSON RESPONDS BUT RELAPSES, REPEAT USING A NEW SPRAY DEVICE AND SPRAY INTO THE OTHER NOSTRIL. CALL 911 AFTER USE.) * EMERGENCY USE ONLY * 1 each 0   ondansetron  (ZOFRAN ) 8 MG tablet TAKE ONE TABLET (8 MG TOTAL) BY MOUTH EVERY EIGHT HOURS AS NEEDED FOR NAUSEA OR VOMITING. 20 tablet 0   oxyCODONE  ER (XTAMPZA  ER) 9 MG C12A TAKE 1 CAPSULE BY MOUTH EVERY 12 HOURS. 60 capsule 0   oxyCODONE -acetaminophen  (PERCOCET/ROXICET) 5-325 MG tablet Take 1-2 tablets by mouth every 4 (four) hours as needed for  severe pain (pain score 7-10). 90 tablet 0   pantoprazole (PROTONIX) 40 MG tablet Take 40 mg by mouth daily.     polyethylene glycol powder (GLYCOLAX /MIRALAX ) 17 GM/SCOOP powder Take 17 g by mouth 2 (two) times daily as needed for moderate constipation. 255 g 0   predniSONE  (DELTASONE ) 20 MG tablet TAKE 1 TABLET (20 MG TOTAL) BY MOUTH DAILY WITH BREAKFAST. 30 tablet 1    VITAL SIGNS: BP (!) 137/47 (BP Location: Left Arm)   Pulse 86   Temp 99.4 F (37.4 C) (Oral)   Resp 20   Ht 5' 3 (1.6 m)   Wt 97 lb (44 kg)   SpO2 100%   BMI 17.18 kg/m  Filed Weights   11/25/24 0033  Weight: 97 lb (44 kg)    Estimated body mass index is 17.18 kg/m as calculated from the following:   Height as of this encounter: 5' 3 (1.6 m).   Weight as of this encounter: 97 lb (44 kg).  LABS: CBC:    Component Value Date/Time   WBC 189.6 (HH) 11/25/2024 0112   HGB 9.5 (L) 11/25/2024 0112   HGB 9.9 (L) 11/10/2024 1023   HGB 14.1 03/06/2014 2049   HCT 33.0 (L) 11/25/2024 0112   HCT 42.9 03/06/2014 2049   PLT 158 11/25/2024 0112   PLT 166 11/10/2024 1023   PLT 240 03/06/2014 2049   MCV 104.4 (H) 11/25/2024 0112   MCV 88 03/06/2014 2049   NEUTROABS 6.7 11/25/2024 0112   NEUTROABS 7.3 (H) 03/06/2014 2049   LYMPHSABS 181.1 (H) 11/25/2024 0112   LYMPHSABS 6.6 (H)  03/06/2014 2049   MONOABS 1.0 11/25/2024 0112   MONOABS 1.3 (H) 03/06/2014 2049   EOSABS 0.1 11/25/2024 0112   EOSABS 0.4 03/06/2014 2049   BASOSABS 0.0 11/25/2024 0112   BASOSABS 0.1 03/06/2014 2049   Comprehensive Metabolic Panel:    Component Value Date/Time   NA 136 11/25/2024 0112   NA 138 09/04/2023 1154   NA 138 03/06/2014 2049   K 4.7 11/25/2024 0112   K 3.6 03/06/2014 2049   CL 95 (L) 11/25/2024 0112   CL 107 03/06/2014 2049   CO2 23 11/25/2024 0112   CO2 29 03/06/2014 2049   BUN 72 (H) 11/25/2024 0112   BUN 58 (H) 09/04/2023 1154   BUN 14 03/06/2014 2049   CREATININE 2.05 (H) 11/25/2024 0112   CREATININE 1.85 (H) 10/27/2024 0934   CREATININE 0.75 03/06/2014 2049   GLUCOSE 125 (H) 11/25/2024 0112   GLUCOSE 103 (H) 03/06/2014 2049   CALCIUM  8.4 (L) 11/25/2024 0112   CALCIUM  8.9 03/06/2014 2049   AST 31 11/25/2024 0112   AST 19 09/30/2024 1046   ALT 29 11/25/2024 0112   ALT 28 09/30/2024 1046   ALKPHOS 167 (H) 11/25/2024 0112   BILITOT 0.9 11/25/2024 0112   BILITOT 0.8 09/30/2024 1046   PROT 5.8 (L) 11/25/2024 0112   ALBUMIN 4.1 11/25/2024 0112    RADIOGRAPHIC STUDIES: DG Chest Portable 1 View Result Date: 11/25/2024 EXAM: 1 VIEW(S) XRAY OF THE CHEST 11/25/2024 01:23:00 AM COMPARISON: 09/30/2024 CLINICAL HISTORY: Hypoxia FINDINGS: LUNGS AND PLEURA: Mild pulmonary edema. Left basilar airspace disease may reflect atelectasis or pneumonia. Small left pleural effusion. Right lower lobe nodule seen on prior x-ray not well visualized. No pneumothorax. HEART AND MEDIASTINUM: Cardiomegaly. Thoracic aortic calcifications. BONES AND SOFT TISSUES: No acute osseous abnormality. IMPRESSION: 1. Mild pulmonary edema. 2. Left basilar airspace disease, possibly atelectasis or pneumonia. 3. Small left pleural effusion.  4. Cardiomegaly. Electronically signed by: Morgane Naveau MD 11/25/2024 01:28 AM EST RP Workstation: [id number]   DG Pelvis 1-2 Views Result Date: 10/30/2024 EXAM:  1 or 2 VIEW(S) XRAY OF THE PELVIS 10/30/2024 12:06:18 PM COMPARISON: None available. CLINICAL HISTORY: hip/back pain FINDINGS: BONES AND JOINTS: Status post left hip arthroplasty. No acute fracture or dislocation is noted. No focal osseous lesion. SOFT TISSUES: The soft tissues are unremarkable. IMPRESSION: 1. Status post left hip arthroplasty. 2. No acute fracture or dislocation. Electronically signed by: Lynwood Seip MD 10/30/2024 12:45 PM EST RP Workstation: [id number]   DG Lumbar Spine Complete Result Date: 10/30/2024 EXAM: 4 VIEW(S) XRAY OF THE LUMBAR SPINE 10/30/2024 12:06:18 PM COMPARISON: 04/12/2024 CLINICAL HISTORY: low back pain FINDINGS: LUMBAR SPINE: BONES: Old T12 fracture is noted. No definite acute fracture. No aggressive appearing osseous lesion. Alignment shows mild levoscoliosis of the lumbar spine. DISCS AND DEGENERATIVE CHANGES: Severe degenerative disc disease is noted at L2-3 and L3-4. Mild grade 1 anterolisthesis of L4-5 is noted secondary to posterior facet joint hypertrophy. SOFT TISSUES: No acute abnormality. IMPRESSION: 1. No acute abnormality of the lumbar spine. 2. Severe degenerative disc disease at L2-3 and L3-4. 3. Mild grade 1 anterolisthesis of L4-5, likely secondary to posterior facet arthropathy. 4. Mild levoscoliosis of the lumbar spine. Electronically signed by: Lynwood Seip MD 10/30/2024 12:43 PM EST RP Workstation: [id number]   US  Venous Img Lower Bilateral Result Date: 10/29/2024 CLINICAL DATA:  Lower extremity edema and pain. EXAM: Bilateral Lower Extremity Venous Doppler Ultrasound TECHNIQUE: Gray-scale sonography with compression, as well as color and duplex ultrasound, were performed to evaluate the deep venous system(s) from the level of the common femoral vein through the popliteal and proximal calf veins. COMPARISON:  07/17/2022 FINDINGS: VENOUS Normal compressibility of the common femoral, superficial femoral, and popliteal veins, as well as the visualized calf  veins. Visualized portions of profunda femoral vein and great saphenous vein unremarkable. No filling defects to suggest DVT on grayscale or color Doppler imaging. Doppler waveforms show normal direction of venous flow, normal respiratory plasticity and response to augmentation. OTHER Subcutaneous edema noted in the LEFT calf. Limitations: none IMPRESSION: No lower extremity DVT. Electronically Signed   By: Aliene Lloyd M.D.   On: 10/29/2024 10:08    PERFORMANCE STATUS (ECOG) : 3 - Symptomatic, >50% confined to bed  Review of Systems Unable to complete  Physical Exam General: Ill-appearing Extremities: no edema, no joint deformities Skin: no rashes Neurological: Weakness, confused  IMPRESSION: Patient with CLL off treatment.  Overall with disease progression and declining performance status.  Patient was recently referred to hospice at home but had not yet started hospice care.  She was sent to the emergency department with intractable pain and found to have CHF.    Patient seen in the emergency department.  She is currently confused and unable to engage meaningfully in a conversation regarding goals.  I spoke with her granddaughter who is at bedside.  Family recognize that patient is declining and likely rapidly nearing end-of-life. Per granddaughter, family priority of keeping patient comfortable.  They are in agreement with hospice care and would prefer inpatient hospice facility if possible.  I do feel that this is clinically appropriate and will speak with hospice MD and involve the hospice liaison to coordinate.  PLAN: - Best supportive care - Family interested in hospice IPU   Time Total: 50 minutes  Visit consisted of counseling and education dealing with the complex and emotionally intense issues of symptom management and  palliative care in the setting of serious and potentially life-threatening illness.Greater than 50%  of this time was spent counseling and coordinating care  related to the above assessment and plan.  Signed by: Fonda Mower, PhD, NP-C

## 2024-11-25 NOTE — ED Triage Notes (Addendum)
 Pt to triage via w/c with no distress noted; family reports pt currently being tx for CLL; taking percocet (ds taken at 10pm and 2nd ds at 11pm) and oxycodone  ER every 12hrs (last ds at 10pm); pt c/o persistent unrelieved upper abd and lower back pain accomp by [facility] and swelling to feet; pt taken to room 17 and placed on card monitor; fingertips cool to touch; O2 sat reading 89% on ra; placed on 3l/min O2 via [place] to bring sat to 100%; Dr Dicky at bedside; warm blankets provided for comfort

## 2024-11-25 NOTE — ED Notes (Signed)
 CCMD called to initiate cardiac monitoring.

## 2024-11-25 NOTE — Assessment & Plan Note (Addendum)
 Creatinine 2.05 up from baseline of 1.46 Expecting worsening with IV diuretics

## 2024-11-25 NOTE — ED Provider Notes (Signed)
 [facility] Provider Note    Event Date/Time   First MD Initiated Contact with Patient 11/25/24 [phone]     (approximate)   History   Abdominal Pain   HPI  Lindsay [name] is a 89 y.o. female with active lymphoma.  She has been having severe intractable abdominal pain and they reports that they have been told this is due to issues with her spleen and she has chronic abdominal symptoms.  They come for treatment of her abdominal pain but daughter also notes that she has noticed quite a bit of swelling in her legs developing over the last several weeks and she is taking Lasix  but not seeing improvement.  She has had some shortness of breath  They would like limited treatment to evaluate for possible fluid in her lungs.  They are also wanting treatment for pain control but do not wish for any diagnostic testing or specific medical therapy related to the pain other than pain control  They do not wish for any other extensive testing   She does not have any chest pain [we will forego troponin testing given her goals of care]  Physical Exam   Triage Vital Signs: ED Triage Vitals  Encounter Vitals Group     BP 11/25/24 0032 118/64     Girls Systolic BP Percentile --      Girls Diastolic BP Percentile --      Boys Systolic BP Percentile --      Boys Diastolic BP Percentile --      Pulse Rate 11/25/24 0032 (!) 102     Resp --      Temp 11/25/24 0032 97.9 F (36.6 C)     Temp Source 11/25/24 0032 Oral     SpO2 11/25/24 0032 (!) 89 %     Weight 11/25/24 0033 97 lb (44 kg)     Height 11/25/24 0033 5' 3 (1.6 m)     Head Circumference --      Peak Flow --      Pain Score 11/25/24 0041 10     Pain Loc --      Pain Education --      Exclude from Growth Chart --     Most recent vital signs: Vitals:   11/25/24 0100 11/25/24 0200  BP:  128/62  Pulse:    Resp:  13  Temp:    SpO2: 100% 100%     General: Awake, no distress though she appears just slightly  dyspneic CV:  Good peripheral perfusion.  Normal tones Resp:  Normal effort Septer just slight tachypnea.  [place] bases bilateral no obvious rales Abd:  No distention.  She reports moderate to severe mid to upper abdominal pain.  This been ongoing for a long time and is the chronic pain she is seeing hospice and her doctor for. [He reaffirms she does not wish for any treatment or diagnostic testing related to the abdominal pain aside from pain control] Other:  Moderate to severe bilateral lower extremity pitting edema   ED Results / Procedures / Treatments   Labs (all labs ordered are listed, but only abnormal results are displayed) Labs Reviewed  CBC WITH DIFFERENTIAL/PLATELET - Abnormal; Notable for the following components:      Result Value   WBC 189.6 (*)    RBC 3.16 (*)    Hemoglobin 9.5 (*)    HCT 33.0 (*)    MCV 104.4 (*)    MCHC 28.8 (*)  Lymphs Abs 181.1 (*)    Abs Immature Granulocytes 0.70 (*)    All other components within normal limits  COMPREHENSIVE METABOLIC PANEL WITH GFR - Abnormal; Notable for the following components:   Chloride 95 (*)    Glucose, Bld 125 (*)    BUN 72 (*)    Creatinine, Ser 2.05 (*)    Calcium  8.4 (*)    Total Protein 5.8 (*)    Alkaline Phosphatase 167 (*)    GFR, Estimated 23 (*)    Anion gap 17 (*)    All other components within normal limits  PRO BRAIN NATRIURETIC PEPTIDE - Abnormal; Notable for the following components:   Pro Brain Natriuretic Peptide >35,000.0 (*)    All other components within normal limits  TROPONIN T, HIGH SENSITIVITY - Abnormal; Notable for the following components:   Troponin T High Sensitivity 120 (*)    All other components within normal limits     EKG  Interpreted by me at 1 AM heart rate 105 QRS 150 QTc 540 left bundle branch block sinus rhythm occasional sinus arrhythmia   RADIOLOGY  Chest x-ray inter by me as mild pulmonary edema\  DG Chest Portable 1 View Result Date: 11/25/2024 EXAM: 1  VIEW(S) XRAY OF THE CHEST 11/25/2024 01:23:00 AM COMPARISON: 09/30/2024 CLINICAL HISTORY: Hypoxia FINDINGS: LUNGS AND PLEURA: Mild pulmonary edema. Left basilar airspace disease may reflect atelectasis or pneumonia. Small left pleural effusion. Right lower lobe nodule seen on prior x-ray not well visualized. No pneumothorax. HEART AND MEDIASTINUM: Cardiomegaly. Thoracic aortic calcifications. BONES AND SOFT TISSUES: No acute osseous abnormality. IMPRESSION: 1. Mild pulmonary edema. 2. Left basilar airspace disease, possibly atelectasis or pneumonia. 3. Small left pleural effusion. 4. Cardiomegaly. Electronically signed by: Morgane Naveau MD 11/25/2024 01:28 AM EST RP Workstation: [id number]      PROCEDURES:  Critical Care performed: No  Procedures   MEDICATIONS ORDERED IN ED: Medications  morphine  (PF) 2 MG/ML injection 2 mg (has no administration in time range)  morphine  (PF) 4 MG/ML injection 4 mg (4 mg Intravenous Given 11/25/24 0117)  furosemide  (LASIX ) injection 40 mg (40 mg Intravenous Given 11/25/24 0148)     IMPRESSION / MDM / ASSESSMENT AND PLAN / ED COURSE  I reviewed the triage vital signs and the nursing notes.                              Differential diagnosis includes, but is not limited to, treatment for intractable abdominal pain which we will address but not provide any further diagnostic testing related to it at the patient's goals of care request.  Also requested seen by daughter.  They are interested in treatment for concerns of possible fluid buildup in the lungs.  Again they do not wish for deep diagnostic testing but if x-ray looks like fluid buildup would like treatment with Lasix .  She does not have any obvious infectious symptoms she does not have a cough no fevers or chills.  She does appear in pain and we have given her IV morphine  which has been helpful, she took 10 mg of oxycodone  about an hour or 2 before coming to the ER which was not helpful  EKG shows  left bundle branch block no chest pain.  Other considerations for pain such as thromboembolism, infection etc. be considered but in her particular case an instance we will not pursue further diagnostic testing at this point as it appears most likely  pulmonary edema and her goals of care are to attempt diuresis but no further treatment related to this issue  Patient's presentation is most consistent with acute complicated illness / injury requiring diagnostic workup.  The patient is on the cardiac monitor to evaluate for evidence of arrhythmia and/or significant heart rate changes.   Clinical Course as of 11/25/24 0246  Tue Nov 25, 2024  0136 Pain improving.  Reaffirmed goals of care with patient and her daughter, they note that she is primarily comfort but acceptable with some noninvasive treatments like improving her bleeding with fluids.  She does not wish to pursue anything heroic.  They would like for her to to get her breathing improved and be admitted for hospice evaluation tomorrow.  I think is quite reasonable.  Her pain is now much better and she is resting comfortably on 3 L oxygen.  We have achieved her primary goal of treatment which is pain control, but secondary treatment of what appears to be likely CHF volume overload we will also initiate IV Lasix  and anticipate admission to hospital.  Condition improved.  They affirmed DO NOT RESUSCITATE status.  Primarily focused on comfort [MQ]    Clinical Course User Index [MQ] Dicky Anes, MD   ----------------------------------------- 2:46 AM on 11/25/2024 ----------------------------------------- Consulted with patient accepted to hospital service by Dr. Cleatus  FINAL CLINICAL IMPRESSION(S) / ED DIAGNOSES   Final diagnoses:  Acute hypoxemic respiratory failure (HCC)  Acute on chronic congestive heart failure, unspecified heart failure type (HCC)  Intractable abdominal pain     Rx / DC Orders   ED Discharge Orders     None         Note:  This document was prepared using Dragon voice recognition software and may include unintentional dictation errors.   Dicky Anes, MD 11/25/24 [phone]

## 2024-11-25 NOTE — ED Notes (Addendum)
 This RN called report to [facility] and spoke to Anette Moats, RN.

## 2024-11-25 NOTE — Progress Notes (Signed)
 Brief rounding note, same day as admission  HPI: Pt admitted after midnight for cute on chronic systolic CHF, started on IV Lasix  for diuresis.  See H&P for full history on admission and ED course.  Interval history: Patient seen in the ED holding for a bed, daughter at bedside.  Daughter reports that patient's typical pain medicine did not touch her back pain even with extra dose given as advised by Dr. Rennie her oncologist.  They were waiting for palliative care evaluation tomorrow.  Exam: General exam: awake, alert, no acute distress, frail HEENT: moist mucus membranes, hearing grossly normal  Respiratory system: CTAB, no wheezes, rales or rhonchi, normal respiratory effort at rest, on 3 L/min [place] O2. Cardiovascular system: normal S1/S2, RRR, plus pitting edema bilateral lower extremities Gastrointestinal system: soft, NT, ND Central nervous system: A&O x 3. no gross focal neurologic deficits, normal speech Skin: dry, intact, normal temperature Psychiatry: normal mood, congruent affect, judgement and insight appear normal   A&P: as per H&P by Dr. Cleatus, with any changes or additions as below:  --Will repeat a BMP this evening and reassess whether or not to continue Lasix . -- Palliative care consulted      No charge

## 2024-11-25 NOTE — Progress Notes (Signed)
 [facility] Liaison Note  This patient has a pending hospice referral with [facility].  Referral was going to be opened at the patient's home this evening. [facility] will follow through discharge disposition.   Please call with any hospice related questions or concerns.  [facility] Liaison [phone]

## 2024-11-25 NOTE — Progress Notes (Signed)
 [facility] Liaison Note  Received request from Transitions of Care Manger for family interest in [facility].  Eligibility has been confirmed.  Met/Spoke with granddaughter to confirm interest and explain services.  Family agreeable to transfer today.  Transitions of Care manager aware.  RN please call report to [phone]  [facility]) prior to patient leaving the unit.  Please send signed and completed DNR with patient at discharge.  Thank you  Marinell Nova,  [facility] Liaison [phone]

## 2024-11-25 NOTE — Discharge Summary (Signed)
 Physician Discharge Summary   Patient: Lindsay [name] MRN: [id number] DOB: 05/31/1934  Admit date:     11/25/2024  Discharge date: 11/25/24  Discharge Physician: Burnard DELENA Cunning   PCP: Valora Lynwood FALCON, MD   Recommendations at discharge:    Follow up with Hospice medical staff upon arrival  Discharge Diagnoses: Principal Problem:   Acute on chronic systolic (congestive) heart failure (HCC) Active Problems:   Acute on chronic cancer-related pain   CLL (chronic lymphocytic leukemia) (HCC)   Acute renal failure superimposed on stage 3b chronic kidney disease (HCC)   Palliative care encounter   Pressure injury of skin  Resolved Problems:   * No resolved hospital problems. *  Hospital Course: Lindsay [name] is a 89 y.o. female with medical history significant for HTN, CKD lllb, systolic CHF, , CLL with chronic cancer-related pain, with disease progression off treatment due to poor tolerance, on as needed blood transfusions, followed by palliative care and awaiting hospice evaluation, being admitted with CHF exacerbation and intractable cancer-related pain.  Family does not want extensive diagnostic workup but would like hospital treatment to get patient feeling better with the goal that she can feel more comfortable and make it past Christmas.  She presented with shortness of breath, lower extremity edema and worsening of her chronic abdominal and back pain in spite chronic pain meds.  She called the cancer center and they recommended that she present to the ED. In the ED she was tachycardic with O2 sats in the mid 80s, improving to 100% on 3 L.  Afebrile with BP 118/64. Labs notable for WBC 189, hemoglobin 9.5, platelets 158, all expected based on her diagnosis CMP notable for mild worsening of her renal function with creatinine 2.05 up from 1.46 a month ago.  Mildly elevated alk phos. Troponin 120 and proBNP 35,000 EKG showed A-fib at 105 with known LBBB Chest x-ray showed mild  pulmonary edema and left basilar airspace disease possibly atelectasis or pneumonia small left pleural effusion and cardiomegaly.   Patient treated with morphine  in the ED with improvement in pain She was treated with 40 mg Lasix   Patient was admitted earlier this AM 12.9.25 for continued IV diuresis.  Palliative care was consulted as there was an outpatient initial Palliative evaluation scheduled tomorrow.  Palliative NP met with patient and family and they have made the decision to transition patient to comfort care measures.  Patient has been accepted to Inpatient Hospice unit and is medically stable for transport there today.    Assessment and Plan:  Comfort Care -- per orders Appreciate Palliative Care recommendations Continue pain control per orders & titrate regimen as needed Nofify on call provider if uncontrolled pain or any signs of discomfort despite ordered Medications.   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 Awaiting hospice evaluation Continue home oxycodone  ER Morphine  for breakthrough Multimodal pain management Consider hospice eval while in house  Acute renal failure superimposed on stage 3b chronic kidney disease (HCC) Creatinine 2.05 up from baseline of 1.46 Expecting worsening with IV diuretics  Consultants: Oncology, Palliative Care Procedures performed: NOne  Disposition: Hospice care Diet recommendation:  Per patient pleasure /  comfort  DISCHARGE MEDICATION: Allergies as of 11/25/2024       Reactions   Gabapentin Swelling   05/01/23: Pt stated she has facial swelling when taking Gabapentin.   Demerol [meperidine]         Medication List     STOP taking these medications    cyanocobalamin  1000 MCG tablet Commonly known as: VITAMIN B12   cyanocobalamin  1000 MCG/ML injection Commonly known as: VITAMIN B12   Ferrous Sulfate 28 MG Tabs   furosemide  20 MG tablet Commonly known as: LASIX    MELATONIN GUMMIES PO   mometasone 50 MCG/ACT nasal spray Commonly known as: NASONEX   naloxone  4 MG/0.1ML Liqd nasal spray kit Commonly known as: NARCAN    predniSONE  20 MG tablet Commonly known as: DELTASONE    Xtampza  ER 9 MG C12a Generic drug: oxyCODONE  ER Replaced by: oxyCODONE  10 mg 12 hr tablet       TAKE these medications    acetaminophen  325 MG tablet Commonly known as: TYLENOL  Take 2 tablets (650 mg total) by mouth every 6 (six) hours as needed for mild pain (pain score 1-3) or fever (or Fever >/= 101).   amitriptyline  10 MG tablet Commonly known as: ELAVIL  Take 20 mg by mouth at bedtime.   cetirizine 5 MG tablet Commonly known as: ZYRTEC Take 5 mg by mouth.   DULoxetine  20 MG capsule Commonly known as: CYMBALTA  Take 1 capsule (20 mg total) by mouth daily. Start taking on: November 26, 2024   LORazepam  2 MG/ML injection Commonly known as: ATIVAN  Inject 0.25-0.5 mLs (0.5-1 mg total) into the vein every 4 (four) hours as needed for anxiety or sedation.   morphine  (PF) 2 MG/ML injection Inject 1 mL (2 mg total) into the vein every 30 (thirty) minutes as needed (abdominal pain).   morphine  (PF) 2 MG/ML injection Inject 1 mL (2 mg total) into the vein every 2 (two) hours as needed (severe pain not resolved by Oxy/APAP or pt unable to take PO med).   ondansetron  4 MG tablet Commonly known as: ZOFRAN  Take 1 tablet (4 mg total) by mouth every 6 (six) hours as needed for nausea. What  changed:  medication strength See the new instructions.   oxyCODONE  10 mg 12 hr tablet Commonly known as: OXYCONTIN  Take 1 tablet (10 mg total) by mouth every 12 (twelve) hours. Replaces: Xtampza  ER 9 MG C12a   oxyCODONE -acetaminophen  5-325 MG tablet Commonly known as: PERCOCET/ROXICET Take 1-2 tablets by mouth every 4 (four) hours as needed for severe pain (pain score 7-10).   pantoprazole 40 MG tablet Commonly known as: PROTONIX Take 40 mg by mouth daily.   polyethylene glycol powder 17 GM/SCOOP powder Commonly known as: GLYCOLAX /MIRALAX  Take 17 g by mouth 2 (two) times daily as needed for moderate constipation.        Discharge Exam: Filed Weights   11/25/24 0033  Weight: 44 kg   General exam: awake, alert, no acute distress, frail HEENT: moist mucus membranes, hearing grossly normal  Respiratory system: CTAB, no wheezes, rales or rhonchi, normal respiratory effort at rest, on 3 L/min [place] O2. Cardiovascular system: normal S1/S2, RRR, plus pitting edema bilateral lower extremities Gastrointestinal system: soft, NT, ND Central nervous system: A&O x 3. no gross focal neurologic deficits, normal speech Skin: dry, intact, normal temperature Psychiatry: normal mood, congruent affect, judgement and insight appear normal    Condition at  discharge: stable  The results of significant diagnostics from this hospitalization (including imaging, microbiology, ancillary and laboratory) are listed below for reference.   Imaging Studies: DG Chest Portable 1 View Result Date: 11/25/2024 EXAM: 1 VIEW(S) XRAY OF THE CHEST 11/25/2024 01:23:00 AM COMPARISON: 09/30/2024 CLINICAL HISTORY: Hypoxia FINDINGS: LUNGS AND PLEURA: Mild pulmonary edema. Left basilar airspace disease may reflect atelectasis or pneumonia. Small left pleural effusion. Right lower lobe nodule seen on prior x-ray not well visualized. No pneumothorax. HEART AND MEDIASTINUM: Cardiomegaly. Thoracic aortic calcifications.  BONES AND SOFT TISSUES: No acute osseous abnormality. IMPRESSION: 1. Mild pulmonary edema. 2. Left basilar airspace disease, possibly atelectasis or pneumonia. 3. Small left pleural effusion. 4. Cardiomegaly. Electronically signed by: Morgane Naveau MD 11/25/2024 01:28 AM EST RP Workstation: [id number]   DG Pelvis 1-2 Views Result Date: 10/30/2024 EXAM: 1 or 2 VIEW(S) XRAY OF THE PELVIS 10/30/2024 12:06:18 PM COMPARISON: None available. CLINICAL HISTORY: hip/back pain FINDINGS: BONES AND JOINTS: Status post left hip arthroplasty. No acute fracture or dislocation is noted. No focal osseous lesion. SOFT TISSUES: The soft tissues are unremarkable. IMPRESSION: 1. Status post left hip arthroplasty. 2. No acute fracture or dislocation. Electronically signed by: Lynwood Seip MD 10/30/2024 12:45 PM EST RP Workstation: [id number]   DG Lumbar Spine Complete Result Date: 10/30/2024 EXAM: 4 VIEW(S) XRAY OF THE LUMBAR SPINE 10/30/2024 12:06:18 PM COMPARISON: 04/12/2024 CLINICAL HISTORY: low back pain FINDINGS: LUMBAR SPINE: BONES: Old T12 fracture is noted. No definite acute fracture. No aggressive appearing osseous lesion. Alignment shows mild levoscoliosis of the lumbar spine. DISCS AND DEGENERATIVE CHANGES: Severe degenerative disc disease is noted at L2-3 and L3-4. Mild grade 1 anterolisthesis of L4-5 is noted secondary to posterior facet joint hypertrophy. SOFT TISSUES: No acute abnormality. IMPRESSION: 1. No acute abnormality of the lumbar spine. 2. Severe degenerative disc disease at L2-3 and L3-4. 3. Mild grade 1 anterolisthesis of L4-5, likely secondary to posterior facet arthropathy. 4. Mild levoscoliosis of the lumbar spine. Electronically signed by: Lynwood Seip MD 10/30/2024 12:43 PM EST RP Workstation: [id number]   US  Venous Img Lower Bilateral Result Date: 10/29/2024 CLINICAL DATA:  Lower extremity edema and pain. EXAM: Bilateral Lower Extremity Venous Doppler Ultrasound TECHNIQUE: Gray-scale sonography  with compression, as well as color and duplex ultrasound, were performed to evaluate the deep venous system(s) from the level of the common femoral vein through the popliteal and proximal calf veins. COMPARISON:  07/17/2022 FINDINGS: VENOUS Normal compressibility of the common femoral, superficial femoral, and popliteal veins, as well as the visualized calf veins. Visualized portions of profunda femoral vein and great saphenous vein unremarkable. No filling defects to suggest DVT on grayscale or color Doppler imaging. Doppler waveforms show normal direction of venous flow, normal respiratory plasticity and response to augmentation. OTHER Subcutaneous edema noted in the LEFT calf. Limitations: none IMPRESSION: No lower extremity DVT. Electronically Signed   By: Aliene Lloyd M.D.   On: 10/29/2024 10:08    Microbiology: Results for orders placed or performed in visit on 10/27/24  Urine Culture     Status: None   Collection Time: 10/27/24 11:36 AM   Specimen: Urine, Random  Result Value Ref Range Status   Specimen Description   Final    URINE, RANDOM Performed at [facility], [address], [place], [place] [address]    Special Requests   Final    NONE Performed at [facility], [address] [place], [place] [address]    Culture   Final  NO GROWTH Performed at [facility], [address], [place], [place] [address]    Report Status 10/28/2024 FINAL  Final    Labs: CBC: Recent Labs  Lab 11/25/24 0112  WBC 189.6*  NEUTROABS 6.7  HGB 9.5*  HCT 33.0*  MCV 104.4*  PLT 158   Basic Metabolic Panel: Recent Labs  Lab 11/25/24 0112  NA 136  K 4.7  CL 95*  CO2 23  GLUCOSE 125*  BUN 72*  CREATININE 2.05*  CALCIUM  8.4*   Liver Function Tests: Recent Labs  Lab 11/25/24 0112  AST 31  ALT 29  ALKPHOS 167*  BILITOT 0.9  PROT 5.8*  ALBUMIN 4.1   CBG: No results for input(s): GLUCAP in the last 168 hours.  Discharge time spent: less than 30  minutes.  Signed: Burnard DELENA Cunning, DO [facility] 11/25/2024

## 2024-11-25 NOTE — ED Notes (Signed)
 This RN left room to ask for assistance with heparin  administration. Upon arriving back in pt's room, pt was agitated, trying to leave bed, and stating Oh, Lord over and over. Pt's daughter also in room trying to reassure pt that it is okay to urinate. Nathanael RN and [employer]/support/administrative Officer came in room and assisted this RN with reassuring pt. Pt then laid back and is less agitated. Pt now calm. Pt respirations regular and unlabored.

## 2024-11-25 NOTE — ED Notes (Signed)
Assisted patient to bedside commode at this time.

## 2024-11-25 NOTE — Assessment & Plan Note (Addendum)
 Elevated troponin Troponin 120 and proBNP 35,000, chest x-ray with pulmonary edema No ischemic evaluation desired by family for elevated troponin in keeping with goals of care IV Lasix  for symptom control Daily weights with intake and output monitoring Supplemental oxygen for comfort

## 2024-11-25 NOTE — ED Notes (Signed)
 Pt ambulated to restroom at this time with assistance x2 and walker. Slipper socks applied. Returned to bed with bed alarm on.

## 2024-11-25 NOTE — Assessment & Plan Note (Addendum)
 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

## 2024-11-25 NOTE — TOC Initial Note (Signed)
 Transition of Care [facility]) - Initial/Assessment Note    Patient Details  Name: Lindsay [name] MRN: [id number] Date of Birth: [birth year]  Transition of Care [facility] CM/SW Contact:    Nathanael CHRISTELLA Ring, RN Phone Number: 11/25/2024, 2:13 PM  Clinical Narrative:                 Patient's family met with Palliative Care NP, Josh Borders.  They are unable to meet the patient's needs at home, they only brought her to the hospital for symptoms management, they would like to get her to the [facility].  Referral given to [facility] with [facility].  CM met with the granddaughter in the room in the ED, introduced self and explained role in DC planning, patient has been living with her mom at home.  Her mom stayed with her all last night and went home to get some rest. Confirmed with her the families wishes for Hospice home and patient comfort.  Also notified her ED nurse that they need assistance with toileting and granddaughter wondering if maybe they could do a foley catheter and that her pain is not well controlled.    Expected Discharge Plan: Hospice Medical Facility Barriers to Discharge: Hospice Bed not available (hospice to evaluate)   Patient Goals and CMS Choice Patient states their goals for this hospitalization and ongoing recovery are:: Granddaughter would like to get her grandmother into the [facility] CMS Medicare.gov Compare Post Acute Care list provided to:: Patient Represenative (must comment) Choice offered to / list presented to : Adult Children      Expected Discharge Plan and Services   Discharge Planning Services: CM Consult Post Acute Care Choice: Residential Hospice Bed Living arrangements for the past 2 months: Single Family Home                 DME Arranged: N/A         HH Arranged: NA          Prior Living Arrangements/Services Living arrangements for the past 2 months: Single Family Home Lives with:: Adult Children Patient language  and need for interpreter reviewed:: Yes Do you feel safe going back to the place where you live?: Yes      Need for Family Participation in Patient Care: Yes (Comment) Care giver support system in place?: Yes (comment) Current home services: DME Criminal Activity/Legal Involvement Pertinent to Current Situation/Hospitalization: No - Comment as needed  Activities of Daily Living      Permission Sought/Granted Permission sought to share information with : Facility Medical Sales Representative, Family Supports Permission granted to share information with : Yes, Verbal Permission Granted  Share Information with NAME: Lindsay [name]  Permission granted to share info w AGENCY: [facility]  Permission granted to share info w Relationship: daughter  Permission granted to share info w Contact Information: [phone]  Emotional Assessment Appearance:: Appears stated age       Alcohol / Substance Use: Not Applicable Psych Involvement: No (comment)  Admission diagnosis:  Acute on chronic systolic (congestive) heart failure (HCC) [I50.23] Patient Active Problem List   Diagnosis Date Noted   Acute on chronic systolic (congestive) heart failure (HCC) 11/25/2024   Acute on chronic cancer-related pain 11/25/2024   Acute renal failure superimposed on stage 3b chronic kidney disease (HCC) 11/25/2024   Palliative care encounter 11/25/2024   Pressure injury of skin 11/25/2024   Carpal tunnel syndrome 08/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   Osteoarthritis of multiple joints 08/19/2024   Calculus of  right kidney 08/19/2024   Symptomatic anemia 09/19/2023   Increase in serum creatinine from prior measurement 09/19/2023   DNR no code (do not resuscitate) 09/19/2023   Acute on chronic combined systolic and diastolic congestive heart failure (HCC) 06/18/2023   Macrocytic anemia 06/18/2023   Protein-calorie malnutrition, moderate 06/18/2023   Depression 06/18/2023   Myocardial injury 06/18/2023   Dehydration 05/03/2023   CKD  stage 3b, GFR 30-44 ml/min (HCC) 02/11/2023   Acute on chronic systolic CHF (congestive heart failure) (HCC) 07/17/2022   Acute respiratory failure with hypoxia (HCC) 07/17/2022   Essential hypertension 07/17/2022   Vitamin B12 deficiency 07/17/2022   Elevated d-dimer 07/17/2022   Kidney stone 05/16/2022   CHF exacerbation (HCC) 05/16/2022   Anemia    Lymphocytosis    Elevated troponin    Respiratory tract infection    Recurrent UTI    Iron deficiency 07/04/2021   CLL (chronic lymphocytic leukemia) (HCC) 02/14/2021   Hypertension 04/19/2020   Accidental fall 04/19/2020   Fracture of femoral neck, left, closed (HCC) 04/19/2020   Preoperative clearance 04/19/2020   Closed left hip fracture, initial encounter (HCC) 04/19/2020   Left bundle branch block 04/19/2020   Leukemia (HCC) 04/19/2020   Arthritis 03/10/2020   Calculus of kidney 03/10/2020   Diverticulosis 03/10/2020   Fibromyalgia 03/10/2020   IBS (irritable bowel syndrome) 03/10/2020   PCP:  Valora Lynwood FALCON, MD Pharmacy:   [facility] - [place], [place] - [address] [place] [address] Phone: [phone] Fax: [phone]  [id number] - [place], [facility] - [address] [place] [address] Phone: [phone] Fax: [phone]     Social Drivers of Health (SDOH) Social History: SDOH Screenings   Food Insecurity: No Food Insecurity (12/24/2023)   Received from [facility]  Housing: Low Risk  (01/24/2024)   Received from [facility] [facility]  Transportation Needs: No Transportation Needs (12/24/2023)   Received from [facility]  Utilities: Not At Risk (12/24/2023)   Received from [facility]  Depression [phone]): Low Risk  (11/10/2024)  Financial Resource Strain: Low Risk  (12/24/2023)   Received from [facility]  Tobacco Use: Low Risk  (11/25/2024)   SDOH  Interventions:     Readmission Risk Interventions     No data to display

## 2024-11-25 NOTE — H&P (Signed)
 History and Physical    Patient: Lindsay [name] FMW:[id number] DOB: 20-Oct-1934 DOA: 11/25/2024 DOS: the patient was seen and examined on 11/25/2024 PCP: Valora Lynwood FALCON, MD  Patient coming from: Home  Chief Complaint:  Chief Complaint  Patient presents with   Abdominal Pain    HPI: Lindsay [name] is a 89 y.o. female with medical history significant for HTN, CKD lllb, systolic CHF, , CLL with chronic cancer-related pain, with disease progression off treatment due to poor tolerance, on as needed blood transfusions, followed by palliative care and awaiting hospice evaluation, being admitted with CHF exacerbation and intractable cancer-related pain.  Family does not want extensive diagnostic workup but would like hospital treatment to get patient feeling better with the goal that she can feel more comfortable and make it past Christmas.  She presented with shortness of breath, lower extremity edema and worsening of her chronic abdominal and back pain in spite chronic pain meds.  She called the cancer center and they recommended that she present to the ED. In the ED she was tachycardic with O2 sats in the mid 80s, improving to 100% on 3 L.  Afebrile with BP 118/64. Labs notable for WBC 189, hemoglobin 9.5, platelets 158, all expected based on her diagnosis CMP notable for mild worsening of her renal function with creatinine 2.05 up from 1.46 a month ago.  Mildly elevated alk phos. Troponin 120 and proBNP 35,000 EKG showed A-fib at 105 with known LBBB Chest x-ray showed mild pulmonary edema and left basilar airspace disease possibly atelectasis or pneumonia small left pleural effusion and cardiomegaly.  Patient treated with morphine  in the ED with improvement in pain She was treated with 40 mg Lasix   Admission requested.     Past Medical History:  Diagnosis Date   Arthritis    RHEUMATOID   CHF (congestive heart failure) (HCC)    CLL (chronic lymphocytic leukemia) (HCC)    Edema     MILD ANKLE OCCAS   Fibromyalgia    GERD (gastroesophageal reflux disease)    Hypertension    Kidney stones    RLS (restless legs syndrome)    Past Surgical History:  Procedure Laterality Date   ABDOMINAL HYSTERECTOMY     CATARACT EXTRACTION W/PHACO Left 02/07/2016   Procedure: CATARACT EXTRACTION PHACO AND INTRAOCULAR LENS PLACEMENT (IOC);  Surgeon: Steven Dingeldein, MD;  Location: [facility];  Service: Ophthalmology;  Laterality: Left;  US  01:33 AP% 24.7 CDE 39.57 fluid pack lot # [id number]   EXTRACORPOREAL SHOCK WAVE LITHOTRIPSY Right 07/03/2019   Procedure: EXTRACORPOREAL SHOCK WAVE LITHOTRIPSY (ESWL);  Surgeon: Kassie Ozell SAUNDERS, MD;  Location: [facility];  Service: Urology;  Laterality: Right;   EYE SURGERY     HIP ARTHROPLASTY Left 04/20/2020   Procedure: ARTHROPLASTY BIPOLAR HIP (HEMIARTHROPLASTY);  Surgeon: Kathlynn Ozell, MD;  Location: [facility];  Service: Orthopedics;  Laterality: Left;   LITHOTRIPSY     TONSILLECTOMY     Social History:  reports that she has never smoked. She has never used smokeless tobacco. She reports that she does not drink alcohol and does not use drugs.  Allergies  Allergen Reactions   Gabapentin Swelling    05/01/23: Pt stated she has facial swelling when taking Gabapentin.   Demerol [Meperidine]     Family History  Problem Relation Age of Onset   Cancer Mother        uterine cancer    Prior to Admission medications   Medication Sig Start Date End Date Taking? Authorizing  Provider  amitriptyline  (ELAVIL ) 10 MG tablet Take 20 mg by mouth at bedtime.    [provider]  amoxicillin  (AMOXIL ) 500 MG tablet Take 1 tablet (500 mg total) by mouth 2 (two) times daily. 11/10/24   Borders, Fonda SAUNDERS, NP  cetirizine (ZYRTEC) 5 MG tablet Take 5 mg by mouth.    [provider]  cyanocobalamin  (,VITAMIN B-12,) 1000 MCG/ML injection Inject into the muscle every 14 (fourteen) days. Next injection 4/16 07/04/21   [provider]   cyanocobalamin  (VITAMIN B12) 1000 MCG tablet Take 1,000 mcg by mouth daily.    [provider]  DULoxetine  (CYMBALTA ) 20 MG capsule Take 20 mg by mouth daily. 04/09/23   [provider]  Ferrous Sulfate 28 MG TABS Take 28 mg by mouth daily.    [provider]  furosemide  (LASIX ) 20 MG tablet TAKE TWO TABLETS (40 MG TOTAL) BY MOUTH DAILY. 11/07/24   [place] A, FNP  MELATONIN GUMMIES PO Take 3 mg by mouth at bedtime as needed.    [provider]  mometasone (NASONEX) 50 MCG/ACT nasal spray Place 2 sprays into the nose daily.    [provider]  naloxone  (NARCAN ) nasal spray 4 mg/0.1 mL SPRAY 1 SPRAY INTO ONE NOSTRIL AS DIRECTED FOR OPIOID OVERDOSE (TURN PERSON ON SIDE AFTER DOSE. IF NO RESPONSE IN 2-3 MINUTES OR PERSON RESPONDS BUT RELAPSES, REPEAT USING A NEW SPRAY DEVICE AND SPRAY INTO THE OTHER NOSTRIL. CALL 911 AFTER USE.) * EMERGENCY USE ONLY * 10/30/24   Borders, Fonda SAUNDERS, NP  ondansetron  (ZOFRAN ) 8 MG tablet TAKE ONE TABLET (8 MG TOTAL) BY MOUTH EVERY EIGHT HOURS AS NEEDED FOR NAUSEA OR VOMITING. 07/16/24   Borders, Fonda SAUNDERS, NP  oxyCODONE  ER (XTAMPZA  ER) 9 MG C12A TAKE 1 CAPSULE BY MOUTH EVERY 12 HOURS. 11/17/24   Borders, Fonda SAUNDERS, NP  oxyCODONE -acetaminophen  (PERCOCET/ROXICET) 5-325 MG tablet Take 1-2 tablets by mouth every 4 (four) hours as needed for severe pain (pain score 7-10). 11/17/24   Borders, Fonda SAUNDERS, NP  pantoprazole (PROTONIX) 40 MG tablet Take 40 mg by mouth daily. 09/15/24   [provider]  polyethylene glycol powder (GLYCOLAX /MIRALAX ) 17 GM/SCOOP powder Take 17 g by mouth 2 (two) times daily as needed for moderate constipation. 04/12/24   Dorothyann Drivers, MD  predniSONE  (DELTASONE ) 20 MG tablet TAKE 1 TABLET (20 MG TOTAL) BY MOUTH DAILY WITH BREAKFAST. 11/07/24   Borders, Fonda SAUNDERS, NP    Physical Exam: Vitals:   11/25/24 0032 11/25/24 0033 11/25/24 0100 11/25/24 0200  BP: 118/64   128/62  Pulse: (!) 102     Resp:     13  Temp: 97.9 F (36.6 C)     TempSrc: Oral     SpO2: (!) 89%  100% 100%  Weight:  44 kg    Height:  5' 3 (1.6 m)     Physical Exam Vitals and nursing note reviewed.  Constitutional:      General: She is not in acute distress.    Appearance: She is cachectic.     Comments: Very frail and underweight elderly female.  Mild dyspnea  HENT:     Head: Normocephalic and atraumatic.  Cardiovascular:     Rate and Rhythm: Regular rhythm. Tachycardia present.     Heart sounds: Normal heart sounds.  Pulmonary:     Breath sounds: Normal breath sounds.  Abdominal:     Palpations: Abdomen is soft.     Tenderness: There is no abdominal  tenderness.  Musculoskeletal:     Right lower leg: 3+ Edema present.     Left lower leg: 3+ Edema present.  Neurological:     Mental Status: Mental status is at baseline.     Labs on Admission: I have personally reviewed following labs and imaging studies  CBC: Recent Labs  Lab 11/25/24 0112  WBC 189.6*  NEUTROABS 6.7  HGB 9.5*  HCT 33.0*  MCV 104.4*  PLT 158   Basic Metabolic Panel: Recent Labs  Lab 11/25/24 0112  NA 136  K 4.7  CL 95*  CO2 23  GLUCOSE 125*  BUN 72*  CREATININE 2.05*  CALCIUM  8.4*   GFR: Estimated Creatinine Clearance: 13.2 mL/min (A) (by C-G formula based on SCr of 2.05 mg/dL (H)). Liver Function Tests: Recent Labs  Lab 11/25/24 0112  AST 31  ALT 29  ALKPHOS 167*  BILITOT 0.9  PROT 5.8*  ALBUMIN 4.1   No results for input(s): LIPASE, AMYLASE in the last 168 hours. No results for input(s): AMMONIA in the last 168 hours. Coagulation Profile: No results for input(s): INR, PROTIME in the last 168 hours. Cardiac Enzymes: No results for input(s): CKTOTAL, CKMB, CKMBINDEX, TROPONINI in the last 168 hours. BNP (last 3 results) Recent Labs    11/25/24 0112  PROBNP >35,000.0*   HbA1C: No results for input(s): HGBA1C in the last 72 hours. CBG: No results for input(s): GLUCAP in the  last 168 hours. Lipid Profile: No results for input(s): CHOL, HDL, LDLCALC, TRIG, CHOLHDL, LDLDIRECT in the last 72 hours. Thyroid Function Tests: No results for input(s): TSH, T4TOTAL, FREET4, T3FREE, THYROIDAB in the last 72 hours. Anemia Panel: No results for input(s): VITAMINB12, FOLATE, FERRITIN, TIBC, IRON, RETICCTPCT in the last 72 hours. Urine analysis:    Component Value Date/Time   COLORURINE YELLOW (A) 10/27/2024 1136   APPEARANCEUR CLEAR (A) 10/27/2024 1136   APPEARANCEUR Clear 03/06/2014 2053   LABSPEC 1.011 10/27/2024 1136   LABSPEC 1.008 03/06/2014 2053   PHURINE 5.0 10/27/2024 1136   GLUCOSEU NEGATIVE 10/27/2024 1136   GLUCOSEU Negative 03/06/2014 2053   HGBUR NEGATIVE 10/27/2024 1136   BILIRUBINUR NEGATIVE 10/27/2024 1136   BILIRUBINUR Negative 03/06/2014 2053   KETONESUR NEGATIVE 10/27/2024 1136   PROTEINUR NEGATIVE 10/27/2024 1136   NITRITE NEGATIVE 10/27/2024 1136   LEUKOCYTESUR NEGATIVE 10/27/2024 1136   LEUKOCYTESUR Negative 03/06/2014 2053    Radiological Exams on Admission: DG Chest Portable 1 View Result Date: 11/25/2024 EXAM: 1 VIEW(S) XRAY OF THE CHEST 11/25/2024 01:23:00 AM COMPARISON: 09/30/2024 CLINICAL HISTORY: Hypoxia FINDINGS: LUNGS AND PLEURA: Mild pulmonary edema. Left basilar airspace disease may reflect atelectasis or pneumonia. Small left pleural effusion. Right lower lobe nodule seen on prior x-ray not well visualized. No pneumothorax. HEART AND MEDIASTINUM: Cardiomegaly. Thoracic aortic calcifications. BONES AND SOFT TISSUES: No acute osseous abnormality. IMPRESSION: 1. Mild pulmonary edema. 2. Left basilar airspace disease, possibly atelectasis or pneumonia. 3. Small left pleural effusion. 4. Cardiomegaly. Electronically signed by: Morgane Naveau MD 11/25/2024 01:28 AM EST RP Workstation: [id number]   Data Reviewed for HPI: Relevant notes from primary care and specialist visits, past discharge summaries as  available in EHR, including Care Everywhere. Prior diagnostic testing as pertinent to current admission diagnoses Updated medications and problem lists for reconciliation ED course, including vitals, labs, imaging, treatment and response to treatment Triage notes, nursing and pharmacy notes and ED provider's notes Notable results as noted above in HPI      Assessment and Plan: * Acute on chronic systolic (  congestive) heart failure (HCC) Elevated troponin Troponin 120 and proBNP 35,000, chest x-ray with pulmonary edema No ischemic evaluation desired by family for elevated troponin in keeping with goals of care IV Lasix  for symptom control Daily weights with intake and output monitoring Supplemental oxygen for comfort  Acute on chronic cancer-related pain CLL with disease progression, off treatment due to intolerance Cancer related cachexia(protein calorie malnutrition), BMI 17.18 Awaiting hospice evaluation Continue home oxycodone  ER Morphine  for breakthrough Multimodal pain management Consider hospice eval while in house  Acute renal failure superimposed on stage 3b chronic kidney disease (HCC) Creatinine 2.05 up from baseline of 1.46 Expecting worsening with IV diuretics       DVT prophylaxis: Lovenox   Consults: none  Advance Care Planning:   Code Status: Prior   Family Communication: daughter at bedside  Disposition Plan: Back to previous home environment  Severity of Illness: The appropriate patient status for this patient is OBSERVATION. Observation status is judged to be reasonable and necessary in order to provide the required intensity of service to ensure the patient's safety. The patient's presenting symptoms, physical exam findings, and initial radiographic and laboratory data in the context of their medical condition is felt to place them at decreased risk for further clinical deterioration. Furthermore, it is anticipated that the patient will be medically  stable for discharge from the hospital within 2 midnights of admission.   Author: Delayne LULLA Solian, MD 11/25/2024 2:59 AM  For on call review [email or web address].

## 2024-11-25 NOTE — ED Notes (Signed)
 [facility]  Liaison called  [facility] for  transport  to  Hospice home

## 2024-11-25 NOTE — Telephone Encounter (Signed)
 Patient currently admitted to the emergency room-for intractable pain.   Discussed with patient granddaughter Chelsea-recommend hospice for symptom control, interested in inpatient hospice facility.  Discussed with Dr. Sherleen.  GB

## 2024-11-26 ENCOUNTER — Inpatient Hospital Stay

## 2024-11-28 ENCOUNTER — Ambulatory Visit: Admitting: Podiatry

## 2024-11-28 DIAGNOSIS — Z91199 Patient's noncompliance with other medical treatment and regimen due to unspecified reason: Secondary | ICD-10-CM

## 2024-11-28 NOTE — Progress Notes (Signed)
 1. No-show for appointment

## 2024-12-18 DEATH — deceased

## 2025-01-08 ENCOUNTER — Ambulatory Visit: Admitting: Family
